# Patient Record
Sex: Female | Born: 1960 | Race: Black or African American | Hispanic: No | Marital: Single | Smoking: Former smoker
Health system: Southern US, Community
[De-identification: ages and names within clinical notes are randomized; demographics above are authoritative.]

## PROBLEM LIST (undated history)

## (undated) DIAGNOSIS — M549 Dorsalgia, unspecified: Secondary | ICD-10-CM

## (undated) DIAGNOSIS — G8929 Other chronic pain: Secondary | ICD-10-CM

## (undated) DIAGNOSIS — F1910 Other psychoactive substance abuse, uncomplicated: Secondary | ICD-10-CM

## (undated) DIAGNOSIS — E669 Obesity, unspecified: Secondary | ICD-10-CM

## (undated) DIAGNOSIS — R202 Paresthesia of skin: Secondary | ICD-10-CM

## (undated) DIAGNOSIS — F419 Anxiety disorder, unspecified: Secondary | ICD-10-CM

## (undated) DIAGNOSIS — E785 Hyperlipidemia, unspecified: Secondary | ICD-10-CM

## (undated) DIAGNOSIS — IMO0002 Reserved for concepts with insufficient information to code with codable children: Secondary | ICD-10-CM

## (undated) DIAGNOSIS — D649 Anemia, unspecified: Secondary | ICD-10-CM

## (undated) DIAGNOSIS — F32A Depression, unspecified: Secondary | ICD-10-CM

## (undated) DIAGNOSIS — L309 Dermatitis, unspecified: Secondary | ICD-10-CM

## (undated) DIAGNOSIS — M1990 Unspecified osteoarthritis, unspecified site: Secondary | ICD-10-CM

## (undated) DIAGNOSIS — M1711 Unilateral primary osteoarthritis, right knee: Secondary | ICD-10-CM

## (undated) DIAGNOSIS — F329 Major depressive disorder, single episode, unspecified: Secondary | ICD-10-CM

## (undated) HISTORY — DX: Reserved for concepts with insufficient information to code with codable children: IMO0002

## (undated) HISTORY — PX: ANKLE FRACTURE SURGERY: SHX122

## (undated) HISTORY — DX: Unspecified osteoarthritis, unspecified site: M19.90

## (undated) HISTORY — DX: Obesity, unspecified: E66.9

## (undated) HISTORY — DX: Major depressive disorder, single episode, unspecified: F32.9

## (undated) HISTORY — DX: Dermatitis, unspecified: L30.9

## (undated) HISTORY — DX: Depression, unspecified: F32.A

## (undated) HISTORY — DX: Anemia, unspecified: D64.9

## (undated) HISTORY — DX: Anxiety disorder, unspecified: F41.9

## (undated) HISTORY — DX: Dorsalgia, unspecified: M54.9

## (undated) HISTORY — DX: Hyperlipidemia, unspecified: E78.5

## (undated) HISTORY — DX: Other chronic pain: G89.29

## (undated) HISTORY — DX: Other psychoactive substance abuse, uncomplicated: F19.10

---

## 1998-02-12 ENCOUNTER — Ambulatory Visit (HOSPITAL_COMMUNITY): Admission: RE | Admit: 1998-02-12 | Discharge: 1998-02-12 | Payer: Self-pay | Admitting: *Deleted

## 1998-07-18 ENCOUNTER — Inpatient Hospital Stay (HOSPITAL_COMMUNITY): Admission: AD | Admit: 1998-07-18 | Discharge: 1998-07-21 | Payer: Self-pay | Admitting: *Deleted

## 1998-11-25 ENCOUNTER — Encounter: Payer: Self-pay | Admitting: Emergency Medicine

## 1998-11-25 ENCOUNTER — Emergency Department (HOSPITAL_COMMUNITY): Admission: EM | Admit: 1998-11-25 | Discharge: 1998-11-25 | Payer: Self-pay | Admitting: Emergency Medicine

## 2002-02-20 ENCOUNTER — Emergency Department (HOSPITAL_COMMUNITY): Admission: EM | Admit: 2002-02-20 | Discharge: 2002-02-20 | Payer: Self-pay | Admitting: Emergency Medicine

## 2002-02-20 ENCOUNTER — Encounter: Payer: Self-pay | Admitting: Emergency Medicine

## 2002-12-19 ENCOUNTER — Other Ambulatory Visit: Admission: RE | Admit: 2002-12-19 | Discharge: 2002-12-19 | Payer: Self-pay | Admitting: Nephrology

## 2003-12-20 ENCOUNTER — Encounter: Admission: RE | Admit: 2003-12-20 | Discharge: 2003-12-20 | Payer: Self-pay | Admitting: Nephrology

## 2004-05-23 ENCOUNTER — Encounter: Admission: RE | Admit: 2004-05-23 | Discharge: 2004-05-23 | Payer: Self-pay | Admitting: Family Medicine

## 2004-05-23 ENCOUNTER — Other Ambulatory Visit: Admission: RE | Admit: 2004-05-23 | Discharge: 2004-05-23 | Payer: Self-pay | Admitting: Family Medicine

## 2004-06-03 ENCOUNTER — Encounter: Admission: RE | Admit: 2004-06-03 | Discharge: 2004-06-03 | Payer: Self-pay | Admitting: Sports Medicine

## 2004-07-30 ENCOUNTER — Emergency Department (HOSPITAL_COMMUNITY): Admission: EM | Admit: 2004-07-30 | Discharge: 2004-07-30 | Payer: Self-pay | Admitting: Emergency Medicine

## 2004-08-01 ENCOUNTER — Ambulatory Visit: Payer: Self-pay | Admitting: Sports Medicine

## 2004-09-10 ENCOUNTER — Ambulatory Visit: Payer: Self-pay | Admitting: Family Medicine

## 2004-09-18 ENCOUNTER — Ambulatory Visit: Payer: Self-pay | Admitting: Family Medicine

## 2004-10-20 ENCOUNTER — Ambulatory Visit: Payer: Self-pay | Admitting: Family Medicine

## 2004-12-12 ENCOUNTER — Ambulatory Visit: Payer: Self-pay | Admitting: Family Medicine

## 2005-01-14 ENCOUNTER — Ambulatory Visit: Payer: Self-pay | Admitting: Family Medicine

## 2005-02-26 ENCOUNTER — Ambulatory Visit: Payer: Self-pay | Admitting: Family Medicine

## 2005-05-12 ENCOUNTER — Encounter (INDEPENDENT_AMBULATORY_CARE_PROVIDER_SITE_OTHER): Payer: Self-pay | Admitting: *Deleted

## 2005-05-12 LAB — CONVERTED CEMR LAB

## 2005-05-13 ENCOUNTER — Ambulatory Visit: Payer: Self-pay | Admitting: Sports Medicine

## 2005-06-10 ENCOUNTER — Other Ambulatory Visit: Admission: RE | Admit: 2005-06-10 | Discharge: 2005-06-10 | Payer: Self-pay | Admitting: Family Medicine

## 2005-06-10 ENCOUNTER — Ambulatory Visit: Payer: Self-pay | Admitting: Family Medicine

## 2005-07-03 ENCOUNTER — Ambulatory Visit: Payer: Self-pay | Admitting: Family Medicine

## 2005-08-05 ENCOUNTER — Ambulatory Visit: Payer: Self-pay | Admitting: Family Medicine

## 2005-08-19 ENCOUNTER — Encounter: Admission: RE | Admit: 2005-08-19 | Discharge: 2005-08-19 | Payer: Self-pay | Admitting: Sports Medicine

## 2005-10-10 ENCOUNTER — Emergency Department (HOSPITAL_COMMUNITY): Admission: EM | Admit: 2005-10-10 | Discharge: 2005-10-10 | Payer: Self-pay | Admitting: Family Medicine

## 2005-12-08 ENCOUNTER — Ambulatory Visit: Payer: Self-pay | Admitting: Family Medicine

## 2006-02-10 ENCOUNTER — Encounter: Payer: Self-pay | Admitting: *Deleted

## 2006-03-03 ENCOUNTER — Ambulatory Visit: Payer: Self-pay | Admitting: Family Medicine

## 2006-04-15 ENCOUNTER — Ambulatory Visit: Payer: Self-pay | Admitting: Family Medicine

## 2006-04-26 ENCOUNTER — Ambulatory Visit: Payer: Self-pay | Admitting: Family Medicine

## 2006-05-12 ENCOUNTER — Ambulatory Visit: Payer: Self-pay | Admitting: Family Medicine

## 2006-06-02 ENCOUNTER — Ambulatory Visit: Payer: Self-pay | Admitting: Sports Medicine

## 2006-08-17 ENCOUNTER — Ambulatory Visit: Payer: Self-pay | Admitting: Family Medicine

## 2006-12-09 DIAGNOSIS — D649 Anemia, unspecified: Secondary | ICD-10-CM

## 2006-12-09 DIAGNOSIS — E7800 Pure hypercholesterolemia, unspecified: Secondary | ICD-10-CM

## 2006-12-09 DIAGNOSIS — K649 Unspecified hemorrhoids: Secondary | ICD-10-CM | POA: Insufficient documentation

## 2006-12-10 ENCOUNTER — Encounter (INDEPENDENT_AMBULATORY_CARE_PROVIDER_SITE_OTHER): Payer: Self-pay | Admitting: *Deleted

## 2007-02-11 ENCOUNTER — Emergency Department (HOSPITAL_COMMUNITY): Admission: EM | Admit: 2007-02-11 | Discharge: 2007-02-11 | Payer: Self-pay | Admitting: Emergency Medicine

## 2007-03-10 ENCOUNTER — Ambulatory Visit: Payer: Self-pay | Admitting: Family Medicine

## 2007-03-10 ENCOUNTER — Other Ambulatory Visit: Admission: RE | Admit: 2007-03-10 | Discharge: 2007-03-10 | Payer: Self-pay | Admitting: Family Medicine

## 2007-03-10 ENCOUNTER — Encounter (INDEPENDENT_AMBULATORY_CARE_PROVIDER_SITE_OTHER): Payer: Self-pay | Admitting: Family Medicine

## 2007-03-10 DIAGNOSIS — F32A Depression, unspecified: Secondary | ICD-10-CM | POA: Insufficient documentation

## 2007-03-10 DIAGNOSIS — F329 Major depressive disorder, single episode, unspecified: Secondary | ICD-10-CM

## 2007-03-14 ENCOUNTER — Encounter (INDEPENDENT_AMBULATORY_CARE_PROVIDER_SITE_OTHER): Payer: Self-pay | Admitting: Family Medicine

## 2007-03-14 ENCOUNTER — Ambulatory Visit: Payer: Self-pay | Admitting: *Deleted

## 2007-03-14 LAB — CONVERTED CEMR LAB
Cholesterol: 191 mg/dL (ref 0–200)
LDL Cholesterol: 120 mg/dL — ABNORMAL HIGH (ref 0–99)
VLDL: 19 mg/dL (ref 0–40)

## 2007-03-22 ENCOUNTER — Encounter (INDEPENDENT_AMBULATORY_CARE_PROVIDER_SITE_OTHER): Payer: Self-pay | Admitting: Family Medicine

## 2007-05-16 ENCOUNTER — Telehealth: Payer: Self-pay | Admitting: *Deleted

## 2007-05-17 ENCOUNTER — Ambulatory Visit: Payer: Self-pay | Admitting: Family Medicine

## 2007-08-24 ENCOUNTER — Ambulatory Visit: Payer: Self-pay | Admitting: Family Medicine

## 2007-08-24 ENCOUNTER — Telehealth: Payer: Self-pay | Admitting: *Deleted

## 2007-08-25 ENCOUNTER — Encounter (INDEPENDENT_AMBULATORY_CARE_PROVIDER_SITE_OTHER): Payer: Self-pay | Admitting: *Deleted

## 2007-08-31 ENCOUNTER — Telehealth: Payer: Self-pay | Admitting: *Deleted

## 2008-02-06 ENCOUNTER — Telehealth: Payer: Self-pay | Admitting: *Deleted

## 2008-02-25 ENCOUNTER — Emergency Department (HOSPITAL_COMMUNITY): Admission: EM | Admit: 2008-02-25 | Discharge: 2008-02-25 | Payer: Self-pay | Admitting: Emergency Medicine

## 2008-02-27 ENCOUNTER — Encounter (INDEPENDENT_AMBULATORY_CARE_PROVIDER_SITE_OTHER): Payer: Self-pay | Admitting: Family Medicine

## 2008-02-27 ENCOUNTER — Ambulatory Visit: Payer: Self-pay | Admitting: Family Medicine

## 2008-02-28 ENCOUNTER — Telehealth (INDEPENDENT_AMBULATORY_CARE_PROVIDER_SITE_OTHER): Payer: Self-pay | Admitting: *Deleted

## 2008-02-28 ENCOUNTER — Encounter: Admission: RE | Admit: 2008-02-28 | Discharge: 2008-02-28 | Payer: Self-pay | Admitting: Family Medicine

## 2008-03-01 ENCOUNTER — Telehealth (INDEPENDENT_AMBULATORY_CARE_PROVIDER_SITE_OTHER): Payer: Self-pay | Admitting: Family Medicine

## 2008-03-06 ENCOUNTER — Telehealth (INDEPENDENT_AMBULATORY_CARE_PROVIDER_SITE_OTHER): Payer: Self-pay | Admitting: Family Medicine

## 2008-03-29 ENCOUNTER — Encounter: Payer: Self-pay | Admitting: *Deleted

## 2008-03-29 ENCOUNTER — Encounter (INDEPENDENT_AMBULATORY_CARE_PROVIDER_SITE_OTHER): Payer: Self-pay | Admitting: Family Medicine

## 2008-03-29 ENCOUNTER — Ambulatory Visit: Payer: Self-pay | Admitting: Family Medicine

## 2008-04-02 ENCOUNTER — Encounter (INDEPENDENT_AMBULATORY_CARE_PROVIDER_SITE_OTHER): Payer: Self-pay | Admitting: *Deleted

## 2008-04-02 ENCOUNTER — Telehealth: Payer: Self-pay | Admitting: *Deleted

## 2008-04-02 LAB — CONVERTED CEMR LAB
Ferritin: 8 ng/mL — ABNORMAL LOW (ref 10–291)
HCT: 34.7 % — ABNORMAL LOW (ref 36.0–46.0)
MCHC: 29.1 g/dL — ABNORMAL LOW (ref 30.0–36.0)
Platelets: 387 10*3/uL (ref 150–400)
RBC: 4.44 M/uL (ref 3.87–5.11)
TIBC: 393 ug/dL (ref 250–470)

## 2008-05-03 ENCOUNTER — Telehealth: Payer: Self-pay | Admitting: *Deleted

## 2008-05-10 ENCOUNTER — Other Ambulatory Visit: Admission: RE | Admit: 2008-05-10 | Discharge: 2008-05-10 | Payer: Self-pay | Admitting: Obstetrics and Gynecology

## 2008-05-10 ENCOUNTER — Encounter (INDEPENDENT_AMBULATORY_CARE_PROVIDER_SITE_OTHER): Payer: Self-pay | Admitting: Family Medicine

## 2008-05-10 ENCOUNTER — Ambulatory Visit: Payer: Self-pay | Admitting: Obstetrics and Gynecology

## 2008-05-18 ENCOUNTER — Ambulatory Visit: Payer: Self-pay | Admitting: Family Medicine

## 2008-05-18 DIAGNOSIS — L309 Dermatitis, unspecified: Secondary | ICD-10-CM | POA: Insufficient documentation

## 2008-05-25 ENCOUNTER — Ambulatory Visit: Payer: Self-pay | Admitting: Obstetrics & Gynecology

## 2008-05-25 ENCOUNTER — Encounter (INDEPENDENT_AMBULATORY_CARE_PROVIDER_SITE_OTHER): Payer: Self-pay | Admitting: Family Medicine

## 2008-05-30 ENCOUNTER — Ambulatory Visit: Payer: Self-pay | Admitting: Family Medicine

## 2008-05-30 ENCOUNTER — Encounter (INDEPENDENT_AMBULATORY_CARE_PROVIDER_SITE_OTHER): Payer: Self-pay | Admitting: Family Medicine

## 2008-06-01 ENCOUNTER — Encounter (INDEPENDENT_AMBULATORY_CARE_PROVIDER_SITE_OTHER): Payer: Self-pay | Admitting: Family Medicine

## 2008-06-01 LAB — CONVERTED CEMR LAB
CO2: 21 meq/L (ref 19–32)
Calcium: 8.3 mg/dL — ABNORMAL LOW (ref 8.4–10.5)
Chloride: 108 meq/L (ref 96–112)
Cholesterol: 180 mg/dL (ref 0–200)
Creatinine, Ser: 0.52 mg/dL (ref 0.40–1.20)
HDL: 43 mg/dL (ref >39)
LDL Cholesterol: 122 mg/dL — ABNORMAL HIGH (ref 0–99)
Platelets: 305 10*3/uL (ref 150–400)
Potassium: 4.7 meq/L (ref 3.5–5.3)
Sodium: 139 meq/L (ref 135–145)
Triglycerides: 73 mg/dL (ref <150)
WBC: 4.9 10*3/uL (ref 4.0–10.5)

## 2008-08-31 ENCOUNTER — Ambulatory Visit: Payer: Self-pay | Admitting: Obstetrics & Gynecology

## 2008-08-31 ENCOUNTER — Encounter (INDEPENDENT_AMBULATORY_CARE_PROVIDER_SITE_OTHER): Payer: Self-pay | Admitting: Obstetrics & Gynecology

## 2008-09-25 ENCOUNTER — Ambulatory Visit: Payer: Self-pay | Admitting: Family Medicine

## 2008-10-18 ENCOUNTER — Telehealth: Payer: Self-pay | Admitting: *Deleted

## 2008-10-25 ENCOUNTER — Encounter (INDEPENDENT_AMBULATORY_CARE_PROVIDER_SITE_OTHER): Payer: Self-pay | Admitting: Family Medicine

## 2008-10-25 ENCOUNTER — Ambulatory Visit: Payer: Self-pay | Admitting: Family Medicine

## 2008-10-25 LAB — CONVERTED CEMR LAB: TSH: 1.303 microintl units/mL (ref 0.350–4.50)

## 2008-11-01 ENCOUNTER — Ambulatory Visit: Payer: Self-pay | Admitting: Obstetrics and Gynecology

## 2008-11-12 ENCOUNTER — Telehealth: Payer: Self-pay | Admitting: *Deleted

## 2008-11-13 ENCOUNTER — Encounter: Payer: Self-pay | Admitting: *Deleted

## 2008-11-14 ENCOUNTER — Ambulatory Visit: Payer: Self-pay | Admitting: Family Medicine

## 2008-11-14 DIAGNOSIS — F411 Generalized anxiety disorder: Secondary | ICD-10-CM | POA: Insufficient documentation

## 2008-12-11 ENCOUNTER — Telehealth: Payer: Self-pay | Admitting: *Deleted

## 2008-12-18 ENCOUNTER — Ambulatory Visit: Payer: Self-pay | Admitting: Obstetrics & Gynecology

## 2008-12-18 ENCOUNTER — Ambulatory Visit (HOSPITAL_COMMUNITY): Admission: RE | Admit: 2008-12-18 | Discharge: 2008-12-18 | Payer: Self-pay | Admitting: Obstetrics & Gynecology

## 2008-12-28 ENCOUNTER — Ambulatory Visit: Payer: Self-pay | Admitting: Family Medicine

## 2008-12-31 ENCOUNTER — Ambulatory Visit: Payer: Self-pay | Admitting: Physician Assistant

## 2008-12-31 ENCOUNTER — Inpatient Hospital Stay (HOSPITAL_COMMUNITY): Admission: AD | Admit: 2008-12-31 | Discharge: 2008-12-31 | Payer: Self-pay | Admitting: Obstetrics & Gynecology

## 2009-01-03 ENCOUNTER — Ambulatory Visit: Payer: Self-pay | Admitting: Family Medicine

## 2009-01-23 ENCOUNTER — Ambulatory Visit: Payer: Self-pay | Admitting: Obstetrics and Gynecology

## 2009-01-30 ENCOUNTER — Telehealth: Payer: Self-pay | Admitting: Family Medicine

## 2009-02-05 ENCOUNTER — Ambulatory Visit: Payer: Self-pay | Admitting: Family Medicine

## 2009-02-12 ENCOUNTER — Ambulatory Visit: Payer: Self-pay | Admitting: Family Medicine

## 2009-02-20 ENCOUNTER — Ambulatory Visit: Payer: Self-pay | Admitting: Obstetrics and Gynecology

## 2009-02-20 LAB — CONVERTED CEMR LAB
HCT: 33 % — ABNORMAL LOW (ref 36.0–46.0)
MCHC: 31.5 g/dL (ref 30.0–36.0)
MCV: 79.5 fL (ref 78.0–100.0)

## 2009-03-18 ENCOUNTER — Encounter (INDEPENDENT_AMBULATORY_CARE_PROVIDER_SITE_OTHER): Payer: Self-pay | Admitting: Family Medicine

## 2009-03-18 ENCOUNTER — Ambulatory Visit: Payer: Self-pay | Admitting: Family Medicine

## 2009-03-18 LAB — CONVERTED CEMR LAB
HCT: 29.7 % — ABNORMAL LOW (ref 36.0–46.0)
Hemoglobin: 9.1 g/dL — ABNORMAL LOW (ref 12.0–15.0)
MCHC: 30.6 g/dL (ref 30.0–36.0)
RBC: 3.81 M/uL — ABNORMAL LOW (ref 3.87–5.11)

## 2009-03-19 ENCOUNTER — Ambulatory Visit: Payer: Self-pay | Admitting: Family Medicine

## 2009-03-21 ENCOUNTER — Encounter: Payer: Self-pay | Admitting: Family Medicine

## 2009-03-21 ENCOUNTER — Inpatient Hospital Stay (HOSPITAL_COMMUNITY): Admission: AD | Admit: 2009-03-21 | Discharge: 2009-03-21 | Payer: Self-pay | Admitting: Obstetrics & Gynecology

## 2009-03-22 ENCOUNTER — Ambulatory Visit: Payer: Self-pay | Admitting: Family Medicine

## 2009-03-22 DIAGNOSIS — K219 Gastro-esophageal reflux disease without esophagitis: Secondary | ICD-10-CM

## 2009-03-28 ENCOUNTER — Encounter (INDEPENDENT_AMBULATORY_CARE_PROVIDER_SITE_OTHER): Payer: Self-pay | Admitting: Family Medicine

## 2009-03-29 ENCOUNTER — Ambulatory Visit: Payer: Self-pay | Admitting: Obstetrics & Gynecology

## 2009-04-18 ENCOUNTER — Ambulatory Visit: Payer: Self-pay | Admitting: Obstetrics & Gynecology

## 2009-04-18 ENCOUNTER — Other Ambulatory Visit: Admission: RE | Admit: 2009-04-18 | Discharge: 2009-04-18 | Payer: Self-pay | Admitting: Obstetrics & Gynecology

## 2009-04-24 ENCOUNTER — Ambulatory Visit (HOSPITAL_COMMUNITY): Admission: RE | Admit: 2009-04-24 | Discharge: 2009-04-24 | Payer: Self-pay | Admitting: Family Medicine

## 2009-04-30 ENCOUNTER — Ambulatory Visit: Payer: Self-pay | Admitting: Family Medicine

## 2009-05-02 ENCOUNTER — Ambulatory Visit: Payer: Self-pay | Admitting: Obstetrics and Gynecology

## 2009-05-09 ENCOUNTER — Ambulatory Visit: Payer: Self-pay | Admitting: Family Medicine

## 2009-05-13 ENCOUNTER — Ambulatory Visit: Payer: Self-pay | Admitting: Family Medicine

## 2009-05-16 ENCOUNTER — Ambulatory Visit: Payer: Self-pay | Admitting: Obstetrics & Gynecology

## 2009-05-21 ENCOUNTER — Ambulatory Visit: Payer: Self-pay | Admitting: Family Medicine

## 2009-06-19 ENCOUNTER — Ambulatory Visit: Payer: Self-pay | Admitting: Family Medicine

## 2009-07-23 ENCOUNTER — Ambulatory Visit: Payer: Self-pay | Admitting: Family Medicine

## 2009-07-23 ENCOUNTER — Encounter: Payer: Self-pay | Admitting: Family Medicine

## 2009-07-23 LAB — CONVERTED CEMR LAB
MCV: 80 fL (ref 78.0–100.0)
Platelets: 404 10*3/uL — ABNORMAL HIGH (ref 150–400)
RBC: 4.3 M/uL (ref 3.87–5.11)

## 2009-07-25 ENCOUNTER — Telehealth: Payer: Self-pay | Admitting: *Deleted

## 2009-08-15 ENCOUNTER — Ambulatory Visit: Payer: Self-pay | Admitting: Obstetrics & Gynecology

## 2009-08-20 ENCOUNTER — Ambulatory Visit (HOSPITAL_COMMUNITY): Admission: RE | Admit: 2009-08-20 | Discharge: 2009-08-20 | Payer: Self-pay | Admitting: Family Medicine

## 2009-09-12 ENCOUNTER — Ambulatory Visit: Payer: Self-pay | Admitting: Obstetrics & Gynecology

## 2009-10-03 ENCOUNTER — Ambulatory Visit (HOSPITAL_COMMUNITY): Admission: RE | Admit: 2009-10-03 | Discharge: 2009-10-03 | Payer: Self-pay | Admitting: Obstetrics & Gynecology

## 2009-10-03 ENCOUNTER — Ambulatory Visit: Payer: Self-pay | Admitting: Obstetrics & Gynecology

## 2009-10-12 HISTORY — PX: ABDOMINAL HYSTERECTOMY: SHX81

## 2009-12-06 ENCOUNTER — Ambulatory Visit: Payer: Self-pay | Admitting: Obstetrics & Gynecology

## 2009-12-11 ENCOUNTER — Ambulatory Visit: Payer: Self-pay | Admitting: Family Medicine

## 2009-12-11 ENCOUNTER — Encounter: Payer: Self-pay | Admitting: Family Medicine

## 2009-12-11 LAB — CONVERTED CEMR LAB
Hemoglobin: 12.2 g/dL (ref 12.0–15.0)
Iron: 36 ug/dL — ABNORMAL LOW (ref 42–145)
MCHC: 31.3 g/dL (ref 30.0–36.0)
MCV: 84.2 fL (ref 78.0–100.0)
Platelets: 328 10*3/uL (ref 150–400)
RDW: 17 % — ABNORMAL HIGH (ref 11.5–15.5)
Saturation Ratios: 11 % — ABNORMAL LOW (ref 20–55)
TIBC: 318 ug/dL (ref 250–470)
UIBC: 282 ug/dL
WBC: 5.7 10*3/uL (ref 4.0–10.5)

## 2009-12-13 ENCOUNTER — Telehealth: Payer: Self-pay | Admitting: *Deleted

## 2010-01-02 ENCOUNTER — Telehealth: Payer: Self-pay | Admitting: Family Medicine

## 2010-01-02 ENCOUNTER — Emergency Department (HOSPITAL_COMMUNITY): Admission: EM | Admit: 2010-01-02 | Discharge: 2010-01-02 | Payer: Self-pay | Admitting: Emergency Medicine

## 2010-01-08 ENCOUNTER — Ambulatory Visit: Payer: Self-pay | Admitting: Family Medicine

## 2010-01-08 ENCOUNTER — Encounter: Admission: RE | Admit: 2010-01-08 | Discharge: 2010-01-08 | Payer: Self-pay | Admitting: Family Medicine

## 2010-01-09 ENCOUNTER — Telehealth: Payer: Self-pay | Admitting: Family Medicine

## 2010-01-09 ENCOUNTER — Encounter: Payer: Self-pay | Admitting: *Deleted

## 2010-01-09 ENCOUNTER — Telehealth: Payer: Self-pay | Admitting: *Deleted

## 2010-01-17 ENCOUNTER — Telehealth: Payer: Self-pay | Admitting: Family Medicine

## 2010-02-07 ENCOUNTER — Ambulatory Visit: Payer: Self-pay | Admitting: Obstetrics & Gynecology

## 2010-02-10 ENCOUNTER — Telehealth: Payer: Self-pay | Admitting: Family Medicine

## 2010-02-12 ENCOUNTER — Ambulatory Visit (HOSPITAL_COMMUNITY): Admission: RE | Admit: 2010-02-12 | Discharge: 2010-02-12 | Payer: Self-pay | Admitting: Obstetrics & Gynecology

## 2010-02-18 ENCOUNTER — Telehealth: Payer: Self-pay | Admitting: Family Medicine

## 2010-02-27 ENCOUNTER — Ambulatory Visit: Payer: Self-pay | Admitting: Obstetrics & Gynecology

## 2010-03-04 ENCOUNTER — Telehealth: Payer: Self-pay | Admitting: Family Medicine

## 2010-03-05 ENCOUNTER — Ambulatory Visit: Payer: Self-pay | Admitting: Family Medicine

## 2010-03-12 ENCOUNTER — Ambulatory Visit: Payer: Self-pay | Admitting: Family Medicine

## 2010-03-13 ENCOUNTER — Ambulatory Visit (HOSPITAL_COMMUNITY): Admission: RE | Admit: 2010-03-13 | Discharge: 2010-03-13 | Payer: Self-pay | Admitting: Obstetrics & Gynecology

## 2010-03-31 ENCOUNTER — Ambulatory Visit (HOSPITAL_COMMUNITY): Admission: RE | Admit: 2010-03-31 | Discharge: 2010-03-31 | Payer: Self-pay | Admitting: Family Medicine

## 2010-03-31 ENCOUNTER — Ambulatory Visit: Payer: Self-pay | Admitting: Family Medicine

## 2010-04-04 ENCOUNTER — Encounter: Payer: Self-pay | Admitting: Family Medicine

## 2010-05-09 ENCOUNTER — Ambulatory Visit: Payer: Self-pay | Admitting: Obstetrics and Gynecology

## 2010-05-12 ENCOUNTER — Telehealth: Payer: Self-pay | Admitting: *Deleted

## 2010-06-03 ENCOUNTER — Other Ambulatory Visit: Payer: Self-pay | Admitting: Obstetrics & Gynecology

## 2010-06-03 ENCOUNTER — Ambulatory Visit (HOSPITAL_COMMUNITY): Admission: RE | Admit: 2010-06-03 | Discharge: 2010-06-03 | Payer: Self-pay | Admitting: Obstetrics & Gynecology

## 2010-06-10 ENCOUNTER — Ambulatory Visit: Payer: Self-pay | Admitting: Obstetrics & Gynecology

## 2010-06-10 ENCOUNTER — Encounter: Payer: Self-pay | Admitting: Obstetrics & Gynecology

## 2010-06-10 ENCOUNTER — Inpatient Hospital Stay (HOSPITAL_COMMUNITY): Admission: RE | Admit: 2010-06-10 | Discharge: 2010-06-11 | Payer: Self-pay | Admitting: Obstetrics & Gynecology

## 2010-06-10 ENCOUNTER — Encounter: Payer: Self-pay | Admitting: Family Medicine

## 2010-06-19 ENCOUNTER — Inpatient Hospital Stay (HOSPITAL_COMMUNITY): Admission: AD | Admit: 2010-06-19 | Discharge: 2010-06-19 | Payer: Self-pay | Admitting: Obstetrics & Gynecology

## 2010-07-03 ENCOUNTER — Ambulatory Visit: Payer: Self-pay | Admitting: Obstetrics & Gynecology

## 2010-07-04 ENCOUNTER — Encounter: Payer: Self-pay | Admitting: Obstetrics & Gynecology

## 2010-07-24 ENCOUNTER — Ambulatory Visit: Payer: Self-pay | Admitting: Obstetrics and Gynecology

## 2010-08-29 ENCOUNTER — Ambulatory Visit: Payer: Self-pay | Admitting: Family Medicine

## 2010-08-29 DIAGNOSIS — H9209 Otalgia, unspecified ear: Secondary | ICD-10-CM | POA: Insufficient documentation

## 2010-09-02 DIAGNOSIS — R109 Unspecified abdominal pain: Secondary | ICD-10-CM | POA: Insufficient documentation

## 2010-11-02 ENCOUNTER — Encounter: Payer: Self-pay | Admitting: Obstetrics & Gynecology

## 2010-11-13 NOTE — Progress Notes (Signed)
Summary: pt request GYN referral/TS   Phone Note Call from Patient Call back at Home Phone [phone]   Caller: Patient Summary of Call: Pt returning phone call from Dr. Jeanice Lim. Initial call taken by: Clydell Hakim,  January 09, 2010 4:07 PM  Follow-up for Phone Call        called pt and explained results of CT scan. pt wants to go to Gyn doctor and discuss hysterectomy. c/o abdominal bloating, bleeding and feels like she needs hysterectomy. I told the pt, that we would fax the information to [facility] and they will sched. appt for her. pt agreed. fwd. to Dr.[place] to put in order for GYN referral. Follow-up by: Arlyss Repress CMA,,  January 10, 2010 9:15 AM  Additional Follow-up for Phone Call Additional follow up Details #1::        pt does not need a gyn referral is [facility], she just needs to call for an appt Additional Follow-up by: [name] MD,  January 10, 2010 9:19 AM    Additional Follow-up for Phone Call Additional follow up Details #2::    called pt and lmvm to call [facility] and sched. appt. see dr.'s note. Follow-up by: Arlyss Repress CMA,,  January 10, 2010 11:50 AM  Additional Follow-up for Phone Call Additional follow up Details #3:: Details for Additional Follow-up Action Taken: called pt. pt has upcoming appt at [facility] on 02-07-10. Additional Follow-up by: Arlyss Repress CMA,,  January 13, 2010 10:53 AM

## 2010-11-13 NOTE — Assessment & Plan Note (Signed)
Summary: BACK PAIN [facility]   Vital Signs:  Patient profile:   50 year old female Weight:      258 pounds BMI:     49.74 BP sitting:   124 / 82  (right arm)  Vitals Entered By: Starleen Blue RN (December 11, 2009 8:37 AM) CC: back pain/ anemia   Primary Care Provider:  [name] MD  CC:  back pain/ anemia.  History of Present Illness:   50y.o female , presents with chronic low back and abd pain.   Back pain/and pain - pt with well known history of uterine fibroids and DUB, s/p endometrial ablation in March 2010 and in Dec 2010 with removal of submucasal fibroid  . Continues to have heavy, painful menstrual cycles monthly with bloating. Abd pain decribed as crampy pain mostly in lower quadrants which occurs during menstruation, no radiation however with associated backache. Pain pain is more of side pain below her pannus. Ibuprofen helps mediate most symptoms , has also tried Excedrin with little releif. Pt described epsiodes to GYN doctor but they told her to come back here. At this time they are to hold on TAH  ROS- denies dysuria, hematuria, frequency, N/V, diarrhea , no paresthesia of lower ext, no change in bladder function LMP Week of Feb 21   Habits & Providers  Alcohol-Tobacco-Diet     Tobacco Status: current  Allergies: No Known Drug Allergies  Social History: Smoking Status:  current  Review of Systems       per HPI  Physical Exam  General:  Well-developed,obese,in no acute distress; alert,appropriate and cooperative throughout examination.  Vitals reviewed.  Abdomen:  +BS, Soft, NT, non-distended, obese, no mass felt however pt with large amount of adipose tissue  No CVA tenderness  Msk:  TTP beneath pannus on left and right sides/ lateral aspect spine non tender no redness normal squal, pulling sensation with flexion at back 45degrees from standing No pain with inveriosn or eversion or hip right or left neg straight leg test Hip at same height no pain  with palpation of iliac crest Neurologic:  Gait normal Motor equal bilat normal sensation DTR lower ext equal bilat   Impression & Recommendations:  Problem # 1:  BACK PAIN, CHRONIC (ICD-724.5) Assessment Unchanged  I beleive this is menorrhagia associated with uterine fibrods as well as some MSK complaint, no red flag on exam. As this has been going on for years . Will change NSAID and given handout for exercises. Note some areas where pt complains are below her pannus, also encouraged weight loss for this Her updated medication list for this problem includes:    Meloxicam 7.5 Mg Tabs (Meloxicam) .[name] Kitchen... 1 by mouth daily x 6 weeks  , take with food  dispense qs  Orders: [facility]- Est  Level 4 ([address])  Problem # 2:  MENORRHAGIA (ICD-626.2) Assessment: Unchanged  s/p abalation and fibroid removal, supprotive care, see [facility] notes  Orders: [facility]- Est  Level 4 ([address])  Problem # 3:  ANEMIA, OTHER, UNSPECIFIED (ICD-285.9) Assessment: Unchanged Check labs today, continue iron Her updated medication list for this problem includes:    Ferrous Sulfate 325 (65 Fe) Mg Tabs (Ferrous sulfate) ..... One tablet by mouth two times a day for anemia  Orders: CBC-[facility] ([address]) Ferritin-[facility] ([address]) Iron -[facility] ([address]) [facility]- Est  Level 4 ([address])  Complete Medication List: 1)  Triamcinolone Acetonide 0.1 % Crea (Triamcinolone acetonide) .... Apply to affected area two times a day  disp: 60 g  2)  Ferrous Sulfate 325 (65 Fe) Mg Tabs (Ferrous sulfate) .... One tablet by mouth two times a day for anemia 3)  Meloxicam 7.5 Mg Tabs (Meloxicam) .[name] Kitchen.. 1 by mouth daily x 6 weeks  , take with food  dispense qs  Patient Instructions: 1)  [facility] and ask if your PAP smear was done 2)  If not make an appt for your PAP smear 3)  You also need a mammogram 4)  For your back/side pain, I beleive this is musculoskeletal and will get better with weight loss,exercise and daily anti-inflammtory  medication 5)  Return in 2-3 months to f/u on your back pain after giving the above a try. 6)  I will call you with your lab results Prescriptions: TRIAMCINOLONE ACETONIDE 0.1 %  CREA (TRIAMCINOLONE ACETONIDE) apply to affected area two times a day  disp: 60 g  #1 x 3   Entered and Authorized by:   [name] MD   Signed by:   [name] MD on 12/11/2009   Method used:   Electronically to        [facility] (retail)       [address]       [place], [place]  [address]       Ph: [phone]       Fax: [phone]   RxID:   [id number] MELOXICAM 7.5 MG TABS (MELOXICAM) 1 by mouth daily x 6 weeks  , Take with food  Dispense QS  #1 x 0   Entered and Authorized by:   [name] MD   Signed by:   [name] MD on 12/11/2009   Method used:   Electronically to        [facility] (retail)       [address]       [place], [place]  [address]       Ph: [phone]       Fax: [phone]   RxID:   [id number]
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
## 2010-11-13 NOTE — Letter (Signed)
Summary: Generic Letter  [facility]  [address]   [place], [place] [address]   Phone: [phone]  Fax: [phone]    06/10/2010  April [name] [address] [place], [place]  [address]  06/10/2010  April [name] [address] [place], [place]  [address]  Dear [facility],  OB/GYN    [name] has been cleared for GYN surgery, with a low cardiac risk assessment. Enclosed his her EKG.    Sincerely,   [name] MD  Appended Document: Generic Letter letter and EKG faxed.

## 2010-11-13 NOTE — Assessment & Plan Note (Signed)
Summary: EKG/KH  EKG performed for Pre Op Clearance as requested by MD.  EKG placed in MD. . Theresia Lo RN  March 31, 2010 9:00 AM   Nurse Visit   Allergies: No Known Drug Allergies  Orders Added: 1)  EKG- [facility] [EKG] 2)  Est Level 1- [facility] [[address]]

## 2010-11-13 NOTE — Progress Notes (Signed)
Summary: Rx Req   Phone Note Refill Request Call back at [facility] Phone [phone] Message from:  Patient  Refills Requested: Medication #1:  ULTRAM 50 MG TABS 1 by mouth Q 6hrs as needed pain. [facility].  Initial call taken by: Clydell Hakim,  January 17, 2010 8:47 AM    Prescriptions: ULTRAM 50 MG TABS (TRAMADOL HCL) 1 by mouth Q 6hrs as needed pain  #40 x 0   Entered and Authorized by:   [name] MD   Signed by:   [name] MD on 01/17/2010   Method used:   Electronically to        [facility] (retail)       [address]       [place], [place]  [id number]       Ph: [phone]       Fax: [phone]   RxID:   [id number]
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
## 2010-11-13 NOTE — Progress Notes (Signed)
Summary: CT Res   Phone Note Call from Patient Call back at [phone]   Caller: Patient Summary of Call: Pt had CT done yesterday and wondering about results. Initial call taken by: Clydell Hakim,  January 09, 2010 10:00 AM  Follow-up for Phone Call        will forward to PCP. Follow-up by: Theresia Lo RN,  January 09, 2010 11:02 AM  Additional Follow-up for Phone Call Additional follow up Details #1::        Attempted to call pt, no acute process on CT SCAN, except prominent uterus from fibroids, everything else normal. Pt can call to reschedule with GYN at [facility] if she would like to consider Hysterectomy Left message for pt to return call Additional Follow-up by: [name] MD,  January 09, 2010 3:37 PM

## 2010-11-13 NOTE — Miscellaneous (Signed)
Summary: CT approved   Clinical Lists Changes CT abd & pelvis approved #[id number].[place]en Circle RN  January 09, 2010 9:02 AM

## 2010-11-13 NOTE — Progress Notes (Signed)
Summary: faxed info to [facility]   Phone Note Call from Patient Call back at Home Phone [phone]   Caller: Patient Summary of Call: Pt to have surgery on 30th of August and [facility] are waiting on notes from [place].  Initial call taken by: Clydell Hakim,  May 12, 2010 4:11 PM  Follow-up for Phone Call        faxed info to [facility]. called pt. pt was not at home. Follow-up by: Arlyss Repress CMA,,  May 12, 2010 4:38 PM

## 2010-11-13 NOTE — Progress Notes (Signed)
Summary: called pt/fyi/ts   Phone Note Call from Patient Call back at Work Phone [phone] Call back at ext [phone]   Caller: Patient Summary of Call: Pt needs to talk to Dr. Jeanice Lim about what is going on with her.  She says everytime she goes to [facility] she is seeing a different doctor and getting more tests and still no decision about what is to be done. Initial call taken by: Clydell Hakim,  Feb 10, 2010 11:20 AM  Follow-up for Phone Call        called pt and informed of report...clinic note 02-07-10. she does not want to re-peat a endometrial biopsy.also, tried iud in past and did not tolerate it. is very frustrated. she is not bleeding now. i advised the pt to sched. ov with dr.Dundarrach to discuss issue and to keep her upcoming appt at [facility]. pt agreed. fwd. to dr.[place] for review. Follow-up by: Arlyss Repress CMA,,  Feb 10, 2010 2:12 PM

## 2010-11-13 NOTE — Progress Notes (Signed)
Summary: Rx Req   Phone Note Refill Request Call back at [facility] Phone [phone] Message from:  Patient  Refills Requested: Medication #1:  ULTRAM 50 MG TABS 1 by mouth Q 6hrs as needed pain. [facility].  Initial call taken by: Clydell Hakim,  Feb 18, 2010 1:59 PM    Prescriptions: ULTRAM 50 MG TABS (TRAMADOL HCL) 1 by mouth Q 6hrs as needed pain  #40 x 0   Entered and Authorized by:   [name] MD   Signed by:   [name] MD on 02/19/2010   Method used:   Electronically to        [facility] (retail)       [address]       [place], [place]  [address]       Ph: [phone]       Fax: [phone]   RxID:   [id number]

## 2010-11-13 NOTE — Assessment & Plan Note (Signed)
Summary: h/fup,tcb   Vital Signs:  Patient profile:   50 year old female Height:      60.5 inches Weight:      263 pounds BMI:     50.70 Temp:     97.8 degrees F oral Pulse rate:   72 / minute BP sitting:   130 / 84  Vitals Entered By: Tessie Fass CMA (January 08, 2010 1:35 PM) CC: h f/u Is Patient Diabetic? No Pain Assessment Patient in pain? yes     Location: abdomen Intensity: 8   Primary Care Provider:  [name] MD  CC:  h f/u.  History of Present Illness:   Pt seen by EDP 01/02/10 with increased abdominal pain, pain was 8/10 and radating across the abdomen. Pain normally worse with LMP however at that time no menstruation with known fibroid Uterus. Since Thursday has been taking Ibuprofen twice a day for pain. Currently has  abdominal pain with bending and standing, desribed as a bloating uncomfortale feeling across her abdomen non radiating. "Feels  like her stomach is getting larger and swelling" Upreg, and urine studies negative during ED visit. Pt sent for outpatient CT abd/pelvis   ROS- no fever, + nausea no emesis, loose stools 2 x per week, no blood in stools, no pain with by mouth intake, no muscle aches, no dysuria, no discharge    Habits & Providers  Alcohol-Tobacco-Diet     Tobacco Status: quit  Current Medications (verified): 1)  Triamcinolone Acetonide 0.1 %  Crea (Triamcinolone Acetonide) .... Apply To Affected Area Two Times A Day  Disp: 60 G 2)  Ferrous Sulfate 325 (65 Fe) Mg Tabs (Ferrous Sulfate) .... One Tablet By Mouth Two Times A Day For Anemia 3)  Ultram 50 Mg Tabs (Tramadol Hcl) .[name] Kitchen.. 1 By Mouth Q 6hrs As Needed Pain  Allergies (verified): No Known Drug Allergies  Social History: Smoking Status:  quit  Physical Exam  General:  Well-developed,obese,in no acute distress; alert,appropriate and cooperative throughout examination.  Vitals reviewed.  Abdomen:  +BS, Soft, NT, non-distended, obese, no mass felt however pt with large  amount of adipose tissue no hepatomegaly, neg murphy sign,   No CVA tenderness    Impression & Recommendations:  Problem # 1:  ABDOMINAL PAIN (ICD-789.00) Assessment Deteriorated Pain likley secondary to fibroid uterus however, with increased epsidoes not associated with menses. Secondary to habitus exam limited, but no red flags on exam no acute abdomen, no hepatomegaly, neg bilary signs. Will send for CT, likley enlarging Fibroids Orders: [facility]- Est Level  3 (04540) CT without Contrast (CT w/o contrast)  Problem # 2:  FIBROIDS, UTERUS (ICD-218.9) Assessment: Unchanged Continue iron supplement, CT per above, will likely need gyn again for reconsideration of hysterectomy if scan negative Orders: [facility]- Est Level  3 (98119) CT without Contrast (CT w/o contrast)  Complete Medication List: 1)  Triamcinolone Acetonide 0.1 % Crea (Triamcinolone acetonide) .... Apply to affected area two times a day  disp: 60 g 2)  Ferrous Sulfate 325 (65 Fe) Mg Tabs (Ferrous sulfate) .... One tablet by mouth two times a day for anemia 3)  Ultram 50 Mg Tabs (Tramadol hcl) .[name] Kitchen.. 1 by mouth q 6hrs as needed pain  Patient Instructions: 1)  Continue your iron supplement 2)  Take the other pain pill as needed 3)  I will call you with CT results Prescriptions: ULTRAM 50 MG TABS (TRAMADOL HCL) 1 by mouth Q 6hrs as needed pain  #40 x 0   Entered  and Authorized by:   [name] MD   Signed by:   [name] MD on 01/08/2010   Method used:   Electronically to        [facility] (retail)       [address]       [place], [place]  [address]       Ph: [phone]       Fax: [phone]   RxID:   [id number]

## 2010-11-13 NOTE — Progress Notes (Signed)
Summary: ER follow-up request   Phone Note Other Incoming   Summary of Call: Made follow-up appt for patient at the request of ER.  Patient seen today with benign abdomen and known uterine fibroids.  Will se PCP Dr. Jeanice Lim 3/30 @ 1:30 PM. Initial call taken by: Delbert Harness MD,  January 02, 2010 3:13 PM

## 2010-11-13 NOTE — Progress Notes (Signed)
Summary: triage   Phone Note Call from Patient Call back at Home Phone [phone]   Caller: Patient Summary of Call: needs to talk to nurse about Tramadol - things are not right with her body - needs to talk to nurse Initial call taken by: De Nurse,  Mar 04, 2010 8:50 AM  Follow-up for Phone Call        pain wakes her at night. tramadol helped at first. still taking it but has general body aches. neck is worst area.  has preop next week here. having fibroid surgery in July. wants early am appt tomorrow to see if she can get some relief. she will be here for the 8:30 work in Follow-up by: Golden Circle RN,  Mar 04, 2010 9:11 AM

## 2010-11-13 NOTE — Progress Notes (Signed)
Summary: re: labs/ts  ---- Converted from flag ---- ---- 12/12/2009 5:55 PM, [name] MD wrote: Please let pt know her hemoglobin is normal and her iron counts are improving, continue with the iron supplement ------------------------------  called pt and informed of labs. pt agreed.
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
## 2010-11-28 ENCOUNTER — Ambulatory Visit (INDEPENDENT_AMBULATORY_CARE_PROVIDER_SITE_OTHER): Payer: Medicaid Other | Admitting: Family Medicine

## 2010-11-28 ENCOUNTER — Encounter: Payer: Self-pay | Admitting: Family Medicine

## 2010-11-28 VITALS — BP 123/84 | HR 64 | Wt 262.0 lb

## 2010-11-28 DIAGNOSIS — R252 Cramp and spasm: Secondary | ICD-10-CM

## 2010-11-28 DIAGNOSIS — F411 Generalized anxiety disorder: Secondary | ICD-10-CM

## 2010-11-28 DIAGNOSIS — E669 Obesity, unspecified: Secondary | ICD-10-CM

## 2010-11-28 DIAGNOSIS — L259 Unspecified contact dermatitis, unspecified cause: Secondary | ICD-10-CM

## 2010-11-28 LAB — CONVERTED CEMR LAB
CO2: 19 meq/L (ref 19–32)
Calcium: 8.8 mg/dL (ref 8.4–10.5)
Chloride: 106 meq/L (ref 96–112)
Creatinine, Ser: 0.51 mg/dL (ref 0.40–1.20)
Glucose, Bld: 76 mg/dL (ref 70–99)
Sodium: 139 meq/L (ref 135–145)

## 2010-11-28 LAB — BASIC METABOLIC PANEL
CO2: 19 mEq/L (ref 19–32)
Calcium: 8.8 mg/dL (ref 8.4–10.5)
Creat: 0.51 mg/dL (ref 0.40–1.20)
Glucose, Bld: 76 mg/dL (ref 70–99)

## 2010-11-28 MED ORDER — FLUOXETINE HCL 20 MG PO CAPS: 20.0000 mg | ORAL_CAPSULE | Freq: Every day | ORAL | Status: DC

## 2010-11-28 MED ORDER — TRIAMCINOLONE ACETONIDE 0.1 % EX CREA: TOPICAL_CREAM | Freq: Two times a day (BID) | CUTANEOUS | Status: DC

## 2010-11-28 MED ORDER — CLONAZEPAM 0.5 MG PO TABS: 0.5000 mg | ORAL_TABLET | Freq: Two times a day (BID) | ORAL | Status: AC | PRN

## 2010-11-28 NOTE — Assessment & Plan Note (Signed)
Likely some overuse with exercising, no edema, radicular symptoms, normal exam, check lytes and glucose level

## 2010-11-28 NOTE — Patient Instructions (Signed)
Start the prozac daily, you may not notice a difference for approximately 3 weeks Use the short acting medication, Klonopin as needed for attacks Continue follow-up with your therapist We will call you Monday about your labs Drink plenty of fluids with exercise and stretch to avoid cramping Next visit in 3 weeks

## 2010-11-28 NOTE — Assessment & Plan Note (Signed)
Pt with history of depression and anxiety, now in predominantly nervous state, she is actually coping quite well, and I am impressed she is following so closely with her therapist and working with her family. No red flags, pt contracted for safety. Will start Prozac daily, she has been on multiple medications but often stopped them too early for various reasons, most she cant remember, Short acting clonazepam. RTC 3 weeks
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
## 2010-11-28 NOTE — Assessment & Plan Note (Signed)
Encouraged her weight loss and new exercise routine, will continue to work with pt on this discuss nutrition at next visit. This will also help with her mood

## 2010-11-29 ENCOUNTER — Encounter: Payer: Self-pay | Admitting: Family Medicine

## 2010-12-01 ENCOUNTER — Telehealth: Payer: Self-pay | Admitting: *Deleted

## 2010-12-01 NOTE — Telephone Encounter (Signed)
Message copied by Tessie Fass on Mon Dec 01, 2010  3:42 PM ------      Message from: [name]      Created: Mon Dec 01, 2010 11:39 AM       Please let pt know her labs were normal, this included her potassium, kidney function and blood sugar screen for diabetes.

## 2010-12-01 NOTE — Telephone Encounter (Signed)
Pt returned call, told pt all labs were normal.

## 2010-12-01 NOTE — Telephone Encounter (Signed)
Message copied by Tessie Fass on Mon Dec 01, 2010  3:41 PM ------      Message from: [name]      Created: Mon Dec 01, 2010 11:39 AM       Please let pt know her labs were normal, this included her potassium, kidney function and blood sugar screen for diabetes.

## 2010-12-25 LAB — URINALYSIS, ROUTINE W REFLEX MICROSCOPIC
Bilirubin Urine: NEGATIVE
Glucose, UA: NEGATIVE mg/dL
Ketones, ur: NEGATIVE mg/dL

## 2010-12-26 LAB — CBC
MCH: 29.1 pg (ref 26.0–34.0)
MCV: 86.7 fL (ref 78.0–100.0)
Platelets: 252 10*3/uL (ref 150–400)
Platelets: 285 10*3/uL (ref 150–400)
RBC: 4.48 MIL/uL (ref 3.87–5.11)
RDW: 14.5 % (ref 11.5–15.5)
WBC: 7.2 10*3/uL (ref 4.0–10.5)
WBC: 8.6 10*3/uL (ref 4.0–10.5)

## 2010-12-26 LAB — PREGNANCY, URINE: Preg Test, Ur: NEGATIVE

## 2010-12-26 LAB — SURGICAL PCR SCREEN
MRSA, PCR: NEGATIVE
Staphylococcus aureus: NEGATIVE

## 2011-01-04 LAB — POCT PREGNANCY, URINE: Preg Test, Ur: NEGATIVE

## 2011-01-04 LAB — URINALYSIS, ROUTINE W REFLEX MICROSCOPIC
Bilirubin Urine: NEGATIVE
Nitrite: NEGATIVE
Specific Gravity, Urine: 1.012 (ref 1.005–1.030)
Urobilinogen, UA: 0.2 mg/dL (ref 0.0–1.0)

## 2011-01-04 LAB — URINE MICROSCOPIC-ADD ON

## 2011-01-12 LAB — BASIC METABOLIC PANEL
CO2: 26 mEq/L (ref 19–32)
Calcium: 8.8 mg/dL (ref 8.4–10.5)
Creatinine, Ser: 0.54 mg/dL (ref 0.4–1.2)
GFR calc Af Amer: >60 mL/min (ref >60)

## 2011-01-12 LAB — CBC
MCHC: 32.3 g/dL (ref 30.0–36.0)
RBC: 4.15 MIL/uL (ref 3.87–5.11)

## 2011-01-12 LAB — PREGNANCY, URINE: Preg Test, Ur: NEGATIVE

## 2011-01-19 LAB — CBC
HCT: 29.3 % — ABNORMAL LOW (ref 36.0–46.0)
Platelets: 350 10*3/uL (ref 150–400)
WBC: 5.8 10*3/uL (ref 4.0–10.5)

## 2011-01-19 LAB — GC/CHLAMYDIA PROBE AMP, GENITAL: Chlamydia, DNA Probe: NEGATIVE

## 2011-01-19 LAB — WET PREP, GENITAL: Yeast Wet Prep HPF POC: NONE SEEN

## 2011-01-19 LAB — POCT PREGNANCY, URINE: Preg Test, Ur: NEGATIVE

## 2011-01-22 LAB — COMPREHENSIVE METABOLIC PANEL
AST: 17 U/L (ref 0–37)
Alkaline Phosphatase: 118 U/L — ABNORMAL HIGH (ref 39–117)
BUN: 11 mg/dL (ref 6–23)
CO2: 25 mEq/L (ref 19–32)
Chloride: 107 mEq/L (ref 96–112)
Creatinine, Ser: 0.44 mg/dL (ref 0.4–1.2)
GFR calc Af Amer: >60 mL/min (ref >60)
GFR calc non Af Amer: >60 mL/min (ref >60)
Potassium: 3.7 mEq/L (ref 3.5–5.1)
Total Bilirubin: 0.2 mg/dL — ABNORMAL LOW (ref 0.3–1.2)

## 2011-01-22 LAB — DIFFERENTIAL
Basophils Absolute: 0.1 10*3/uL (ref 0.0–0.1)
Eosinophils Absolute: 0.3 10*3/uL (ref 0.0–0.7)
Lymphs Abs: 2.3 10*3/uL (ref 0.7–4.0)
Monocytes Absolute: 0.7 10*3/uL (ref 0.1–1.0)

## 2011-01-22 LAB — URINALYSIS, ROUTINE W REFLEX MICROSCOPIC
Bilirubin Urine: NEGATIVE
Nitrite: NEGATIVE
Protein, ur: NEGATIVE mg/dL
Specific Gravity, Urine: 1.015 (ref 1.005–1.030)
Urobilinogen, UA: 0.2 mg/dL (ref 0.0–1.0)

## 2011-01-22 LAB — CBC
HCT: 32.1 % — ABNORMAL LOW (ref 36.0–46.0)
HCT: 34.5 % — ABNORMAL LOW (ref 36.0–46.0)
MCHC: 32.8 g/dL (ref 30.0–36.0)
MCV: 82.9 fL (ref 78.0–100.0)
MCV: 83.7 fL (ref 78.0–100.0)
Platelets: 355 10*3/uL (ref 150–400)
RBC: 4.13 MIL/uL (ref 3.87–5.11)
RDW: 15.2 % (ref 11.5–15.5)
WBC: 6.1 10*3/uL (ref 4.0–10.5)

## 2011-01-22 LAB — DIC (DISSEMINATED INTRAVASCULAR COAGULATION)PANEL: D-Dimer, Quant: 0.52 ug/mL-FEU — ABNORMAL HIGH (ref 0.00–0.48)

## 2011-02-17 ENCOUNTER — Encounter: Payer: Self-pay | Admitting: Family Medicine

## 2011-02-17 ENCOUNTER — Ambulatory Visit (INDEPENDENT_AMBULATORY_CARE_PROVIDER_SITE_OTHER): Payer: Medicaid Other | Admitting: Family Medicine

## 2011-02-17 VITALS — BP 110/80 | Temp 98.1°F | Ht 60.5 in | Wt 262.0 lb

## 2011-02-17 DIAGNOSIS — F411 Generalized anxiety disorder: Secondary | ICD-10-CM

## 2011-02-17 DIAGNOSIS — M79609 Pain in unspecified limb: Secondary | ICD-10-CM

## 2011-02-17 DIAGNOSIS — M79673 Pain in unspecified foot: Secondary | ICD-10-CM

## 2011-02-17 DIAGNOSIS — R109 Unspecified abdominal pain: Secondary | ICD-10-CM

## 2011-02-17 DIAGNOSIS — D649 Anemia, unspecified: Secondary | ICD-10-CM

## 2011-02-17 NOTE — Patient Instructions (Signed)
Make an appt with GYN If you have fever, increased pain, vaginal bleeding, vomiting, then come back to be evaluated or go to ER Try the heel cups, ice your heels after walking You can get the heel cups from the medical supply store Next visit in 2 months

## 2011-02-17 NOTE — Assessment & Plan Note (Signed)
Possible spur, but likely from overuse- standing Given script for heel cups to try, see instructions

## 2011-02-17 NOTE — Assessment & Plan Note (Signed)
Pt has chronic pelvic pain even after removal of fibroids from aug of 2011, given reassurance no red flags, she would like to see GYN again, I told her it was resonable but expect there may not be much to reason why she continues to have pain

## 2011-02-17 NOTE — Progress Notes (Signed)
  Subjective:    Patient ID: April [name], female    DOB: 05-23-1961, 50 y.o.   MRN: [id number]  HPI  Anxiety- not taking the prozac, concerned about side effects like previous meds, therefore did not take, mood has not changed much, continues to have cyring episodes, she has been released from her couselor to as needed basis. Clonazepam helps her panic attacks, has not used very much  Fatigue- feels more tired this week, would like her Hemoglobin checked, walking some  Abd pain- has intermittant sharp pains at site of surgical incision, feels she has swelling in that area as well, pain moves from surgical site to her hips, denies vaginal bleeding, no discharge, no change in bowel, no constipation, no dysuria, tolerating po , no fever  Foot pain- heel pain in left foot for a few weeks after attending an event and standing flats, still has pain in heel even after resting, even if she uses her tennis shoes has pain, no pain at ankle or forefoot   Review of Systems per above     Objective:   Physical Exam   GEN- NAD,well appearing    ABD- Soft, NT, ND, NABS, incision, beneath pannus in LLQ, well healed, no hernia , non tender, no swelling, no mass felt   EXT- HIP- neg hip rock, no pain with IR/ER, neg FABER, neg SLR   Foot- TTP over callus on left heel pad, non tender at insertion on plantar fascia, normal ROM of ankle, no swelling     Pulses DP 2+  PSYCH- flat affect, no overly anxious, good eye contact     Assessment & Plan:

## 2011-02-17 NOTE — Assessment & Plan Note (Signed)
Per previous notes she was reluctant to take the prozac only took for 1 week Using prn benzo, Difficult situation, she has many barriers to treatment with meds and no longer seeing therapist on a regular basis Continue to follow, no evidence of suicidal ideation

## 2011-02-24 NOTE — Group Therapy Note (Signed)
April [name], April [name]            ACCOUNT NO.:  [id number]   MEDICAL RECORD NO.:  [id number]          PATIENT TYPE:  WOC   LOCATION:  [facility]                   FACILITY:  [facility]   PHYSICIAN:  Jaynie Collins, MD     DATE OF BIRTH:  [birth year]   DATE OF SERVICE:  04/18/2009                                  CLINIC NOTE   CHIEF COMPLAINT:  Menorrhagia.   HISTORY OF PRESENT ILLNESS:  The patient is a 50 year old gravida 4,  para 4-0-0-4 with menorrhagia, who is status post a NovaSure endometrial  ablation on March 2010, who is here today reporting continuing  menorrhagia.  The patient does report that she continue to have very  heavy bleeding after the NovaSure endometrial ablation and is requesting  further surgical workup.  Of note, her last endometrial biopsy was in  July 2009 and was just notable for weekly proliferative endometrium.  The patient reports that she does have regular periods, but her periods  last for 7 days and each day she uses more pads.  She was seen in the  [facility] during her last menstrual period and was noted to have a hemoglobin  of 9.6 and was given megestrol taper.  The patient is unsure of what  type of surgery she wants to do next, but she is frustrated by this  bleeding.  Of note, she has also used a Mirena IUD in the past before  she had the NovaSure.   PAST OB/GYN HISTORY:  The patient has had all vaginal deliveries.  She  has menorrhagia and has had it for several years.  She denies any  abnormal Pap smears.  Her last one was in November 2009.  Her last  mammogram was in 2008 and she denies any sexually transmitted  infections.   PAST MEDICAL HISTORY:  Depression, hypercholesterinemia, and obesity.   PAST SURGICAL HISTORY:  Endometrial ablation in March 2009.   MEDICATIONS:  The patient is on an antidepressant, but she is unsure of  which kind, and she takes it daily.   ALLERGIES:  No known drug allergies.   SOCIAL HISTORY:  The patient has a  remote history of polysubstance  abuse.  She does not smoke, drink alcohol, or use any illicit drugs  currently.   FAMILY HISTORY:  Remarkable for diabetes, heart attack, and high blood  pressure.   PHYSICAL EXAMINATION:  VITAL SIGNS:  Blood pressure is 108/64, pulse 81,  respirations 20, temperature is 97, weight 249.4 pounds, height 6 feet 2  inches. GENERAL:  No apparent distress.  ABDOMEN:  Soft, nontender, nondistended, obese.  EXTREMITIES:  No clubbing, cyanosis, or edema.   Endometrial biopsy, the patient was counseled regarding the need for  repeat endometrial biopsy given her continued menorrhagia.  Informed  consent was obtained.  Of note, the patient has not been sexually active  for several months, so pregnancy test was not necessary.  On pelvic  examination, the patient was noted to have normal external female  genitalia, pink, well rugated vagina.  Nulliparous cervical os which was  swabbed with Betadine and then a tenaculum was placed in the anterior  lip of the cervix.  A 3-mm Pipelle was advanced into the uterus to a  depth of 10 cm and suction was activated on the Pipelle and moderate  amount of endometrial tissue was obtained and sent to Pathology.  All  instruments were then removed from the patient's pelvis.  She tolerated  the procedure well.   ASSESSMENT AND PLAN:  The patient is a 50 year old, gravida 4, para 4,  here for a surgical consult for continued menorrhagia.  The patient will  have a pelvic ultrasound to reevaluate her uterus and will follow up on  the endometrial biopsy results.  The patient was offered a repeat  endometrial ablation versus a vaginal hysterectomy.  The patient is  unsure what she wants do at this point and we will think about her  options when she does return in 2 weeks, these options will be re-  discussed with her and the appropriate surgical planning will be done at  that time.           ______________________________   Jaynie Collins, MD     UA/MEDQ  D:  04/18/2009  T:  04/19/2009  Job:  [id number]

## 2011-02-24 NOTE — Group Therapy Note (Signed)
NAMEMARLYSS, April [name] NO.:  [id number]   MEDICAL RECORD NO.:  [id number]          PATIENT TYPE:  WOC   LOCATION[facility]                   FACILITY:  [facility]   PHYSICIAN:  Argentina Donovan, MD        DATE OF BIRTH:  08-Aug-1961   DATE OF SERVICE:  11/01/2008                                  CLINIC NOTE   CHIEF COMPLAINT:  Extremely heavy vaginal bleeding both during and  between periods.   HISTORY OF PRESENT ILLNESS:  The patient is a 50 year old African  American female gravida 4, para 4-0-0-4, who was referred in by her  internist because of extremely heavy periods and secondary anemia.  The  patient had an ultrasound which revealed normal size uterus with a tiny  14-mm round hypoechoic lesion probably submucous fibroid.  Endometrial  biopsy and Pap smear were done, which were completely normal.  The  patient returned after being given information and desired to be  scheduled for an endometrial ablation to try and control at the behest  of her internist.  The patient is in good health with the exception of  her weight, as she is 5 feet 1 inch tall and weighs 245 pounds.   PAST MEDICAL HISTORY:  Benign.  She has never had any serious problems  except a heavy bleeding.   She does take iron and ibuprofen for the cramps and she is on Zoloft for  depression.   FAMILY HISTORY:  She has a family history of diabetes, heart disease,  and high blood pressure in multiple family members.   ALLERGIES:  No known medical allergies and no allergy to latex.   REVIEW OF SYSTEMS:  Negative with the exception of the present illness.   PHYSICAL EXAMINATION:  VITAL SIGNS:  Her blood pressure was 112/65,  pulse is 73 per minute.  GENERAL:  A well-developed, obese black female in no acute distress.  HEENT:  Normocephalic, PERRLA within normal limits.  NECK:  Supple.  Thyroid is symmetrical with no masses.  BACK:  Erect.  BREASTS:  Large, pendulous, no dominant masses.  No nipple  discharge.  No supraclavicular nor axillary nodes palpable.  LUNGS:  Clear to auscultation and percussion.  HEART:  No murmur, normal sinus rhythm.  ABDOMEN:  Soft, flat nontender, no masses or organomegaly.  EXTREMITIES:  No edema.  No varicosities.  NEUROLOGIC:  DTRs within normal limits.  GENITALIA:  External genitalia is normal.  BUN is within normal limits.  Vagina is clean and well rugated.  Cervix clean, parous.  Uterus and  adnexa could not be well palpated because of the habitus of the patient.  Pelvic exam showed no masses.   IMPRESSION:  The patient in good physical health except for obesity who  has intractable menometrorrhagia.  She had tried the Mirena and had it  removed because it did not help with the bleeding.  She will be  scheduled for a NovaSure endometrial ablation.           ______________________________  Argentina Donovan, MD     PR/MEDQ  D:  11/01/2008  T:  11/02/2008  Job:  [id number] 

## 2011-02-24 NOTE — Group Therapy Note (Signed)
NAME:  April [name], April [name]            ACCOUNT NO.:  [id number]   MEDICAL RECORD NO.:  [id number]          PATIENT TYPE:  WOC   [facility]ATION[facility]                   [facility]ACILITY:  [facility]   PHYSICIAN:  Jaynie Collins, MD     DATE OF BIRTH:  Dec 06, 1960   DATE OF SERVICE:  05/16/2009                                  CLINIC NOTE   The patient is a 50 year old, gravida 4, para 2-0-2-2 with a long  history of menorrhagia, who underwent a NovaSure endometrial ablation in  2010, but reported that she continue to have very heavy bleeding.  At  her last visit on April 18, 2009, she underwent an endometrial biopsy that  was remarkable for benign proliferative endometrium.  No hyperplasia,  atypia, or malignancy identified.  She was also scheduled for a pelvic  ultrasound, which showed an 11-week sized uterus with intramural and  submucosal fibroids and normal adnexa.  The patient is here today to  follow up results and also to discuss further management.  She does  report that her last menstrual period which was on May 07, 2009 was  lighter than usual and she did not have to use any pads.   Physical exam is deferred at this visit.  Please refer to the physical  exam on April 18, 2009 visit.  There has been no intervalent change in her  medical or surgical history and medications history.   ASSESSMENT AND PLAN:  This patient is a 51 year old, gravida 4, para 2-0-  2-2 with continued menorrhagia after an endometrial ablation with a  pelvic ultrasound showed a 11-week size uterus and benign endometrial  biopsy given that the patient has tried a Conservation officer, historic buildings and Mirena  IUD.  At this point, the options were given to her for either oral  hormonal management of her menorrhagia versus surgical management in the  form of her repeat ablation using another modality or a vaginal or  abdominal hysterectomy.  The patient wants to avoid major surgery and  just wants to go with her repeat endometrial ablation.   She was told  that given that she has failed 1 ablation modality that it is possible  that not ablation modality might improve her symptoms or may not work  and that will mean that she would need definitive surgery, but the  patient wants to try this first.  The risks of both surgery were  discussed with the patient and the benefits were also discussed and the  patient has decided that she wants to proceed with a hydrothermal  ablation.  She was given [facility] patient pamphlet about endometrial  ablation and hysterectomy and was told that she will be booked for  hydrothermal ablation and to expect a call from the surgical scheduler  given her the time and date of surgery.  She was told to call the clinic  if she changes her mind about having surgery or if she decides of  another modality of surgery.  The patient verbalized understanding of  the plan.  In the meantime, she was given a prescription for diclofenac  DR 75 mg p.o. b.i.d. to use during her  period to help with the  inflammation that she has during her periods and also possibly reduced  blood loss.  We will see how she responds to this visit.           ______________________________  Jaynie Collins, MD     UA/MEDQ  D:  05/16/2009  T:  05/17/2009  Job:  [id number]

## 2011-02-24 NOTE — Group Therapy Note (Signed)
[name], April [name] NO.:  [id number]   MEDICAL RECORD NO.:  [id number]          PATIENT TYPE:  WOC   LOCATION:  [facility]                   FACILITY:  [facility]   PHYSICIAN:  Argentina Donovan, MD        DATE OF BIRTH:  04-Sep-1961   DATE OF SERVICE:  01/23/2009                                  CLINIC NOTE   The patient is a 50 year old African American female who underwent  NovaSure endometrial ablation by Dr. Silas Flood on December 18, 2008.  Previously  she had had very heavy bleeding and an ultrasound revealed a point 4 14  mm probable submucous fibroid.  The uterus was fairly normal size.  She  went into the MAU on the 22nd because she was having heavy vaginal  bleeding.  I told her there was a tear of the cervix and put her on  Megace.  The Megace stopped the bleeding and I put her on a decreasing  dose over 12 days and then about 4 days afterward she had what looked  like a period and it has pretty well stopped by now.  On examination the  cervix looks fine.  It is friable in two little pinpoint areas that I  think was where the tenaculum held but there is no active bleeding  there.  I think that she was having the normal, it seemed to me that she  was having the normal amount of bleeding post ablation but I have told  her to come back if it restarts again and we will just go through a  normal followup with this patient.   IMPRESSION:  Satisfactory exam post endometrial ablation NovaSure.           ______________________________  Argentina Donovan, MD     PR/MEDQ  D:  01/23/2009  T:  01/23/2009  Job:  [id number]

## 2011-02-24 NOTE — Group Therapy Note (Signed)
April [name], JEFFRIES NO.:  [id number]   MEDICAL RECORD NO.:  [id number]          PATIENT TYPE:  WOC   LOCATION:  [facility]                   FACILITY:  [facility]   PHYSICIAN:  Argentina Donovan, MD        DATE OF BIRTH:  11-11-1960   DATE OF SERVICE:                                  CLINIC NOTE   __________.   REASON FOR VISIT:  Consultation for menorrhagia and fibroids.   SUBJECTIVE:  [name] is a very pleasant 50 year old female who for  the past year has been noticing heavier periods with each cycle.  In  addition, she has been having increasing pain with her cycle, primarily  in her lower abdomen.  The severity of this pain is severe enough that  it will actually wake her from sleep.  For her pain she has been taking  ibuprofen, which will help her cramping somewhat, but does not relieve  it completely.  Her cycle has been regular throughout this and her flow  has not changed in length and is stable at five days.  She has, however,  had episodes of fatigue and weakness and even one time had an episode of  syncope, for which she was evaluated and treated at the hospital.  She  was found to have a low hemoglobin of approximately 8 and it was felt  this was secondary to her heavy menstrual cycles.  She has noted that  she has had some small clots with her cycles.   PAST MEDICAL HISTORY:  INCOMPLETE     ______________________________  unclear    ______________________________  Argentina Donovan, MD    /MEDQ  D:  05/10/2008  T:  05/10/2008  Job:  [id number]

## 2011-02-24 NOTE — Group Therapy Note (Signed)
April [name], SHROPSHIRE NO.:  [id number]   MEDICAL RECORD NO.:  [id number]          PATIENT TYPE:  WOC   LOCATION:  [facility]                   FACILITY:  [facility]   PHYSICIAN:  Dorthula Perfect, MD     DATE OF BIRTH:  13-Jun-1961   DATE OF SERVICE:  03/29/2009                                  CLINIC NOTE   A 50 year old black female returns today having had NovaSure procedure  done March 9 of this year.  Since that procedure, she was continued to  have monthly heavy periods.  Before the procedure, her periods would  last up to 2 weeks.  Now they last about 7 days, but are still described  as very heavy using many many pads.  She is also staining her underwear  and bed sheets.  She was seen the other day in MAU.  Hemoglobin was 9.6,  hematocrit 29.3.  She was placed on iron.  The problem with the bleeding  basically has not significantly improved.   PHYSICAL EXAMINATION:  VITAL SIGNS:  Weight 152, blood pressure 112/81.  ABDOMEN:  Obese, soft, nontender.  PELVIC:  External genitalia and BS glands were normal.  She is not  bleeding today.  Cervix is epithelialized and is normal.  Uterus is  nontender and feels to be upper limits of normal size.  Adnexal areas  are negative, but I am not able to palpate the ovaries due to her size.   DIAGNOSES:  1. Status post NovaSure ablation for menorrhagia.  2. Abnormal uterine bleeding with some anemia.   DISPOSITION:  1. Continue to take her iron that was started.  2. Return to see Dr. Silas Flood to evaluate for repeat NovaSure procedure.           ______________________________  Dorthula Perfect, MD     ER/MEDQ  D:  03/29/2009  T:  03/29/2009  Job:  [id number]

## 2011-02-24 NOTE — Op Note (Signed)
[name], April [name]            ACCOUNT NO.:  [id number]   MEDICAL RECORD NO.:  [id number]          PATIENT TYPE:  AMB   LOCATION:  [facility]                           [facility]   PHYSICIAN:  Norton Blizzard, MD    DATE OF BIRTH:  07-Apr-1961   DATE OF PROCEDURE:  DATE OF DISCHARGE:                               OPERATIVE REPORT   PREOPERATIVE DIAGNOSIS:  Intractable menometrorrhagia.   POSTOPERATIVE DIAGNOSIS:  Intractable menometrorrhagia.   PROCEDURE:  Diagnostic hysteroscopy, NovaSure endometrial ablation.   SURGEON:  Dr. Norton Blizzard, MD   ANESTHESIA:  General.   IV FLUIDS:  A liter of lactated Ringer's.   ESTIMATED BLOOD LOSS:  Minimal.   INDICATIONS:  The patient is a 50 year old gravida 4, para 4 with  intractable menometrorrhagia.  The patient had a negative endometrial  biopsy and an ultrasound which showed a normal size uterus with a 14-mm  submucosal fibroid.  She desired ablation as her route of management  prior to her surgery.  The risks of the procedure including bleeding,  infection, injury to surrounding organs, need for additional procedures  were explained to the patient and written informed consent was obtained.   FINDINGS:  The patient was noted to have a long cervix which measured  about 6.5 cm.  Her uterus sounded to 11 cm giving a cavity length of 4.5  cm.  Cavity width was also noted to be rather constricted at 2.5 cm.  The endometrial ablation was done at 55 watts of power and total time  was 75 seconds.   SPECIMENS:  None.   COMPLICATIONS:  None immediately.   PROCEDURE DETAILS:  The patient was taken to the operating room where  general anesthesia was placed and found to be adequate.  She was then  placed in the dorsal lithotomy position and prepped and draped in the  sterile manner.  Attention was then turned to the patient's pelvis where  her bladder was catheterized for an unmeasured amount of clear yellow  urine.  A vaginal speculum  was placed and her cervix was grasped with a  tenaculum.  A paracervical block using 0.25% Marcaine with epinephrine  was administered and a sound was used to measure the cervical and cavity  length as mentioned above.  The cervix was then dilated up to  accommodate diagnostic hysteroscope.  Upon evaluation of the fundus with  a diagnostic hysteroscope, it was noted that she had a significantly  long cervix and also a narrow uterine cavity.  The hysteroscope was  removed and the cervix was then dilated up to accommodate the NovaSure  apparatus.  It was gently advanced to the fundus, and upon seeding with  the apparatus, the cavity width was obtained to be 2.5 cm.  The cavity  integrity test was done and was found to be normal and the endometrial  ablation was carried out according to protocol.  The power was 55 watts  and total ablation time of 75 seconds.  After the ablation, the NovaSure  apparatus was removed.  Of note during the procedure, the tenaculum did  tear  through the anterior part of the cervix and this tear was repaired  using a 3-0 Vicryl running stitch.  Overall good hemostasis was noted.  All instruments were then removed from the patient's vagina.  The  patient tolerated the procedure well.  Sponge, instrument, needle counts  were correct x2.  She was taken to the recovery room in stable  condition.      Norton Blizzard, MD  Electronically Signed     UAD/MEDQ  D:  12/18/2008  T:  12/18/2008  Job:  [id number]

## 2011-03-04 ENCOUNTER — Ambulatory Visit: Payer: Medicaid Other | Admitting: Family Medicine

## 2011-04-14 ENCOUNTER — Encounter: Payer: Self-pay | Admitting: Family Medicine

## 2011-05-08 DIAGNOSIS — N920 Excessive and frequent menstruation with regular cycle: Secondary | ICD-10-CM

## 2011-05-08 DIAGNOSIS — D219 Benign neoplasm of connective and other soft tissue, unspecified: Secondary | ICD-10-CM

## 2011-05-08 DIAGNOSIS — N938 Other specified abnormal uterine and vaginal bleeding: Secondary | ICD-10-CM | POA: Insufficient documentation

## 2011-05-13 ENCOUNTER — Ambulatory Visit: Payer: Medicaid Other | Admitting: Obstetrics and Gynecology

## 2011-05-14 ENCOUNTER — Ambulatory Visit (INDEPENDENT_AMBULATORY_CARE_PROVIDER_SITE_OTHER): Payer: Medicaid Other | Admitting: Obstetrics and Gynecology

## 2011-05-14 DIAGNOSIS — R5381 Other malaise: Secondary | ICD-10-CM

## 2011-05-14 DIAGNOSIS — N949 Unspecified condition associated with female genital organs and menstrual cycle: Secondary | ICD-10-CM

## 2011-05-14 DIAGNOSIS — R102 Pelvic and perineal pain: Secondary | ICD-10-CM

## 2011-05-14 DIAGNOSIS — R5383 Other fatigue: Secondary | ICD-10-CM

## 2011-05-14 NOTE — Progress Notes (Signed)
This patient is a 50 year old morbidly obese African American female who underwent total abdominal hysterectomy right para tubal ovarian cyst removed one year ago this month she's been seeing family practice medicine since that time and doing well. But she has noticed some old nominal pain in the area of her incision not change by palpation but when she is on her fetal. I'm sorry she had a supracervical hysterectomy. On examination the abdomen is soft flat elicits no tenderness to deep palpation no organomegaly but she has a significant abdominal apron. Reaser not palpable because of the habitus of the patient. Going to order a pelvic ultrasound. In addition she complains of terrible fatigue when she gets home from work at the [employer]. She sits there therefore she is not on her feet most of the time . She has had a recent hemoglobin which was normal. We will check her TSH today.

## 2011-05-14 NOTE — Progress Notes (Signed)
Addended by: Toula Moos on: 05/14/2011 02:48 PM   Modules accepted: Orders

## 2011-05-15 LAB — TSH: TSH: 0.793 u[IU]/mL (ref 0.350–4.500)

## 2011-05-21 ENCOUNTER — Ambulatory Visit (HOSPITAL_COMMUNITY)
Admission: RE | Admit: 2011-05-21 | Discharge: 2011-05-21 | Disposition: A | Discharge status: Home / Self Care | Payer: Medicaid Other | Source: Ambulatory Visit | Attending: Obstetrics and Gynecology | Admitting: Obstetrics and Gynecology

## 2011-05-21 DIAGNOSIS — R102 Pelvic and perineal pain: Secondary | ICD-10-CM

## 2011-05-21 DIAGNOSIS — N949 Unspecified condition associated with female genital organs and menstrual cycle: Secondary | ICD-10-CM | POA: Insufficient documentation

## 2011-06-18 ENCOUNTER — Ambulatory Visit: Payer: Medicaid Other | Admitting: Obstetrics and Gynecology

## 2011-07-06 ENCOUNTER — Telehealth: Payer: Self-pay | Admitting: *Deleted

## 2011-07-06 NOTE — Telephone Encounter (Signed)
Pt left message stating that she had missed her last appt. She would like to know her test results of [place] and thyroid lab work.

## 2011-07-07 NOTE — Telephone Encounter (Signed)
Called pt- left message that her test results were normal. She may call back for follow up appt if needed

## 2011-07-08 LAB — COMPREHENSIVE METABOLIC PANEL
ALT: 9
Alkaline Phosphatase: 92
BUN: 13
CO2: 24
GFR calc Af Amer: >60
GFR calc non Af Amer: >60
Glucose, Bld: 84
Total Bilirubin: 0.3

## 2011-07-08 LAB — URINALYSIS, ROUTINE W REFLEX MICROSCOPIC
Glucose, UA: NEGATIVE
Hgb urine dipstick: NEGATIVE
Protein, ur: NEGATIVE
Specific Gravity, Urine: 1.021
pH: 7

## 2011-07-08 LAB — LIPASE, BLOOD: Lipase: 40

## 2011-07-08 LAB — CBC
HCT: 27.5 — ABNORMAL LOW
Hemoglobin: 8.7 — ABNORMAL LOW
RBC: 3.8 — ABNORMAL LOW
WBC: 9.5

## 2011-07-08 LAB — DIFFERENTIAL
Basophils Absolute: 0
Basophils Relative: 0
Eosinophils Absolute: 0.1
Monocytes Relative: 7
Neutro Abs: 7.5
Neutrophils Relative %: 79 — ABNORMAL HIGH

## 2011-07-08 LAB — AMYLASE: Amylase: 74

## 2011-07-10 LAB — POCT PREGNANCY, URINE: Preg Test, Ur: NEGATIVE

## 2011-10-08 ENCOUNTER — Ambulatory Visit (INDEPENDENT_AMBULATORY_CARE_PROVIDER_SITE_OTHER): Payer: Medicaid Other | Admitting: Family Medicine

## 2011-10-08 ENCOUNTER — Encounter: Payer: Self-pay | Admitting: Family Medicine

## 2011-10-08 VITALS — BP 108/77 | HR 65 | Temp 98.2°F | Wt 259.0 lb

## 2011-10-08 DIAGNOSIS — Z0000 Encounter for general adult medical examination without abnormal findings: Secondary | ICD-10-CM

## 2011-10-08 DIAGNOSIS — L259 Unspecified contact dermatitis, unspecified cause: Secondary | ICD-10-CM

## 2011-10-08 DIAGNOSIS — E669 Obesity, unspecified: Secondary | ICD-10-CM

## 2011-10-08 LAB — LIPID PANEL
Cholesterol: 198 mg/dL (ref 0–200)
LDL Cholesterol: 127 mg/dL — ABNORMAL HIGH (ref 0–99)
Total CHOL/HDL Ratio: 4.2 Ratio
VLDL: 24 mg/dL (ref 0–40)

## 2011-10-08 LAB — COMPREHENSIVE METABOLIC PANEL
ALT: 9 U/L (ref 0–35)
Alkaline Phosphatase: 116 U/L (ref 39–117)
CO2: 26 mEq/L (ref 19–32)
Creat: 0.58 mg/dL (ref 0.50–1.10)
Sodium: 135 mEq/L (ref 135–145)
Total Bilirubin: 0.4 mg/dL (ref 0.3–1.2)

## 2011-10-08 MED ORDER — TRIAMCINOLONE ACETONIDE 0.1 % EX CREA: TOPICAL_CREAM | Freq: Two times a day (BID) | CUTANEOUS | Status: DC

## 2011-10-08 NOTE — Patient Instructions (Signed)
It was nice seeing you today. I would like for you to try some inserts for your shoes to see if that helps your feet I will let you know the results of your labs when they return Please get your pap and mammogram done at [facility] hospital Call with any questions

## 2011-10-08 NOTE — Progress Notes (Signed)
  Subjective:     April [name] is a 50 y.o. female and is here for a comprehensive physical exam. The patient reports problems - Eczema and needs refill on triamcinolone.  Has burning in her feet when standing still on them for greater than >2 hours. Denies weakness in feet or legs, improved with rest.   History   Social History  . Marital Status: Single    Spouse Name: N/A    Number of Children: N/A  . Years of Education: N/A   Occupational History  . Not on file.   Social History Main Topics  . Smoking status: Former Games developer  . Smokeless tobacco: Never Used  . Alcohol Use: No  . Drug Use: No  . Sexually Active: Not on file   Other Topics Concern  . Not on file   Social History Narrative  . No narrative on file   Health Maintenance  Topic Date Due  . Pap Smear  07/15/1980  . Influenza Vaccine  07/13/2011  . Tetanus/tdap  05/13/2015    The following portions of the patient's history were reviewed and updated as appropriate: allergies, current medications, past family history, past medical history, past social history, past surgical history and problem list.  Review of Systems A comprehensive review of systems was negative.   Objective:    BP 108/77  Pulse 65  Temp 98.2 F (36.8 C)  Wt 259 lb (117.482 kg) General appearance: alert, cooperative and no distress Head: Normocephalic, without obvious abnormality, atraumatic Eyes: negative findings: conjunctivae and sclerae normal and pupils equal, round, reactive to light and accomodation Ears: normal TM's and external ear canals both ears Nose: Nares normal. Septum midline. Mucosa normal. No drainage or sinus tenderness. Throat: lips, mucosa, and tongue normal; teeth and gums normal Neck: no adenopathy, supple, symmetrical, trachea midline and thyroid not enlarged, symmetric, no tenderness/mass/nodules Lungs: clear to auscultation bilaterally Heart: regular rate and rhythm, S1, S2 normal, no murmur, click, rub or  gallop Abdomen: soft, non-tender; bowel sounds normal; no masses,  no organomegaly Pelvic: deferred Extremities: extremities normal, atraumatic, no cyanosis or edema Pulses: 2+ and symmetric Skin: eczema - neck, back Neurologic: Alert and oriented X 3, normal strength and tone. Normal symmetric reflexes. Normal coordination and gait    Assessment:    Healthy female exam.   Foot Pain- Likely from pressure of standing  Plan:    Check CMET and lipids Advised exercise and dietary changes for weight loss and this may help with her foot pain.  For now will try cushioned insoles and resting when possible.  Plans to get pap at [facility] office, and she plans on getting mammogram.

## 2011-12-24 ENCOUNTER — Telehealth: Payer: Self-pay | Admitting: Family Medicine

## 2011-12-24 NOTE — Telephone Encounter (Signed)
Has never heard anything about her lab results

## 2011-12-24 NOTE — Telephone Encounter (Signed)
Called pt and informed of lab results from 12/12. Lorenda Hatchet, Renato Battles

## 2011-12-24 NOTE — Telephone Encounter (Signed)
Called pt.

## 2012-01-13 ENCOUNTER — Ambulatory Visit (INDEPENDENT_AMBULATORY_CARE_PROVIDER_SITE_OTHER): Payer: Medicaid Other | Admitting: *Deleted

## 2012-01-13 DIAGNOSIS — Z111 Encounter for screening for respiratory tuberculosis: Secondary | ICD-10-CM

## 2012-01-15 ENCOUNTER — Ambulatory Visit (INDEPENDENT_AMBULATORY_CARE_PROVIDER_SITE_OTHER): Payer: Medicaid Other | Admitting: Family Medicine

## 2012-01-15 ENCOUNTER — Telehealth: Payer: Self-pay | Admitting: Family Medicine

## 2012-01-15 ENCOUNTER — Encounter: Payer: Self-pay | Admitting: Family Medicine

## 2012-01-15 VITALS — BP 115/76 | HR 71 | Ht 60.5 in | Wt 260.0 lb

## 2012-01-15 DIAGNOSIS — E669 Obesity, unspecified: Secondary | ICD-10-CM

## 2012-01-15 DIAGNOSIS — M545 Low back pain: Secondary | ICD-10-CM

## 2012-01-15 DIAGNOSIS — L259 Unspecified contact dermatitis, unspecified cause: Secondary | ICD-10-CM

## 2012-01-15 LAB — TB SKIN TEST: Induration: 0

## 2012-01-15 MED ORDER — MELOXICAM 15 MG PO TABS: 15.0000 mg | ORAL_TABLET | Freq: Every day | ORAL | Status: DC

## 2012-01-15 NOTE — Telephone Encounter (Signed)
Patient was in earlier and was seen and also had her TB test read.  She needs a copy of the TB form for when she applies for jobs.  Please call her when it is ready to be picked up.

## 2012-01-15 NOTE — Telephone Encounter (Signed)
Message left on voicemail that note is ready for pick up.

## 2012-01-15 NOTE — Patient Instructions (Signed)
Thank you for coming in today, it was good to see you For your eczema I want you to use your triamcinolone cream every day for the next 7 days.  You can also use benadryl at night if you are still having itching. I am sending over a medication called meloxicam to your pharmacy to help with your back Continue to work on weight loss, be sure you get three small regular meals per day with healthy snacks in between.  Incorporate a vegetable into every meal if you can.  Continue walking for exercise.

## 2012-01-27 DIAGNOSIS — M545 Low back pain: Secondary | ICD-10-CM | POA: Insufficient documentation

## 2012-01-27 NOTE — Assessment & Plan Note (Signed)
TAC refilled

## 2012-01-27 NOTE — Assessment & Plan Note (Signed)
Likely muscle spasm, does not want anything sedating at this time.  Will give rx for meloxicam.  No red flags on exam.

## 2012-01-27 NOTE — Progress Notes (Signed)
  Subjective:    Patient ID: April [name], female    DOB: 10/24/1960, 51 y.o.   MRN: [id number]  HPI 1. Back pain:  C/o of LBP.  This is a recurrent problem for her.  Feels like tightness in lower back R>L.  Has not tried anything for pain.  Denies any radiation down legs, dysuria, stool incontinence or urine retention.    2.  Eczema:  Eczema typically on neck and shoulders, has been using triamcinolone cream.  Needs refill.  Also using benadryl for itching.   Worse around spring.  Denies any redness, warmth, pain, fever.   3. Obesity:  Realizes this is contributing to her low back pain.  She is trying to walk more and eat more regular meals that are smaller in portion.     Review of Systems     Objective:   Physical Exam  Constitutional:       Obese female, nad   Abdominal: Soft. She exhibits no distension. There is no tenderness.       No CVA tenderness.  Musculoskeletal:       Tightness of paraspinal musculature around L3-L4 R>L.  SLR, FABER negative bilaterally.   Skin:       Eczematous changes along nape of neck and outer shoulders, no surrounding erythema or warmth.           Assessment & Plan:

## 2012-01-27 NOTE — Assessment & Plan Note (Signed)
Working on dietary and activity changes. Weight relatively stable over the past year.

## 2012-06-08 ENCOUNTER — Encounter: Payer: Self-pay | Admitting: Emergency Medicine

## 2012-06-08 ENCOUNTER — Ambulatory Visit (INDEPENDENT_AMBULATORY_CARE_PROVIDER_SITE_OTHER): Payer: Medicaid Other | Admitting: Emergency Medicine

## 2012-06-08 VITALS — BP 116/81 | HR 66 | Ht 60.5 in | Wt 257.0 lb

## 2012-06-08 DIAGNOSIS — L259 Unspecified contact dermatitis, unspecified cause: Secondary | ICD-10-CM

## 2012-06-08 DIAGNOSIS — M25552 Pain in left hip: Secondary | ICD-10-CM | POA: Insufficient documentation

## 2012-06-08 DIAGNOSIS — M25559 Pain in unspecified hip: Secondary | ICD-10-CM

## 2012-06-08 MED ORDER — TRIAMCINOLONE ACETONIDE 0.1 % EX CREA: TOPICAL_CREAM | Freq: Two times a day (BID) | CUTANEOUS | Status: DC

## 2012-06-08 MED ORDER — MELOXICAM 15 MG PO TABS: 15.0000 mg | ORAL_TABLET | Freq: Every day | ORAL | Status: DC

## 2012-06-08 NOTE — Patient Instructions (Signed)
It was nice to meet you!  I'm sorry your hip is bothering your.  I think you strained or pulled a muscle in your hip.  I would like you take Meloxicam once a day for the next week.  This is an anti-inflammatory. Please take it with food as it can upset the stomach.  After one week, you can take it as needed for pain.  If your hip is not doing better in 1-2 weeks, please return to clinic for further evaluation.

## 2012-06-08 NOTE — Assessment & Plan Note (Signed)
Likely muscular (iliopsoas) strain following fall last week.  History and exam not concerning for fracture or nerve injury.  Will give meloxicam 15mg  daily x1 week.  Follow up if not getting better in 1-2 weeks.

## 2012-06-08 NOTE — Progress Notes (Signed)
  Subjective:    Patient ID: April [name], female    DOB: July 24, 1961, 51 y.o.   MRN: [id number]  HPI Reed R Bonneau is here for a SDA for left hip pain.  Patient reports falling on her right side about 1 week ago and a few days after that she started having pain in her left hip.  Described as feeling "like a sprain."  Pain flairs with prolonged walking and active hip flexion.  When hip pain flares, the pain travels across her lower pelvis (not cramps).  Reports some mild weakness of left hip flexion.  No numbness, tingling, radiating pain, or swelling.   I have reviewed and updated the following as appropriate: allergies and current medications SHx: former smoker   Review of Systems See HPI    Objective:   Physical Exam BP 116/81  Pulse 66  Ht 5' 0.5" (1.537 m)  Wt 257 lb (116.574 kg)  BMI 49.37 kg/m2 Gen: alert, cooperative, mild distress with active flexion of left hip, morbidly obese Abd: +BS, soft, NTND Ext: no edema, brisk cap refill bilaterally MSK -left knee: no erythema or pain on palpation, full active ROM -left hip: no erythema or bruising noted, tender to palpation over medial glute, full active/passive ROM, pain with hip flexion, strength 5/5 in gastrocs and quads, 5-/5 in hip flexion due to pain, also has pain in left hip with right hip extension -right knee: no erythema or pain on palpation, full active ROM -right hip: no tenderness, full ROM, 5/5 strength in quads, gastrocs, hip flexion      Assessment & Plan:

## 2012-12-01 ENCOUNTER — Other Ambulatory Visit: Payer: Self-pay | Admitting: Emergency Medicine

## 2012-12-01 ENCOUNTER — Ambulatory Visit
Admission: RE | Admit: 2012-12-01 | Discharge: 2012-12-01 | Disposition: A | Discharge status: Home / Self Care | Payer: Medicaid Other | Source: Ambulatory Visit | Attending: Family Medicine | Admitting: Family Medicine

## 2012-12-01 ENCOUNTER — Ambulatory Visit (INDEPENDENT_AMBULATORY_CARE_PROVIDER_SITE_OTHER): Payer: Medicaid Other | Admitting: Emergency Medicine

## 2012-12-01 ENCOUNTER — Encounter: Payer: Self-pay | Admitting: Emergency Medicine

## 2012-12-01 VITALS — BP 114/72 | HR 72 | Ht 65.0 in | Wt 258.0 lb

## 2012-12-01 DIAGNOSIS — E669 Obesity, unspecified: Secondary | ICD-10-CM

## 2012-12-01 DIAGNOSIS — M549 Dorsalgia, unspecified: Secondary | ICD-10-CM

## 2012-12-01 DIAGNOSIS — M25569 Pain in unspecified knee: Secondary | ICD-10-CM

## 2012-12-01 DIAGNOSIS — Z96659 Presence of unspecified artificial knee joint: Secondary | ICD-10-CM

## 2012-12-01 DIAGNOSIS — M25561 Pain in right knee: Secondary | ICD-10-CM

## 2012-12-01 DIAGNOSIS — M545 Low back pain: Secondary | ICD-10-CM

## 2012-12-01 DIAGNOSIS — Z0000 Encounter for general adult medical examination without abnormal findings: Secondary | ICD-10-CM | POA: Insufficient documentation

## 2012-12-01 HISTORY — DX: Presence of unspecified artificial knee joint: Z96.659

## 2012-12-01 LAB — COMPREHENSIVE METABOLIC PANEL
Alkaline Phosphatase: 124 U/L — ABNORMAL HIGH (ref 39–117)
CO2: 26 mEq/L (ref 19–32)
Creat: 0.48 mg/dL — ABNORMAL LOW (ref 0.50–1.10)
Glucose, Bld: 95 mg/dL (ref 70–99)
Total Bilirubin: 0.3 mg/dL (ref 0.3–1.2)

## 2012-12-01 LAB — CBC
HCT: 37.1 % (ref 36.0–46.0)
MCV: 80.7 fL (ref 78.0–100.0)
RBC: 4.6 MIL/uL (ref 3.87–5.11)
WBC: 7.3 10*3/uL (ref 4.0–10.5)

## 2012-12-01 LAB — TSH: TSH: 1.121 u[IU]/mL (ref 0.350–4.500)

## 2012-12-01 MED ORDER — MELOXICAM 15 MG PO TABS: 15.0000 mg | ORAL_TABLET | Freq: Every day | ORAL | Status: DC
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
## 2012-12-01 NOTE — Assessment & Plan Note (Addendum)
Patient working on dietary changes.  Reviewed health risks of obesity.  Offered referral to nutritionist, which she declined at this time.  Will check screening labs today including CMP, LDL, A1c, TSH and CBC.

## 2012-12-01 NOTE — Assessment & Plan Note (Signed)
Likely arthritis related to obesity.  Discussed weight loss.  Will check lumbar films given history of substance abuse.  Discussed using meloxicam and tylenol prn for pain control.

## 2012-12-01 NOTE — Assessment & Plan Note (Signed)
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 Will check standing films for baseline.  Discussed meloxicam and tylenol prn for pain control.  No role for steroid injection given intermittent nature of pain.

## 2012-12-01 NOTE — Assessment & Plan Note (Signed)
Referral for colonoscopy placed.  Information on mammogram given.  Return in May for pap given supra-cervical hysterectomy.

## 2012-12-01 NOTE — Patient Instructions (Addendum)
It was nice to see you! Keep working on the diet changes. For your back and knee pain -take Meloxicam daily as needed -you can also take extra strength tylenol 2 tablets up to 3 times a day. We are going to check some x-rays and lab work today.  I will call if anything is wrong, otherwise you will get a letter in the mail in 1-2 weeks. Please schedule a mammogram.   I have put in a referral to GI to get a screening colonoscopy to look for early colon cancer.  You should be getting a call about this in a few weeks. Follow up in May for a pap smear.

## 2012-12-02 ENCOUNTER — Encounter: Payer: Self-pay | Admitting: Emergency Medicine

## 2012-12-15 ENCOUNTER — Other Ambulatory Visit: Payer: Self-pay

## 2012-12-15 DIAGNOSIS — Z1231 Encounter for screening mammogram for malignant neoplasm of breast: Secondary | ICD-10-CM

## 2013-01-20 ENCOUNTER — Ambulatory Visit
Admission: RE | Admit: 2013-01-20 | Discharge: 2013-01-20 | Disposition: A | Discharge status: Home / Self Care | Payer: Medicaid Other | Source: Ambulatory Visit

## 2013-01-20 DIAGNOSIS — Z1231 Encounter for screening mammogram for malignant neoplasm of breast: Secondary | ICD-10-CM

## 2013-01-26 ENCOUNTER — Ambulatory Visit: Payer: Medicaid Other | Admitting: Emergency Medicine

## 2013-02-07 ENCOUNTER — Encounter: Payer: Self-pay | Admitting: Gastroenterology

## 2013-02-10 ENCOUNTER — Ambulatory Visit (INDEPENDENT_AMBULATORY_CARE_PROVIDER_SITE_OTHER): Payer: Medicaid Other | Admitting: Emergency Medicine

## 2013-02-10 ENCOUNTER — Encounter: Payer: Self-pay | Admitting: Emergency Medicine

## 2013-02-10 DIAGNOSIS — L299 Pruritus, unspecified: Secondary | ICD-10-CM | POA: Insufficient documentation

## 2013-02-10 DIAGNOSIS — Z0000 Encounter for general adult medical examination without abnormal findings: Secondary | ICD-10-CM

## 2013-02-10 NOTE — Progress Notes (Signed)
  Subjective:    Patient ID: April [name], female    DOB: 06/05/1961, 52 y.o.   MRN: [id number]  HPI April [name] is here for weight loss.  She states that she has been trying to loose weight without success.  She reports cutting out fried food and sweets.  Trying to eat more vegetables.  Does not drink soda or sweet tea.  She does state that she does not get much exercise.  She has seen a nutritionist in the past and does not feel that re-referral would be helpful.  Also states that her ears itch, particularly after meals.  Will dig around with a tissue covered paperclip.  Has been going on for years.  She also requested testing of her thyroid and a1c today.  Reviewed her results from 11/2012 which were normal.   I have reviewed and updated the following as appropriate: allergies and current medications SHx: former smoker   Review of Systems See HPI    Objective:   Physical Exam BP 112/78  Pulse 68  Ht 5\' 1"  (1.549 m)  Wt 264 lb (119.75 kg)  BMI 49.91 kg/m2 Gen: alert, cooperative, NAD, obese HEENT: AT/[place], sclera white, MMM, TMs normal bilaterally, ear canals with minimal cerumen Neck: supple CV: RRR, no murmurs Pulm: CTAB, no wheezes or rales      Assessment & Plan:

## 2013-02-10 NOTE — Assessment & Plan Note (Signed)
Exam is normal.  Will treat with olive oil, one drop in each ear nightly to see if that helps.

## 2013-02-10 NOTE — Assessment & Plan Note (Signed)
Will follow up in 1-2 months for pap smear.  Mammogram negative.  Patient to schedule colonoscopy.

## 2013-02-10 NOTE — Assessment & Plan Note (Signed)
Patient interested in weight loss.  Has been unsuccessful despite dietary changes.  Discussed that diet and exercise have to be done in combination.  Also discussed bariatric surgery.  Patient provided with information to attend free seminar for more information.  Declined referral to dietician.

## 2013-02-10 NOTE — Patient Instructions (Addendum)
It was nice to see you! Your thyroid and A1c are normal. Please go to the bariatric surgery seminar and let me know what you think. Put 1 drop on olive oil in each ear at bedtime to see if that helps with the itching. Follow up in 1-2 months for your pap smear.

## 2013-02-20 ENCOUNTER — Ambulatory Visit (AMBULATORY_SURGERY_CENTER): Payer: Medicaid Other | Admitting: *Deleted

## 2013-02-20 VITALS — Ht 61.0 in | Wt 260.8 lb

## 2013-02-20 DIAGNOSIS — Z1211 Encounter for screening for malignant neoplasm of colon: Secondary | ICD-10-CM

## 2013-02-20 MED ORDER — MOVIPREP 100 G PO SOLR: ORAL | Status: DC

## 2013-03-13 ENCOUNTER — Ambulatory Visit (AMBULATORY_SURGERY_CENTER): Payer: Medicaid Other | Admitting: Gastroenterology

## 2013-03-13 ENCOUNTER — Encounter: Payer: Self-pay | Admitting: Gastroenterology

## 2013-03-13 VITALS — BP 114/71 | HR 61 | Temp 97.9°F | Resp 22 | Ht 61.0 in | Wt 260.0 lb

## 2013-03-13 DIAGNOSIS — Z1211 Encounter for screening for malignant neoplasm of colon: Secondary | ICD-10-CM

## 2013-03-13 DIAGNOSIS — K5730 Diverticulosis of large intestine without perforation or abscess without bleeding: Secondary | ICD-10-CM

## 2013-03-13 MED ORDER — SODIUM CHLORIDE 0.9 % IV SOLN: 500.0000 mL | INTRAVENOUS | Status: DC

## 2013-03-13 NOTE — Patient Instructions (Addendum)
Discharge instructions given with verbal understanding. Handouts on diverticulosis and a high fiber diet given. Resume previous medications.YOU HAD AN ENDOSCOPIC PROCEDURE TODAY AT THE  ENDOSCOPY CENTER: Refer to the procedure report that was given to you for any specific questions about what was found during the examination.  If the procedure report does not answer your questions, please call your gastroenterologist to clarify.  If you requested that your care partner not be given the details of your procedure findings, then the procedure report has been included in a sealed envelope for you to review at your convenience later.  YOU SHOULD EXPECT: Some feelings of bloating in the abdomen. Passage of more gas than usual.  Walking can help get rid of the air that was put into your GI tract during the procedure and reduce the bloating. If you had a lower endoscopy (such as a colonoscopy or flexible sigmoidoscopy) you may notice spotting of blood in your stool or on the toilet paper. If you underwent a bowel prep for your procedure, then you may not have a normal bowel movement for a few days.  DIET: Your first meal following the procedure should be a light meal and then it is ok to progress to your normal diet.  A half-sandwich or bowl of soup is an example of a good first meal.  Heavy or fried foods are harder to digest and may make you feel nauseous or bloated.  Likewise meals heavy in dairy and vegetables can cause extra gas to form and this can also increase the bloating.  Drink plenty of fluids but you should avoid alcoholic beverages for 24 hours.  ACTIVITY: Your care partner should take you home directly after the procedure.  You should plan to take it easy, moving slowly for the rest of the day.  You can resume normal activity the day after the procedure however you should NOT DRIVE or use heavy machinery for 24 hours (because of the sedation medicines used during the test).    SYMPTOMS TO  REPORT IMMEDIATELY: A gastroenterologist can be reached at any hour.  During normal business hours, 8:30 AM to 5:00 PM Monday through Friday, call [phone].  After hours and on weekends, please call the GI answering service at [phone] who will take a message and have the physician on call contact you.   Following lower endoscopy (colonoscopy or flexible sigmoidoscopy):  Excessive amounts of blood in the stool  Significant tenderness or worsening of abdominal pains  Swelling of the abdomen that is new, acute  Fever of 100F or higher  FOLLOW UP: If any biopsies were taken you will be contacted by phone or by letter within the next 1-3 weeks.  Call your gastroenterologist if you have not heard about the biopsies in 3 weeks.  Our staff will call the home number listed on your records the next business day following your procedure to check on you and address any questions or concerns that you may have at that time regarding the information given to you following your procedure. This is a courtesy call and so if there is no answer at the home number and we have not heard from you through the emergency physician on call, we will assume that you have returned to your regular daily activities without incident.  SIGNATURES/CONFIDENTIALITY: You and/or your care partner have signed paperwork which will be entered into your electronic medical record.  These signatures attest to the fact that that the information above on your   After Visit Summary has been reviewed and is understood.  Full responsibility of the confidentiality of this discharge information lies with you and/or your care-partner.   

## 2013-03-13 NOTE — Op Note (Signed)
[facility] [address] [place], [address]   COLONOSCOPY PROCEDURE REPORT  PATIENT: April, [name]  MR#: [id number] BIRTHDATE: 1961/08/31 , 51  yrs. old GENDER: Female ENDOSCOPIST: Mardella Layman, MD, [facility] REFERRED BY: PROCEDURE DATE:  03/13/2013 PROCEDURE:   Colonoscopy, screening ASA CLASS:   Class III INDICATIONS:Average risk patient for colon cancer. MEDICATIONS: propofol (Diprivan) 300mg  IV  DESCRIPTION OF PROCEDURE:   After the risks and benefits and of the procedure were explained, informed consent was obtained.  A digital rectal exam revealed no abnormalities of the rectum.    The LB [id number]  endoscope was introduced through the anus and advanced to the cecum, which was identified by both the appendix and ileocecal valve .  The quality of the prep was excellent, using MoviPrep .  The instrument was then slowly withdrawn as the colon was fully examined.     COLON FINDINGS: Mild diverticulosis was noted in the sigmoid colon. A normal appearing cecum, ileocecal valve, and appendiceal orifice were identified.  The ascending, hepatic flexure, transverse, splenic flexure, descending, sigmoid colon and rectum appeared unremarkable.  No polyps or cancers were seen.     Retroflexed views revealed no abnormalities.     The scope was then withdrawn from the patient and the procedure completed.  COMPLICATIONS: There were no complications. ENDOSCOPIC IMPRESSION: 1.   Mild diverticulosis was noted in the sigmoid colon 2.   Normal colon....no polyps noted.  RECOMMENDATIONS: 1.  Continue current medications 2.  Continue current colorectal screening recommendations for "routine risk" patients with a repeat colonoscopy in 10 years.   REPEAT EXAM:  NF:AOZHY, Erin MD  _______________________________ eSigned:  Mardella Layman, MD, [facility] 03/13/2013 11:56 AM     PATIENT NAME:  April, [name] MR#: [id number]

## 2013-03-14 ENCOUNTER — Telehealth: Payer: Self-pay

## 2013-03-14 NOTE — Telephone Encounter (Signed)
Answering machine stated no memory available. But left a message after tone.

## 2013-04-04 ENCOUNTER — Telehealth: Payer: Self-pay | Admitting: Emergency Medicine

## 2013-04-04 NOTE — Telephone Encounter (Signed)
Pt told need OV.  Aevah Stansbery L, CMA  

## 2013-04-04 NOTE — Telephone Encounter (Signed)
Patient is request a different type of medication to replace Triamcinolone. She states that this medicine is not working. JW

## 2013-04-05 NOTE — Telephone Encounter (Signed)
Agree with office visit to reassess issue and discuss medication change.

## 2013-04-19 ENCOUNTER — Ambulatory Visit (INDEPENDENT_AMBULATORY_CARE_PROVIDER_SITE_OTHER): Payer: Medicaid Other | Admitting: Emergency Medicine

## 2013-04-19 ENCOUNTER — Encounter: Payer: Self-pay | Admitting: Emergency Medicine

## 2013-04-19 VITALS — BP 112/80 | HR 76 | Temp 98.0°F | Wt 254.0 lb

## 2013-04-19 DIAGNOSIS — M129 Arthropathy, unspecified: Secondary | ICD-10-CM

## 2013-04-19 DIAGNOSIS — M1990 Unspecified osteoarthritis, unspecified site: Secondary | ICD-10-CM

## 2013-04-19 MED ORDER — TRAMADOL HCL 50 MG PO TABS: 50.0000 mg | ORAL_TABLET | Freq: Three times a day (TID) | ORAL | Status: DC | PRN

## 2013-04-19 MED ORDER — MELOXICAM 15 MG PO TABS: 15.0000 mg | ORAL_TABLET | Freq: Every day | ORAL | Status: DC

## 2013-04-19 NOTE — Assessment & Plan Note (Signed)
Will continue meloxicam. Start scheduled tylenol. Tramadol prn for pain. Continue weight loss, maintain as much activity as possible. Follow up in 2-4 weeks.  Consider right knee injection at that time.

## 2013-04-19 NOTE — Patient Instructions (Addendum)
It was nice to see you! You have arthritis in your back and knees. - Keep taking meloxicam 1 tablet daily - start taking extra strength tylenol 2 tablets 3 times a day. - use tramadol every 8 hours as needed for pain. - stay as active as you can.  Weight loss will help a lot with the pain. Follow up in 2-4 weeks.

## 2013-04-19 NOTE — Progress Notes (Signed)
  Subjective:    Patient ID: April [name], female    DOB: 1961/08/03, 52 y.o.   MRN: [id number]  HPI April [name] is here for arthritis.  She reports pain in her low back and both of her knees (worse on the right).  Taking meloxicam and some ibuprofen. 1. Back: States this has been present for a long time.  It is worse with prolonged standing and with forward bending.  Located in the low back just to the right of the spine and over the SI joint.  Does get some tingling in bilateral feet if she stands for too long.  No bowel or bladder incontinence.  No shooting pains down the legs.  No lower extremity weakness. 2. Knees: Worse on the right.  Worse with squatting and stairs.  No locking or catching.  She has had lumbar and knee films in 11/2012 which show arthritis.  She is in the process of applying for disability.   I have reviewed and updated the following as appropriate: allergies and current medications SHx: former smoker  Review of Systems See HPI    Objective:   Physical Exam BP 112/80  Pulse 76  Temp(Src) 98 F (36.7 C) (Oral)  Wt 254 lb (115.214 kg)  BMI 48.02 kg/m2 Gen: alert, cooperative, NAD Back: no erythema, swelling or obvious deformity; no vertebral tenderness; tender over right SI joint and just to the right of the lumbar spine; negative SLR bilaterally; negative FABER bilaterally Right knee: mild effusion; no warmth or erythema, + crepitus, no tenderness to palpation Left knee: no effusion, warmth or erythema, + crepitus, no tenderness to palpation     Assessment & Plan:

## 2013-05-17 ENCOUNTER — Ambulatory Visit (INDEPENDENT_AMBULATORY_CARE_PROVIDER_SITE_OTHER): Payer: Medicaid Other | Admitting: Family Medicine

## 2013-05-17 ENCOUNTER — Encounter: Payer: Self-pay | Admitting: Family Medicine

## 2013-05-17 VITALS — BP 121/76 | HR 68 | Temp 98.0°F | Ht 61.0 in | Wt 252.0 lb

## 2013-05-17 DIAGNOSIS — L259 Unspecified contact dermatitis, unspecified cause: Secondary | ICD-10-CM

## 2013-05-17 DIAGNOSIS — M1990 Unspecified osteoarthritis, unspecified site: Secondary | ICD-10-CM

## 2013-05-17 DIAGNOSIS — M129 Arthropathy, unspecified: Secondary | ICD-10-CM

## 2013-05-17 MED ORDER — TRIAMCINOLONE ACETONIDE 0.1 % EX OINT: TOPICAL_OINTMENT | Freq: Two times a day (BID) | CUTANEOUS | Status: DC

## 2013-05-17 MED ORDER — DICLOFENAC SODIUM 75 MG PO TBEC: 75.0000 mg | DELAYED_RELEASE_TABLET | Freq: Two times a day (BID) | ORAL | Status: DC | PRN

## 2013-05-17 NOTE — Progress Notes (Signed)
  Subjective:    Patient ID: April [name], female    DOB: 1961-05-20, 52 y.o.   MRN: [id number]  HPI Patient is a 52 yo female who presents with continued knee pain.  Patient seen previously for arthritis in her knees and back. Given mobic and tramadol for pain. States tramadol makes her loopy. Continues to have pain in bilateral knees. Hurts mostly when she walks. 2/10 when sitting. 9/10 when standing. [facility] Does not remember injuring her knees.  Also states needs a refill on kenalog for eczema. Has worsened on her forehead and neck and is itching.   Review of Systems see HPI     Objective:   Physical Exam  Constitutional: She appears well-developed and well-nourished.  HENT:  Head: Normocephalic and atraumatic.  Musculoskeletal:  Normal ROM, pain on stepping up on to exam table, mild TTP left medial joint space, no effusion  BP 121/76  Pulse 68  Temp(Src) 98 F (36.7 C) (Oral)  Ht 5\' 1"  (1.549 m)  Wt 252 lb (114.306 kg)  BMI 47.64 kg/m2    Assessment & Plan:

## 2013-05-17 NOTE — Assessment & Plan Note (Signed)
Refilled kenalog. Advised that steroids can lead to hypopigmentation when applied to face.

## 2013-05-17 NOTE — Patient Instructions (Signed)
Nice to meet you. We will start a new medication called diclofenac for your arthritis. You can take this as needed up to 2 times per day. Please stop the meloxicam and tramadol. [facility] If this does not help please let us know.

## 2013-05-17 NOTE — Assessment & Plan Note (Signed)
Will d/c tramadol because made patient loopy. Stop mobic as well. Will give a trial of diclofenac.

## 2013-05-29 ENCOUNTER — Telehealth: Payer: Self-pay | Admitting: Emergency Medicine

## 2013-05-29 ENCOUNTER — Ambulatory Visit: Payer: Medicaid Other | Admitting: Family Medicine

## 2013-05-30 ENCOUNTER — Encounter: Payer: Self-pay | Admitting: Family Medicine

## 2013-05-30 ENCOUNTER — Ambulatory Visit (INDEPENDENT_AMBULATORY_CARE_PROVIDER_SITE_OTHER): Payer: Medicaid Other | Admitting: Family Medicine

## 2013-05-30 VITALS — BP 116/79 | HR 67 | Temp 98.0°F | Wt 255.0 lb

## 2013-05-30 DIAGNOSIS — M25569 Pain in unspecified knee: Secondary | ICD-10-CM

## 2013-05-30 DIAGNOSIS — M25561 Pain in right knee: Secondary | ICD-10-CM

## 2013-05-30 MED ORDER — NAPROXEN 500 MG PO TABS: 500.0000 mg | ORAL_TABLET | Freq: Two times a day (BID) | ORAL | Status: DC

## 2013-05-30 MED ORDER — ACETAMINOPHEN ER 650 MG PO TBCR: 650.0000 mg | EXTENDED_RELEASE_TABLET | Freq: Three times a day (TID) | ORAL | Status: DC | PRN

## 2013-05-30 NOTE — Assessment & Plan Note (Signed)
I suspect pes anserine bursitis in addition to knee osteoarthritis.  Please do the following: Treatment and rehab exercises from handout. Start tylenol 650 mg three times daily with naproxen 500 mg twice daily with food.  Continue to exercise, focus on strengthening. Be careful with Zumba do not over extend or flex your knee. Avoid abrupt movements.

## 2013-05-30 NOTE — Progress Notes (Signed)
Subjective:     Patient ID: April [name], female   DOB: 08-24-1961, 52 y.o.   MRN: [id number]  HPI 52 yo obese female with history of chronic knee pain R>L presents for f/u visit to discuss the following:  1. Knee pain: R>L. Tried to take tramadol, did not tolerate. Had feeling of intoxication. Has history of IDU, recovering addict and did not like the feeling. Tried mobic, diclofenac, tylenol w/o relief. Is working out with [employer], walking, aerobics the gym. Did not use recumbent bike or weights. Pain is anterior knee and below the knee along upper shin. No swelling.   2. Form: has form regarding disability. Informed patient to drop form up front to be placed in her PCP's box.   Review of Systems As per HPI     Objective:   Physical Exam BP 116/79  Pulse 67  Temp(Src) 98 F (36.7 C) (Oral)  Wt 255 lb (115.667 kg)  BMI 48.21 kg/m2 General appearance: alert, cooperative, no distress and morbidly obese Knee: Valgus alignment.  Normal to inspection with no erythema or effusion or obvious bony abnormalities. Palpation normal with no warmth or joint line tenderness or patellar tenderness or condyle tenderness. ROM normal in flexion and extension and lower leg rotation. Ligaments with solid consistent endpoints including ACL, PCL, LCL, MCL. Non painful patellar compression. Patellar and quadriceps tendons unremarkable. Hamstring and quadriceps strength is normal.    Assessment and Plan:

## 2013-05-30 NOTE — Patient Instructions (Addendum)
Ms. Outten,   Thank you for coming in today. For you knee pain: I suspect pes anserine bursitis in addition to knee osteoarthritis.  Please do the following: Treatment and rehab exercises from handout. Start tylenol 650 mg three times daily with naproxen 500 mg twice daily with food.  Continue to exercise, focus on strengthening. Be careful with Zumba do not over extend or flex your knee. Avoid abrupt movements.   Drop off form for Dr. Elwyn Reach up front.  Dr. Armen Pickup

## 2013-05-30 NOTE — Telephone Encounter (Signed)
error 

## 2013-06-16 ENCOUNTER — Encounter: Payer: Self-pay | Admitting: Family Medicine

## 2013-06-16 ENCOUNTER — Ambulatory Visit (INDEPENDENT_AMBULATORY_CARE_PROVIDER_SITE_OTHER): Payer: Medicaid Other | Admitting: Family Medicine

## 2013-06-16 VITALS — BP 108/72 | HR 65 | Ht 61.0 in | Wt 250.0 lb

## 2013-06-16 DIAGNOSIS — G8929 Other chronic pain: Secondary | ICD-10-CM

## 2013-06-16 DIAGNOSIS — M25561 Pain in right knee: Secondary | ICD-10-CM

## 2013-06-16 DIAGNOSIS — M25569 Pain in unspecified knee: Secondary | ICD-10-CM

## 2013-06-16 MED ORDER — METHYLPREDNISOLONE ACETATE 40 MG/ML IJ SUSP
40.0000 mg | Freq: Once | INTRAMUSCULAR | Status: AC
  Administered 2013-06-16: 40 mg via INTRA_ARTICULAR

## 2013-06-16 MED ORDER — TRAMADOL HCL 50 MG PO TABS: 25.0000 mg | ORAL_TABLET | Freq: Four times a day (QID) | ORAL | Status: DC | PRN

## 2013-06-16 NOTE — Patient Instructions (Addendum)
April [name],  Congratulations on your new job. Please rest today and ice your R knee when you get home.   If you develop after injection redness, swelling or worsening pain in your knee seek immediate medical attention. If you get at least 6 weeks of pain improvement in your knee, another injection can be done in 6 weeks or more.  Dr. Armen Pickup

## 2013-06-16 NOTE — Progress Notes (Signed)
Subjective:     Patient ID: April [name], female   DOB: Mar 23, 1961, 52 y.o.   MRN: [id number]  HPI 52 yo F with history of b/l knee arthritis presents with worsening pain R>L. 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 She stands for 5 hrs per day. Pain is worse with prolonged standing. Pain is anterior knee and inferior knee. No new injury. She tried prescribed naproxen x 3 doses but d/cd this due to palpitations. She is still taking tylenol. She is working to lose weight and is down 5 lbs. She is interested in trying tramadol again but concerned about sedation interfering with work and her school. She is interested in corticosteroid injection.   Review of Systems As per HPI     Objective:   Physical Exam BP 108/72  Pulse 65  Ht 5\' 1"  (1.549 m)  Wt 250 lb (113.399 kg)  BMI 47.26 kg/m2 Wt Readings from Last 3 Encounters:  06/16/13 250 lb (113.399 kg)  05/30/13 255 lb (115.667 kg)  05/17/13 252 lb (114.306 kg)   General appearance: alert, cooperative and no distress, morbidly obese  Extremities: R knee: Full ROM. no effusion. joint line tenderness. also tenderness over tibial tuberosity  DG R knee reviewed: tricompartmental arthritis.   After obtaining informed consent and under sterile technique  a steroid injection was performed at R knee using lateral approach with knee flexed using 2 ml of 1% plain Lidocaine and 40 mg of Depo Medrol 40 mg/ ml. This was well tolerated.    Assessment and Plan:

## 2013-06-16 NOTE — Assessment & Plan Note (Signed)
A: R knee arthritis. Steroid injection done today.  P: Please rest today and ice your R knee when you get home.   If you develop after injection redness, swelling or worsening pain in your knee seek immediate medical attention. If you get at least 6 weeks of pain improvement in your knee, another injection can be done in 6 weeks or more.

## 2013-06-17 ENCOUNTER — Encounter (HOSPITAL_COMMUNITY): Payer: Self-pay

## 2013-06-17 ENCOUNTER — Emergency Department (HOSPITAL_COMMUNITY)
Admission: EM | Admit: 2013-06-17 | Discharge: 2013-06-17 | Disposition: A | Discharge status: Home / Self Care | Payer: Medicaid Other | Attending: Emergency Medicine | Admitting: Emergency Medicine

## 2013-06-17 DIAGNOSIS — F329 Major depressive disorder, single episode, unspecified: Secondary | ICD-10-CM | POA: Insufficient documentation

## 2013-06-17 DIAGNOSIS — G8929 Other chronic pain: Secondary | ICD-10-CM | POA: Insufficient documentation

## 2013-06-17 DIAGNOSIS — Z8639 Personal history of other endocrine, nutritional and metabolic disease: Secondary | ICD-10-CM | POA: Insufficient documentation

## 2013-06-17 DIAGNOSIS — F3289 Other specified depressive episodes: Secondary | ICD-10-CM | POA: Insufficient documentation

## 2013-06-17 DIAGNOSIS — Z87891 Personal history of nicotine dependence: Secondary | ICD-10-CM | POA: Insufficient documentation

## 2013-06-17 DIAGNOSIS — M1711 Unilateral primary osteoarthritis, right knee: Secondary | ICD-10-CM

## 2013-06-17 DIAGNOSIS — M1710 Unilateral primary osteoarthritis, unspecified knee: Secondary | ICD-10-CM | POA: Insufficient documentation

## 2013-06-17 DIAGNOSIS — IMO0002 Reserved for concepts with insufficient information to code with codable children: Secondary | ICD-10-CM | POA: Insufficient documentation

## 2013-06-17 DIAGNOSIS — Z872 Personal history of diseases of the skin and subcutaneous tissue: Secondary | ICD-10-CM | POA: Insufficient documentation

## 2013-06-17 DIAGNOSIS — E669 Obesity, unspecified: Secondary | ICD-10-CM | POA: Insufficient documentation

## 2013-06-17 DIAGNOSIS — Z862 Personal history of diseases of the blood and blood-forming organs and certain disorders involving the immune mechanism: Secondary | ICD-10-CM | POA: Insufficient documentation

## 2013-06-17 DIAGNOSIS — F411 Generalized anxiety disorder: Secondary | ICD-10-CM | POA: Insufficient documentation

## 2013-06-17 MED ORDER — OXYCODONE-ACETAMINOPHEN 5-325 MG PO TABS: 1.0000 | ORAL_TABLET | ORAL | Status: DC | PRN
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
## 2013-06-17 NOTE — ED Notes (Signed)
Pt was seen at [facility] yesterday for knee pain and was given steroid shot and prescribed tramadol. She presents here to this ED with complaints of right knee swelling that started this morning. Hx of arthritis of knee. Pedal pulse palpable.

## 2013-06-17 NOTE — ED Notes (Signed)
Pt reports she just started a new job that requires a lot of walking and standing, "its been about nine years since ive worked like that."

## 2013-06-19 ENCOUNTER — Telehealth: Payer: Self-pay | Admitting: Emergency Medicine

## 2013-06-19 NOTE — Telephone Encounter (Signed)
Returned call to patient, she was seen in the ER over the weekend, the day after she was seen here for a knee injection, for pain and swelling in that knee. She was instructed by them to follow up here. Her PCP is not available for several weeks so I put her on with Dr Armen Pickup who saw her last.Busick, Rodena Medin

## 2013-06-19 NOTE — Telephone Encounter (Signed)
Pt wants to talk to dr Piedad Climes about the arthitis in her leg.  Went to ER on Saturday because she could not move her leg. Please call

## 2013-06-20 ENCOUNTER — Telehealth: Payer: Self-pay | Admitting: Emergency Medicine

## 2013-06-20 NOTE — Telephone Encounter (Signed)
Called pt. She had questions about using knee brace. I advised her to buy one over the counter (her insurance does not cover it). Knee brace would probably help with pain/swelling and giving her support. Also, I advised her to f/up with her PCP to be re-evaluated. She agreed and reports, that she has an appt on Monday. Lorenda Hatchet, Renato Battles

## 2013-06-20 NOTE — Telephone Encounter (Signed)
Pt has questions about knee swelling and using a brace Please advise

## 2013-06-22 ENCOUNTER — Telehealth: Payer: Self-pay | Admitting: Emergency Medicine

## 2013-06-22 NOTE — Telephone Encounter (Signed)
Will fwd to MD.  Kaelan Emami L, CMA  

## 2013-06-22 NOTE — Telephone Encounter (Signed)
Pt brought in her prescription bottle for oxycodone. It shows no refills left. Pharmacy is [facility] on [address]. Please advise

## 2013-06-22 NOTE — Telephone Encounter (Signed)
This was prescribed by the ED.  She will need to be seen in the office to discuss if this medication should be continued.

## 2013-06-26 ENCOUNTER — Ambulatory Visit: Payer: Medicaid Other | Admitting: Family Medicine

## 2013-06-26 NOTE — Telephone Encounter (Signed)
Pt notified.  Luka Reisch L, CMA  

## 2013-06-27 ENCOUNTER — Encounter: Payer: Self-pay | Admitting: Emergency Medicine

## 2013-06-27 ENCOUNTER — Ambulatory Visit (INDEPENDENT_AMBULATORY_CARE_PROVIDER_SITE_OTHER): Payer: Medicaid Other | Admitting: Emergency Medicine

## 2013-06-27 VITALS — BP 124/86 | HR 65 | Temp 98.4°F | Ht 61.0 in | Wt 251.0 lb

## 2013-06-27 DIAGNOSIS — M1990 Unspecified osteoarthritis, unspecified site: Secondary | ICD-10-CM

## 2013-06-27 DIAGNOSIS — M129 Arthropathy, unspecified: Secondary | ICD-10-CM

## 2013-06-27 MED ORDER — MELOXICAM 15 MG PO TABS: 15.0000 mg | ORAL_TABLET | Freq: Every day | ORAL | Status: DC

## 2013-06-27 NOTE — Assessment & Plan Note (Addendum)
Right knee most bothersome. Did not respond to cortisone injection. 11/2012 x-rays show tricompartmental disease. Will start scheduled tylenol 1g TID. Meloxicam 15mg  daily. Will place referral to orthopedics to discuss possible synvisc type injection. Discussed avoiding using narcotics.  Eventually will need knee replacement.

## 2013-06-27 NOTE — Progress Notes (Signed)
  Subjective:    Patient ID: April [name], female    DOB: 06-10-1961, 52 y.o.   MRN: [id number]  HPI April [name] is here for a SDA for right knee pain.  She reports that her right knee has been bothering her more over the last 2 weeks.  She saw Dr. Armen Pickup on 9/5 and had a cortisone injection.  Her knee was worse the next day so she went to the ED for evaluation.  They provided some percocet which she states helped the pain.  Over the next 3 days the pain gradually improved and she reports being at a 7/10 pain level today.  She recently started a job at [employer] that involves standing for 5 hours a day.  She finds that her knee is very painful at the end of the day.  Denies any swelling or redness.  No fevers or chills.  Pain worse at end of extension.  She is taking tylenol intermittently.  Has had tramadol, but reports that this lingers in her system and she does not take it.    I have reviewed and updated the following as appropriate: allergies and current medications SHx: former smoker  Review of Systems See HPI    Objective:   Physical Exam BP 124/86  Pulse 65  Temp(Src) 98.4 F (36.9 C) (Oral)  Ht 5\' 1"  (1.549 m)  Wt 251 lb (113.853 kg)  BMI 47.45 kg/m2 Gen: alert, cooperative, NAD, obese Right knee: landmarks difficult to appreciate given body habitus; no warmth or effusion; + crepitus; full range of motion with pain at end extension; tender over medial joint line      Assessment & Plan:

## 2013-06-27 NOTE — Patient Instructions (Addendum)
It was nice to see you! I'm sorry your knee is bothering you.  Start taking tylenol extra-strength 2 tablets 3 times a day. Take meloxicam 1 pill once a day.  Do not take with ibuprofen, aleve or advil.  I placed a referral to orthopedics to discuss a different type of injection.  Follow up with me in 1-2 months.

## 2013-07-07 ENCOUNTER — Telehealth: Payer: Self-pay | Admitting: Emergency Medicine

## 2013-07-07 NOTE — Telephone Encounter (Signed)
She will need to schedule an appointment to discuss this as we have not talked about it previously and she is not on any medication.

## 2013-07-07 NOTE — Telephone Encounter (Signed)
Will fwd. To PCP for review. (pt needs OV to evaluate anxiety, was not discussed at her last OV) .Arlyss Repress

## 2013-07-07 NOTE — Telephone Encounter (Signed)
Pt called and would like one of the nurses to call her form her team to discuss her anxiety. April [name]

## 2013-07-10 NOTE — Telephone Encounter (Signed)
Called pt and advised to schedule OV to be evaluated for anxiety. Pt agreed. Lorenda Hatchet, Renato Battles

## 2013-08-24 ENCOUNTER — Ambulatory Visit (INDEPENDENT_AMBULATORY_CARE_PROVIDER_SITE_OTHER): Payer: Medicaid Other | Admitting: Emergency Medicine

## 2013-08-24 ENCOUNTER — Encounter: Payer: Self-pay | Admitting: Emergency Medicine

## 2013-08-24 VITALS — BP 112/80 | HR 64 | Temp 98.0°F | Ht 60.0 in | Wt 255.0 lb

## 2013-08-24 DIAGNOSIS — F411 Generalized anxiety disorder: Secondary | ICD-10-CM

## 2013-08-24 MED ORDER — LORAZEPAM 0.5 MG PO TABS: 0.5000 mg | ORAL_TABLET | Freq: Every day | ORAL | Status: DC | PRN

## 2013-08-24 MED ORDER — BUSPIRONE HCL 7.5 MG PO TABS: 7.5000 mg | ORAL_TABLET | Freq: Two times a day (BID) | ORAL | Status: DC

## 2013-08-24 NOTE — Assessment & Plan Note (Signed)
Worse recently due to stressors. Encouraged more regular appts with her therapist. Start buspar 7.5mg  BID. Will provide Ativan 0.5mg  #30 to take daily as needed. Follow up in 1 month.

## 2013-08-24 NOTE — Patient Instructions (Addendum)
It was nice to see you! I'm sorry your having trouble with anxiety.  It may be beneficial to start seeing your therapist regularly for a while.  We are starting a medicine called Buspar.  It is for anxiety.  Take 1 pill twice a day.  It will take a few weeks to reach full effect.  I also gave you a prescription for Ativan.  This will relieve acute anxiety, but may make you sleepy.  Use it once a day as needed for anxiety.  Follow up in 1 month.
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
## 2013-10-10 ENCOUNTER — Other Ambulatory Visit: Payer: Self-pay | Admitting: Emergency Medicine

## 2013-10-10 DIAGNOSIS — L259 Unspecified contact dermatitis, unspecified cause: Secondary | ICD-10-CM

## 2013-10-10 MED ORDER — TRIAMCINOLONE ACETONIDE 0.1 % EX OINT: TOPICAL_OINTMENT | Freq: Two times a day (BID) | CUTANEOUS | Status: DC

## 2013-11-13 ENCOUNTER — Telehealth: Payer: Self-pay | Admitting: Emergency Medicine

## 2013-11-13 NOTE — Telephone Encounter (Signed)
Pt called because she said that she has a referral for [facility] and they need a referral from [place]. She said she already has one. Please check I couldn't find it. Please call her at [phone]. jw

## 2013-11-13 NOTE — Telephone Encounter (Signed)
Will FWD to [facility] in referrals.  April [name], April [name], April [name]

## 2013-11-22 ENCOUNTER — Telehealth: Payer: Self-pay | Admitting: Emergency Medicine

## 2013-11-22 NOTE — Telephone Encounter (Signed)
Would like to know of eye doctor that takes Medicaid Please advise

## 2013-11-22 NOTE — Telephone Encounter (Signed)
Left message for patient to return call. Please ask her what she needs to see an ophthalmologist for. No one accepts medicaid for routine eye screening, she must be diagnosed with diabetes or have certain eye conditions. If she just wants a screening she can go to an optometrist which will be much cheaper.Busick, Kevin Fenton

## 2013-12-22 ENCOUNTER — Ambulatory Visit (INDEPENDENT_AMBULATORY_CARE_PROVIDER_SITE_OTHER): Payer: Medicaid Other | Admitting: Emergency Medicine

## 2013-12-22 ENCOUNTER — Encounter: Payer: Self-pay | Admitting: Emergency Medicine

## 2013-12-22 VITALS — BP 113/78 | HR 72 | Temp 97.8°F | Ht 61.0 in | Wt 259.0 lb

## 2013-12-22 DIAGNOSIS — M129 Arthropathy, unspecified: Secondary | ICD-10-CM

## 2013-12-22 DIAGNOSIS — M1990 Unspecified osteoarthritis, unspecified site: Secondary | ICD-10-CM

## 2013-12-22 LAB — COMPLETE METABOLIC PANEL WITH GFR
ALBUMIN: 3.8 g/dL (ref 3.5–5.2)
ALT: 9 U/L (ref 0–35)
AST: 14 U/L (ref 0–37)
Alkaline Phosphatase: 122 U/L — ABNORMAL HIGH (ref 39–117)
BUN: 15 mg/dL (ref 6–23)
CALCIUM: 8.8 mg/dL (ref 8.4–10.5)
CHLORIDE: 103 meq/L (ref 96–112)
CO2: 28 meq/L (ref 19–32)
CREATININE: 0.52 mg/dL (ref 0.50–1.10)
GFR, Est Non African American: >89 mL/min
Glucose, Bld: 68 mg/dL — ABNORMAL LOW (ref 70–99)
Potassium: 4.2 mEq/L (ref 3.5–5.3)
Sodium: 137 mEq/L (ref 135–145)
Total Bilirubin: 0.4 mg/dL (ref 0.2–1.2)
Total Protein: 7.4 g/dL (ref 6.0–8.3)

## 2013-12-22 LAB — LIPID PANEL
Cholesterol: 190 mg/dL (ref 0–200)
HDL: 49 mg/dL (ref >39)
LDL CALC: 126 mg/dL — AB (ref 0–99)
TRIGLYCERIDES: 75 mg/dL (ref <150)
Total CHOL/HDL Ratio: 3.9 Ratio
VLDL: 15 mg/dL (ref 0–40)

## 2013-12-22 LAB — POCT GLYCOSYLATED HEMOGLOBIN (HGB A1C): Hemoglobin A1C: 5.4

## 2013-12-22 NOTE — Assessment & Plan Note (Signed)
Worsening pain in her lower back. No red flags.  Exam consistent with arthritis. Continue Meloxicam daily; tylenol arthritis TID prn. Will ordered PT. Home exercises given. F/u in 3 month or sooner if pain changes.

## 2013-12-22 NOTE — Patient Instructions (Signed)
It was nice to see you!  I have put in a referral for physical therapy. I'm not positive your insurance will pay for this. I also gave you some home exercises to do.  Do them once a day.  Continue with the daily Meloxicam. You can take the Tylenol arthritis 3-4 times a day as needed.  We are checking some blood work today.  I will call you if anything is wrong, otherwise you will get a letter with the results in the mail in 1-2 weeks.  Follow up in 3 months or sooner if pain is changing or worsening.

## 2013-12-22 NOTE — Progress Notes (Signed)
   Subjective:    Patient ID: April [name], female    DOB: January 23, 1961, 53 y.o.   MRN: [id number]  HPI Kerin R Tessier is here for f/u back pain and blood work.  Back pain She reports gradual worsening of her chronic low back arthritis pain over the last few months.  No radiation.  No bowel/bladder incontinence.  Pain is worse with prolonged standing.  States working out at [employer] helped in the past but she has been too busy with school and work to go back.  Lab work She would like to be screened for diabetes.  Current Outpatient Prescriptions on File Prior to Visit  Medication Sig Dispense Refill  . acetaminophen (TYLENOL 8 HOUR) 650 MG CR tablet Take 1 tablet (650 mg total) by mouth every 8 (eight) hours as needed for pain.  90 tablet  1  . meloxicam (MOBIC) 15 MG tablet Take 1 tablet (15 mg total) by mouth daily.  30 tablet  2  . triamcinolone ointment (KENALOG) 0.1 % Apply topically 2 (two) times daily.  30 g  3   No current facility-administered medications on file prior to visit.    I have reviewed and updated the following as appropriate: allergies, current medications and problem list SHx: non smoker  Health Maintenance: up to date on mammogram and colonoscopy; s/p hysterectomy for benign causes; declined flu shot    Review of Systems See HPI    Objective:   Physical Exam BP 113/78  Pulse 72  Temp(Src) 97.8 F (36.6 C) (Oral)  Ht 5\' 1"  (1.549 m)  Wt 259 lb (117.482 kg)  BMI 48.96 kg/m2 Gen: alert, cooperative, NAD Back: no obvious deformity; no vertebral tenderness; tender over bilateral lumbar facet joints and paraspinous muscles; SLR negative bilaterally; FABER negative bilaterally; patellar and achilles reflexes are diminished but symmetric     Assessment & Plan:

## 2013-12-22 NOTE — Assessment & Plan Note (Signed)
[facility]. Will check screening labs today - cmp, a1c, lipid.

## 2013-12-25 ENCOUNTER — Encounter: Payer: Self-pay | Admitting: Emergency Medicine

## 2014-01-05 ENCOUNTER — Ambulatory Visit: Payer: Medicaid Other | Attending: Emergency Medicine | Admitting: Physical Therapy

## 2014-01-05 DIAGNOSIS — M129 Arthropathy, unspecified: Secondary | ICD-10-CM | POA: Insufficient documentation

## 2014-01-05 DIAGNOSIS — IMO0001 Reserved for inherently not codable concepts without codable children: Secondary | ICD-10-CM | POA: Insufficient documentation

## 2014-01-05 DIAGNOSIS — M2550 Pain in unspecified joint: Secondary | ICD-10-CM | POA: Insufficient documentation

## 2014-03-28 ENCOUNTER — Telehealth: Payer: Self-pay | Admitting: Emergency Medicine

## 2014-03-28 DIAGNOSIS — M1990 Unspecified osteoarthritis, unspecified site: Secondary | ICD-10-CM

## 2014-03-28 DIAGNOSIS — L259 Unspecified contact dermatitis, unspecified cause: Secondary | ICD-10-CM

## 2014-03-28 NOTE — Telephone Encounter (Signed)
Refill request for Tylenol, Mobic and Kenalog.

## 2014-03-29 MED ORDER — TRIAMCINOLONE ACETONIDE 0.1 % EX OINT: TOPICAL_OINTMENT | Freq: Two times a day (BID) | CUTANEOUS | Status: DC

## 2014-03-29 MED ORDER — MELOXICAM 15 MG PO TABS: 15.0000 mg | ORAL_TABLET | Freq: Every day | ORAL | Status: DC

## 2014-03-29 NOTE — Telephone Encounter (Signed)
Left message for patient to return call. Please read message from Dr Bridgett Larsson.Busick, Kevin Fenton

## 2014-03-29 NOTE — Telephone Encounter (Signed)
Patient returned call and was given message.[name], April Fenton

## 2014-03-29 NOTE — Telephone Encounter (Signed)
I have refilled the meloxicam and kenalog.  She can get the tylenol over the counter.

## 2014-04-19 ENCOUNTER — Encounter: Payer: Self-pay | Admitting: Family Medicine

## 2014-04-19 ENCOUNTER — Ambulatory Visit (INDEPENDENT_AMBULATORY_CARE_PROVIDER_SITE_OTHER): Payer: Medicaid Other | Admitting: Family Medicine

## 2014-04-19 VITALS — BP 111/68 | HR 78 | Ht 60.0 in | Wt 245.0 lb

## 2014-04-19 DIAGNOSIS — M1990 Unspecified osteoarthritis, unspecified site: Secondary | ICD-10-CM

## 2014-04-19 DIAGNOSIS — M129 Arthropathy, unspecified: Secondary | ICD-10-CM

## 2014-04-19 NOTE — Patient Instructions (Signed)
Knee Pain Knee pain can be a result of an injury or other medical conditions. Treatment will depend on the cause of your pain. HOME CARE  Only take medicine as told by your doctor.  Keep a healthy weight. Being overweight can make the knee hurt more.  Stretch before exercising or playing sports.  If there is constant knee pain, change the way you exercise. Ask your doctor for advice.  Make sure shoes fit well. Choose the right shoe for the sport or activity.  Protect your knees. Wear kneepads if needed.  Rest when you are tired. GET HELP RIGHT AWAY IF:   Your knee pain does not stop.  Your knee pain does not get better.  Your knee joint feels hot to the touch.  You have a fever. MAKE SURE YOU:   Understand these instructions.  Will watch this condition.  Will get help right away if you are not doing well or get worse. Document Released: 12/25/2008 Document Revised: 12/21/2011 Document Reviewed: 12/25/2008 [facility] Patient Information 2015 [place], [place]. This information is not intended to replace advice given to you by your health care provider. Make sure you discuss any questions you have with your health care provider.   Arthritis, Nonspecific Arthritis is inflammation of a joint. This usually means pain, redness, warmth or swelling are present. One or more joints may be involved. There are a number of types of arthritis. Your caregiver may not be able to tell what type of arthritis you have right away. CAUSES  The most common cause of arthritis is the wear and tear on the joint (osteoarthritis). This causes damage to the cartilage, which can break down over time. The knees, hips, back and neck are most often affected by this type of arthritis. Other types of arthritis and common causes of joint pain include:  Sprains and other injuries near the joint. Sometimes minor sprains and injuries cause pain and swelling that develop hours later.  Rheumatoid arthritis. This  affects hands, feet and knees. It usually affects both sides of your body at the same time. It is often associated with chronic ailments, fever, weight loss and general weakness.  Crystal arthritis. Gout and pseudo gout can cause occasional acute severe pain, redness and swelling in the foot, ankle, or knee.  Infectious arthritis. Bacteria can get into a joint through a break in overlying skin. This can cause infection of the joint. Bacteria and viruses can also spread through the blood and affect your joints.  Drug, infectious and allergy reactions. Sometimes joints can become mildly painful and slightly swollen with these types of illnesses. SYMPTOMS   Pain is the main symptom.  Your joint or joints can also be red, swollen and warm or hot to the touch.  You may have a fever with certain types of arthritis, or even feel overall ill.  The joint with arthritis will hurt with movement. Stiffness is present with some types of arthritis. DIAGNOSIS  Your caregiver will suspect arthritis based on your description of your symptoms and on your exam. Testing may be needed to find the type of arthritis:  Blood and sometimes urine tests.  X-ray tests and sometimes CT or MRI scans.  Removal of fluid from the joint (arthrocentesis) is done to check for bacteria, crystals or other causes. Your caregiver (or a specialist) will numb the area over the joint with a local anesthetic, and use a needle to remove joint fluid for examination. This procedure is only minimally uncomfortable.  Even  with these tests, your caregiver may not be able to tell what kind of arthritis you have. Consultation with a specialist (rheumatologist) may be helpful. TREATMENT  Your caregiver will discuss with you treatment specific to your type of arthritis. If the specific type cannot be determined, then the following general recommendations may apply. Treatment of severe joint pain  includes:  Rest.  Elevation.  Anti-inflammatory medication (for example, ibuprofen) may be prescribed. Avoiding activities that cause increased pain.  Only take over-the-counter or prescription medicines for pain and discomfort as recommended by your caregiver.  Cold packs over an inflamed joint may be used for 10 to 15 minutes every hour. Hot packs sometimes feel better, but do not use overnight. Do not use hot packs if you are diabetic without your caregiver's permission.  A cortisone shot into arthritic joints may help reduce pain and swelling.  Any acute arthritis that gets worse over the next 1 to 2 days needs to be looked at to be sure there is no joint infection. Long-term arthritis treatment involves modifying activities and lifestyle to reduce joint stress jarring. This can include weight loss. Also, exercise is needed to nourish the joint cartilage and remove waste. This helps keep the muscles around the joint strong. HOME CARE INSTRUCTIONS   Do not take aspirin to relieve pain if gout is suspected. This elevates uric acid levels.  Only take over-the-counter or prescription medicines for pain, discomfort or fever as directed by your caregiver.  Rest the joint as much as possible.  If your joint is swollen, keep it elevated.  Use crutches if the painful joint is in your leg.  Drinking plenty of fluids may help for certain types of arthritis.  Follow your caregiver's dietary instructions.  Try low-impact exercise such as:  Swimming.  Water aerobics.  Biking.  Walking.  Morning stiffness is often relieved by a warm shower.  Put your joints through regular range-of-motion. SEEK MEDICAL CARE IF:   You do not feel better in 24 hours or are getting worse.  You have side effects to medications, or are not getting better with treatment. SEEK IMMEDIATE MEDICAL CARE IF:   You have a fever.  You develop severe joint pain, swelling or redness.  Many joints are  involved and become painful and swollen.  There is severe back pain and/or leg weakness.  You have loss of bowel or bladder control. Document Released: 11/05/2004 Document Revised: 12/21/2011 Document Reviewed: 11/21/2008 [facility] Patient Information 2015 [place], [place]. This information is not intended to replace advice given to you by your health care provider. Make sure you discuss any questions you have with your health care provider.  I am sorry you continue to have pain.  I advise you to call [facility] and make sure you are not due for another shot in your knee.  Continue to try to decrease your weight and perform low impact exercise at least 150 minutes a week.

## 2014-04-19 NOTE — Assessment & Plan Note (Signed)
Continued bilateral knee pain R>L Established with April [name] and receiving Viscous shots. They have recommended surgery and weight loss.  Unfortunately there is not more we can offer today. I advised her to call April [name] and make certain she is not due for another shot.  Recommended weight loss. She asked if we prescribe weight loss pills, and we do not. Educated her on the side effects and lack of evidence in weight loss pills and advised her if this the option she wants she will need to seek another clinic for treatment for those.  Pt declined nutrition consult. Recommended 150 minutes a week of exercise, and continue weight loss regimen by diet modifications.

## 2014-04-19 NOTE — Progress Notes (Signed)
   Subjective:    Patient ID: April [name], female    DOB: 1961/05/09, 53 y.o.   MRN: [id number]  HPI April [name] is a 53 y.o. AAF with history of arthritis.   Bilateral Knee pain: Patient has been with bilateral knee pain for over 2 years. She states her R>L knee pain, more medial pain. She has been receiving viscous and steroid injections from [facility] and states she is not due for another shot, per her. She has been "running" and exercising for the last few months. In addition she states she has seen a nutritionist for weight loss in the past. She states her knee pain is 7/10 and she has taken mobic and tylenol for the pain. She wants to try diet pills or diuretics to help her lose weight.   Review of Systems Per HPI    Objective:   Physical Exam BP 111/68  Pulse 78  Ht 5' (1.524 m)  Wt 245 lb (111.131 kg)  BMI 47.85 kg/m2 Gen: NAD. Morbidly obese AAF.  Bilateral Knee: No erythema, no edema, no effusions. Mild TTP medial joint line bilateral knees. No ligament laxity, negative lachmans.

## 2014-04-24 ENCOUNTER — Ambulatory Visit: Payer: Medicaid Other | Admitting: Family Medicine

## 2014-05-04 ENCOUNTER — Ambulatory Visit: Payer: Medicaid Other | Admitting: Family Medicine

## 2014-05-11 ENCOUNTER — Ambulatory Visit: Payer: Medicaid Other | Admitting: Family Medicine

## 2014-05-15 ENCOUNTER — Telehealth: Payer: Self-pay | Admitting: Family Medicine

## 2014-05-15 DIAGNOSIS — M1711 Unilateral primary osteoarthritis, right knee: Secondary | ICD-10-CM

## 2014-05-15 NOTE — Telephone Encounter (Signed)
Pt called and would like a referral to go to [facility]. jw

## 2014-05-15 NOTE — Telephone Encounter (Signed)
Let message on patient voicemail to call back with more information.

## 2014-05-15 NOTE — Telephone Encounter (Signed)
Patient saw Dr. Raoul Pitch in clinic on 04/19/14 for OA of knees.  She is an established patient of [facility].  Renewed referral submitted.

## 2014-05-15 NOTE — Telephone Encounter (Signed)
Patient has been going to [facility] for knee injections, referral has expired and needs a new one. Will forward to PCP.

## 2014-05-28 ENCOUNTER — Encounter: Payer: Self-pay | Admitting: Family Medicine

## 2014-05-28 ENCOUNTER — Ambulatory Visit (INDEPENDENT_AMBULATORY_CARE_PROVIDER_SITE_OTHER): Payer: Medicaid Other | Admitting: Family Medicine

## 2014-05-28 ENCOUNTER — Ambulatory Visit (HOSPITAL_COMMUNITY)
Admission: RE | Admit: 2014-05-28 | Discharge: 2014-05-28 | Disposition: A | Discharge status: Home / Self Care | Payer: Medicaid Other | Source: Ambulatory Visit | Attending: Family Medicine | Admitting: Family Medicine

## 2014-05-28 VITALS — BP 136/76 | HR 111 | Temp 98.1°F | Ht 61.0 in | Wt 257.5 lb

## 2014-05-28 DIAGNOSIS — Z01810 Encounter for preprocedural cardiovascular examination: Secondary | ICD-10-CM | POA: Diagnosis present

## 2014-05-28 DIAGNOSIS — Z791 Long term (current) use of non-steroidal anti-inflammatories (NSAID): Secondary | ICD-10-CM | POA: Insufficient documentation

## 2014-05-28 DIAGNOSIS — M1711 Unilateral primary osteoarthritis, right knee: Secondary | ICD-10-CM

## 2014-05-28 DIAGNOSIS — E669 Obesity, unspecified: Secondary | ICD-10-CM | POA: Insufficient documentation

## 2014-05-28 DIAGNOSIS — IMO0002 Reserved for concepts with insufficient information to code with codable children: Secondary | ICD-10-CM

## 2014-05-28 DIAGNOSIS — L259 Unspecified contact dermatitis, unspecified cause: Secondary | ICD-10-CM | POA: Insufficient documentation

## 2014-05-28 DIAGNOSIS — M1710 Unilateral primary osteoarthritis, unspecified knee: Secondary | ICD-10-CM

## 2014-05-28 MED ORDER — TRIAMCINOLONE ACETONIDE 0.1 % EX OINT: TOPICAL_OINTMENT | Freq: Two times a day (BID) | CUTANEOUS | Status: DC

## 2014-05-28 NOTE — Assessment & Plan Note (Signed)
Preoperative cardiac evaluation completed today. Scheduled for right TKR in 09/24/14 with Dr. Noemi Chapel. - RCRI score of 0 with cardiac risk of 0.4%.   - ACS NSQIR cardiac risk of <1%.   - EKG normal sinus rhythm.  - Recent labs within normal limits.  - No history of lung, cardiac or cerebrovascular disease. Only risk factor for cardiovascular disease is obesity.  - Advised patient to stop taking meloxicam one day prior to surgery. She does not take any anticoagulants. - She is deemed to be at a low cardiac risk for intermediate risk surgery.

## 2014-05-28 NOTE — Assessment & Plan Note (Addendum)
Patient interested in trying to lose weight. Encouraged exercise at least 5 times a week for 30 minutes this time. Gave handout with ideas for different types of exercise. Encouraged step-wise improvement of diet as well. Patient set goal of trying to eat more vegetables in place of carbohydrates. Offered nutrition referral but patient declined.

## 2014-05-28 NOTE — Assessment & Plan Note (Signed)
Stable.  Refill triamcinolone ointment. Encourage patient to use Aquaphor lotion throughout the day to keep area moist. Advised patient to watch our for hypopigmentation.

## 2014-05-28 NOTE — Patient Instructions (Signed)
It was really nice to meet you today.  We completed a pre-operative evaluation for your upcoming knee replacement. Your echocardiogram looked good. You are at a low risk for cardiac events during this upcoming surgery.  Stop taking your meloxicam 24 hours prior to your surgery.  For your eczema: Keep using the triamcinolone ointment twice daily. You can also try to use Aquaphor lotion. This will help to keep the area moist during the day in between applications of the triamcinolone. Watch out for light spots on her skin or ear applying the steroid cream. This is a possible adverse effects of the steroids.  Call the clinic or come back to see [place] if you have any new or worsening problems.  Best, Dr. Jacinto Reap Brita Romp)   Exercise to Lose Weight Exercise and a healthy diet may help you lose weight. Your doctor may suggest specific exercises. EXERCISE IDEAS AND TIPS  Choose low-cost things you enjoy doing, such as walking, bicycling, or exercising to workout videos.  Take stairs instead of the elevator.  Walk during your lunch break.  Park your car further away from work or school.  Go to a gym or an exercise class.  Start with 5 to 10 minutes of exercise each day. Build up to 30 minutes of exercise 4 to 6 days a week.  Wear shoes with good support and comfortable clothes.  Stretch before and after working out.  Work out until you breathe harder and your heart beats faster.  Drink extra water when you exercise.  Do not do so much that you hurt yourself, feel dizzy, or get very short of breath. Exercises that burn about 150 calories:  Running 1  miles in 15 minutes.  Playing volleyball for 45 to 60 minutes.  Washing and waxing a car for 45 to 60 minutes.  Playing touch football for 45 minutes.  Walking 1  miles in 35 minutes.  Pushing a stroller 1  miles in 30 minutes.  Playing basketball for 30 minutes.  Raking leaves for 30 minutes.  Bicycling 5 miles in 30  minutes.  Walking 2 miles in 30 minutes.  Dancing for 30 minutes.  Shoveling snow for 15 minutes.  Swimming laps for 20 minutes.  Walking up stairs for 15 minutes.  Bicycling 4 miles in 15 minutes.  Gardening for 30 to 45 minutes.  Jumping rope for 15 minutes.  Washing windows or floors for 45 to 60 minutes. Document Released: 10/31/2010 Document Revised: 12/21/2011 Document Reviewed: 10/31/2010 [facility] Patient Information 2015 [place], [place]. This information is not intended to replace advice given to you by your health care provider. Make sure you discuss any questions you have with your health care provider.

## 2014-05-28 NOTE — Progress Notes (Signed)
Subjective:   April [name] is a 53 y.o. female with a history of obesity and eczema here for pre-op cardiac eval and eczema f/u.  1. Pre-op cardiac eval: April [name] has no history of cardiovascular disease, lung disease, diabetes, or stroke.  Her only cardiac risk factor is obesity. She reports dyspnea when walking up a flight of stairs in her home, but she is able to walk 2 miles at the gym without becoming short of breath.  She does not take any anticoagulants.  She is independent in all ADLs.   2. Eczema: April [name] reports a long term history of eczema mostly concentrated on the back of her neck and arms. She uses triamcinolone ointment twice daily and believes this helps.  3. Obesity: Patient is interested in losing weight.  She is exercising at the gym several times per week.  She thinks that she eats too many sweets and not enough vegetables.   Review of Systems:  Per HPI. All other systems reviewed and are negative.    Past Medical History: Patient Active Problem List   Diagnosis Date Noted  . Arthritis 04/19/2013  . Osteoarthritis of right knee 12/01/2012  . Healthcare maintenance 12/01/2012  . Low back pain 01/27/2012  . Leg cramps 11/28/2010  . GERD 03/22/2009  . Anxiety state, unspecified 11/14/2008  . ECZEMA 05/18/2008  . DISORDER, DEPRESSIVE NEC 03/10/2007  . HYPERCHOLESTEROLEMIA 12/09/2006  . Morbid obesity 12/09/2006  . ANEMIA, OTHER, UNSPECIFIED 12/09/2006  . HEMORRHOIDS, NOS 12/09/2006    Medications: reviewed and updated Current Outpatient Prescriptions  Medication Sig Dispense Refill  . acetaminophen (TYLENOL 8 HOUR) 650 MG CR tablet Take 1 tablet (650 mg total) by mouth every 8 (eight) hours as needed for pain.  90 tablet  1  . meloxicam (MOBIC) 15 MG tablet Take 1 tablet (15 mg total) by mouth daily.  30 tablet  2  . triamcinolone ointment (KENALOG) 0.1 % Apply topically 2 (two) times daily.  30 g  3   No current facility-administered  medications for this visit.    Social history: Former smoker  Objective:  BP 136/76  Pulse 111  Temp(Src) 98.1 F (36.7 C) (Oral)  Ht 5\' 1"  (1.549 m)  Wt 257 lb 8 oz (116.801 kg)  BMI 48.68 kg/m2  Gen:  53 y.o. female in NAD HEENT: NCAT, MMM, PERRL, anicteric sclerae Neck: Patches of hyperpigmented eczema noted on back of the neck CV: RRR, no M/R/G, no JVD Resp: Non-labored, CTAB, no wheezes noted Abd: Soft, NTND, BS present, no guarding or organomegaly MSK: No effusions or swelling.  No TTP in b/l knees, FROM. Crepitus in R knee and pain with extension of R knee.  Ext: WWP, no edema Neuro: Alert and oriented, speech normal. Strength 5/5 in b/l UEs and LEs.      Chemistry      Component Value Date/Time   NA 137 12/22/2013 0906   K 4.2 12/22/2013 0906   CL 103 12/22/2013 0906   CO2 28 12/22/2013 0906   BUN 15 12/22/2013 0906   CREATININE 0.52 12/22/2013 0906   CREATININE 0.51 11/28/2010 1818      Component Value Date/Time   CALCIUM 8.8 12/22/2013 0906   ALKPHOS 122* 12/22/2013 0906   AST 14 12/22/2013 0906   ALT 9 12/22/2013 0906   BILITOT 0.4 12/22/2013 0906      Lab Results  Component Value Date   WBC 7.3 12/01/2012   HGB 12.3 12/01/2012   HCT 37.1  12/01/2012   MCV 80.7 12/01/2012   PLT 359 12/01/2012   Lab Results  Component Value Date   TSH 1.121 12/01/2012   Lab Results  Component Value Date   HGBA1C 5.4 12/22/2013    EKG (05/28/14): NSR, rate 72bpm, no evidence of ischemia  Assessment:     April [name] is a 53 y.o. female here for the operative cardiac evaluation for her scheduled right TKR in 12/15 and eczema and obesity.    Plan:     See problem list for problem-specific plans.   April Paganini, MD PGY-1,  [facility] 05/28/2014  2:55 PM

## 2014-08-15 ENCOUNTER — Encounter: Payer: Self-pay | Admitting: Family Medicine

## 2014-08-15 ENCOUNTER — Ambulatory Visit (INDEPENDENT_AMBULATORY_CARE_PROVIDER_SITE_OTHER): Payer: Medicaid Other | Admitting: Family Medicine

## 2014-08-15 VITALS — BP 130/86 | HR 67 | Temp 98.5°F | Ht 61.0 in | Wt 256.8 lb

## 2014-08-15 DIAGNOSIS — M1990 Unspecified osteoarthritis, unspecified site: Secondary | ICD-10-CM

## 2014-08-15 DIAGNOSIS — M179 Osteoarthritis of knee, unspecified: Secondary | ICD-10-CM

## 2014-08-15 DIAGNOSIS — M1711 Unilateral primary osteoarthritis, right knee: Secondary | ICD-10-CM

## 2014-08-15 MED ORDER — MELOXICAM 15 MG PO TABS: 15.0000 mg | ORAL_TABLET | Freq: Every day | ORAL | Status: DC

## 2014-08-15 MED ORDER — CAPSAICIN-MENTHOL-METHYL SAL 0.025-1-12 % EX CREA: TOPICAL_CREAM | CUTANEOUS | Status: DC

## 2014-08-15 NOTE — Progress Notes (Signed)
   Subjective:   April [name] is a 53 y.o. female with a history of obesity, R knee pain here for obesity and knee pain follow-up.  1. Obesity: Patient is trying to you less sweets. She reports urges stressful to shop for groceries and cooked dinner. This is exacerbated by her daughter eating the food that she brings home and bringing junk food into the house.  2. Right knee pain: Patient seen in August for pre-op cardiac eval. She is scheduled for right TKR on 09/24/14. Her orthopedic surgeon recently added diclofenac 50 mg twice a day for pain. She is taking this in combination with her daily meloxicam. She is trying to schedule for a joint replacement class that Ortho recommended.she is requesting a work note so that she may be able to have the breaks that she supposed to have and be able to sit down during the day.  Review of Systems:  Per HPI. All other systems reviewed and are negative.   PMH, PSH, Medications, Allergies, and FmHx reviewed and updated in EMR.  Social History: former smoker  Objective:  BP 130/86 mmHg  Pulse 67  Temp(Src) 98.5 F (36.9 C) (Oral)  Ht 5\' 1"  (1.549 m)  Wt 256 lb 12.8 oz (116.484 kg)  BMI 48.55 kg/m2  Gen:  53 y.o. female in NAD CV: RRR, no MRG Resp: Non-labored, CTAB, no wheezes noted Ext: WWP, no edema MSK: Strength intact, ROM of right knee painful but full. Crepitus appreciated. Neuro: Alert and oriented, speech normal    Assessment:     April [name] is a 53 y.o. female here for knee pain, obesity follow-up.    Plan:     See problem list for problem-specific plans.   [name], MD PGY-1,  [facility] 08/15/2014  1:44 PM

## 2014-08-15 NOTE — Patient Instructions (Addendum)
It was nice to see you again.  Continue to take the Meloxicam as needed for knee pain.  Stop taking Diclofenac.  You can try the Capsaicin cream that I sent to the pharmacy.  Try to call Ortho about the joint replacement class.  Osteoarthritis Osteoarthritis is a disease that causes soreness and inflammation of a joint. It occurs when the cartilage at the affected joint wears down. Cartilage acts as a cushion, covering the ends of bones where they meet to form a joint. Osteoarthritis is the most common form of arthritis. It often occurs in older people. The joints affected most often by this condition include those in the:  Ends of the fingers.  Thumbs.  Neck.  Lower back.  Knees.  Hips. CAUSES  Over time, the cartilage that covers the ends of bones begins to wear away. This causes bone to rub on bone, producing pain and stiffness in the affected joints.  RISK FACTORS Certain factors can increase your chances of having osteoarthritis, including:  Older age.  Excessive body weight.  Overuse of joints.  Previous joint injury. SIGNS AND SYMPTOMS   Pain, swelling, and stiffness in the joint.  Over time, the joint may lose its normal shape.  Small deposits of bone (osteophytes) may grow on the edges of the joint.  Bits of bone or cartilage can break off and float inside the joint space. This may cause more pain and damage. DIAGNOSIS  Your health care provider will do a physical exam and ask about your symptoms. Various tests may be ordered, such as:  X-rays of the affected joint.  An MRI scan.  Blood tests to rule out other types of arthritis.  Joint fluid tests. This involves using a needle to draw fluid from the joint and examining the fluid under a microscope. TREATMENT  Goals of treatment are to control pain and improve joint function. Treatment plans may include:  A prescribed exercise program that allows for rest and joint relief.  A weight control plan.  Pain  relief techniques, such as:  Properly applied heat and cold.  Electric pulses delivered to nerve endings under the skin (transcutaneous electrical nerve stimulation [TENS]).  Massage.  Certain nutritional supplements.  Medicines to control pain, such as:  Acetaminophen.  Nonsteroidal anti-inflammatory drugs (NSAIDs), such as naproxen.  Narcotic or central-acting agents, such as tramadol.  Corticosteroids. These can be given orally or as an injection.  Surgery to reposition the bones and relieve pain (osteotomy) or to remove loose pieces of bone and cartilage. Joint replacement may be needed in advanced states of osteoarthritis. HOME CARE INSTRUCTIONS   Take medicines only as directed by your health care provider.  Maintain a healthy weight. Follow your health care provider's instructions for weight control. This may include dietary instructions.  Exercise as directed. Your health care provider can recommend specific types of exercise. These may include:  Strengthening exercises. These are done to strengthen the muscles that support joints affected by arthritis. They can be performed with weights or with exercise bands to add resistance.  Aerobic activities. These are exercises, such as brisk walking or low-impact aerobics, that get your heart pumping.  Range-of-motion activities. These keep your joints limber.  Balance and agility exercises. These help you maintain daily living skills.  Rest your affected joints as directed by your health care provider.  Keep all follow-up visits as directed by your health care provider. SEEK MEDICAL CARE IF:   Your skin turns red.  You develop a  rash in addition to your joint pain.  You have worsening joint pain.  You have a fever along with joint or muscle aches. SEEK IMMEDIATE MEDICAL CARE IF:  You have a significant loss of weight or appetite.  You have night sweats. [place] of Arthritis  and Musculoskeletal and Skin Diseases: www.niams.SouthExposed.es  [employer] on Aging: http://kim-miller.com/  American College of Rheumatology: www.rheumatology.org Document Released: 09/28/2005 Document Revised: 02/12/2014 Document Reviewed: 06/05/2013 [facility] Patient Information 2015 [place], [place]. This information is not intended to replace advice given to you by your health care provider. Make sure you discuss any questions you have with your health care provider.

## 2014-08-15 NOTE — Assessment & Plan Note (Signed)
Encourage patient to continue to work on weight loss. Offered advice it may be best for daughter to eat healthier as well.

## 2014-08-15 NOTE — Assessment & Plan Note (Signed)
Awaiting right TKR on 09/24/14 with orthopedic surgery. - Continue meloxicam daily for pain. - d/c diclofenac. - Rx for capsaicin cream sent to pharmacy - work note given for accommodations

## 2014-08-31 ENCOUNTER — Encounter (HOSPITAL_COMMUNITY): Payer: Self-pay | Admitting: Emergency Medicine

## 2014-08-31 ENCOUNTER — Emergency Department (HOSPITAL_COMMUNITY)
Admission: EM | Admit: 2014-08-31 | Discharge: 2014-08-31 | Disposition: A | Discharge status: Home / Self Care | Payer: Medicaid Other | Attending: Emergency Medicine | Admitting: Emergency Medicine

## 2014-08-31 DIAGNOSIS — Z791 Long term (current) use of non-steroidal anti-inflammatories (NSAID): Secondary | ICD-10-CM | POA: Diagnosis not present

## 2014-08-31 DIAGNOSIS — G44209 Tension-type headache, unspecified, not intractable: Secondary | ICD-10-CM | POA: Insufficient documentation

## 2014-08-31 DIAGNOSIS — G8929 Other chronic pain: Secondary | ICD-10-CM | POA: Diagnosis not present

## 2014-08-31 DIAGNOSIS — Z87891 Personal history of nicotine dependence: Secondary | ICD-10-CM | POA: Diagnosis not present

## 2014-08-31 DIAGNOSIS — E669 Obesity, unspecified: Secondary | ICD-10-CM | POA: Insufficient documentation

## 2014-08-31 DIAGNOSIS — Z862 Personal history of diseases of the blood and blood-forming organs and certain disorders involving the immune mechanism: Secondary | ICD-10-CM | POA: Insufficient documentation

## 2014-08-31 DIAGNOSIS — Z8659 Personal history of other mental and behavioral disorders: Secondary | ICD-10-CM | POA: Insufficient documentation

## 2014-08-31 DIAGNOSIS — Z872 Personal history of diseases of the skin and subcutaneous tissue: Secondary | ICD-10-CM | POA: Insufficient documentation

## 2014-08-31 DIAGNOSIS — Z62810 Personal history of physical and sexual abuse in childhood: Secondary | ICD-10-CM | POA: Diagnosis not present

## 2014-08-31 DIAGNOSIS — Z79899 Other long term (current) drug therapy: Secondary | ICD-10-CM | POA: Insufficient documentation

## 2014-08-31 DIAGNOSIS — R51 Headache: Secondary | ICD-10-CM | POA: Diagnosis present

## 2014-08-31 MED ORDER — METHOCARBAMOL 500 MG PO TABS: 500.0000 mg | ORAL_TABLET | Freq: Three times a day (TID) | ORAL | Status: DC | PRN

## 2014-08-31 MED ORDER — CYCLOBENZAPRINE HCL 10 MG PO TABS
5.0000 mg | ORAL_TABLET | Freq: Once | ORAL | Status: AC
  Administered 2014-08-31: 5 mg via ORAL
  Filled 2014-08-31: qty 1
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
## 2014-08-31 NOTE — Discharge Instructions (Signed)
Tension Headache °A tension headache is pain, pressure, or aching felt over the front and sides of the head. Tension headaches often come after stress, feeling worried (anxiety), or feeling sad or down for a while (depressed). °HOME CARE °· Only take medicine as told by your doctor. °· Lie down in a dark, quiet room when you have a headache. °· Keep a journal to find out if certain things bring on headaches. For example, write down: °¨ What you eat and drink. °¨ How much sleep you get. °¨ Any change to your diet or medicines. °· Relax by getting a massage or doing other relaxing activities. °· Put ice or heat packs on the head and neck area as told by your doctor. °· Lessen stress. °· Sit up straight. Do not tighten (tense) your muscles. °· Quit smoking if you smoke. °· Lessen how much alcohol you drink. °· Lessen how much caffeine you drink, or stop drinking caffeine. °· Eat and exercise regularly. °· Get enough sleep. °· Avoid using too much pain medicine. °GET HELP RIGHT AWAY IF:  °· Your headache becomes really bad. °· You have a fever. °· You have a stiff neck. °· You have trouble seeing. °· Your muscles are weak, or you lose muscle control. °· You lose your balance or have trouble walking. °· You feel like you will pass out (faint), or you pass out. °· You have really bad symptoms that are different than your first symptoms. °· You have problems with the medicines given to you by your doctor. °· Your medicines do not work. °· Your headache feels different than the other headaches. °· You feel sick to your stomach (nauseous) or throw up (vomit). °MAKE SURE YOU:  °· Understand these instructions. °· Will watch your condition. °· Will get help right away if you are not doing well or get worse. °Document Released: 12/23/2009 Document Revised: 12/21/2011 Document Reviewed: 09/18/2011 °ExitCare® Patient Information ©2015 [employer]. This information is not intended to replace advice given to you by your health  care provider. Make sure you discuss any questions you have with your health care provider. ° °

## 2014-08-31 NOTE — ED Notes (Signed)
Pt. reports headache for 3 days mostly when waking up in the morning , denies injury , no nausea or vomitting .
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
## 2014-09-13 NOTE — Pre-Procedure Instructions (Signed)
VIHA KRIEGEL  09/13/2014   Your procedure is scheduled on: Monday, Dec. 14th   Report to [facility] Admitting at  7:20 AM.   Call this number if you have problems the morning of surgery: [phone]   Remember:   Do not eat food or drink liquids after midnight Sunday.   Take these medicines the morning of surgery with A SIP OF WATER: Robaxin   Do not wear jewelry, make-up or nail polish.  Do not wear lotions, powders, or perfumes. You may NOT wear deodorant.  Do not shave underarms & legs 48 hours prior to surgery.   Do not bring valuables to the hospital.  [facility] is not responsible for any belongings or valuables.               Contacts, dentures or bridgework may not be worn into surgery.  Leave suitcase in the car. After surgery it may be brought to your room.  For patients admitted to the hospital, discharge time is determined by your treatment team.                 Name and phone number of your driver:    Special Instructions: "Preparing for Surgery" instruction sheet.   Please read over the following fact sheets that you were given: Pain Booklet, Coughing and Deep Breathing, Blood Transfusion Information, MRSA Information and Surgical Site Infection Prevention

## 2014-09-14 ENCOUNTER — Encounter (HOSPITAL_COMMUNITY)
Admission: RE | Admit: 2014-09-14 | Discharge: 2014-09-14 | Disposition: A | Discharge status: Home / Self Care | Payer: Medicaid Other | Source: Ambulatory Visit | Attending: Orthopedic Surgery | Admitting: Orthopedic Surgery

## 2014-09-24 ENCOUNTER — Encounter (HOSPITAL_COMMUNITY): Admission: RE | Payer: Self-pay | Source: Ambulatory Visit

## 2014-09-24 ENCOUNTER — Inpatient Hospital Stay (HOSPITAL_COMMUNITY): Admission: RE | Admit: 2014-09-24 | Payer: Medicaid Other | Source: Ambulatory Visit | Admitting: Orthopedic Surgery

## 2014-09-24 SURGERY — ARTHROPLASTY, KNEE, TOTAL
Anesthesia: General | Laterality: Right

## 2014-10-31 ENCOUNTER — Ambulatory Visit (INDEPENDENT_AMBULATORY_CARE_PROVIDER_SITE_OTHER): Payer: Medicaid Other | Admitting: Family Medicine

## 2014-10-31 VITALS — BP 124/72 | HR 72 | Temp 97.9°F | Wt 256.0 lb

## 2014-10-31 DIAGNOSIS — L309 Dermatitis, unspecified: Secondary | ICD-10-CM

## 2014-10-31 DIAGNOSIS — R1904 Left lower quadrant abdominal swelling, mass and lump: Secondary | ICD-10-CM

## 2014-10-31 MED ORDER — TRIAMCINOLONE ACETONIDE 0.1 % EX OINT: TOPICAL_OINTMENT | Freq: Two times a day (BID) | CUTANEOUS | Status: DC

## 2014-10-31 NOTE — Progress Notes (Signed)
    Subjective   April [name] is a 54 y.o. female that presents for a same day visit  1. Enlarging stomach: First noticed last year. Says it has gotten bigger since then on the left side. S/p partial hysterectomy in 2011 for menorrhagia. No history of ovarian cancer. No unexplained weight loss. Last pap smear one year ago. She reports some pain when she moves. Last colonoscopy was in 2014 that showed mild diverticulosis.  History  Substance Use Topics  . Smoking status: Former Research scientist (life sciences)  . Smokeless tobacco: Never Used  . Alcohol Use: No    ROS Per HPI  Objective   BP 124/72 mmHg  Pulse 72  Temp(Src) 97.9 F (36.6 C) (Oral)  Wt 256 lb (116.121 kg)  General: Well appearing female, in no distress Gastrointestinal: Soft mass located at LLQ. Not reducible and not tender. Abdomen overall is not distended Genitourinary: Some mild tenderness with bimanual exam      Assessment and Plan   LLQ mass: unsure of what this could be. 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   CT abdomen pelvis  Healthcare maintenance:  Patient requires pap smear. Discussed this with patient and adjusted medical record. According to surgical note and path, does not appear that cervix was removed

## 2014-10-31 NOTE — Patient Instructions (Signed)
Thank you for coming to see me today. It was a pleasure. Today we talked about:   Abdominal mass: Since I am unsure of what this mass is, I will get a CT scan of your stomach and pelvis. I will call you with the results.  Please make an appointment to see Dr. Brita Romp when next scheduled  If you have any questions or concerns, please do not hesitate to call the office at [phone].  Sincerely,  Cordelia Poche, MD

## 2014-11-07 ENCOUNTER — Ambulatory Visit (HOSPITAL_COMMUNITY)
Admission: RE | Admit: 2014-11-07 | Discharge: 2014-11-07 | Disposition: A | Discharge status: Home / Self Care | Payer: Medicaid Other | Source: Ambulatory Visit | Attending: Family Medicine | Admitting: Family Medicine

## 2014-11-07 ENCOUNTER — Encounter (HOSPITAL_COMMUNITY): Payer: Self-pay

## 2014-11-07 ENCOUNTER — Telehealth: Payer: Self-pay | Admitting: Family Medicine

## 2014-11-07 DIAGNOSIS — R1904 Left lower quadrant abdominal swelling, mass and lump: Secondary | ICD-10-CM

## 2014-11-07 DIAGNOSIS — K469 Unspecified abdominal hernia without obstruction or gangrene: Secondary | ICD-10-CM | POA: Diagnosis not present

## 2014-11-07 DIAGNOSIS — R1032 Left lower quadrant pain: Secondary | ICD-10-CM | POA: Diagnosis present

## 2014-11-07 DIAGNOSIS — Z90710 Acquired absence of both cervix and uterus: Secondary | ICD-10-CM | POA: Insufficient documentation

## 2014-11-07 MED ORDER — IOHEXOL 300 MG/ML  SOLN
100.0000 mL | Freq: Once | INTRAMUSCULAR | Status: AC | PRN
  Administered 2014-11-07: 100 mL via INTRAVENOUS

## 2014-11-07 NOTE — Telephone Encounter (Signed)
Wants results from todays CT scan

## 2014-11-08 NOTE — Telephone Encounter (Signed)
I think that Dr. Lonny Prude should review this with patient, as he saw patient and ordered test.  I will be happy to follow-up with her in the future.  [name], MD, MPH PGY-1,  [facility] 11/08/2014 2:51 PM

## 2014-11-12 NOTE — Telephone Encounter (Signed)
Wants CT scan results She has called about this before

## 2014-11-12 NOTE — Telephone Encounter (Signed)
I called the patient and had a lengthy conversation regarding results. Discussed results. Reassured patient from clinical and radiological imaging that it does not appear mass does not appear to represent cancer. Discussed options for management including surgical repair and conservative management. Recommended conservative management with analgesics and abdominal binders. Discussed red flags that would be concerning for bowel incarceration/strangulation. Informed that if her symptoms are every bad enough, she can call us to be referred to a surgeon, however, this will be an elective procedure. Encouraged follow-up with PCP for physical as she has not had a pap smear in many years (incorrectly listed as not needing one after hysterectomy, although from notes, it appears cervix was retained). Patient voiced understanding. Will call with further questions.

## 2014-12-03 ENCOUNTER — Telehealth: Payer: Self-pay | Admitting: Family Medicine

## 2014-12-03 NOTE — Telephone Encounter (Signed)
Needs anxiety medicine

## 2014-12-04 NOTE — Telephone Encounter (Signed)
Patient has not been seen in clinic or had medications (Buspar and prn Ativan) filled for anxiety since 08/2013.  She needs to be seen in clinic to discuss anxiety before further medications given.  [name], MD, MPH PGY-1,  [facility] 12/04/2014 12:49 PM

## 2014-12-04 NOTE — Telephone Encounter (Signed)
LMOVM for pt to call us back. Odessia Asleson, CMA  

## 2014-12-06 NOTE — Telephone Encounter (Signed)
Left another message for patient, patient already has appointment scheduled for 3/2.

## 2014-12-11 ENCOUNTER — Ambulatory Visit (INDEPENDENT_AMBULATORY_CARE_PROVIDER_SITE_OTHER): Payer: Medicaid Other | Admitting: Family Medicine

## 2014-12-11 ENCOUNTER — Encounter: Payer: Self-pay | Admitting: Family Medicine

## 2014-12-11 VITALS — BP 136/84 | HR 67 | Temp 98.0°F | Ht 60.0 in | Wt 258.4 lb

## 2014-12-11 DIAGNOSIS — F411 Generalized anxiety disorder: Secondary | ICD-10-CM

## 2014-12-11 DIAGNOSIS — M179 Osteoarthritis of knee, unspecified: Secondary | ICD-10-CM

## 2014-12-11 DIAGNOSIS — L309 Dermatitis, unspecified: Secondary | ICD-10-CM

## 2014-12-11 DIAGNOSIS — Z780 Asymptomatic menopausal state: Secondary | ICD-10-CM

## 2014-12-11 DIAGNOSIS — M1711 Unilateral primary osteoarthritis, right knee: Secondary | ICD-10-CM

## 2014-12-11 MED ORDER — ESCITALOPRAM OXALATE 10 MG PO TABS: 10.0000 mg | ORAL_TABLET | Freq: Every day | ORAL | Status: DC

## 2014-12-11 MED ORDER — MELOXICAM 15 MG PO TABS: 15.0000 mg | ORAL_TABLET | Freq: Every day | ORAL | Status: DC

## 2014-12-11 MED ORDER — TRIAMCINOLONE ACETONIDE 0.1 % EX OINT: TOPICAL_OINTMENT | Freq: Two times a day (BID) | CUTANEOUS | Status: DC

## 2014-12-11 NOTE — Assessment & Plan Note (Signed)
Awaiting right TKR in 5/16 with orthopedic surgery. Continue meloxicam daily for pain

## 2014-12-11 NOTE — Assessment & Plan Note (Signed)
Will start escitalopram 10 mg daily Follow-up in one month and consider increasing dose to 20 mg daily at that time if needed

## 2014-12-11 NOTE — Assessment & Plan Note (Signed)
Information given to patient Advised patient about the risks of hormone replacement therapy and she does not wish to do this at this time.

## 2014-12-11 NOTE — Assessment & Plan Note (Signed)
Use triamcinolone twice a day. Advised patient to keep moisturized throughout the day Advised patient about risk of hypopigmentation
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
## 2014-12-11 NOTE — Patient Instructions (Addendum)
It was nice to see you again today.    Start taking Escitalopram daily for anxiety.  It takes a couple weeks to start working.  Come back to see me in 1 month.  Take Care, Dr. B  Menopause Menopause is the normal time of life when menstrual periods stop completely. Menopause is complete when you have missed 12 consecutive menstrual periods. It usually occurs between the ages of 39 years and 7 years. Very rarely does a woman develop menopause before the age of 3 years. At menopause, your ovaries stop producing the female hormones estrogen and progesterone. This can cause undesirable symptoms and also affect your health. Sometimes the symptoms may occur 4-5 years before the menopause begins. There is no relationship between menopause and:  Oral contraceptives.  Number of children you had.  Race.  The age your menstrual periods started (menarche). Heavy smokers and very thin women may develop menopause earlier in life. CAUSES  The ovaries stop producing the female hormones estrogen and progesterone.  Other causes include:  Surgery to remove both ovaries.  The ovaries stop functioning for no known reason.  Tumors of the pituitary gland in the brain.  Medical disease that affects the ovaries and hormone production.  Radiation treatment to the abdomen or pelvis.  Chemotherapy that affects the ovaries. SYMPTOMS   Hot flashes.  Night sweats.  Decrease in sex drive.  Vaginal dryness and thinning of the vagina causing painful intercourse.  Dryness of the skin and developing wrinkles.  Headaches.  Tiredness.  Irritability.  Memory problems.  Weight gain.  Bladder infections.  Hair growth of the face and chest.  Infertility. More serious symptoms include:  Loss of bone (osteoporosis) causing breaks (fractures).  Depression.  Hardening and narrowing of the arteries (atherosclerosis) causing heart attacks and strokes. DIAGNOSIS   When the menstrual periods  have stopped for 12 straight months.  Physical exam.  Hormone studies of the blood. TREATMENT  There are many treatment choices and nearly as many questions about them. The decisions to treat or not to treat menopausal changes is an individual choice made with your health care provider. Your health care provider can discuss the treatments with you. Together, you can decide which treatment will work best for you. Your treatment choices may include:   Hormone therapy (estrogen and progesterone).  Non-hormonal medicines.  Treating the individual symptoms with medicine (for example antidepressants for depression).  Herbal medicines that may help specific symptoms.  Counseling by a psychiatrist or psychologist.  Group therapy.  Lifestyle changes including:  Eating healthy.  Regular exercise.  Limiting caffeine and alcohol.  Stress management and meditation.  No treatment. HOME CARE INSTRUCTIONS   Take the medicine your health care provider gives you as directed.  Get plenty of sleep and rest.  Exercise regularly.  Eat a diet that contains calcium (good for the bones) and soy products (acts like estrogen hormone).  Avoid alcoholic beverages.  Do not smoke.  If you have hot flashes, dress in layers.  Take supplements, calcium, and vitamin D to strengthen bones.  You can use over-the-counter lubricants or moisturizers for vaginal dryness.  Group therapy is sometimes very helpful.  Acupuncture may be helpful in some cases. SEEK MEDICAL CARE IF:   You are not sure you are in menopause.  You are having menopausal symptoms and need advice and treatment.  You are still having menstrual periods after age 51 years.  You have pain with intercourse.  Menopause is complete (no menstrual  period for 12 months) and you develop vaginal bleeding.  You need a referral to a specialist (gynecologist, psychiatrist, or psychologist) for treatment. SEEK IMMEDIATE MEDICAL CARE  IF:   You have severe depression.  You have excessive vaginal bleeding.  You fell and think you have a broken bone.  You have pain when you urinate.  You develop leg or chest pain.  You have a fast pounding heart beat (palpitations).  You have severe headaches.  You develop vision problems.  You feel a lump in your breast.  You have abdominal pain or severe indigestion. Document Released: 12/19/2003 Document Revised: 05/31/2013 Document Reviewed: 04/27/2013 [facility] Patient Information 2015 [place], [place]. This information is not intended to replace advice given to you by your health care provider. Make sure you discuss any questions you have with your health care provider.

## 2014-12-11 NOTE — Progress Notes (Signed)
I was the preceptor on the day of this visit.

## 2014-12-28 ENCOUNTER — Telehealth: Payer: Self-pay | Admitting: Family Medicine

## 2014-12-28 ENCOUNTER — Other Ambulatory Visit: Payer: Self-pay | Admitting: Family Medicine

## 2014-12-28 DIAGNOSIS — M1711 Unilateral primary osteoarthritis, right knee: Secondary | ICD-10-CM

## 2014-12-28 NOTE — Telephone Encounter (Signed)
Hoy Morn, we just need a new referral placed, her old one is expired. We can take care of the NPI number.

## 2014-12-28 NOTE — Telephone Encounter (Signed)
Can you see if this can be gotten from Ms Everette Rank?  Levada Dy is out of town and I have no clue what her NPI number is.

## 2014-12-28 NOTE — Telephone Encounter (Signed)
Pt need referral to [facility] office for right knee injection before her surgery for knee replacement in May.  Pt have [place] medicaid so they will need NPI number.

## 2014-12-28 NOTE — Telephone Encounter (Signed)
Ok. Done 

## 2015-01-01 NOTE — Telephone Encounter (Signed)
[place] office for NPI authorization.  Pt to be called by their office for an appt

## 2015-02-15 ENCOUNTER — Encounter: Payer: Medicaid Other | Admitting: Family Medicine

## 2015-02-22 ENCOUNTER — Encounter: Payer: Self-pay | Admitting: Family Medicine

## 2015-02-22 ENCOUNTER — Other Ambulatory Visit (HOSPITAL_COMMUNITY)
Admission: RE | Admit: 2015-02-22 | Discharge: 2015-02-22 | Disposition: A | Discharge status: Home / Self Care | Payer: Medicaid Other | Source: Ambulatory Visit | Attending: Family Medicine | Admitting: Family Medicine

## 2015-02-22 ENCOUNTER — Ambulatory Visit (INDEPENDENT_AMBULATORY_CARE_PROVIDER_SITE_OTHER): Payer: Medicaid Other | Admitting: Family Medicine

## 2015-02-22 VITALS — BP 108/72 | HR 72 | Ht 60.0 in | Wt 259.0 lb

## 2015-02-22 DIAGNOSIS — F329 Major depressive disorder, single episode, unspecified: Secondary | ICD-10-CM

## 2015-02-22 DIAGNOSIS — F32A Depression, unspecified: Secondary | ICD-10-CM

## 2015-02-22 DIAGNOSIS — Z124 Encounter for screening for malignant neoplasm of cervix: Secondary | ICD-10-CM | POA: Diagnosis not present

## 2015-02-22 DIAGNOSIS — Z01419 Encounter for gynecological examination (general) (routine) without abnormal findings: Secondary | ICD-10-CM | POA: Diagnosis not present

## 2015-02-22 DIAGNOSIS — Z0000 Encounter for general adult medical examination without abnormal findings: Secondary | ICD-10-CM | POA: Diagnosis not present

## 2015-02-22 DIAGNOSIS — M1711 Unilateral primary osteoarthritis, right knee: Secondary | ICD-10-CM

## 2015-02-22 DIAGNOSIS — Z1151 Encounter for screening for human papillomavirus (HPV): Secondary | ICD-10-CM | POA: Diagnosis present

## 2015-02-22 DIAGNOSIS — F411 Generalized anxiety disorder: Secondary | ICD-10-CM

## 2015-02-22 DIAGNOSIS — M179 Osteoarthritis of knee, unspecified: Secondary | ICD-10-CM | POA: Diagnosis not present

## 2015-02-22 DIAGNOSIS — R202 Paresthesia of skin: Secondary | ICD-10-CM | POA: Diagnosis not present

## 2015-02-22 LAB — BASIC METABOLIC PANEL
BUN: 17 mg/dL (ref 6–23)
CO2: 20 mEq/L (ref 19–32)
Calcium: 9 mg/dL (ref 8.4–10.5)
Chloride: 104 mEq/L (ref 96–112)
Creat: 0.51 mg/dL (ref 0.50–1.10)
Glucose, Bld: 87 mg/dL (ref 70–99)
POTASSIUM: 4.7 meq/L (ref 3.5–5.3)
Sodium: 136 mEq/L (ref 135–145)

## 2015-02-22 LAB — LIPID PANEL
CHOL/HDL RATIO: 4.2 ratio
CHOLESTEROL: 210 mg/dL — AB (ref 0–200)
HDL: 50 mg/dL (ref >=46)
LDL CALC: 143 mg/dL — AB (ref 0–99)
Triglycerides: 87 mg/dL (ref <150)
VLDL: 17 mg/dL (ref 0–40)

## 2015-02-22 LAB — POCT GLYCOSYLATED HEMOGLOBIN (HGB A1C): HEMOGLOBIN A1C: 5.3

## 2015-02-22 MED ORDER — FLUOXETINE HCL 20 MG PO TABS: 20.0000 mg | ORAL_TABLET | Freq: Every day | ORAL | Status: DC

## 2015-02-22 NOTE — Assessment & Plan Note (Signed)
Reassured about upcoming TKR F/u with Ortho prn
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
## 2015-02-22 NOTE — Assessment & Plan Note (Signed)
Encouraged continued work on diet and exercise Hemoglobin A1c, lipid panel, bmet today

## 2015-02-22 NOTE — Assessment & Plan Note (Signed)
Suspect this is related to parasympathetic activation of sweat glands during exercise Encourage slow warmup before any exercise

## 2015-02-22 NOTE — Assessment & Plan Note (Signed)
Pap smear collected today Patient due for mammogram and given information about breast center to call for scheduling mammogram Up-to-date on colonoscopy

## 2015-02-22 NOTE — Assessment & Plan Note (Signed)
suspect mood changes are related to anxiety and depression Symptoms not indicative of bipolar disorder Did not tolerate Lexapro Start Prozac 20 mg daily Encourage patient to call [facility] psychology department for therapy Follow-up in 2 weeks

## 2015-02-22 NOTE — Assessment & Plan Note (Signed)
suspect mood changes are related to anxiety and depression Did not tolerate Lexapro Start Prozac 20 mg daily Encourage patient to call [facility] psychology department for therapy Follow-up in 2 weeks

## 2015-02-22 NOTE — Patient Instructions (Signed)
Nice to see you again today.  Someone will call you or send you a letter with results soon.  Stop taking Lexapro and start taking Prozac daily.  Come back to see me in 2-4 weeks to see how the medication is doing.  Slow warm ups for exercise to help with the pins and needles sensation.  Take care, Dr. Jacinto Reap

## 2015-02-25 ENCOUNTER — Encounter: Payer: Self-pay | Admitting: Family Medicine

## 2015-02-25 NOTE — Progress Notes (Signed)
I was preceptor the day of this visit.   

## 2015-02-26 LAB — CYTOLOGY - PAP

## 2015-02-28 ENCOUNTER — Telehealth: Payer: Self-pay | Admitting: *Deleted

## 2015-02-28 ENCOUNTER — Encounter: Payer: Self-pay | Admitting: *Deleted

## 2015-02-28 NOTE — Telephone Encounter (Signed)
Patient informed, expressed understanding. 

## 2015-02-28 NOTE — Telephone Encounter (Signed)
-----   Message from [name], MD sent at 02/27/2015  3:23 PM EDT ----- Reviewed Pap smear results. Normal with negative HPV.  Retest in 5 years.  Please let patient know.  [name], MD, MPH PGY-1,  [facility] 02/27/2015 3:23 PM

## 2015-03-06 ENCOUNTER — Encounter: Payer: Self-pay | Admitting: Physician Assistant

## 2015-03-06 DIAGNOSIS — M1711 Unilateral primary osteoarthritis, right knee: Secondary | ICD-10-CM | POA: Diagnosis present
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
## 2015-03-07 NOTE — Pre-Procedure Instructions (Signed)
April [name]  03/07/2015      [id number] [address] [name], [name] - [name] [id number] Phone: [phone] Fax: [phone]    Your procedure is scheduled on Mon, June 6 @ 10:00 AM  Report to [facility]  at 8:00 AM  Call this number if you have problems the morning of surgery:  [phone]   Remember:  Do not eat food or drink liquids after midnight.  Take these medicines the morning of surgery with A SIP OF WATER:Fluoxetine(Prozac)              Stop taking your Mobic. No Goody's,BC's,Aleve,Aspirin,Ibuprofen,Fish Oil,or any Herbal Medications.    Do not wear jewelry, make-up or nail polish.  Do not wear lotions, powders, or perfumes.  You may wear deodorant.  Do not shave 48 hours prior to surgery.    Do not bring valuables to the hospital.  [facility] is not responsible for any belongings or valuables.  Contacts, dentures or bridgework may not be worn into surgery.  Leave your suitcase in the car.  After surgery it may be brought to your room.  For patients admitted to the hospital, discharge time will be determined by your treatment team.  Patients discharged the day of surgery will not be allowed to drive home.    Special instructions:  [facility] - Preparing for Surgery  Before surgery, you can play an important role.  Because skin is not sterile, your skin needs to be as free of germs as possible.  You can reduce the number of germs on you skin by washing with CHG (chlorahexidine gluconate) soap before surgery.  CHG is an antiseptic cleaner which kills germs and bonds with the skin to continue killing germs even after washing.  Please DO NOT use if you have an allergy to CHG or antibacterial soaps.  If your skin becomes reddened/irritated stop using the CHG and inform your nurse when you arrive at Short Stay.  Do not shave (including legs and underarms) for at least 48 hours prior  to the first CHG shower.  You may shave your face.  Please follow these instructions carefully:   1.  Shower with CHG Soap the night before surgery and the                                morning of Surgery.  2.  If you choose to wash your hair, wash your hair first as usual with your       normal shampoo.  3.  After you shampoo, rinse your hair and body thoroughly to remove the                      Shampoo.  4.  Use CHG as you would any other liquid soap.  You can apply chg directly       to the skin and wash gently with scrungie or a clean washcloth.  5.  Apply the CHG Soap to your body ONLY FROM THE NECK DOWN.        Do not use on open wounds or open sores.  Avoid contact with your eyes,       ears, mouth and genitals (private parts).  Wash genitals (private parts)       with your normal soap.  6.  Wash thoroughly, paying special  attention to the area where your surgery        will be performed.  7.  Thoroughly rinse your body with warm water from the neck down.  8.  DO NOT shower/wash with your normal soap after using and rinsing off       the CHG Soap.  9.  Pat yourself dry with a clean towel.            10.  Wear clean pajamas.            11.  Place clean sheets on your bed the night of your first shower and do not        sleep with pets.  Day of Surgery  Do not apply any lotions/deoderants the morning of surgery.  Please wear clean clothes to the hospital/surgery center.    Please read over the following fact sheets that you were given. Pain Booklet, Coughing and Deep Breathing, Blood Transfusion Information, MRSA Information and Surgical Site Infection Prevention

## 2015-03-08 ENCOUNTER — Encounter (HOSPITAL_COMMUNITY)
Admission: RE | Admit: 2015-03-08 | Discharge: 2015-03-08 | Disposition: A | Discharge status: Home / Self Care | Payer: Medicaid Other | Source: Ambulatory Visit | Attending: Orthopedic Surgery | Admitting: Orthopedic Surgery

## 2015-03-08 ENCOUNTER — Encounter (HOSPITAL_COMMUNITY): Payer: Self-pay

## 2015-03-08 DIAGNOSIS — Z01818 Encounter for other preprocedural examination: Secondary | ICD-10-CM | POA: Diagnosis not present

## 2015-03-08 DIAGNOSIS — M1711 Unilateral primary osteoarthritis, right knee: Secondary | ICD-10-CM | POA: Insufficient documentation

## 2015-03-08 DIAGNOSIS — Z6841 Body Mass Index (BMI) 40.0 and over, adult: Secondary | ICD-10-CM | POA: Diagnosis not present

## 2015-03-08 DIAGNOSIS — Z0183 Encounter for blood typing: Secondary | ICD-10-CM | POA: Diagnosis not present

## 2015-03-08 DIAGNOSIS — K219 Gastro-esophageal reflux disease without esophagitis: Secondary | ICD-10-CM | POA: Insufficient documentation

## 2015-03-08 DIAGNOSIS — Z87891 Personal history of nicotine dependence: Secondary | ICD-10-CM | POA: Diagnosis not present

## 2015-03-08 DIAGNOSIS — Z01812 Encounter for preprocedural laboratory examination: Secondary | ICD-10-CM | POA: Insufficient documentation

## 2015-03-08 DIAGNOSIS — E780 Pure hypercholesterolemia: Secondary | ICD-10-CM | POA: Insufficient documentation

## 2015-03-08 HISTORY — DX: Paresthesia of skin: R20.2

## 2015-03-08 LAB — URINALYSIS, ROUTINE W REFLEX MICROSCOPIC
Bilirubin Urine: NEGATIVE
Glucose, UA: NEGATIVE mg/dL
HGB URINE DIPSTICK: NEGATIVE
Ketones, ur: NEGATIVE mg/dL
LEUKOCYTES UA: NEGATIVE
NITRITE: NEGATIVE
PROTEIN: NEGATIVE mg/dL
Specific Gravity, Urine: 1.024 (ref 1.005–1.030)
Urobilinogen, UA: 0.2 mg/dL (ref 0.0–1.0)
pH: 7.5 (ref 5.0–8.0)

## 2015-03-08 LAB — COMPREHENSIVE METABOLIC PANEL
ALK PHOS: 144 U/L — AB (ref 38–126)
ALT: 12 U/L — ABNORMAL LOW (ref 14–54)
AST: 20 U/L (ref 15–41)
Albumin: 3.8 g/dL (ref 3.5–5.0)
Anion gap: 8 (ref 5–15)
BUN: 13 mg/dL (ref 6–20)
CHLORIDE: 104 mmol/L (ref 101–111)
CO2: 26 mmol/L (ref 22–32)
Calcium: 9 mg/dL (ref 8.9–10.3)
Creatinine, Ser: 0.55 mg/dL (ref 0.44–1.00)
GLUCOSE: 84 mg/dL (ref 65–99)
Potassium: 3.9 mmol/L (ref 3.5–5.1)
Sodium: 138 mmol/L (ref 135–145)
TOTAL PROTEIN: 8.3 g/dL — AB (ref 6.5–8.1)
Total Bilirubin: 0.5 mg/dL (ref 0.3–1.2)

## 2015-03-08 LAB — SURGICAL PCR SCREEN
MRSA, PCR: NEGATIVE
STAPHYLOCOCCUS AUREUS: NEGATIVE

## 2015-03-08 LAB — CBC WITH DIFFERENTIAL/PLATELET
BASOS ABS: 0 10*3/uL (ref 0.0–0.1)
Basophils Relative: 0 % (ref 0–1)
EOS PCT: 2 % (ref 0–5)
Eosinophils Absolute: 0.1 10*3/uL (ref 0.0–0.7)
HCT: 40.5 % (ref 36.0–46.0)
Hemoglobin: 13.1 g/dL (ref 12.0–15.0)
Lymphocytes Relative: 45 % (ref 12–46)
Lymphs Abs: 2.3 10*3/uL (ref 0.7–4.0)
MCH: 27.5 pg (ref 26.0–34.0)
MCHC: 32.3 g/dL (ref 30.0–36.0)
MCV: 84.9 fL (ref 78.0–100.0)
Monocytes Absolute: 0.4 10*3/uL (ref 0.1–1.0)
Monocytes Relative: 7 % (ref 3–12)
Neutro Abs: 2.4 10*3/uL (ref 1.7–7.7)
Neutrophils Relative %: 46 % (ref 43–77)
Platelets: 295 10*3/uL (ref 150–400)
RBC: 4.77 MIL/uL (ref 3.87–5.11)
RDW: 14.2 % (ref 11.5–15.5)
WBC: 5.2 10*3/uL (ref 4.0–10.5)

## 2015-03-08 LAB — TYPE AND SCREEN
ABO/RH(D): B POS
Antibody Screen: NEGATIVE

## 2015-03-08 LAB — PROTIME-INR
INR: 1.08 (ref 0.00–1.49)
Prothrombin Time: 14.2 seconds (ref 11.6–15.2)

## 2015-03-08 LAB — APTT: APTT: 28 s (ref 24–37)

## 2015-03-08 LAB — ABO/RH: ABO/RH(D): B POS

## 2015-03-09 LAB — URINE CULTURE

## 2015-03-11 ENCOUNTER — Encounter (HOSPITAL_COMMUNITY): Payer: Self-pay | Admitting: Emergency Medicine

## 2015-03-18 ENCOUNTER — Inpatient Hospital Stay (HOSPITAL_COMMUNITY): Admission: RE | Admit: 2015-03-18 | Payer: Medicaid Other | Source: Ambulatory Visit | Admitting: Orthopedic Surgery

## 2015-03-18 ENCOUNTER — Encounter (HOSPITAL_COMMUNITY): Admission: RE | Payer: Self-pay | Source: Ambulatory Visit

## 2015-03-18 HISTORY — DX: Unilateral primary osteoarthritis, right knee: M17.11

## 2015-03-18 SURGERY — ARTHROPLASTY, KNEE, TOTAL
Anesthesia: General | Laterality: Right

## 2015-04-17 ENCOUNTER — Emergency Department (HOSPITAL_COMMUNITY)
Admission: EM | Admit: 2015-04-17 | Discharge: 2015-04-18 | Discharge status: Against Medical Advice | Payer: Medicaid Other

## 2015-04-19 ENCOUNTER — Encounter: Payer: Self-pay | Admitting: Family Medicine

## 2015-04-19 ENCOUNTER — Ambulatory Visit (INDEPENDENT_AMBULATORY_CARE_PROVIDER_SITE_OTHER): Payer: Medicaid Other | Admitting: Family Medicine

## 2015-04-19 VITALS — BP 128/91 | HR 74 | Temp 98.6°F | Ht 60.0 in | Wt 262.2 lb

## 2015-04-19 DIAGNOSIS — L505 Cholinergic urticaria: Secondary | ICD-10-CM | POA: Insufficient documentation

## 2015-04-19 DIAGNOSIS — R202 Paresthesia of skin: Secondary | ICD-10-CM | POA: Diagnosis not present

## 2015-04-19 MED ORDER — DIPHENHYDRAMINE HCL 50 MG PO TABS
50.0000 mg | ORAL_TABLET | Freq: Every day | ORAL | Status: DC | PRN
Start: 2015-04-19 — End: 2015-04-30

## 2015-04-19 NOTE — Assessment & Plan Note (Signed)
Presumptive Dx. Will try benadryl prn to confirm.

## 2015-04-19 NOTE — Assessment & Plan Note (Signed)
Not certain of the pathogenesis between exercise and this sensation. Most likely to me is an atypical urticaria caused by exercise vs. just heat-induced. Would be later presentation than most. Unlikely to be hyperventilation-induced paresthesias. Will try empiric anti-histamine for prevention. Will follow up with PCP. If this is effective, would continue more selective, less sedating 2nd gen antihistamine.

## 2015-04-19 NOTE — Progress Notes (Signed)
Subjective: April [name] is a 54 y.o. female presenting for pins and needles sensation during exercise.  She reports feeling pins and needles paresthesias when she sweats with exercise but not at other times. This has happened for about 4 months. It produces a red rash that is irritating and at times pruritic. Lesions and sensation seem to go away upon resting every time. She denies this occuring in hot baths/showers.   Has a history of this and was evaluated by PCP who suspected parasympathetic overactivation and encouraged slow warmup before any exercise. She has tried this without improvement.   - ROS: No fevers, body swelling, difficulty breathing, environmental allergies.   Objective: BP 128/91 mmHg  Pulse 74  Temp(Src) 98.6 F (37 C) (Oral)  Ht 5' (1.524 m)  Wt 262 lb 3.2 oz (118.933 kg)  BMI 51.21 kg/m2 Gen: Well-appearing, morbidly obese 54 y.o. female in no distress Skin: No rashes, wounds, ulcers. No swelling.   Assessment/Plan: April [name] is a 54 y.o. 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

## 2015-04-19 NOTE — Patient Instructions (Signed)
Try taking benadryl before exercise and let your PCP know if this helps. It's possible that you are having a skin reaction to heat or exertion.

## 2015-04-30 ENCOUNTER — Encounter: Payer: Self-pay | Admitting: Family Medicine

## 2015-04-30 ENCOUNTER — Ambulatory Visit (INDEPENDENT_AMBULATORY_CARE_PROVIDER_SITE_OTHER): Payer: Medicaid Other | Admitting: Family Medicine

## 2015-04-30 ENCOUNTER — Ambulatory Visit: Payer: Medicaid Other | Admitting: Family Medicine

## 2015-04-30 VITALS — BP 124/72 | HR 72 | Temp 98.1°F | Ht 60.0 in | Wt 260.3 lb

## 2015-04-30 DIAGNOSIS — M179 Osteoarthritis of knee, unspecified: Secondary | ICD-10-CM | POA: Diagnosis not present

## 2015-04-30 DIAGNOSIS — L309 Dermatitis, unspecified: Secondary | ICD-10-CM | POA: Diagnosis present

## 2015-04-30 DIAGNOSIS — Z135 Encounter for screening for eye and ear disorders: Secondary | ICD-10-CM | POA: Diagnosis not present

## 2015-04-30 DIAGNOSIS — F329 Major depressive disorder, single episode, unspecified: Secondary | ICD-10-CM

## 2015-04-30 DIAGNOSIS — M1711 Unilateral primary osteoarthritis, right knee: Secondary | ICD-10-CM

## 2015-04-30 DIAGNOSIS — R202 Paresthesia of skin: Secondary | ICD-10-CM | POA: Diagnosis not present

## 2015-04-30 DIAGNOSIS — F32A Depression, unspecified: Secondary | ICD-10-CM

## 2015-04-30 DIAGNOSIS — F411 Generalized anxiety disorder: Secondary | ICD-10-CM | POA: Diagnosis not present

## 2015-04-30 MED ORDER — TRIAMCINOLONE ACETONIDE 0.1 % EX OINT: TOPICAL_OINTMENT | Freq: Two times a day (BID) | CUTANEOUS | Status: DC

## 2015-04-30 NOTE — Assessment & Plan Note (Signed)
Patient not taking Prozac as prescribed Suspect that she confused it with prn benzos that she used to take Mood seems stable today Resume Prozac 20 mg daily Follow-up in one month with possibility of increasing dose We'll continue to encourage counseling in addition of medication

## 2015-04-30 NOTE — Assessment & Plan Note (Signed)
Refilled Kenalog cream

## 2015-04-30 NOTE — Assessment & Plan Note (Signed)
Encourage patient to continue to work on diet and exercise Emphasized more realistic weight loss goals such as 1 pound per week

## 2015-04-30 NOTE — Assessment & Plan Note (Signed)
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

## 2015-04-30 NOTE — Patient Instructions (Signed)
Nice to see you again today. You can stop taking the Benadryl.  Start taking the Prozac 20 mg daily again. I will see you back in one month to see how this is doing.  Take care,  Dr. Jacinto Reap

## 2015-04-30 NOTE — Assessment & Plan Note (Signed)
Ongoing plan per ortho

## 2015-04-30 NOTE — Progress Notes (Signed)
   Subjective:   April [name] is a 54 y.o. female with a history of right knee osteoarthritis, anxiety, depression, obesity, eczema here for follow-up on right knee osteoarthritis, obesity, pins and needles sensation when sweating, anxiety/depression.  R knee OA: - cancelled surgery last month - wasn't ready to commit to TKR at this time and Medicaid said it wasn't covered at that time - going back to Ortho in November - planning for surgery in December over school holiday - now wearing knee brace per ortho  Obesity:  trying to work on diet - watching portion sizes now, feels like she already eats healthy foods - would like to lose 15-16 lbs  ?Cholinergic urticaria - still getting pins and needles sensation when she sweats - feels like a hot flash - doesn't think benadryl helped - no rash, but skin gets red  Depression/anxiety - only taking prozac prn - feels like prozac made her sleepy - doesn't think she needs it anymore - off for 1 month - doesn't think anxiety is severe - thinks she needs medication for a time while she processes things in her past - did not call [facility] - uninterested in therapy at this time - trying to focus more on herself now that school is out  Hasn't had her eyes checked in a long time - wants referral back to Optometrist  Review of Systems:  Per HPI. All other systems reviewed and are negative.   PMH, PSH, Medications, Allergies, and FmHx reviewed and updated in EMR.  Social History: Former smoker  Objective:  BP 124/72 mmHg  Pulse 72  Temp(Src) 98.1 F (36.7 C) (Oral)  Ht 5' (1.524 m)  Wt 260 lb 4.8 oz (118.071 kg)  BMI 50.84 kg/m2  Gen:  54 y.o. female in NAD HEENT: NCAT, MMM, EOMI, PERRL, anicteric sclerae CV: RRR, no MRG, intact distal pulses Resp: Non-labored, CTAB, no wheezes noted Skin: No rashes noted Ext: WWP, no edema Neuro: Alert and oriented, speech normal      Chemistry      Component Value Date/Time   NA  138 03/08/2015 1009   K 3.9 03/08/2015 1009   CL 104 03/08/2015 1009   CO2 26 03/08/2015 1009   BUN 13 03/08/2015 1009   CREATININE 0.55 03/08/2015 1009   CREATININE 0.51 02/22/2015 1646      Component Value Date/Time   CALCIUM 9.0 03/08/2015 1009   ALKPHOS 144* 03/08/2015 1009   AST 20 03/08/2015 1009   ALT 12* 03/08/2015 1009   BILITOT 0.5 03/08/2015 1009      Lab Results  Component Value Date   WBC 5.2 03/08/2015   HGB 13.1 03/08/2015   HCT 40.5 03/08/2015   MCV 84.9 03/08/2015   PLT 295 03/08/2015   Lab Results  Component Value Date   TSH 1.121 12/01/2012   Lab Results  Component Value Date   HGBA1C 5.3 02/22/2015   Assessment:     April [name] is a 54 y.o. female here for R Knee OA, anxiety/depression, pins and needle sensation, obesity.    Plan:     See problem list for problem-specific plans.   [name], MD PGY-2,  [facility] 04/30/2015  1:59 PM

## 2015-05-01 ENCOUNTER — Encounter: Payer: Self-pay | Admitting: Clinical

## 2015-05-01 NOTE — Progress Notes (Signed)
CSW has sent a referral to [facility] for a vision exam. The waitng list is about 2 months out.  Hunt Oris, MSW, [place]

## 2015-07-03 ENCOUNTER — Ambulatory Visit (INDEPENDENT_AMBULATORY_CARE_PROVIDER_SITE_OTHER): Payer: Medicaid Other | Admitting: Family Medicine

## 2015-07-03 ENCOUNTER — Encounter: Payer: Self-pay | Admitting: Family Medicine

## 2015-07-03 VITALS — BP 126/61 | HR 70 | Temp 98.3°F | Ht 61.0 in | Wt 265.7 lb

## 2015-07-03 DIAGNOSIS — M129 Arthropathy, unspecified: Secondary | ICD-10-CM | POA: Diagnosis not present

## 2015-07-03 DIAGNOSIS — M1711 Unilateral primary osteoarthritis, right knee: Secondary | ICD-10-CM

## 2015-07-03 MED ORDER — TRAMADOL-ACETAMINOPHEN 37.5-325 MG PO TABS
1.0000 | ORAL_TABLET | Freq: Four times a day (QID) | ORAL | Status: DC | PRN
Start: 2015-07-03 — End: 2015-09-27

## 2015-07-03 NOTE — Patient Instructions (Signed)
Thanks for coming in today.   We will start Ultracet for you knee pain.   Follow up with orthopedics regarding your knee replacement.   If there is anything else we can do just let us know.   Work on Nurse, adult quad muscles at [employer] prior to your surgery. This will help to give you a good outcome.   Thanks for letting us take care of you.   Sincerely,  Paula Compton, MD Family Medicine - PGY 2

## 2015-07-05 NOTE — Progress Notes (Signed)
Patient ID: April [name], female   DOB: 1960-11-18, 54 y.o.   MRN: [id number]   [facility] Aquilla Hacker, MD Phone: [phone]  Subjective:   # Bilateral Knee Pain - pt. Here for same day appointment due to worsening knee pain.  - she has known arthritis, has received multiple injections which are no longer helping.  - she has been referred to orthopedics for evaluation for knee replacement, but backed out because she was concerned about the surgery.  - she is now reconsidering and has an appointment with orthopedics in October - she says that she is getting little relief from nsaids any more.  - her knee has not been swelling much, is not warm, red, or acutely worse.  - she has difficulty climbing and descending stairs. She gets crepitus, and some feelings of instability intermittently.  - she says that she needs something for pain to last until her orthopedic appointment.   All relevant systems were reviewed and were negative unless otherwise noted in the HPI  Past Medical History Reviewed problem list.  Medications- reviewed and updated Current Outpatient Prescriptions  Medication Sig Dispense Refill  . acetaminophen (TYLENOL 8 HOUR) 650 MG CR tablet Take 1 tablet (650 mg total) by mouth every 8 (eight) hours as needed for pain. 90 tablet 1  . FLUoxetine (PROZAC) 20 MG tablet Take 1 tablet (20 mg total) by mouth daily. 30 tablet 1  . meloxicam (MOBIC) 15 MG tablet Take 1 tablet (15 mg total) by mouth daily. 30 tablet 2  . traMADol-acetaminophen (ULTRACET) 37.5-325 MG per tablet Take 1 tablet by mouth every 6 (six) hours as needed. 120 tablet 0  . triamcinolone ointment (KENALOG) 0.1 % Apply topically 2 (two) times daily. 30 g 3   No current facility-administered medications for this visit.   Chief complaint-noted No additions to family history Social history- patient is a non smoker  Objective: BP 126/61 mmHg  Pulse 70  Temp(Src) 98.3 F  (36.8 C) (Oral)  Ht 5\' 1"  (1.549 m)  Wt 265 lb 11.2 oz (120.521 kg)  BMI 50.23 kg/m2 Gen: NAD, alert, cooperative with exam HEENT: NCAT, EOMI, PERRL Neck: FROM, supple CV: RRR, good S1/S2, no murmur Resp: CTABL, no wheezes, non-labored Abd: SNTND, BS present, no guarding or organomegaly Ext: No edema, warm, normal tone, moves UE/LE spontaneously B/L knee: pt. With crepitus especially in the anterior compartments bilaterally along with medial joint line tenderness, mild effusion bilaterally, no ligamentous instability, negative lachman / mcmurray.  Neuro: Alert and oriented, No gross deficits Skin: no rashes no lesions  Assessment/Plan:  # End Stage Arthritis / Bilateral knee pain - pt. Here with known knee arthritis and bilateral knee pain needing pain medication to last until appointment with orthopedics. No red flag signs or symptoms.  - Ultracet 1-2 pills BID until appointment.  - Ice / heat and rest as needed.  - home quad strengthening exercises discussed given her anticipation of likely surgery.  - follow up with ortho - return for follow up here prn.

## 2015-08-05 ENCOUNTER — Ambulatory Visit: Payer: Medicaid Other | Admitting: Family Medicine

## 2015-08-09 ENCOUNTER — Telehealth: Payer: Self-pay | Admitting: Family Medicine

## 2015-08-09 DIAGNOSIS — M1711 Unilateral primary osteoarthritis, right knee: Secondary | ICD-10-CM

## 2015-08-09 NOTE — Telephone Encounter (Signed)
Referral placed  [name], MD, MPH PGY-2,  [facility] 08/09/2015 9:45 AM

## 2015-08-09 NOTE — Telephone Encounter (Signed)
Pt called and will need a referral to go to [facility]. jw

## 2015-08-12 ENCOUNTER — Telehealth: Payer: Self-pay | Admitting: Family Medicine

## 2015-08-12 NOTE — Telephone Encounter (Signed)
Patient calling and would like to request a referral to [facility] located here on [facility] for her knee pain. Thank you, Fonda Kinder, ASA

## 2015-08-13 NOTE — Telephone Encounter (Signed)
[facility] referral confirmation Referral #: [id number]. Confirmation page faxed to [facility] and they will contact patient to schedule her appt.

## 2015-08-23 ENCOUNTER — Ambulatory Visit (HOSPITAL_COMMUNITY)
Admission: RE | Admit: 2015-08-23 | Discharge: 2015-08-23 | Disposition: A | Discharge status: Home / Self Care | Payer: Medicaid Other | Source: Ambulatory Visit | Attending: Family Medicine | Admitting: Family Medicine

## 2015-08-23 ENCOUNTER — Ambulatory Visit (INDEPENDENT_AMBULATORY_CARE_PROVIDER_SITE_OTHER): Payer: Medicaid Other | Admitting: Family Medicine

## 2015-08-23 ENCOUNTER — Encounter: Payer: Self-pay | Admitting: Family Medicine

## 2015-08-23 VITALS — BP 110/70 | HR 86 | Temp 97.5°F | Ht 61.0 in | Wt 260.0 lb

## 2015-08-23 DIAGNOSIS — M1711 Unilateral primary osteoarthritis, right knee: Secondary | ICD-10-CM

## 2015-08-23 DIAGNOSIS — F32A Depression, unspecified: Secondary | ICD-10-CM

## 2015-08-23 DIAGNOSIS — F329 Major depressive disorder, single episode, unspecified: Secondary | ICD-10-CM

## 2015-08-23 DIAGNOSIS — Z01810 Encounter for preprocedural cardiovascular examination: Secondary | ICD-10-CM | POA: Diagnosis present

## 2015-08-23 DIAGNOSIS — L309 Dermatitis, unspecified: Secondary | ICD-10-CM

## 2015-08-23 DIAGNOSIS — Z01818 Encounter for other preprocedural examination: Secondary | ICD-10-CM | POA: Diagnosis not present

## 2015-08-23 MED ORDER — CLOBETASOL PROPIONATE 0.05 % EX OINT: 1.0000 "application " | TOPICAL_OINTMENT | Freq: Two times a day (BID) | CUTANEOUS | Status: DC

## 2015-08-23 NOTE — Progress Notes (Signed)
Subjective:   Danitra R Haegele is a 54 y.o. female with a history of obesity and OA of R knee here for pre-op cardiac eval.  Pre-op cardiac eval: Ms. Wyeth has no history of cardiovascular disease, lung disease, diabetes, or stroke. Her only cardiac risk factor is obesity. She reports dyspnea when walking up a flight of stairs in her home, but can do 4 miles on stationary bike without SOB. She does not take any anticoagulants. She is independent in all ADLs. No CP or LE edema  Obesity: Started going back to [facility], trying to take better care of herself, and eat a better diet  Eczema - wants something stronger.  Triamcinolone does not take rash completely away, no fevers  Depression - stopped taking Prozac, worries about upcoming surgery, thinks mind is taking a toll on body as well.  Review of Systems:  Per HPI.   PMH, PSH, Medications, Allergies, and FmHx reviewed and updated in EMR.  Social History: former smoker - quit in 2003  Objective:  BP 110/70 mmHg  Pulse 86  Temp(Src) 97.5 F (36.4 C) (Oral)  Ht 5\' 1"  (1.549 m)  Wt 260 lb (117.935 kg)  BMI 49.15 kg/m2  SpO2 99%  Gen:  54 y.o. female in NAD HEENT: NCAT, MMM, EOMI, PERRL, anicteric sclerae CV: RRR, no MRG, intact distal pulses Resp: Non-labored, CTAB, no wheezes noted Ext: WWP, no edema Skin: eczematous rash of neck and arms MSK: moves all 4 extremities Neuro: Alert and oriented, speech normal      Chemistry      Component Value Date/Time   NA 138 03/08/2015 1009   K 3.9 03/08/2015 1009   CL 104 03/08/2015 1009   CO2 26 03/08/2015 1009   BUN 13 03/08/2015 1009   CREATININE 0.55 03/08/2015 1009   CREATININE 0.51 02/22/2015 1646      Component Value Date/Time   CALCIUM 9.0 03/08/2015 1009   ALKPHOS 144* 03/08/2015 1009   AST 20 03/08/2015 1009   ALT 12* 03/08/2015 1009   BILITOT 0.5 03/08/2015 1009      Lab Results  Component Value Date   WBC 5.2 03/08/2015   HGB 13.1 03/08/2015   HCT 40.5  03/08/2015   MCV 84.9 03/08/2015   PLT 295 03/08/2015   Lab Results  Component Value Date   TSH 1.121 12/01/2012   Lab Results  Component Value Date   HGBA1C 5.3 02/22/2015   Assessment & Plan:     ELYANNA PELLETT is a 54 y.o. female here for   Osteoarthritis of right knee Preoperative cardiac evaluation completed today. Scheduled for right TKR in 09/23/15 with Dr. Noemi Chapel. - RCRI score of 0 with cardiac risk of 0.4%.  - ACS NSQIR cardiac risk of <1%.  - EKG normal sinus rhythm.  - Recent labs within normal limits.  - No history of lung, cardiac or cerebrovascular disease. Only risk factor for cardiovascular disease is obesity.  - Advised patient to stop taking meloxicam one day prior to surgery. She does not take any anticoagulants. - She is deemed to be at a low cardiac risk for intermediate risk surgery.  Eczema Switch triamcinolone to clobetasol ointment Advised against use on face and risk of hypopigmentation  Morbid obesity Encouraged patient to continue to work on diet and exercise  Depression Appreciate BH consult in clinic Can try Wellbutrin as it is more activating Will call patient to discuss further    [name], MD MPH PGY-2,  [facility] 08/24/2015  10:46 AM
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
## 2015-08-23 NOTE — Patient Instructions (Addendum)
Nice to see you again today. Your cleared from my standpoint for your upcoming surgery. I sent a new cream to the pharmacy for your eczema. You can apply this twice daily.  I think be helpful for you to go back and see her therapist before your surgery.  Take care, Dr. Jacinto Reap

## 2015-08-24 ENCOUNTER — Telehealth: Payer: Self-pay | Admitting: Family Medicine

## 2015-08-24 NOTE — Assessment & Plan Note (Signed)
Encouraged patient to continue to work on diet and exercise

## 2015-08-24 NOTE — Assessment & Plan Note (Signed)
Switch triamcinolone to clobetasol ointment Advised against use on face and risk of hypopigmentation

## 2015-08-24 NOTE — Telephone Encounter (Signed)
Called patient to discuss possibility of starting Wellbutrin for depression.  SSRIs have caused her to be drowsy in the past.  After talking with [facility] consult in clinic, though Wellbutrin might be a good option.  Daughter answered and stated she will have patient call back to clinic on Monday to discuss further.  [name], MD, MPH PGY-2,  [facility] 08/24/2015 10:39 AM

## 2015-08-24 NOTE — Assessment & Plan Note (Signed)
Appreciate BH consult in clinic Can try Wellbutrin as it is more activating Will call patient to discuss further

## 2015-08-24 NOTE — Assessment & Plan Note (Signed)
Preoperative cardiac evaluation completed today. Scheduled for right TKR in 09/23/15 with Dr. Noemi Chapel. - RCRI score of 0 with cardiac risk of 0.4%.  - ACS NSQIR cardiac risk of <1%.  - EKG normal sinus rhythm.  - Recent labs within normal limits.  - No history of lung, cardiac or cerebrovascular disease. Only risk factor for cardiovascular disease is obesity.  - Advised patient to stop taking meloxicam one day prior to surgery. She does not take any anticoagulants. - She is deemed to be at a low cardiac risk for intermediate risk surgery.

## 2015-08-27 NOTE — Telephone Encounter (Signed)
Attempted to call patient again to discuss Wellbutrin.  No answer on number that daughter reports is home phone.  [name], MD, MPH PGY-2,  [facility] 08/27/2015 9:56 AM

## 2015-09-11 DIAGNOSIS — M1711 Unilateral primary osteoarthritis, right knee: Secondary | ICD-10-CM | POA: Diagnosis present
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
## 2015-09-13 ENCOUNTER — Encounter (HOSPITAL_COMMUNITY)
Admission: RE | Admit: 2015-09-13 | Discharge: 2015-09-13 | Disposition: A | Discharge status: Home / Self Care | Payer: Medicaid Other | Source: Ambulatory Visit | Attending: Orthopedic Surgery | Admitting: Orthopedic Surgery

## 2015-09-13 ENCOUNTER — Encounter (HOSPITAL_COMMUNITY): Payer: Self-pay

## 2015-09-13 DIAGNOSIS — M1711 Unilateral primary osteoarthritis, right knee: Secondary | ICD-10-CM | POA: Insufficient documentation

## 2015-09-13 DIAGNOSIS — Z01812 Encounter for preprocedural laboratory examination: Secondary | ICD-10-CM | POA: Insufficient documentation

## 2015-09-13 DIAGNOSIS — Z0183 Encounter for blood typing: Secondary | ICD-10-CM | POA: Insufficient documentation

## 2015-09-13 LAB — COMPREHENSIVE METABOLIC PANEL
ALBUMIN: 3.5 g/dL (ref 3.5–5.0)
ALT: 11 U/L — AB (ref 14–54)
ANION GAP: 7 (ref 5–15)
AST: 17 U/L (ref 15–41)
Alkaline Phosphatase: 126 U/L (ref 38–126)
BUN: 12 mg/dL (ref 6–20)
CALCIUM: 8.9 mg/dL (ref 8.9–10.3)
CHLORIDE: 105 mmol/L (ref 101–111)
CO2: 26 mmol/L (ref 22–32)
Creatinine, Ser: 0.58 mg/dL (ref 0.44–1.00)
GFR calc non Af Amer: >60 mL/min (ref >60)
GLUCOSE: 79 mg/dL (ref 65–99)
Potassium: 4.1 mmol/L (ref 3.5–5.1)
SODIUM: 138 mmol/L (ref 135–145)
TOTAL PROTEIN: 7.8 g/dL (ref 6.5–8.1)
Total Bilirubin: 0.6 mg/dL (ref 0.3–1.2)

## 2015-09-13 LAB — CBC WITH DIFFERENTIAL/PLATELET
BASOS PCT: 0 %
Basophils Absolute: 0 10*3/uL (ref 0.0–0.1)
EOS ABS: 0.1 10*3/uL (ref 0.0–0.7)
EOS PCT: 2 %
HCT: 40.4 % (ref 36.0–46.0)
Hemoglobin: 12.9 g/dL (ref 12.0–15.0)
LYMPHS ABS: 2.1 10*3/uL (ref 0.7–4.0)
Lymphocytes Relative: 34 %
MCH: 27.2 pg (ref 26.0–34.0)
MCHC: 31.9 g/dL (ref 30.0–36.0)
MCV: 85.2 fL (ref 78.0–100.0)
Monocytes Absolute: 0.6 10*3/uL (ref 0.1–1.0)
Monocytes Relative: 10 %
NEUTROS PCT: 54 %
Neutro Abs: 3.4 10*3/uL (ref 1.7–7.7)
PLATELETS: 311 10*3/uL (ref 150–400)
RBC: 4.74 MIL/uL (ref 3.87–5.11)
RDW: 15.2 % (ref 11.5–15.5)
WBC: 6.2 10*3/uL (ref 4.0–10.5)

## 2015-09-13 LAB — SURGICAL PCR SCREEN
MRSA, PCR: NEGATIVE
Staphylococcus aureus: NEGATIVE

## 2015-09-13 LAB — APTT: aPTT: 30 seconds (ref 24–37)

## 2015-09-13 LAB — TYPE AND SCREEN
ABO/RH(D): B POS
ANTIBODY SCREEN: NEGATIVE

## 2015-09-13 LAB — PROTIME-INR
INR: 1.07 (ref 0.00–1.49)
Prothrombin Time: 14.1 seconds (ref 11.6–15.2)

## 2015-09-13 NOTE — Pre-Procedure Instructions (Signed)
    Zahra R Greeno  09/13/2015      [id number], [place] [address] [place] [address] Phone: [phone] Fax: [phone]    Your procedure is scheduled on Monday, Dec. 12  Report to [facility] Admitting at 7:45 A.M.  Call this number if you have problems the morning of surgery:  [phone]              Any questions prior to surgery call [phone] Monday- Friday 8am-4pm   Remember:  Do not eat food or drink liquids after midnight Sunday, Dec. 11   Take these medicines the morning of surgery with A SIP OF WATER: tylenol if needed or tramadol if needed              Per Dr. Noemi Chapel stop mobic now .   Do not wear jewelry, make-up or nail polish.  Do not wear lotions, powders, or perfumes.  You may not wear deodorant.  Do not shave 48 hours prior to surgery.  Men may shave face and neck.  Do not bring valuables to the hospital.  [facility] is not responsible for any belongings or valuables.  Contacts, dentures or bridgework may not be worn into surgery.  Leave your suitcase in the car.  After surgery it may be brought to your room.  For patients admitted to the hospital, discharge time will be determined by your treatment team.  Patients discharged the day of surgery will not be allowed to drive home.    Special instructions:  Review preparing for surgery  Please read over the following fact sheets that you were given. Pain Booklet, Coughing and Deep Breathing, Blood Transfusion Information, Total Joint Packet, MRSA Information and Surgical Site Infection Prevention

## 2015-09-13 NOTE — Progress Notes (Signed)
PCP: [facility], Dr. Brita Romp

## 2015-09-14 LAB — URINE CULTURE

## 2015-09-22 MED ORDER — CEFAZOLIN SODIUM 10 G IJ SOLR
3.0000 g | INTRAMUSCULAR | Status: AC
  Administered 2015-09-23: 3 g via INTRAVENOUS
  Filled 2015-09-22: qty 3000

## 2015-09-23 ENCOUNTER — Encounter (HOSPITAL_COMMUNITY)
Admission: RE | Disposition: A | Discharge status: Home / Self Care | Payer: Self-pay | Source: Ambulatory Visit | Attending: Orthopedic Surgery

## 2015-09-23 ENCOUNTER — Inpatient Hospital Stay (HOSPITAL_COMMUNITY)
Admission: RE | Admit: 2015-09-23 | Discharge: 2015-09-27 | DRG: 470 | Disposition: A | Discharge status: Home / Self Care | Payer: Medicaid Other | Source: Ambulatory Visit | Attending: Orthopedic Surgery | Admitting: Orthopedic Surgery

## 2015-09-23 ENCOUNTER — Inpatient Hospital Stay (HOSPITAL_COMMUNITY): Payer: Medicaid Other | Admitting: Anesthesiology

## 2015-09-23 ENCOUNTER — Encounter (HOSPITAL_COMMUNITY): Payer: Self-pay | Admitting: Surgery

## 2015-09-23 DIAGNOSIS — Z818 Family history of other mental and behavioral disorders: Secondary | ICD-10-CM | POA: Diagnosis not present

## 2015-09-23 DIAGNOSIS — M170 Bilateral primary osteoarthritis of knee: Secondary | ICD-10-CM | POA: Diagnosis present

## 2015-09-23 DIAGNOSIS — K219 Gastro-esophageal reflux disease without esophagitis: Secondary | ICD-10-CM | POA: Diagnosis present

## 2015-09-23 DIAGNOSIS — E785 Hyperlipidemia, unspecified: Secondary | ICD-10-CM | POA: Diagnosis present

## 2015-09-23 DIAGNOSIS — F411 Generalized anxiety disorder: Secondary | ICD-10-CM | POA: Diagnosis present

## 2015-09-23 DIAGNOSIS — D649 Anemia, unspecified: Secondary | ICD-10-CM | POA: Diagnosis present

## 2015-09-23 DIAGNOSIS — M545 Low back pain, unspecified: Secondary | ICD-10-CM | POA: Diagnosis present

## 2015-09-23 DIAGNOSIS — Z79899 Other long term (current) drug therapy: Secondary | ICD-10-CM

## 2015-09-23 DIAGNOSIS — M1711 Unilateral primary osteoarthritis, right knee: Principal | ICD-10-CM | POA: Diagnosis present

## 2015-09-23 DIAGNOSIS — M25561 Pain in right knee: Secondary | ICD-10-CM | POA: Diagnosis present

## 2015-09-23 DIAGNOSIS — F329 Major depressive disorder, single episode, unspecified: Secondary | ICD-10-CM | POA: Diagnosis present

## 2015-09-23 DIAGNOSIS — Z87891 Personal history of nicotine dependence: Secondary | ICD-10-CM | POA: Diagnosis not present

## 2015-09-23 DIAGNOSIS — F32A Depression, unspecified: Secondary | ICD-10-CM | POA: Diagnosis present

## 2015-09-23 DIAGNOSIS — F419 Anxiety disorder, unspecified: Secondary | ICD-10-CM | POA: Diagnosis present

## 2015-09-23 DIAGNOSIS — G8929 Other chronic pain: Secondary | ICD-10-CM | POA: Diagnosis present

## 2015-09-23 DIAGNOSIS — Z886 Allergy status to analgesic agent status: Secondary | ICD-10-CM | POA: Diagnosis not present

## 2015-09-23 DIAGNOSIS — Z6841 Body Mass Index (BMI) 40.0 and over, adult: Secondary | ICD-10-CM

## 2015-09-23 DIAGNOSIS — E7800 Pure hypercholesterolemia, unspecified: Secondary | ICD-10-CM | POA: Diagnosis present

## 2015-09-23 HISTORY — DX: Bilateral primary osteoarthritis of knee: M17.0

## 2015-09-23 HISTORY — PX: TOTAL KNEE ARTHROPLASTY: SHX125

## 2015-09-23 SURGERY — ARTHROPLASTY, KNEE, TOTAL
Anesthesia: General | Site: Knee | Laterality: Right

## 2015-09-23 MED ORDER — HYDROMORPHONE HCL 1 MG/ML IJ SOLN
INTRAMUSCULAR | Status: AC
  Filled 2015-09-23: qty 1

## 2015-09-23 MED ORDER — BUPIVACAINE-EPINEPHRINE 0.25% -1:200000 IJ SOLN
INTRAMUSCULAR | Status: DC | PRN
  Administered 2015-09-23: 30 mL

## 2015-09-23 MED ORDER — DEXAMETHASONE SODIUM PHOSPHATE 10 MG/ML IJ SOLN
INTRAMUSCULAR | Status: DC | PRN
  Administered 2015-09-23: 10 mg via INTRAVENOUS

## 2015-09-23 MED ORDER — POLYETHYLENE GLYCOL 3350 17 G PO PACK
17.0000 g | PACK | Freq: Two times a day (BID) | ORAL | Status: DC
  Administered 2015-09-24 – 2015-09-27 (×7): 17 g via ORAL
  Filled 2015-09-23 (×7): qty 1

## 2015-09-23 MED ORDER — DEXAMETHASONE SODIUM PHOSPHATE 10 MG/ML IJ SOLN
INTRAMUSCULAR | Status: AC
  Filled 2015-09-23: qty 1

## 2015-09-23 MED ORDER — ONDANSETRON HCL 4 MG PO TABS: 4.0000 mg | ORAL_TABLET | Freq: Four times a day (QID) | ORAL | Status: DC | PRN

## 2015-09-23 MED ORDER — FENTANYL CITRATE (PF) 100 MCG/2ML IJ SOLN
INTRAMUSCULAR | Status: AC
  Administered 2015-09-23: 50 ug
  Filled 2015-09-23: qty 2

## 2015-09-23 MED ORDER — POVIDONE-IODINE 7.5 % EX SOLN
Freq: Once | CUTANEOUS | Status: DC
  Filled 2015-09-23: qty 118

## 2015-09-23 MED ORDER — GLYCOPYRROLATE 0.2 MG/ML IJ SOLN
INTRAMUSCULAR | Status: DC | PRN
  Administered 2015-09-23: 0.6 mg via INTRAVENOUS

## 2015-09-23 MED ORDER — METOCLOPRAMIDE HCL 5 MG PO TABS: 5.0000 mg | ORAL_TABLET | Freq: Three times a day (TID) | ORAL | Status: DC | PRN

## 2015-09-23 MED ORDER — LIDOCAINE HCL (CARDIAC) 20 MG/ML IV SOLN
INTRAVENOUS | Status: DC | PRN
  Administered 2015-09-23: 80 mg via INTRAVENOUS

## 2015-09-23 MED ORDER — MENTHOL 3 MG MT LOZG: 1.0000 | LOZENGE | OROMUCOSAL | Status: DC | PRN

## 2015-09-23 MED ORDER — NEOSTIGMINE METHYLSULFATE 10 MG/10ML IV SOLN
INTRAVENOUS | Status: DC | PRN
  Administered 2015-09-23: 4 mg via INTRAVENOUS

## 2015-09-23 MED ORDER — CLOBETASOL PROPIONATE 0.05 % EX OINT
1.0000 "application " | TOPICAL_OINTMENT | Freq: Two times a day (BID) | CUTANEOUS | Status: DC | PRN
  Filled 2015-09-23: qty 15

## 2015-09-23 MED ORDER — PHENYLEPHRINE 40 MCG/ML (10ML) SYRINGE FOR IV PUSH (FOR BLOOD PRESSURE SUPPORT)
PREFILLED_SYRINGE | INTRAVENOUS | Status: AC
  Filled 2015-09-23: qty 10

## 2015-09-23 MED ORDER — HYDROMORPHONE HCL 1 MG/ML IJ SOLN
0.2500 mg | INTRAMUSCULAR | Status: DC | PRN
  Administered 2015-09-23 (×4): 0.5 mg via INTRAVENOUS

## 2015-09-23 MED ORDER — LACTATED RINGERS IV SOLN
INTRAVENOUS | Status: DC | PRN
  Administered 2015-09-23 (×2): via INTRAVENOUS

## 2015-09-23 MED ORDER — ONDANSETRON HCL 4 MG/2ML IJ SOLN: 4.0000 mg | Freq: Four times a day (QID) | INTRAMUSCULAR | Status: DC | PRN

## 2015-09-23 MED ORDER — POTASSIUM CHLORIDE IN NACL 20-0.9 MEQ/L-% IV SOLN
INTRAVENOUS | Status: DC
  Administered 2015-09-23 – 2015-09-24 (×2): via INTRAVENOUS
  Filled 2015-09-23 (×2): qty 1000

## 2015-09-23 MED ORDER — WHITE PETROLATUM GEL
Status: AC
  Filled 2015-09-23: qty 1

## 2015-09-23 MED ORDER — DEXAMETHASONE SODIUM PHOSPHATE 10 MG/ML IJ SOLN
10.0000 mg | Freq: Three times a day (TID) | INTRAMUSCULAR | Status: AC
  Administered 2015-09-23 – 2015-09-24 (×3): 10 mg via INTRAVENOUS
  Filled 2015-09-23 (×3): qty 1

## 2015-09-23 MED ORDER — ONDANSETRON HCL 4 MG/2ML IJ SOLN
INTRAMUSCULAR | Status: DC | PRN
  Administered 2015-09-23: 4 mg via INTRAVENOUS

## 2015-09-23 MED ORDER — ROCURONIUM BROMIDE 50 MG/5ML IV SOLN
INTRAVENOUS | Status: AC
  Filled 2015-09-23: qty 2

## 2015-09-23 MED ORDER — HYDROMORPHONE HCL 1 MG/ML IJ SOLN
1.0000 mg | INTRAMUSCULAR | Status: DC | PRN
  Administered 2015-09-23 – 2015-09-26 (×11): 1 mg via INTRAVENOUS
  Filled 2015-09-23 (×11): qty 1

## 2015-09-23 MED ORDER — ROCURONIUM BROMIDE 100 MG/10ML IV SOLN
INTRAVENOUS | Status: DC | PRN
  Administered 2015-09-23: 50 mg via INTRAVENOUS
  Administered 2015-09-23: 10 mg via INTRAVENOUS

## 2015-09-23 MED ORDER — FENTANYL CITRATE (PF) 100 MCG/2ML IJ SOLN
INTRAMUSCULAR | Status: DC | PRN
  Administered 2015-09-23: 150 ug via INTRAVENOUS
  Administered 2015-09-23: 100 ug via INTRAVENOUS
  Administered 2015-09-23: 50 ug via INTRAVENOUS

## 2015-09-23 MED ORDER — OXYCODONE HCL 5 MG PO TABS
ORAL_TABLET | ORAL | Status: AC
  Filled 2015-09-23: qty 1

## 2015-09-23 MED ORDER — NEOSTIGMINE METHYLSULFATE 10 MG/10ML IV SOLN
INTRAVENOUS | Status: AC
  Filled 2015-09-23: qty 1

## 2015-09-23 MED ORDER — GLYCOPYRROLATE 0.2 MG/ML IJ SOLN
INTRAMUSCULAR | Status: AC
  Filled 2015-09-23: qty 3

## 2015-09-23 MED ORDER — MIDAZOLAM HCL 2 MG/2ML IJ SOLN
INTRAMUSCULAR | Status: AC
  Administered 2015-09-23: 1 mg
  Filled 2015-09-23: qty 2

## 2015-09-23 MED ORDER — ACETAMINOPHEN 325 MG PO TABS
650.0000 mg | ORAL_TABLET | Freq: Four times a day (QID) | ORAL | Status: DC | PRN
Start: 2015-09-23 — End: 2015-09-27
  Administered 2015-09-25 – 2015-09-27 (×3): 650 mg via ORAL
  Filled 2015-09-23 (×3): qty 2

## 2015-09-23 MED ORDER — FENTANYL CITRATE (PF) 250 MCG/5ML IJ SOLN
INTRAMUSCULAR | Status: AC
  Filled 2015-09-23: qty 5

## 2015-09-23 MED ORDER — CEFAZOLIN SODIUM-DEXTROSE 2-3 GM-% IV SOLR
2.0000 g | Freq: Four times a day (QID) | INTRAVENOUS | Status: AC
  Administered 2015-09-23 (×2): 2 g via INTRAVENOUS
  Filled 2015-09-23 (×2): qty 50

## 2015-09-23 MED ORDER — METOCLOPRAMIDE HCL 5 MG/ML IJ SOLN: 5.0000 mg | Freq: Three times a day (TID) | INTRAMUSCULAR | Status: DC | PRN

## 2015-09-23 MED ORDER — ONDANSETRON HCL 4 MG/2ML IJ SOLN
INTRAMUSCULAR | Status: AC
  Filled 2015-09-23: qty 2

## 2015-09-23 MED ORDER — DIPHENHYDRAMINE HCL 12.5 MG/5ML PO ELIX: 12.5000 mg | ORAL_SOLUTION | ORAL | Status: DC | PRN

## 2015-09-23 MED ORDER — EPHEDRINE SULFATE 50 MG/ML IJ SOLN
INTRAMUSCULAR | Status: AC
  Filled 2015-09-23: qty 1

## 2015-09-23 MED ORDER — LACTATED RINGERS IV SOLN
INTRAVENOUS | Status: DC
  Administered 2015-09-23: 09:00:00 via INTRAVENOUS

## 2015-09-23 MED ORDER — CHLORHEXIDINE GLUCONATE 4 % EX LIQD: 60.0000 mL | Freq: Once | CUTANEOUS | Status: DC

## 2015-09-23 MED ORDER — ALUM & MAG HYDROXIDE-SIMETH 200-200-20 MG/5ML PO SUSP: 30.0000 mL | ORAL | Status: DC | PRN

## 2015-09-23 MED ORDER — ACETAMINOPHEN 10 MG/ML IV SOLN
INTRAVENOUS | Status: AC
  Administered 2015-09-23: 1000 mg via INTRAVENOUS
  Filled 2015-09-23: qty 100

## 2015-09-23 MED ORDER — LIDOCAINE HCL (CARDIAC) 20 MG/ML IV SOLN
INTRAVENOUS | Status: AC
  Filled 2015-09-23: qty 5

## 2015-09-23 MED ORDER — OXYCODONE HCL 5 MG PO TABS
5.0000 mg | ORAL_TABLET | ORAL | Status: DC | PRN
  Administered 2015-09-23: 10 mg via ORAL
  Administered 2015-09-23: 5 mg via ORAL
  Administered 2015-09-24 – 2015-09-25 (×8): 10 mg via ORAL
  Filled 2015-09-23 (×9): qty 2

## 2015-09-23 MED ORDER — HYDROMORPHONE HCL 1 MG/ML IJ SOLN: 0.2500 mg | INTRAMUSCULAR | Status: DC | PRN

## 2015-09-23 MED ORDER — STERILE WATER FOR INJECTION IJ SOLN
INTRAMUSCULAR | Status: AC
  Filled 2015-09-23: qty 10

## 2015-09-23 MED ORDER — SODIUM CHLORIDE 0.9 % IR SOLN
Status: DC | PRN
  Administered 2015-09-23: 4000 mL
  Administered 2015-09-23: 1000 mL

## 2015-09-23 MED ORDER — PROPOFOL 10 MG/ML IV BOLUS
INTRAVENOUS | Status: DC | PRN
  Administered 2015-09-23: 300 mg via INTRAVENOUS

## 2015-09-23 MED ORDER — ACETAMINOPHEN 10 MG/ML IV SOLN
1000.0000 mg | Freq: Once | INTRAVENOUS | Status: AC
  Administered 2015-09-23: 1000 mg via INTRAVENOUS

## 2015-09-23 MED ORDER — APIXABAN 2.5 MG PO TABS
2.5000 mg | ORAL_TABLET | Freq: Two times a day (BID) | ORAL | Status: DC
  Administered 2015-09-24 – 2015-09-27 (×7): 2.5 mg via ORAL
  Filled 2015-09-23 (×7): qty 1

## 2015-09-23 MED ORDER — ACETAMINOPHEN 650 MG RE SUPP: 650.0000 mg | Freq: Four times a day (QID) | RECTAL | Status: DC | PRN

## 2015-09-23 MED ORDER — ROPIVACAINE HCL 5 MG/ML IJ SOLN
INTRAMUSCULAR | Status: DC | PRN
  Administered 2015-09-23: 30 mL via PERINEURAL

## 2015-09-23 MED ORDER — BUPIVACAINE-EPINEPHRINE (PF) 0.25% -1:200000 IJ SOLN
INTRAMUSCULAR | Status: AC
  Filled 2015-09-23: qty 30

## 2015-09-23 MED ORDER — PROMETHAZINE HCL 25 MG/ML IJ SOLN
6.2500 mg | INTRAMUSCULAR | Status: DC | PRN
Start: 2015-09-23 — End: 2015-09-23

## 2015-09-23 MED ORDER — FENTANYL CITRATE (PF) 250 MCG/5ML IJ SOLN
INTRAMUSCULAR | Status: AC
Start: 2015-09-23 — End: 2015-09-23
  Filled 2015-09-23: qty 5

## 2015-09-23 MED ORDER — MIDAZOLAM HCL 2 MG/2ML IJ SOLN
INTRAMUSCULAR | Status: AC
  Filled 2015-09-23: qty 2

## 2015-09-23 MED ORDER — PHENOL 1.4 % MT LIQD: 1.0000 | OROMUCOSAL | Status: DC | PRN

## 2015-09-23 MED ORDER — PHENYLEPHRINE HCL 10 MG/ML IJ SOLN
INTRAMUSCULAR | Status: DC | PRN
  Administered 2015-09-23 (×4): 40 ug via INTRAVENOUS

## 2015-09-23 MED ORDER — DOCUSATE SODIUM 100 MG PO CAPS
100.0000 mg | ORAL_CAPSULE | Freq: Two times a day (BID) | ORAL | Status: DC
  Administered 2015-09-23 – 2015-09-27 (×8): 100 mg via ORAL
  Filled 2015-09-23 (×8): qty 1

## 2015-09-23 SURGICAL SUPPLY — 78 items
APL SKNCLS STERI-STRIP NONHPOA (GAUZE/BANDAGES/DRESSINGS) ×1
BANDAGE ESMARK 6X9 LF (GAUZE/BANDAGES/DRESSINGS) ×1 IMPLANT
BENZOIN TINCTURE PRP APPL 2/3 (GAUZE/BANDAGES/DRESSINGS) ×3 IMPLANT
BLADE SAGITTAL 25.0X1.19X90 (BLADE) ×2 IMPLANT
BLADE SAGITTAL 25.0X1.19X90MM (BLADE) ×1
BLADE SAW RECIP 87.9 MT (BLADE) IMPLANT
BLADE SAW SAG 90X13X1.27 (BLADE) ×3 IMPLANT
BLADE SURG 10 STRL SS (BLADE) ×6 IMPLANT
BNDG CMPR 9X6 STRL LF SNTH (GAUZE/BANDAGES/DRESSINGS) ×1
BNDG CMPR MED 15X6 ELC VLCR LF (GAUZE/BANDAGES/DRESSINGS) ×1
BNDG ELASTIC 6X15 VLCR STRL LF (GAUZE/BANDAGES/DRESSINGS) ×3 IMPLANT
BNDG ESMARK 6X9 LF (GAUZE/BANDAGES/DRESSINGS) ×3
BOWL SMART MIX CTS (DISPOSABLE) ×3 IMPLANT
CAP KNEE TOTAL 3 SIGMA ×2 IMPLANT
CEMENT HV SMART SET (Cement) ×6 IMPLANT
CLOSURE STERI-STRIP 1/2X4 (GAUZE/BANDAGES/DRESSINGS) ×1
CLOSURE WOUND 1/2 X4 (GAUZE/BANDAGES/DRESSINGS) ×1
CLSR STERI-STRIP ANTIMIC 1/2X4 (GAUZE/BANDAGES/DRESSINGS) ×1 IMPLANT
COVER SURGICAL LIGHT HANDLE (MISCELLANEOUS) ×3 IMPLANT
CUFF TOURNIQUET SINGLE 34IN LL (TOURNIQUET CUFF) ×3 IMPLANT
CUFF TOURNIQUET SINGLE 44IN (TOURNIQUET CUFF) IMPLANT
DECANTER SPIKE VIAL GLASS SM (MISCELLANEOUS) ×3 IMPLANT
DRAPE EXTREMITY T 121X128X90 (DRAPE) ×3 IMPLANT
DRAPE INCISE IOBAN 66X45 STRL (DRAPES) ×3 IMPLANT
DRAPE PROXIMA HALF (DRAPES) ×3 IMPLANT
DRAPE U-SHAPE 47X51 STRL (DRAPES) ×3 IMPLANT
DRSG AQUACEL AG ADV 3.5X14 (GAUZE/BANDAGES/DRESSINGS) ×3 IMPLANT
DRSG PAD ABDOMINAL 8X10 ST (GAUZE/BANDAGES/DRESSINGS) ×6 IMPLANT
DURAPREP 26ML APPLICATOR (WOUND CARE) ×6 IMPLANT
ELECT CAUTERY BLADE 6.4 (BLADE) ×3 IMPLANT
ELECT REM PT RETURN 9FT ADLT (ELECTROSURGICAL) ×3
ELECTRODE REM PT RTRN 9FT ADLT (ELECTROSURGICAL) ×1 IMPLANT
EVACUATOR 1/8 PVC DRAIN (DRAIN) IMPLANT
FACESHIELD WRAPAROUND (MASK) ×3 IMPLANT
FACESHIELD WRAPAROUND OR TEAM (MASK) ×1 IMPLANT
GAUZE SPONGE 4X4 12PLY STRL (GAUZE/BANDAGES/DRESSINGS) ×3 IMPLANT
GLOVE BIO SURGEON STRL SZ7 (GLOVE) ×3 IMPLANT
GLOVE BIOGEL PI IND STRL 7.0 (GLOVE) ×1 IMPLANT
GLOVE BIOGEL PI IND STRL 7.5 (GLOVE) ×1 IMPLANT
GLOVE BIOGEL PI INDICATOR 7.0 (GLOVE) ×2
GLOVE BIOGEL PI INDICATOR 7.5 (GLOVE) ×2
GLOVE SS BIOGEL STRL SZ 7.5 (GLOVE) ×1 IMPLANT
GLOVE SUPERSENSE BIOGEL SZ 7.5 (GLOVE) ×2
GOWN STRL REUS W/ TWL LRG LVL3 (GOWN DISPOSABLE) ×1 IMPLANT
GOWN STRL REUS W/ TWL XL LVL3 (GOWN DISPOSABLE) ×2 IMPLANT
GOWN STRL REUS W/TWL LRG LVL3 (GOWN DISPOSABLE) ×3
GOWN STRL REUS W/TWL XL LVL3 (GOWN DISPOSABLE) ×6
HANDPIECE INTERPULSE COAX TIP (DISPOSABLE) ×3
HOOD PEEL AWAY FACE SHEILD DIS (HOOD) ×6 IMPLANT
IMMOBILIZER KNEE 22 UNIV (SOFTGOODS) ×3 IMPLANT
KIT BASIN OR (CUSTOM PROCEDURE TRAY) ×3 IMPLANT
KIT ROOM TURNOVER OR (KITS) ×3 IMPLANT
MANIFOLD NEPTUNE II (INSTRUMENTS) ×3 IMPLANT
MARKER SKIN DUAL TIP RULER LAB (MISCELLANEOUS) ×3 IMPLANT
NS IRRIG 1000ML POUR BTL (IV SOLUTION) ×3 IMPLANT
PACK TOTAL JOINT (CUSTOM PROCEDURE TRAY) ×3 IMPLANT
PACK UNIVERSAL I (CUSTOM PROCEDURE TRAY) ×3 IMPLANT
PAD ARMBOARD 7.5X6 YLW CONV (MISCELLANEOUS) ×6 IMPLANT
PADDING CAST COTTON 6X4 STRL (CAST SUPPLIES) ×3 IMPLANT
RUBBERBAND STERILE (MISCELLANEOUS) ×3 IMPLANT
SET HNDPC FAN SPRY TIP SCT (DISPOSABLE) ×1 IMPLANT
STRIP CLOSURE SKIN 1/2X4 (GAUZE/BANDAGES/DRESSINGS) ×2 IMPLANT
SUCTION FRAZIER TIP 10 FR DISP (SUCTIONS) ×3 IMPLANT
SUT MNCRL AB 3-0 PS2 18 (SUTURE) ×3 IMPLANT
SUT VIC AB 0 CT1 27 (SUTURE) ×9
SUT VIC AB 0 CT1 27XBRD ANBCTR (SUTURE) ×2 IMPLANT
SUT VIC AB 1 CT1 27 (SUTURE) ×3
SUT VIC AB 1 CT1 27XBRD ANBCTR (SUTURE) ×1 IMPLANT
SUT VIC AB 2-0 CT1 27 (SUTURE) ×6
SUT VIC AB 2-0 CT1 TAPERPNT 27 (SUTURE) ×2 IMPLANT
SYR 30ML SLIP (SYRINGE) ×3 IMPLANT
TOWEL OR 17X24 6PK STRL BLUE (TOWEL DISPOSABLE) ×3 IMPLANT
TOWEL OR 17X26 10 PK STRL BLUE (TOWEL DISPOSABLE) ×3 IMPLANT
TRAY FOLEY CATH 16FR SILVER (SET/KITS/TRAYS/PACK) ×3 IMPLANT
TUBE CONNECTING 12'X1/4 (SUCTIONS) ×1
TUBE CONNECTING 12X1/4 (SUCTIONS) ×2 IMPLANT
UPCHARGE REV TRAY MBT KNEE ×2 IMPLANT
YANKAUER SUCT BULB TIP NO VENT (SUCTIONS) ×3 IMPLANT

## 2015-09-23 NOTE — Interval H&P Note (Signed)
History and Physical Interval Note:  09/23/2015 8:57 AM  April [name]  has presented today for surgery, with the diagnosis of PRIMARY LOCALIZED OA  RIGHT KNEE  The various methods of treatment have been discussed with the patient and family. After consideration of risks, benefits and other options for treatment, the patient has consented to  Procedure(s): TOTAL KNEE ARTHROPLASTY (Right) as a surgical intervention .  The patient's history has been reviewed, patient examined, no change in status, stable for surgery.  I have reviewed the patient's chart and labs.  Questions were answered to the patient's satisfaction.     Elsie Saas A

## 2015-09-23 NOTE — Op Note (Signed)
MRN:     [id number] DOB/AGE:    1961/04/16 / 54 y.o.       OPERATIVE REPORT    DATE OF PROCEDURE:  09/23/2015       PREOPERATIVE DIAGNOSIS:   PRIMARY LOCALIZED OA RIGHT KNEE      Estimated body mass index is 49.15 kg/(m^2) as calculated from the following:   Height as of this encounter: 5\' 1"  (1.549 m).   Weight as of this encounter: 117.935 kg (260 lb).                                                        POSTOPERATIVE DIAGNOSIS:   SAME                                                                      PROCEDURE:  Procedure(s): TOTAL KNEE ARTHROPLASTY Using Depuy Sigma RP implants #3 Femur, #3 Revision Tibia, 61mm sigma RP bearing, 32 Patella     SURGEON: Ranessa Kosta A    ASSISTANT:  Kirstin Shepperson PA-C   (Present and scrubbed throughout the case, critical for assistance with exposure, retraction, instrumentation, and closure.)         ANESTHESIA: GET with Femoral Nerve Block  DRAINS: foley, 2 medium hemovac in knee   TOURNIQUET TIME: [id number]   COMPLICATIONS:  None     SPECIMENS: None   INDICATIONS FOR PROCEDURE: The patient has  DJD RIGHT KNEE, varus deformities, XR shows bone on bone arthritis. Patient has failed all conservative measures including anti-inflammatory medicines, narcotics, attempts at  exercise and weight loss, cortisone injections and viscosupplementation.  Risks and benefits of surgery have been discussed, questions answered.   DESCRIPTION OF PROCEDURE: The patient identified by armband, received  right femoral nerve block and IV antibiotics, in the holding area at [facility]. Patient taken to the operating room, appropriate anesthetic  monitors were attached General endotracheal anesthesia induced with  the patient in supine position, Foley catheter was inserted. Tourniquet  applied high to the operative thigh. Lateral post and foot positioner  applied to the table, the lower extremity was then prepped and draped  in usual sterile fashion  from the ankle to the tourniquet. Time-out procedure was performed. The limb was wrapped with an Esmarch bandage and the tourniquet inflated to 365 mmHg. We began the operation by making the anterior midline incision starting at handbreadth above the patella going over the patella 1 cm medial to and  4 cm distal to the tibial tubercle. Small bleeders in the skin and the  subcutaneous tissue identified and cauterized. Transverse retinaculum was incised and reflected medially and a medial parapatellar arthrotomy was accomplished. the patella was everted and theprepatellar fat pad resected. The superficial medial collateral  ligament was then elevated from anterior to posterior along the proximal  flare of the tibia and anterior half of the menisci resected. The knee was hyperflexed exposing bone on bone arthritis. Peripheral and notch osteophytes as well as the cruciate ligaments were then resected. We continued to  work our way around posteriorly along the proximal tibia,  and externally  rotated the tibia subluxing it out from underneath the femur. A McHale  retractor was placed through the notch and a lateral Hohmann retractor  placed, and we then drilled through the proximal tibia in line with the  axis of the tibia followed by an intramedullary guide rod and 2-degree  posterior slope cutting guide. The tibial cutting guide was pinned into place  allowing resection of 6 mm of bone medially and about 4 mm of bone  laterally because of her valgus deformity. Satisfied with the tibial resection, we then  entered the distal femur 2 mm anterior to the PCL origin with the  intramedullary guide rod and applied the distal femoral cutting guide  set at 6mm, with 5 degrees of valgus. This was pinned along the  epicondylar axis. At this point, the distal femoral cut was accomplished without difficulty. We then sized for a #3 femoral component and pinned the guide in 3 degrees of external rotation.The chamfer  cutting guide was pinned into place. The anterior, posterior, and chamfer cuts were accomplished without difficulty followed by  the Sigma RP box cutting guide and the box cut. We also removed posterior osteophytes from the posterior femoral condyles. At this  time, the knee was brought into full extension. We checked our  extension and flexion gaps and found them symmetric at 59mm.  The patella thickness measured at 25 mm. We set the cutting guide at 15 and removed the posterior 9.5-10 mm  of the patella sized for 32 button and drilled the lollipop. The knee  was then once again hyperflexed exposing the proximal tibia. We sized for a #3 revision tibial base plate, applied the smokestack and the conical reamer followed by the the Delta fin keel punch. We then hammered into place the Sigma RP trial femoral component, inserted a 15-mm trial bearing, trial patellar button, and took the knee through range of motion from 0-130 degrees. No thumb pressure was required for patellar  tracking. At this point, all trial components were removed, a double batch of DePuy HV cement  was mixed and applied to all bony metallic mating surfaces except for the posterior condyles of the femur itself. In order, we  hammered into place the tibial tray and removed excess cement, the femoral component and removed excess cement, a 15-mm Sigma RP bearing  was inserted, and the knee brought to full extension with compression.  The patellar button was clamped into place, and excess cement  removed. While the cement cured the wound was irrigated out with normal saline solution pulse lavage,.. Ligament stability and patellar tracking were checked and found to be excellent. The tourniquet was then released and hemostasis was obtained with cautery. The parapatellar arthrotomy was closed with  #1 ethibond suture. The subcutaneous tissue with 0 and 2-0 undyed  Vicryl suture, and 4-0 Monocryl.. A dressing of Xeroform,  4 x 4, dressing  sponges, Webril, and Ace wrap applied. Needle and sponge count were correct times 2.The patient awakened, extubated, and taken to recovery room without difficulty. Vascular status was normal, pulses 2+ and symmetric.   Bailley Guilford A 09/23/2015, 11:41 AM

## 2015-09-23 NOTE — Anesthesia Preprocedure Evaluation (Signed)
Anesthesia Evaluation  Patient identified by MRN, date of birth, ID band Patient awake    Reviewed: Allergy & Precautions, NPO status , Patient's Chart, lab work & pertinent test results  Airway Mallampati: II  TM Distance: >3 FB Neck ROM: Full    Dental no notable dental hx.    Pulmonary neg pulmonary ROS, former smoker,    Pulmonary exam normal breath sounds clear to auscultation       Cardiovascular negative cardio ROS Normal cardiovascular exam Rhythm:Regular Rate:Normal     Neuro/Psych Anxiety negative neurological ROS     GI/Hepatic negative GI ROS, Neg liver ROS,   Endo/Other  Morbid obesity  Renal/GU negative Renal ROS  negative genitourinary   Musculoskeletal negative musculoskeletal ROS (+)   Abdominal   Peds negative pediatric ROS (+)  Hematology negative hematology ROS (+)   Anesthesia Other Findings   Reproductive/Obstetrics negative OB ROS                             Anesthesia Physical Anesthesia Plan  ASA: III  Anesthesia Plan: General   Post-op Pain Management: GA combined w/ Regional for post-op pain   Induction: Intravenous  Airway Management Planned: Oral ETT  Additional Equipment:   Intra-op Plan:   Post-operative Plan: Extubation in OR  Informed Consent: I have reviewed the patients History and Physical, chart, labs and discussed the procedure including the risks, benefits and alternatives for the proposed anesthesia with the patient or authorized representative who has indicated his/her understanding and acceptance.   Dental advisory given  Plan Discussed with: CRNA and Surgeon  Anesthesia Plan Comments:         Anesthesia Quick Evaluation

## 2015-09-23 NOTE — Progress Notes (Signed)
Orthopedic Tech Progress Note Patient Details:  April [name] June 11, 1961 [id number]  Ortho Devices Type of Ortho Device: Knee Immobilizer   [name] 09/23/2015, 3:26 PM

## 2015-09-23 NOTE — Progress Notes (Signed)
Orthopedic Tech Progress Note Patient Details:  April [name] 06/08/1961 [id number]  CPM Right Knee CPM Right Knee: On Right Knee Flexion (Degrees): 90 Right Knee Extension (Degrees): 0 Additional Comments: Trapeze bar and foot roll   [name] 09/23/2015, 1:05 PM

## 2015-09-23 NOTE — Progress Notes (Signed)
Utilization review completed. Javyn Havlin, RN, BSN. 

## 2015-09-23 NOTE — Anesthesia Procedure Notes (Addendum)
Anesthesia Regional Block:  Femoral nerve block  Pre-Anesthetic Checklist: ,, timeout performed, Correct Patient, Correct Site, Correct Laterality, Correct Procedure, Correct Position, site marked, Risks and benefits discussed,  Surgical consent,  Pre-op evaluation,  At surgeon's request and post-op pain management  Laterality: Right  Prep: chloraprep       Needles:  Injection technique: Single-shot  Needle Type: Echogenic Needle     Needle Length: 9cm 9 cm Needle Gauge: 21 and 21 G    Additional Needles:  Procedures: ultrasound guided (picture in chart) Femoral nerve block Narrative:  Injection made incrementally with aspirations every 5 mL.  Performed by: Personally   Additional Notes: Patient tolerated the procedure well without complications   Procedure Name: Intubation Date/Time: 09/23/2015 10:00 AM Performed by: Adalberto Ill Pre-anesthesia Checklist: Patient identified, Emergency Drugs available, Suction available, Timeout performed and Patient being monitored Patient Re-evaluated:Patient Re-evaluated prior to inductionOxygen Delivery Method: Circle system utilized Preoxygenation: Pre-oxygenation with 100% oxygen Intubation Type: IV induction Ventilation: Mask ventilation without difficulty Laryngoscope Size: Miller and 2 Grade View: Grade I Tube type: Oral Tube size: 7.0 mm Number of attempts: 1 Airway Equipment and Method: Stylet Placement Confirmation: ETT inserted through vocal cords under direct vision,  positive ETCO2 and breath sounds checked- equal and bilateral Secured at: 22 cm Tube secured with: Tape Dental Injury: Teeth and Oropharynx as per pre-operative assessment

## 2015-09-23 NOTE — Anesthesia Postprocedure Evaluation (Signed)
Anesthesia Post Note  Patient: April [name]  Procedure(s) Performed: Procedure(s) (LRB): TOTAL KNEE ARTHROPLASTY (Right)  Patient location during evaluation: PACU Anesthesia Type: General and Regional Level of consciousness: awake and alert Pain management: pain level controlled Vital Signs Assessment: post-procedure vital signs reviewed and stable Respiratory status: spontaneous breathing Cardiovascular status: blood pressure returned to baseline Anesthetic complications: no    Last Vitals:  Filed Vitals:   09/23/15 1400 09/23/15 1415  BP:    Pulse: 94 63  Temp:  36.9 C  Resp: 17 17    Last Pain:  Filed Vitals:   09/23/15 1507  PainSc: 10-Worst pain ever                 Tiajuana Amass

## 2015-09-23 NOTE — Transfer of Care (Signed)
Immediate Anesthesia Transfer of Care Note  Patient: April [name]  Procedure(s) Performed: Procedure(s): TOTAL KNEE ARTHROPLASTY (Right)  Patient Location: PACU  Anesthesia Type:General  Level of Consciousness: awake, alert , oriented and patient cooperative  Airway & Oxygen Therapy: Patient Spontanous Breathing and Patient connected to nasal cannula oxygen  Post-op Assessment: Report given to RN and Post -op Vital signs reviewed and stable  Post vital signs: Reviewed and stable  Last Vitals:  Filed Vitals:   09/23/15 0935 09/23/15 0940  BP: 121/58 120/45  Pulse: 63 61  Temp:    Resp: 18 17    Complications: No apparent anesthesia complications

## 2015-09-23 NOTE — Evaluation (Signed)
Physical Therapy Evaluation Patient Details Name: April [name] MRN: [id number] DOB: 06/11/1961 Today's Date: 09/23/2015   History of Present Illness  Patient is a 54 y/o female admitted for R TKA.  PMH positive for GERD, back pain, anxiety and depression.  Clinical Impression  Patient presents with decreased mobility due to deficits listed in PT problem list. She will benefit from skilled PT in the acute setting to allow return home with family assist and HHPT.    Follow Up Recommendations Home health PT;Supervision/Assistance - 24 hour    Equipment Recommendations  None recommended by PT    Recommendations for Other Services       Precautions / Restrictions Precautions Precautions: Fall;Knee Required Braces or Orthoses: Knee Immobilizer - Right Knee Immobilizer - Right: On when out of bed or walking;Discontinue once straight leg raise with < 10 degree lag      Mobility  Bed Mobility Overal bed mobility: Needs Assistance Bed Mobility: Supine to Sit     Supine to sit: HOB elevated;Min assist     General bed mobility comments: for R LE and elevating trunk, cues for technique  Transfers Overall transfer level: Needs assistance Equipment used: Rolling walker (2 wheeled) Transfers: Sit to/from [facility] Sit to Stand: Mod assist Stand pivot transfers: Min assist       General transfer comment: cues for technique, increased time, lifting assist; bed to recliner with RW min A  Ambulation/Gait                Stairs            Wheelchair Mobility    Modified Rankin (Stroke Patients Only)       Balance Overall balance assessment: Needs assistance   Sitting balance-Leahy Scale: Good     Standing balance support: Bilateral upper extremity supported Standing balance-Leahy Scale: Poor                               Pertinent Vitals/Pain Pain Assessment: 0-10 Pain Score: 7  Pain Location: R knee Pain Descriptors  / Indicators: Sore Pain Intervention(s): Monitored during session;Limited activity within patient's tolerance;Repositioned    Home Living Family/patient expects to be discharged to:: Private residence Living Arrangements: Children Available Help at Discharge: Family Type of Home: House       Home Layout: One level Home Equipment: Environmental consultant - 2 wheels;Bedside commode      Prior Function Level of Independence: Independent               Hand Dominance        Extremity/Trunk Assessment               Lower Extremity Assessment: RLE deficits/detail RLE Deficits / Details: AROM grossly -8 to 30 increases with AAROM strength at least 3-/5 knee extension       Communication   Communication: No difficulties  Cognition Arousal/Alertness: Lethargic;Suspect due to medications Behavior During Therapy: [facility] for tasks assessed/performed Overall Cognitive Status: Within Functional Limits for tasks assessed                      General Comments      Exercises Total Joint Exercises Ankle Circles/Pumps: AROM;Both;5 reps;Supine Quad Sets: AROM;Right;5 reps;Supine Short Arc Quad: AROM;Right;5 reps;Supine Hip ABduction/ADduction: AROM;Right;5 reps;Supine      Assessment/Plan    PT Assessment Patient needs continued PT services  PT Diagnosis Difficulty walking;Acute pain   PT  Problem List Decreased strength;Decreased mobility;Decreased balance;Pain;Decreased activity tolerance;Decreased range of motion;Decreased knowledge of use of DME;Decreased knowledge of precautions  PT Treatment Interventions DME instruction;Gait training;Stair training;Balance training;Functional mobility training;Patient/family education;Therapeutic activities;Therapeutic exercise   PT Goals (Current goals can be found in the Care Plan section) Acute Rehab PT Goals Patient Stated Goal: To go home PT Goal Formulation: With patient Time For Goal Achievement: 09/27/15 Potential to Achieve  Goals: Good    Frequency 7X/week   Barriers to discharge        Co-evaluation               End of Session Equipment Utilized During Treatment: Gait belt Activity Tolerance: Patient tolerated treatment well Patient left: in chair;with call bell/phone within reach Nurse Communication: Mobility status         Time: [id number] PT Time Calculation (min) (ACUTE ONLY): 28 min   Charges:   PT Evaluation $Initial PT Evaluation Tier I: 1 Procedure PT Treatments $Therapeutic Activity: 8-22 mins   PT G Codes:        [place] Hert,CYNDI 06-Oct-2015, 7:04 PM  Magda Kiel, [place] 10/06/2015

## 2015-09-24 ENCOUNTER — Encounter (HOSPITAL_COMMUNITY): Payer: Self-pay | Admitting: Orthopedic Surgery

## 2015-09-24 LAB — BASIC METABOLIC PANEL
ANION GAP: 10 (ref 5–15)
BUN: 6 mg/dL (ref 6–20)
CALCIUM: 8.7 mg/dL — AB (ref 8.9–10.3)
CHLORIDE: 104 mmol/L (ref 101–111)
CO2: 24 mmol/L (ref 22–32)
Creatinine, Ser: 0.66 mg/dL (ref 0.44–1.00)
GFR calc non Af Amer: >60 mL/min (ref >60)
Glucose, Bld: 151 mg/dL — ABNORMAL HIGH (ref 65–99)
POTASSIUM: 3.8 mmol/L (ref 3.5–5.1)
Sodium: 138 mmol/L (ref 135–145)

## 2015-09-24 LAB — CBC
HCT: 37.5 % (ref 36.0–46.0)
HEMOGLOBIN: 11.7 g/dL — AB (ref 12.0–15.0)
MCH: 27 pg (ref 26.0–34.0)
MCHC: 31.2 g/dL (ref 30.0–36.0)
MCV: 86.4 fL (ref 78.0–100.0)
Platelets: 290 10*3/uL (ref 150–400)
RBC: 4.34 MIL/uL (ref 3.87–5.11)
RDW: 15 % (ref 11.5–15.5)
WBC: 10.9 10*3/uL — ABNORMAL HIGH (ref 4.0–10.5)

## 2015-09-24 NOTE — Progress Notes (Signed)
Physical Therapy Treatment Patient Details Name: April [name] MRN: [id number] DOB: 1961-04-07 Today's Date: 09/24/2015    History of Present Illness Patient is a 54 y/o female admitted for R TKA.  PMH positive for GERD, back pain, anxiety and depression.    PT Comments    Pt continues to improve quad control of R LE. She still has difficulties performing SLR but was able to ambulate with only mild knee buckling that did not greatly affect her standing balance. Ambulation limited due to fatigue but pt did not experience lightheadedness as she did during AM tx. Will continue to follow to improve R LE strength, ROM, and functional mobility.   Follow Up Recommendations  Home health PT;Supervision/Assistance - 24 hour     Equipment Recommendations       Recommendations for Other Services       Precautions / Restrictions Precautions Precautions: Fall;Knee Required Braces or Orthoses: Knee Immobilizer - Right Knee Immobilizer - Right: On when out of bed or walking;Discontinue once straight leg raise with < 10 degree lag Restrictions RLE Weight Bearing: Weight bearing as tolerated    Mobility  Bed Mobility Overal bed mobility: Modified Independent Bed Mobility: Sit to Supine              Transfers Overall transfer level: Needs assistance     Sit to Stand: Min guard         General transfer comment: cues for hand placement, sequence and safety  Ambulation/Gait Ambulation/Gait assistance: Min guard Ambulation Distance (Feet): 150 Feet Assistive device: Rolling walker (2 wheeled) Gait Pattern/deviations: Step-to pattern;Decreased stride length   Gait velocity interpretation: Below normal speed for age/gender General Gait Details: cues for posture, position in RW and sequence, cues for increased quad contraction in stance on RLE   Stairs            Wheelchair Mobility    Modified Rankin (Stroke Patients Only)       Balance Overall balance  assessment: Needs assistance   Sitting balance-Leahy Scale: Good     Standing balance support: Bilateral upper extremity supported Standing balance-Leahy Scale: Poor Standing balance comment: Needed RW during ambulation                    Cognition Arousal/Alertness: Awake/alert Behavior During Therapy: WFL for tasks assessed/performed Overall Cognitive Status: Within Functional Limits for tasks assessed                      Exercises Total Joint Exercises Heel Slides: AAROM;Right;15 reps;Supine Straight Leg Raises: AAROM;Seated;Right;10 reps Long Arc Quad: AROM;Seated;Right;15 reps    General Comments        Pertinent Vitals/Pain Pain Assessment: 0-10 Pain Score: 6  Pain Location: R Knee, incision site Pain Descriptors / Indicators: Aching Pain Intervention(s): Monitored during session;Repositioned    Home Living                      Prior Function            PT Goals (current goals can now be found in the care plan section) Progress towards PT goals: Progressing toward goals    Frequency       PT Plan Current plan remains appropriate    Co-evaluation             End of Session Equipment Utilized During Treatment: Gait belt Activity Tolerance: Patient tolerated treatment well Patient left: in bed;with call bell/phone within reach  Time: [id number] PT Time Calculation (min) (ACUTE ONLY): 27 min  Charges:  $Gait Training: 8-22 mins $Therapeutic Exercise: 8-22 mins                    G CodesHaynes Bast Oct 22, 2015, 2:07 PM Haynes Bast, SPT 10-22-2015 2:07 PM

## 2015-09-24 NOTE — Progress Notes (Signed)
Physical Therapy Treatment Patient Details Name: April [name] MRN: [id number] DOB: 1960-12-06 Today's Date: 09/24/2015    History of Present Illness Patient is a 54 y/o female admitted for R TKA.  PMH positive for GERD, back pain, anxiety and depression.    PT Comments    Pt demonstrated improved capacity for mobility today. She had one episode of dizziness that required sitting after 75' of ambulation but tolerated remaining activity well. Pt continues to have quad lag but there was no evidence of knee buckling when ambulating from chair to bed. Needed reeducation on use of CPM and extension foam. Will continue to follow to increase strength, ROM, and activity tolerance.   Follow Up Recommendations        Equipment Recommendations       Recommendations for Other Services       Precautions / Restrictions Precautions Precautions: Fall;Knee Required Braces or Orthoses: Knee Immobilizer - Right Knee Immobilizer - Right: On when out of bed or walking;Discontinue once straight leg raise with < 10 degree lag Restrictions RLE Weight Bearing: Weight bearing as tolerated    Mobility  Bed Mobility Overal bed mobility: Modified Independent Bed Mobility: Sit to Supine           General bed mobility comments: in chair on arrival. Returned to bed end of session  Transfers Overall transfer level: Needs assistance   Transfers: Sit to/from Stand Sit to Stand: Min guard         General transfer comment: cues for hand placement, sequence and safety  Ambulation/Gait Ambulation/Gait assistance: Min guard Ambulation Distance (Feet): 150 Feet Assistive device: Rolling walker (2 wheeled) Gait Pattern/deviations: Step-to pattern   Gait velocity interpretation: Below normal speed for age/gender General Gait Details: cues for posture, position in RW and sequence, breathing and increasing weight on RLE. Pt with seated rest after 50' due to presyncope   Stairs Stairs:  Yes Stairs assistance: Min guard Stair Management: Sideways;Step to pattern Number of Stairs: 2 General stair comments: cues for sequence   Wheelchair Mobility    Modified Rankin (Stroke Patients Only)       Balance Overall balance assessment: Needs assistance   Sitting balance-Leahy Scale: Good     Standing balance support: Bilateral upper extremity supported Standing balance-Leahy Scale: Poor Standing balance comment: Use of RW and stair handrail for balance when standing                    Cognition Arousal/Alertness: Awake/alert Behavior During Therapy: WFL for tasks assessed/performed Overall Cognitive Status: Within Functional Limits for tasks assessed                      Exercises Total Joint Exercises Heel Slides: AAROM;Right;10 reps;Supine Straight Leg Raises: AROM;Right;10 reps;Supine Long Arc Quad: AROM;Seated;Right;10 reps Goniometric ROM: 11-47    General Comments        Pertinent Vitals/Pain Pain Assessment: 0-10 Pain Score: 8  Pain Location: R Knee Pain Descriptors / Indicators: Aching Pain Intervention(s): Monitored during session;Repositioned    Home Living                      Prior Function            PT Goals (current goals can now be found in the care plan section) Progress towards PT goals: Progressing toward goals    Frequency       PT Plan Current plan remains appropriate    Co-evaluation  End of Session Equipment Utilized During Treatment: Gait belt Activity Tolerance: Patient tolerated treatment well Patient left: in bed;in CPM     Time: [id number] PT Time Calculation (min) (ACUTE ONLY): 43 min  Charges:  $Gait Training: 23-37 mins $Therapeutic Exercise: 8-22 mins                    G CodesHaynes Bast 09/27/15, 8:52 AM Haynes Bast, SPT 27-Sep-2015 8:52 AM

## 2015-09-24 NOTE — Care Management Note (Signed)
Case Management Note  Patient Details  Name: KASIDI GABE MRN: [id number] Date of Birth: 01-29-1961  Subjective/Objective:           S/p right total knee arthroplasty         Action/Plan: Spoke with patient about home health, she chose [facility]. Contacted Katie at [facility] and set up [place], order pending. [employer] has delivered CPM, rolling walker and 3N1 to her home. Patient stated that she will have family to assist her after discharge. Will continue to follow for discharge needs.      Expected Discharge Date:                  Expected Discharge Plan:  [place]  In-House Referral:  NA  Discharge planning Services  CM Consult  Post Acute Care Choice:  Durable Medical Equipment, Home Health Choice offered to:  Patient  DME Arranged:  3-N-1, CPM, Walker rolling DME Agency:  [employer]  HH Arranged:  PT [place]:  [place]  Status of Service:  Completed, signed off  Medicare Important Message Given:    Date Medicare IM Given:    Medicare IM give by:    Date Additional Medicare IM Given:    Additional Medicare Important Message give by:     If discussed at [place] of Stay Meetings, dates discussed:    Additional Comments:  Nila Nephew, RN 09/24/2015, 2:29 PM

## 2015-09-24 NOTE — Discharge Instructions (Addendum)
INSTRUCTIONS AFTER JOINT REPLACEMENT   o Remove items at home which could result in a fall. This includes throw rugs or furniture in walking pathways o ICE to the affected joint every three hours while awake for 30 minutes at a time, for at least the first 3-5 days, and then as needed for pain and swelling.  Continue to use ice for pain and swelling. You may notice swelling that will progress down to the foot and ankle.  This is normal after surgery.  Elevate your leg when you are not up walking on it.   o Continue to use the breathing machine you got in the hospital (incentive spirometer) which will help keep your temperature down.  It is common for your temperature to cycle up and down following surgery, especially at night when you are not up moving around and exerting yourself.  The breathing machine keeps your lungs expanded and your temperature down.   DIET:  As you were doing prior to hospitalization, we recommend a well-balanced diet.  DRESSING / WOUND CARE / SHOWERING  Keep the surgical dressing until follow up.  The dressing is water proof, so you can shower without any extra covering.  IF THE DRESSING FALLS OFF or the wound gets wet inside, change the dressing with sterile gauze.  Please use good hand washing techniques before changing the dressing.  Do not use any lotions or creams on the incision until instructed by your surgeon.    ACTIVITY  o Increase activity slowly as tolerated, but follow the weight bearing instructions below.   o No driving for 6 weeks or until further direction given by your physician.  You cannot drive while taking narcotics.  o No lifting or carrying greater than 10 lbs. until further directed by your surgeon. o Avoid periods of inactivity such as sitting longer than an hour when not asleep. This helps prevent blood clots.  o You may return to work once you are authorized by your doctor.     WEIGHT BEARING   Weight bearing as tolerated with a cane for  the next 2-3 weeks.  Use the cane as needed.  As balance and gait improve please discontinue the cane   EXERCISES  Results after joint replacement surgery are often greatly improved when you follow the exercise, range of motion and muscle strengthening exercises prescribed by your doctor. Safety measures are also important to protect the joint from further injury. Any time any of these exercises cause you to have increased pain or swelling, decrease what you are doing until you are comfortable again and then slowly increase them. If you have problems or questions, call your caregiver or physical therapist for advice.   Rehabilitation is important following a joint replacement. After just a few days of immobilization, the muscles of the leg can become weakened and shrink (atrophy).  These exercises are designed to build up the tone and strength of the thigh and leg muscles and to improve motion. Often times heat used for twenty to thirty minutes before working out will loosen up your tissues and help with improving the range of motion but do not use heat for the first two weeks following surgery (sometimes heat can increase post-operative swelling).   These exercises can be done on a training (exercise) mat, on the floor, on a table or on a bed. Use whatever works the best and is most comfortable for you.    Use music or television while you are exercising so that  the exercises are a pleasant break in your day. This will make your life better with the exercises acting as a break in your routine that you can look forward to.   Perform all exercises about fifteen times, three times per day or as directed.  You should exercise both the operative leg and the other leg as well.  Exercises include:    Quad Sets - Tighten up the muscle on the front of the thigh (Quad) and hold for 5-10 seconds.    Straight Leg Raises - With your knee straight (if you were given a brace, keep it on), lift the leg to 60  degrees, hold for 3 seconds, and slowly lower the leg.  Perform this exercise against resistance later as your leg gets stronger.   Leg Slides: Lying on your back, slowly slide your foot toward your buttocks, bending your knee up off the floor (only go as far as is comfortable). Then slowly slide your foot back down until your leg is flat on the floor again.   Angel Wings: Lying on your back spread your legs to the side as far apart as you can without causing discomfort.   Hamstring Strength:  Lying on your back, push your heel against the floor with your leg straight by tightening up the muscles of your buttocks.  Repeat, but this time bend your knee to a comfortable angle, and push your heel against the floor.  You may put a pillow under the heel to make it more comfortable if necessary.   A rehabilitation program following joint replacement surgery can speed recovery and prevent re-injury in the future due to weakened muscles. Contact your doctor or a physical therapist for more information on knee rehabilitation.    CONSTIPATION  Constipation is defined medically as fewer than three stools per week and severe constipation as less than one stool per week.  Even if you have a regular bowel pattern at home, your normal regimen is likely to be disrupted due to multiple reasons following surgery.  Combination of anesthesia, postoperative narcotics, change in appetite and fluid intake all can affect your bowels.   YOU MUST use at least one of the following options; they are listed in order of increasing strength to get the job done.  They are all available over the counter, and you may need to use some, POSSIBLY even all of these options:    Drink plenty of fluids (prune juice may be helpful) and high fiber foods Colace 100 mg by mouth twice a day  Senokot for constipation as directed and as needed Dulcolax (bisacodyl), take with full glass of water  Miralax (polyethylene glycol) once or twice a  day as needed.  If you have tried all these things and are unable to have a bowel movement in the first 3-4 days after surgery call either your surgeon or your primary doctor.    If you experience loose stools or diarrhea, hold the medications until you stool forms back up.  If your symptoms do not get better within 1 week or if they get worse, check with your doctor.  If you experience "the worst abdominal pain ever" or develop nausea or vomiting, please contact the office immediately for further recommendations for treatment.   ITCHING:  If you experience itching with your medications, try taking only a single pain pill, or even half a pain pill at a time.  You can also use Benadryl over the counter for itching or also to  help with sleep.   TED HOSE STOCKINGS:  Use stockings on both legs until for at least 2 weeks or as directed by physician office. They may be removed at night for sleeping.  MEDICATIONS:  See your medication summary on the After Visit Summary that nursing will review with you.  You may have some home medications which will be placed on hold until you complete the course of blood thinner medication.  It is important for you to complete the blood thinner medication as prescribed.  PRECAUTIONS:  If you experience chest pain or shortness of breath - call 911 immediately for transfer to the hospital emergency department.   If you develop a fever greater that 101 F, purulent drainage from wound, increased redness or drainage from wound, foul odor from the wound/dressing, or calf pain - CONTACT YOUR SURGEON.                                                   FOLLOW-UP APPOINTMENTS:  If you do not already have a post-op appointment, please call the office for an appointment to be seen by your surgeon.  Guidelines for how soon to be seen are listed in your After Visit Summary, but are typically between 1-4 weeks after surgery.  OTHER INSTRUCTIONS:   Knee Replacement:  Do not place  pillow under knee, focus on keeping the knee straight while resting. CPM instructions: 0-90 degrees, 2 hours in the morning, 2 hours in the afternoon, and 2 hours in the evening. Place foam block, curve side up under heel at all times except when in CPM or when walking.  DO NOT modify, tear, cut, or change the foam block in any way.  MAKE SURE YOU:   Understand these instructions.   Get help right away if you are not doing well or get worse.    Thank you for letting us be a part of your medical care team.  It is a privilege we respect greatly.  We hope these instructions will help you stay on track for a fast and full recovery!      Information on my medicine - ELIQUIS (apixaban)  This medication education was reviewed with me or my healthcare representative as part of my discharge preparation.  The pharmacist that spoke with me during my hospital stay was:  Arty Baumgartner, [facility]  Why was Eliquis prescribed for you? Eliquis was prescribed for you to reduce the risk of blood clots forming after orthopedic surgery.    What do You need to know about Eliquis? Take your Eliquis TWICE DAILY - one tablet in the morning and one tablet in the evening with or without food.  It would be best to take the dose about the same time each day.  If you have difficulty swallowing the tablet whole please discuss with your pharmacist how to take the medication safely.  Take Eliquis exactly as prescribed by your doctor and DO NOT stop taking Eliquis without talking to the doctor who prescribed the medication.  Stopping without other medication to take the place of Eliquis may increase your risk of developing a clot.  After discharge, you should have regular check-up appointments with your healthcare provider that is prescribing your Eliquis.  What do you do if you miss a dose? If a dose of ELIQUIS is not taken at the  scheduled time, take it as soon as possible on the same day and twice-daily  administration should be resumed.  The dose should not be doubled to make up for a missed dose.  Do not take more than one tablet of ELIQUIS at the same time.  Important Safety Information A possible side effect of Eliquis is bleeding. You should call your healthcare provider right away if you experience any of the following: ? Bleeding from an injury or your nose that does not stop. ? Unusual colored urine (red or dark brown) or unusual colored stools (red or black). ? Unusual bruising for unknown reasons. ? A serious fall or if you hit your head (even if there is no bleeding).  Some medicines may interact with Eliquis and might increase your risk of bleeding or clotting while on Eliquis. To help avoid this, consult your healthcare provider or pharmacist prior to using any new prescription or non-prescription medications, including herbals, vitamins, non-steroidal anti-inflammatory drugs (NSAIDs) and supplements.  This website has more information on Eliquis (apixaban): http://www.eliquis.com/eliquis/home

## 2015-09-25 LAB — BASIC METABOLIC PANEL
Anion gap: 9 (ref 5–15)
BUN: 8 mg/dL (ref 6–20)
CALCIUM: 8.9 mg/dL (ref 8.9–10.3)
CO2: 27 mmol/L (ref 22–32)
CREATININE: 0.55 mg/dL (ref 0.44–1.00)
Chloride: 100 mmol/L — ABNORMAL LOW (ref 101–111)
Glucose, Bld: 101 mg/dL — ABNORMAL HIGH (ref 65–99)
Potassium: 4.6 mmol/L (ref 3.5–5.1)
SODIUM: 136 mmol/L (ref 135–145)

## 2015-09-25 LAB — CBC
HEMATOCRIT: 37.2 % (ref 36.0–46.0)
Hemoglobin: 12.5 g/dL (ref 12.0–15.0)
MCH: 28.3 pg (ref 26.0–34.0)
MCHC: 33.6 g/dL (ref 30.0–36.0)
MCV: 84.2 fL (ref 78.0–100.0)
PLATELETS: 304 10*3/uL (ref 150–400)
RBC: 4.42 MIL/uL (ref 3.87–5.11)
RDW: 15.7 % — AB (ref 11.5–15.5)
WBC: 14.6 10*3/uL — ABNORMAL HIGH (ref 4.0–10.5)

## 2015-09-25 MED ORDER — OXYCODONE HCL 5 MG PO TABS
15.0000 mg | ORAL_TABLET | ORAL | Status: DC | PRN
  Administered 2015-09-25 – 2015-09-27 (×15): 15 mg via ORAL
  Filled 2015-09-25 (×15): qty 3

## 2015-09-25 NOTE — Progress Notes (Signed)
Discharge plan has changed to outpatient therapy rather than home health PT. Outpatient therapy set up by MD office. Spoke with patient, she is aware of change in discharge plan. Spoke with Katie at [place] and cancelled [place]. Will continue to follow for discharge needs.

## 2015-09-25 NOTE — Evaluation (Signed)
Occupational Therapy Evaluation Patient Details Name: April [name] MRN: [id number] DOB: 1961-08-09 Today's Date: 09/25/2015    History of Present Illness Patient is a 54 y/o female admitted for R TKA.  PMH positive for GERD, back pain, anxiety and depression.   Clinical Impression   Pt reports she was independent with ADLs PTA. Currently pt is overall min guard for ADLs and functional mobility with the exception of mod assist for LB ADLs. Began safety and ADL education with pt; she verbalized understanding. Pt planning to d/c home with 24/7 supervision from family and friends. Pt would benefit from continued skilled OT in order to maximize independence and safety with LB ADLs using AE, toilet transfers, and tub transfers with use of a 3 in 1.     Follow Up Recommendations  No OT follow up;Supervision/Assistance - 24 hour    Equipment Recommendations  3 in 1 bedside comode;Other (comment) (AE: lh sponge, lh shoe horn, reacher, sock aide)    Recommendations for Other Services       Precautions / Restrictions Precautions Precautions: Fall;Knee Required Braces or Orthoses: Knee Immobilizer - Right Knee Immobilizer - Right: On when out of bed or walking;Discontinue once straight leg raise with < 10 degree lag Restrictions Weight Bearing Restrictions: Yes RLE Weight Bearing: Weight bearing as tolerated      Mobility Bed Mobility Overal bed mobility: Modified Independent Bed Mobility: Sit to Supine              Transfers Overall transfer level: Needs assistance Equipment used: Rolling walker (2 wheeled) Transfers: Sit to/from Stand Sit to Stand: Min guard         General transfer comment: Min guard for safety. KI applied prior to standing. Sit to stand from EOB x 1, BSC x 2. Good hand placement and technique.    Balance Overall balance assessment: Needs assistance Sitting-balance support: Feet supported Sitting balance-Leahy Scale: Good     Standing  balance support: No upper extremity supported;During functional activity Standing balance-Leahy Scale: Fair Standing balance comment: Pt able to stand at sink to wash hands without UE support                            ADL Overall ADL's : Needs assistance/impaired Eating/Feeding: Set up;Sitting   Grooming: Min guard;Wash/dry hands;Standing       Lower Body Bathing: Minimal assistance;Sit to/from stand       Lower Body Dressing: Moderate assistance;Sit to/from stand Lower Body Dressing Details (indicate cue type and reason): Pt unable to reach bilateral feet. Donned underwear with mod assist to thread bilateral LEs, pt able to pull up underwear in standing. Disucssed use of AE for increased independence with LB ADLs; pt agreeable to use of AE, will plan on practicing next session. Educated on compensatory strategies for LB ADLs; pt verbalized understanding. Toilet Transfer: Min guard;Ambulation;BSC;RW (BSC over toilet) Toilet Transfer Details (indicate cue type and reason): Educated on use of 3 in 1 over toilet Toileting- Water quality scientist and Hygiene: Min guard;Sit to/from stand Toileting - Clothing Manipulation Details (indicate cue type and reason): For toilet hygiene and clothing manipulation   Tub/Shower Transfer Details (indicate cue type and reason): Educated on use of 3 in 1 in tub; plan to practice next session. Functional mobility during ADLs: Min guard;Rolling walker General ADL Comments: Family and friends present during OT eval. Educated pt on home safety, need for supervision during ADLs and mobility, ice for edema and  pain; pt verbalized understanding.     Vision     Perception     Praxis      Pertinent Vitals/Pain Pain Assessment: Faces Faces Pain Scale: Hurts little more Pain Location: R knee Pain Descriptors / Indicators: Throbbing Pain Intervention(s): Limited activity within patient's tolerance;Monitored during session;Repositioned;Ice  applied     Hand Dominance     Extremity/Trunk Assessment Upper Extremity Assessment Upper Extremity Assessment: Overall WFL for tasks assessed   Lower Extremity Assessment Lower Extremity Assessment: Defer to PT evaluation   Cervical / Trunk Assessment Cervical / Trunk Assessment: Normal   Communication Communication Communication: No difficulties   Cognition Arousal/Alertness: Awake/alert Behavior During Therapy: WFL for tasks assessed/performed Overall Cognitive Status: Within Functional Limits for tasks assessed                     General Comments       Exercises       Shoulder Instructions      Home Living Family/patient expects to be discharged to:: Private residence Living Arrangements: Children Available Help at Discharge: Family;Friend(s);Available 24 hours/day Type of Home: Other(Comment) (townhome)       Home Layout: Two level;Bed/bath upstairs     Bathroom Shower/Tub: Teacher, early years/pre: Standard Bathroom Accessibility: Yes How Accessible: Accessible via walker Home Equipment: [place] - 2 wheels;Bedside commode          Prior Functioning/Environment Level of Independence: Independent             OT Diagnosis: Acute pain;Generalized weakness   OT Problem List: Decreased activity tolerance;Impaired balance (sitting and/or standing);Decreased safety awareness;Decreased knowledge of use of DME or AE;Decreased knowledge of precautions;Obesity;Pain   OT Treatment/Interventions: Self-care/ADL training;DME and/or AE instruction;Patient/family education    OT Goals(Current goals can be found in the care plan section) Acute Rehab OT Goals Patient Stated Goal: return to independence OT Goal Formulation: With patient Time For Goal Achievement: 10/09/15 Potential to Achieve Goals: Good ADL Goals Pt Will Perform Grooming: with supervision;standing Pt Will Perform Lower Body Bathing: with supervision;with adaptive  equipment;sit to/from stand Pt Will Perform Lower Body Dressing: with supervision;with adaptive equipment;sit to/from stand Pt Will Transfer to Toilet: with supervision;ambulating;bedside commode Pt Will Perform Toileting - Clothing Manipulation and hygiene: with supervision;sit to/from stand Pt Will Perform Tub/Shower Transfer: Tub transfer;with supervision;ambulating;3 in 1;rolling walker  OT Frequency: Min 2X/week   Barriers to D/C: Inaccessible home environment  Bedroom and bathroom on second floor       Co-evaluation              End of Session Equipment Utilized During Treatment: Gait belt;Rolling walker;Right knee immobilizer CPM Right Knee CPM Right Knee: Off  Activity Tolerance: Patient tolerated treatment well Patient left: in chair;with call bell/phone within reach;with family/visitor present   Time: [id number] OT Time Calculation (min): 26 min Charges:  OT General Charges $OT Visit: 1 Procedure OT Evaluation $Initial OT Evaluation Tier I: 1 Procedure OT Treatments $Self Care/Home Management : 8-22 mins G-Codes:     Binnie Kand M.S., OTR/L Pager: [phone]  09/25/2015, 3:58 PM

## 2015-09-25 NOTE — Progress Notes (Signed)
Physical Therapy Treatment Patient Details Name: April [name] MRN: [id number] DOB: 1961-06-27 Today's Date: 09/25/2015    History of Present Illness Patient is a 54 y/o female admitted for R TKA.  PMH positive for GERD, back pain, anxiety and depression.    PT Comments    Pt was able to be seen for visit but did note she was in the hallway without immobilizer.  Talked with her about getting away from using the brace as it is for safety and protection of the joint and her standing control until the knee is stronger and safer to walk.  Pt is in agreement with this plan.  Follow Up Recommendations  Home health PT;Supervision/Assistance - 24 hour     Equipment Recommendations       Recommendations for Other Services       Precautions / Restrictions Precautions Precautions: Fall;Knee Precaution Booklet Issued: Yes (comment) Required Braces or Orthoses: Knee Immobilizer - Right (Pt was up in hallway with daughter not wearing brace) Knee Immobilizer - Right: On when out of bed or walking;Discontinue once straight leg raise with < 10 degree lag Restrictions Weight Bearing Restrictions: Yes RLE Weight Bearing: Weight bearing as tolerated    Mobility  Bed Mobility Overal bed mobility: Modified Independent Bed Mobility: Sit to Supine           General bed mobility comments: up when PT arrived  Transfers Overall transfer level: Needs assistance Equipment used: Rolling walker (2 wheeled) Transfers: Sit to/from [facility] Sit to Stand: Min guard;Min assist Stand pivot transfers: Min guard       General transfer comment: applied knee brace after pt walked due to not having on when PT arrived.  Reminders for hand placement.  Ambulation/Gait Ambulation/Gait assistance: Min assist;Min guard Ambulation Distance (Feet): 40 Feet (end of  her walk) Assistive device: Rolling walker (2 wheeled) Gait Pattern/deviations: Step-to pattern;Decreased dorsiflexion -  right;Decreased dorsiflexion - left;Trunk flexed;Narrow base of support Gait velocity: redcued Gait velocity interpretation: Below normal speed for age/gender General Gait Details: upright posture and step length cued   Stairs            Wheelchair Mobility    Modified Rankin (Stroke Patients Only)       Balance Overall balance assessment: Needs assistance Sitting-balance support: Feet supported Sitting balance-Leahy Scale: Good     Standing balance support: No upper extremity supported;During functional activity Standing balance-Leahy Scale: Fair Standing balance comment: prompts to use walker for controlling balance                    Cognition Arousal/Alertness: Awake/alert Behavior During Therapy: WFL for tasks assessed/performed Overall Cognitive Status: Within Functional Limits for tasks assessed                      Exercises Total Joint Exercises Ankle Circles/Pumps: AROM;Both;5 reps Quad Sets: AROM;Both;10 reps Gluteal Sets: AROM;Both;10 reps Heel Slides: AROM;AAROM;Both;10 reps Hip ABduction/ADduction: AROM;AAROM;Both;10 reps    General Comments        Pertinent Vitals/Pain Pain Assessment: 0-10 Pain Score: 7  Faces Pain Scale: Hurts little more Pain Location: R knee Pain Descriptors / Indicators: Aching;Cramping Pain Intervention(s): Monitored during session;Repositioned;Patient requesting pain meds-RN notified;Limited activity within patient's tolerance;Ice applied    Home Living Family/patient expects to be discharged to:: Private residence Living Arrangements: Children Available Help at Discharge: Family;Friend(s);Available 24 hours/day Type of Home: Other(Comment) (townhome)     Home Layout: Two level;Bed/bath upstairs Home Equipment: [place] -  2 wheels;Bedside commode      Prior Function Level of Independence: Independent          PT Goals (current goals can now be found in the care plan section) Acute Rehab PT  Goals Patient Stated Goal: return to independence    Frequency  7X/week    PT Plan      Co-evaluation             End of Session Equipment Utilized During Treatment: Gait belt Activity Tolerance: Patient tolerated treatment well Patient left: in chair     Time: [id number] PT Time Calculation (min) (ACUTE ONLY): 27 min  Charges:  $Gait Training: 8-22 mins $Therapeutic Activity: 8-22 mins                    G Codes:      Ramond Dial 10-16-15, 5:27 PM   Mee Hives, PT MS Acute Rehab Dept. Number: [facility] [id number] and [place] [phone]

## 2015-09-25 NOTE — Progress Notes (Signed)
Subjective: 2 Days Post-Op Procedure(s) (LRB): TOTAL KNEE ARTHROPLASTY (Right) Patient reports pain as 7 on 0-10 scale.    Objective: Vital signs in last 24 hours: Temp:  [98.4 F (36.9 C)-98.6 F (37 C)] 98.6 F (37 C) (12/14 0631) Pulse Rate:  [84-91] 91 (12/14 0631) Resp:  [18] 18 (12/14 0631) BP: (121-133)/(66-81) 133/66 mmHg (12/14 0631) SpO2:  [99 %-100 %] 99 % (12/14 0631)  Intake/Output from previous day: 12/13 0701 - 12/14 0700 In: 960 [P.O.:960] Out: -  Intake/Output this shift:     Recent Labs  09/24/15 0640 09/25/15 0525  HGB 11.7* 12.5    Recent Labs  09/24/15 0640 09/25/15 0525  WBC 10.9* 14.6*  RBC 4.34 4.42  HCT 37.5 37.2  PLT 290 304    Recent Labs  09/24/15 0640 09/25/15 0525  NA 138 136  K 3.8 4.6  CL 104 100*  CO2 24 27  BUN 6 8  CREATININE 0.66 0.55  GLUCOSE 151* 101*  CALCIUM 8.7* 8.9   No results for input(s): LABPT, INR in the last 72 hours.  ABD soft Neurovascular intact Sensation intact distally Intact pulses distally Dorsiflexion/Plantar flexion intact Incision: dressing C/D/I  Assessment/Plan: 2 Days Post-Op Procedure(s) (LRB): TOTAL KNEE ARTHROPLASTY (Right) Advance diet Up with therapy    Needs bone foam.  Expect patient to be here until Thursday or Friday depending on Mobility.  Starts outpatient PT on Monday.  No home health PT since patient is very limited by insurance to the number of PT visits  Axxel Gude J 09/25/2015, 8:47 AM

## 2015-09-25 NOTE — Progress Notes (Signed)
Orthopedic Tech Progress Note Patient Details:  April [name] 1960/11/13 [id number] On cpm at 1900 Patient ID: April [name], female   DOB: [birth year], 54 y.o.   MRN: [id number]   Braulio Bosch 09/25/2015, 6:52 PM

## 2015-09-25 NOTE — Progress Notes (Signed)
Physical Therapy Treatment Patient Details Name: April [name] MRN: [id number] DOB: 1961/09/30 Today's Date: 09/25/2015    History of Present Illness Patient is a 54 y/o female admitted for R TKA.  PMH positive for GERD, back pain, anxiety and depression.    PT Comments    Pt is getting up to chair and doing all exercises with excellent effort.  Will need to get meds for pain before session in the future, did tolerate due to nurse waiting for a bigger dose to be ordered by pharmacy and elected to go ahead.  Has made good progress toward going home.  Follow Up Recommendations  Home health PT;Supervision/Assistance - 24 hour     Equipment Recommendations       Recommendations for Other Services       Precautions / Restrictions Precautions Precautions: Fall;Knee Precaution Booklet Issued: Yes (comment) Required Braces or Orthoses: Knee Immobilizer - Right (Pt was up in hallway with daughter not wearing brace) Knee Immobilizer - Right: On when out of bed or walking;Discontinue once straight leg raise with < 10 degree lag Restrictions Weight Bearing Restrictions: Yes RLE Weight Bearing: Weight bearing as tolerated    Mobility  Bed Mobility Overal bed mobility: Modified Independent Bed Mobility: Sit to Supine           General bed mobility comments: up when PT arrived  Transfers Overall transfer level: Needs assistance Equipment used: Rolling walker (2 wheeled) Transfers: Sit to/from [facility] Sit to Stand: Min guard;Min assist Stand pivot transfers: Min guard       General transfer comment: applied knee brace after pt walked due to not having on when PT arrived.  Reminders for hand placement.  Ambulation/Gait Ambulation/Gait assistance: Min guard Ambulation Distance (Feet): 150 Feet Assistive device: Rolling walker (2 wheeled) Gait Pattern/deviations: Step-to pattern;Decreased dorsiflexion - right;Decreased dorsiflexion - left;Trunk  flexed;Narrow base of support Gait velocity: redcued Gait velocity interpretation: Below normal speed for age/gender General Gait Details: upright posture and step length cued   Stairs            Wheelchair Mobility    Modified Rankin (Stroke Patients Only)       Balance Overall balance assessment: Needs assistance Sitting-balance support: Feet supported Sitting balance-Leahy Scale: Good     Standing balance support: No upper extremity supported;During functional activity Standing balance-Leahy Scale: Fair Standing balance comment: prompts to use walker for controlling balance                    Cognition Arousal/Alertness: Awake/alert Behavior During Therapy: WFL for tasks assessed/performed Overall Cognitive Status: Within Functional Limits for tasks assessed                      Exercises Total Joint Exercises Ankle Circles/Pumps: AROM;Both;5 reps Quad Sets: AROM;Both;10 reps Gluteal Sets: AROM;Both;10 reps Heel Slides: AROM;AAROM;Both;10 reps Hip ABduction/ADduction: AROM;AAROM;Both;10 reps    General Comments        Pertinent Vitals/Pain Pain Assessment: 0-10 Pain Score: 7  Faces Pain Scale: Hurts little more Pain Location: R knee Pain Descriptors / Indicators: Aching;Cramping Pain Intervention(s): Monitored during session;Repositioned;Patient requesting pain meds-RN notified;Limited activity within patient's tolerance;Ice applied    Home Living Family/patient expects to be discharged to:: Private residence Living Arrangements: Children Available Help at Discharge: Family;Friend(s);Available 24 hours/day Type of Home: Other(Comment) (townhome)     Home Layout: Two level;Bed/bath upstairs Home Equipment: [place] - 2 wheels;Bedside commode      Prior Function Level  of Independence: Independent          PT Goals (current goals can now be found in the care plan section) Acute Rehab PT Goals Patient Stated Goal: return to  independence    Frequency  7X/week    PT Plan      Co-evaluation             End of Session Equipment Utilized During Treatment: Gait belt Activity Tolerance: Patient tolerated treatment well Patient left: in chair     Time: [id number] PT Time Calculation (min) (ACUTE ONLY): 38 min  Charges:  $Gait Training: 8-22 mins $Therapeutic Exercise: 8-22 mins $Therapeutic Activity: 8-22 mins                    G Codes:      Ramond Dial 03-Oct-2015, 5:50 PM   Mee Hives, PT MS Acute Rehab Dept. Number: [facility] [id number] and [place] [phone]

## 2015-09-26 LAB — BASIC METABOLIC PANEL
Anion gap: 10 (ref 5–15)
BUN: 8 mg/dL (ref 6–20)
CHLORIDE: 98 mmol/L — AB (ref 101–111)
CO2: 27 mmol/L (ref 22–32)
Calcium: 8.8 mg/dL — ABNORMAL LOW (ref 8.9–10.3)
Creatinine, Ser: 0.63 mg/dL (ref 0.44–1.00)
GFR calc Af Amer: >60 mL/min (ref >60)
GFR calc non Af Amer: >60 mL/min (ref >60)
GLUCOSE: 107 mg/dL — AB (ref 65–99)
POTASSIUM: 4.7 mmol/L (ref 3.5–5.1)
Sodium: 135 mmol/L (ref 135–145)

## 2015-09-26 LAB — CBC
HCT: 37 % (ref 36.0–46.0)
Hemoglobin: 12 g/dL (ref 12.0–15.0)
MCH: 28 pg (ref 26.0–34.0)
MCHC: 32.4 g/dL (ref 30.0–36.0)
MCV: 86.2 fL (ref 78.0–100.0)
Platelets: 289 10*3/uL (ref 150–400)
RBC: 4.29 MIL/uL (ref 3.87–5.11)
RDW: 15.6 % — ABNORMAL HIGH (ref 11.5–15.5)
WBC: 11.6 10*3/uL — ABNORMAL HIGH (ref 4.0–10.5)

## 2015-09-26 NOTE — Progress Notes (Signed)
Physical Therapy Treatment Patient Details Name: April [name] MRN: [id number] DOB: 10-12-1961 Today's Date: 09/26/2015    History of Present Illness Patient is a 54 y/o female admitted for R TKA.  PMH positive for GERD, back pain, anxiety and depression.    PT Comments    Pt continues progressing well with all activity and gait. Pt able to complete long hall distance with use of cane and complete flight of stairs. Pt educated for bone foam and HEP with encouragement to continue progression. Will continue to follow to maximize independence with gait.   Follow Up Recommendations  Supervision/Assistance - 24 hour;Outpatient PT     Equipment Recommendations  Cane    Recommendations for Other Services       Precautions / Restrictions Precautions Precautions: Fall;Knee Restrictions Weight Bearing Restrictions: Yes RLE Weight Bearing: Weight bearing as tolerated    Mobility  Bed Mobility               General bed mobility comments: in chair on arrival  Transfers Overall transfer level: Modified independent   Transfers: Sit to/from Stand              Ambulation/Gait Ambulation/Gait assistance: Min guard Ambulation Distance (Feet): 300 Feet Assistive device: Straight cane Gait Pattern/deviations: Step-to pattern;Decreased stride length;Trunk flexed   Gait velocity interpretation: Below normal speed for age/gender General Gait Details: cues for upright posture, increased knee flexion with swing and heel strike on RLE. cues for proper cane use with gait   Stairs Stairs: Yes Stairs assistance: Supervision Stair Management: Sideways;Forwards;With cane;One rail Left;One rail Right Number of Stairs: 11 General stair comments: pt ascended 5 steps sideways with right rail, 6 steps forward with left rail and cane, descended all stairs with cane and rail . Pt rail switches halfway up at home and practiced accordingly. Cues for sequence throughout  Wheelchair  Mobility    Modified Rankin (Stroke Patients Only)       Balance Overall balance assessment: Needs assistance   Sitting balance-Leahy Scale: Good       Standing balance-Leahy Scale: Fair                      Cognition Arousal/Alertness: Awake/alert Behavior During Therapy: WFL for tasks assessed/performed Overall Cognitive Status: Within Functional Limits for tasks assessed                      Exercises Total Joint Exercises Hip ABduction/ADduction: AROM;Seated;15 reps;Both Straight Leg Raises: AAROM;Seated;Right;15 reps Long Arc Quad: AROM;Seated;Right;15 reps Marching in Standing: AROM;Seated;Both;15 reps    General Comments        Pertinent Vitals/Pain Pain Score: 9  Pain Location: right knee Pain Descriptors / Indicators: Aching Pain Intervention(s): Limited activity within patient's tolerance;Monitored during session;Premedicated before session;Repositioned;Ice applied    Home Living                      Prior Function            PT Goals (current goals can now be found in the care plan section) Progress towards PT goals: Progressing toward goals    Frequency       PT Plan Discharge plan needs to be updated    Co-evaluation             End of Session Equipment Utilized During Treatment: Gait belt Activity Tolerance: Patient tolerated treatment well Patient left: in chair;with call bell/phone within reach  Time: [id number] PT Time Calculation (min) (ACUTE ONLY): 36 min  Charges:  $Gait Training: 8-22 mins $Therapeutic Exercise: 8-22 mins                    G Codes:      Melford Aase 10-18-15, 10:48 AM Elwyn Reach, [place]

## 2015-09-26 NOTE — Progress Notes (Signed)
Orthopedic Tech Progress Note Patient Details:  April [name] 08-27-1961 [id number] On cpm at 1900 Patient ID: [name], female   DOB: 06/21/1961, 54 y.o.   MRN: [id number]   Braulio Bosch 09/26/2015, 6:53 PM

## 2015-09-26 NOTE — Progress Notes (Signed)
Subjective: 3 Days Post-Op Procedure(s) (LRB): TOTAL KNEE ARTHROPLASTY (Right) Patient reports pain as 4 on 0-10 scale.    Objective: Vital signs in last 24 hours: Temp:  [97.9 F (36.6 C)-98.6 F (37 C)] 98.2 F (36.8 C) (12/15 0545) Pulse Rate:  [74-110] 74 (12/15 0545) Resp:  [18] 18 (12/15 0545) BP: (131-139)/(72-93) 133/75 mmHg (12/15 0545) SpO2:  [98 %-99 %] 99 % (12/15 0545)  Intake/Output from previous day: 12/14 0701 - 12/15 0700 In: 840 [P.O.:840] Out: -  Intake/Output this shift:     Recent Labs  09/24/15 0640 09/25/15 0525 09/26/15 0615  HGB 11.7* 12.5 12.0    Recent Labs  09/25/15 0525 09/26/15 0615  WBC 14.6* 11.6*  RBC 4.42 4.29  HCT 37.2 37.0  PLT 304 289    Recent Labs  09/25/15 0525 09/26/15 0615  NA 136 135  K 4.6 4.7  CL 100* 98*  CO2 27 27  BUN 8 8  CREATININE 0.55 0.63  GLUCOSE 101* 107*  CALCIUM 8.9 8.8*   No results for input(s): LABPT, INR in the last 72 hours.  ABD soft Neurovascular intact Sensation intact distally Intact pulses distally Dorsiflexion/Plantar flexion intact Incision: dressing C/D/I  Assessment/Plan: 3 Days Post-Op Procedure(s) (LRB): TOTAL KNEE ARTHROPLASTY (Right)  Principal Problem:   Primary localized osteoarthritis of right knee Active Problems:   HYPERCHOLESTEROLEMIA   Morbid obesity (HCC)   Anemia   Anxiety state   Depression   GERD   Low back pain   DJD (degenerative joint disease) of knee  Advance diet Up with therapy Plan for discharge tomorrow   Will start outpatient PT on Monday  Javarian Jakubiak J 09/26/2015, 9:13 AM

## 2015-09-26 NOTE — Progress Notes (Signed)
Orthopedic Tech Progress Note Patient Details:  April [name] [birth year] [id number] Off cpm at 2130 Patient ID: April [name], female   DOB: 13-Dec-1960, 54 y.o.   MRN: [id number]   Braulio Bosch 09/26/2015, 9:25 PM

## 2015-09-26 NOTE — Progress Notes (Signed)
Occupational Therapy Treatment Patient Details Name: April [name] MRN: [id number] DOB: 1961/05/15 Today's Date: 09/26/2015    History of present illness Patient is a 54 y/o female admitted for R TKA.  PMH positive for GERD, back pain, anxiety and depression.   OT comments  Pt limited by pain this AM, declined functional mobility activities but agreed to participate in AE education. Educated pt on use of long handled sponge, long handled shoe horn, reacher, and sock aide; pt verbalized and return demonstrated understanding of AE. Provided pt with AE kit for use upon d/c home. Need to practice tub transfer with 3 in 1 prior to d/c home. Will continue to follow pt acutely.    Follow Up Recommendations  No OT follow up;Supervision/Assistance - 24 hour    Equipment Recommendations  3 in 1 bedside comode;Other (comment) (AE)    Recommendations for Other Services      Precautions / Restrictions Precautions Precautions: Fall;Knee Restrictions Weight Bearing Restrictions: Yes RLE Weight Bearing: Weight bearing as tolerated       Mobility Bed Mobility               General bed mobility comments: Pt OOB in chair  Transfers Overall transfer level: Modified independent   Transfers: Sit to/from Stand                Balance Overall balance assessment: Needs assistance   Sitting balance-Leahy Scale: Good       Standing balance-Leahy Scale: Fair                     ADL Overall ADL's : Needs assistance/impaired               Lower Body Bathing Details (indicate cue type and reason): Educated on use of long handled sponge for increased independence with LB bathing; pt verbalized understanding.     Lower Body Dressing: Min guard;Sit to/from stand Lower Body Dressing Details (indicate cue type and reason): Educated on use of long handled shoe horn, reacher, and sock aide. Pt able to retrun demonstrate use of reacher for doffing socks and donning LB  clothing; sock aide to don socks. Pt verbalized understanding of long handled shoe horn. Provided pt with AE kit for use upon d/c home. Educated on compensatory strategies.               General ADL Comments: No family present for OT session. Pt reports she is in too much pain right now to participate in out of chair activities. Will defer practicing tub transfer with 3 in 1 until next session secondary to pain.      Vision                     Perception     Praxis      Cognition   Behavior During Therapy: [facility] for tasks assessed/performed Overall Cognitive Status: Within Functional Limits for tasks assessed                       Extremity/Trunk Assessment               Exercises Total Joint Exercises Hip ABduction/ADduction: AROM;Seated;15 reps;Both Straight Leg Raises: AAROM;Seated;Right;15 reps Long Arc Quad: AROM;Seated;Right;15 reps Marching in Standing: AROM;Seated;Both;15 reps   Shoulder Instructions       General Comments      Pertinent Vitals/ Pain       Pain Assessment: 0-10 Pain Score:  ("  12") Pain Location: R knee Pain Descriptors / Indicators: Aching;Stabbing Pain Intervention(s): Limited activity within patient's tolerance;Monitored during session;RN gave pain meds during session;Ice applied  Home Living                                          Prior Functioning/Environment              Frequency Min 2X/week     Progress Toward Goals  OT Goals(current goals can now be found in the care plan section)  Progress towards OT goals: Progressing toward goals  Acute Rehab OT Goals Patient Stated Goal: get pain under control OT Goal Formulation: With patient  Plan Discharge plan remains appropriate    Co-evaluation                 End of Session Equipment Utilized During Treatment: Other (comment) (AE) CPM Right Knee CPM Right Knee: On   Activity Tolerance Patient limited by pain   Patient  Left in chair;with call bell/phone within reach   Nurse Communication          Time: 9923-4144 OT Time Calculation (min): 18 min  Charges: OT General Charges $OT Visit: 1 Procedure OT Treatments $Self Care/Home Management : 8-22 mins  Binnie Kand M.S., OTR/L Pager: [phone]  09/26/2015, 11:42 AM

## 2015-09-27 MED ORDER — APIXABAN 2.5 MG PO TABS: 2.5000 mg | ORAL_TABLET | Freq: Two times a day (BID) | ORAL | Status: DC

## 2015-09-27 MED ORDER — DOCUSATE SODIUM 100 MG PO CAPS: ORAL_CAPSULE | ORAL | Status: DC

## 2015-09-27 MED ORDER — OXYCODONE HCL 15 MG PO TABS
ORAL_TABLET | ORAL | Status: DC
Start: 2015-09-27 — End: 2015-10-16

## 2015-09-27 MED ORDER — BISACODYL 10 MG RE SUPP
10.0000 mg | Freq: Once | RECTAL | Status: AC
  Administered 2015-09-27: 10 mg via RECTAL
  Filled 2015-09-27: qty 1

## 2015-09-27 MED ORDER — POLYETHYLENE GLYCOL 3350 17 G PO PACK: PACK | ORAL | Status: DC

## 2015-09-27 NOTE — Progress Notes (Signed)
Physical Therapy Treatment Patient Details Name: April [name] MRN: [id number] DOB: 03-18-1961 Today's Date: 10/22/15    History of Present Illness Patient is a 54 y/o female admitted for R TKA.  PMH positive for GERD, back pain, anxiety and depression.    PT Comments    Pt continues to improve confidence with ambulating with cane. Gait speed was initially slow but increased once pt developed rhythm with cane. She is on track to D/C safely when medically appropriate.   Follow Up Recommendations  Supervision/Assistance - 24 hour;Outpatient PT     Equipment Recommendations  Cane    Recommendations for Other Services       Precautions / Restrictions Precautions Precautions: Fall;Knee Restrictions RLE Weight Bearing: Weight bearing as tolerated    Mobility  Bed Mobility               General bed mobility comments: Pt OOB in chair  Transfers Overall transfer level: Modified independent   Transfers: Sit to/from Stand Sit to Stand: Modified independent (Device/Increase time)         General transfer comment: increased time  Ambulation/Gait Ambulation/Gait assistance: Supervision   Assistive device: Straight cane Gait Pattern/deviations: Step-to pattern;Decreased stride length;Trunk flexed   Gait velocity interpretation: Below normal speed for age/gender General Gait Details: cues for upright posture, increased knee flexion with swing and heel strike on RLE. cues for proper cane use with gait   Stairs            Wheelchair Mobility    Modified Rankin (Stroke Patients Only)       Balance Overall balance assessment: Needs assistance   Sitting balance-Leahy Scale: Normal       Standing balance-Leahy Scale: Fair                      Cognition Arousal/Alertness: Awake/alert Behavior During Therapy: WFL for tasks assessed/performed Overall Cognitive Status: Within Functional Limits for tasks assessed                       Exercises Total Joint Exercises Ankle Circles/Pumps: AROM;Both;5 reps Heel Slides: AROM;Both;10 reps;Seated Hip ABduction/ADduction: AROM;Seated;15 reps;Both Straight Leg Raises: AAROM;Seated;Right;15 reps Long Arc Quad: AROM;Seated;Right;15 reps Marching in Standing: AROM;Seated;Both;15 reps    General Comments        Pertinent Vitals/Pain Pain Assessment: 0-10 Pain Score: 2  Pain Location: R Knee Pain Descriptors / Indicators: Aching Pain Intervention(s): Monitored during session    Home Living                      Prior Function            PT Goals (current goals can now be found in the care plan section) Progress towards PT goals: Progressing toward goals    Frequency       PT Plan Current plan remains appropriate    Co-evaluation             End of Session Equipment Utilized During Treatment: Gait belt Activity Tolerance: Patient tolerated treatment well Patient left: in chair;with call bell/phone within reach     Time: 0717-0744 PT Time Calculation (min) (ACUTE ONLY): 27 min  Charges:  $Gait Training: 8-22 mins $Therapeutic Exercise: 8-22 mins                    G CodesHaynes Bast October 22, 2015, 8:41 AM Haynes Bast, SPT 22-Oct-2015 8:41 AM

## 2015-09-27 NOTE — Progress Notes (Signed)
Occupational Therapy Treatment Patient Details Name: April [name] MRN: [id number] DOB: Nov 25, 1960 Today's Date: 09/27/2015    History of present illness Patient is a 54 y/o female admitted for R TKA.  PMH positive for GERD, back pain, anxiety and depression.   OT comments  Pt. Continues to make gains with acute OT goals.  Able to complete bed mobility and tub transfer this session.  Will continue to follow acutely.    Follow Up Recommendations  No OT follow up;Supervision/Assistance - 24 hour    Equipment Recommendations  3 in 1 bedside comode;Other (comment)    Recommendations for Other Services      Precautions / Restrictions Precautions Precautions: Fall;Knee Restrictions RLE Weight Bearing: Weight bearing as tolerated       Mobility Bed Mobility Overal bed mobility: Modified Independent Bed Mobility: Supine to Sit           General bed mobility comments: Pt OOB in chair  Transfers Overall transfer level: Modified independent Equipment used: Rolling walker (2 wheeled) Transfers: Sit to/from [facility] Sit to Stand: Modified independent (Device/Increase time) Stand pivot transfers: Modified independent (Device/Increase time)       General transfer comment: increased time    Balance Overall balance assessment: Needs assistance   Sitting balance-Leahy Scale: Normal       Standing balance-Leahy Scale: Fair                     ADL Overall ADL's : Needs assistance/impaired           Upper Body Bathing Details (indicate cue type and reason): reports she completed all bathing this am with only cna assisting with washing back   Lower Body Bathing Details (indicate cue type and reason): reports she completed lb bathing in standing this am with no safety concerns with cna present just as needed   Upper Body Dressing Details (indicate cue type and reason): reports she completed without assist this am   Lower Body Dressing  Details (indicate cue type and reason): reports she completed without assist this am  Toilet Transfer: Supervision/safety;RW Toilet Transfer Details (indicate cue type and reason): simulated during tub transfer in b.room Toileting- Clothing Manipulation and Hygiene: Supervision/safety;Sit to/from stand Toileting - Clothing Manipulation Details (indicate cue type and reason): simulated during transfers in b.room Tub/ Shower Transfer: Tub transfer;3 in 1;Ambulation;Min guard;Rolling walker Tub/Shower Transfer Details (indicate cue type and reason): pt. unable to complete tub transfer with side stepping, introduced method with 2 legs of 3n1 in the tub and 2 legs out of the tub. pt. able to back up to 3n1 sit and bring each leg into the tub without physical assist Functional mobility during ADLs: Min guard;Rolling walker General ADL Comments: making great gains with skilled OT      Vision                     Perception     Praxis      Cognition   Behavior During Therapy: [facility] for tasks assessed/performed Overall Cognitive Status: Within Functional Limits for tasks assessed                       Extremity/Trunk Assessment               Exercises Total Joint Exercises Ankle Circles/Pumps: AROM;Both;5 reps Heel Slides: AROM;Both;10 reps;Seated Hip ABduction/ADduction: AROM;Seated;15 reps;Both Straight Leg Raises: AAROM;Seated;Right;15 reps Long Arc Quad: AROM;Seated;Right;15 reps Marching in Standing: AROM;Seated;Both;15  reps   Shoulder Instructions       General Comments      Pertinent Vitals/ Pain       Pain Assessment: No/denies pain Pain Score: 2  Pain Location: R Knee Pain Descriptors / Indicators: Aching Pain Intervention(s): Monitored during session  Home Living                                          Prior Functioning/Environment              Frequency Min 2X/week     Progress Toward Goals  OT Goals(current goals  can now be found in the care plan section)  Progress towards OT goals: Progressing toward goals     Plan Discharge plan remains appropriate    Co-evaluation                 End of Session Equipment Utilized During Treatment: Gait belt;Rolling walker CPM Right Knee CPM Right Knee: Off   Activity Tolerance Patient tolerated treatment well   Patient Left in bed;with call bell/phone within reach   Nurse Communication          Time: [id number] OT Time Calculation (min): 14 min  Charges: OT General Charges $OT Visit: 1 Procedure OT Treatments $Self Care/Home Management : 8-22 mins  Janice Coffin, COTA/L 09/27/2015, 9:57 AM
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
## 2015-09-30 ENCOUNTER — Ambulatory Visit: Payer: Medicaid Other | Attending: Orthopedic Surgery | Admitting: Physical Therapy

## 2015-09-30 DIAGNOSIS — M25561 Pain in right knee: Secondary | ICD-10-CM | POA: Insufficient documentation

## 2015-09-30 DIAGNOSIS — R269 Unspecified abnormalities of gait and mobility: Secondary | ICD-10-CM | POA: Insufficient documentation

## 2015-09-30 DIAGNOSIS — R29898 Other symptoms and signs involving the musculoskeletal system: Secondary | ICD-10-CM | POA: Insufficient documentation

## 2015-09-30 DIAGNOSIS — R600 Localized edema: Secondary | ICD-10-CM | POA: Insufficient documentation
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
## 2015-09-30 NOTE — Patient Instructions (Signed)
   Kristoffer Leamon PT, DPT, LAT, ATC  [facility] Phone: [phone]     

## 2015-10-04 ENCOUNTER — Other Ambulatory Visit (HOSPITAL_COMMUNITY): Payer: Self-pay | Admitting: Orthopedic Surgery

## 2015-10-04 ENCOUNTER — Ambulatory Visit (HOSPITAL_COMMUNITY)
Admission: RE | Admit: 2015-10-04 | Discharge: 2015-10-04 | Disposition: A | Discharge status: Home / Self Care | Payer: Medicaid Other | Source: Ambulatory Visit | Attending: Cardiovascular Disease | Admitting: Cardiovascular Disease

## 2015-10-04 DIAGNOSIS — M7989 Other specified soft tissue disorders: Secondary | ICD-10-CM | POA: Insufficient documentation

## 2015-10-04 DIAGNOSIS — R52 Pain, unspecified: Secondary | ICD-10-CM

## 2015-10-04 DIAGNOSIS — M79604 Pain in right leg: Secondary | ICD-10-CM | POA: Diagnosis not present

## 2015-10-04 DIAGNOSIS — E785 Hyperlipidemia, unspecified: Secondary | ICD-10-CM | POA: Diagnosis not present

## 2015-10-15 ENCOUNTER — Encounter (HOSPITAL_COMMUNITY): Payer: Self-pay | Admitting: Emergency Medicine

## 2015-10-15 ENCOUNTER — Observation Stay (HOSPITAL_COMMUNITY)
Admission: EM | Admit: 2015-10-15 | Discharge: 2015-10-16 | Disposition: A | Discharge status: Home / Self Care | Payer: Medicaid Other | Attending: Family Medicine | Admitting: Family Medicine

## 2015-10-15 DIAGNOSIS — M25569 Pain in unspecified knee: Secondary | ICD-10-CM | POA: Diagnosis not present

## 2015-10-15 DIAGNOSIS — K5900 Constipation, unspecified: Secondary | ICD-10-CM | POA: Diagnosis not present

## 2015-10-15 DIAGNOSIS — M25561 Pain in right knee: Secondary | ICD-10-CM

## 2015-10-15 DIAGNOSIS — T783XXD Angioneurotic edema, subsequent encounter: Secondary | ICD-10-CM | POA: Diagnosis not present

## 2015-10-15 DIAGNOSIS — T783XXA Angioneurotic edema, initial encounter: Principal | ICD-10-CM

## 2015-10-15 DIAGNOSIS — Z79899 Other long term (current) drug therapy: Secondary | ICD-10-CM | POA: Insufficient documentation

## 2015-10-15 DIAGNOSIS — Z87891 Personal history of nicotine dependence: Secondary | ICD-10-CM | POA: Diagnosis not present

## 2015-10-15 DIAGNOSIS — E785 Hyperlipidemia, unspecified: Secondary | ICD-10-CM | POA: Diagnosis not present

## 2015-10-15 DIAGNOSIS — Z7901 Long term (current) use of anticoagulants: Secondary | ICD-10-CM | POA: Diagnosis not present

## 2015-10-15 DIAGNOSIS — Z96651 Presence of right artificial knee joint: Secondary | ICD-10-CM | POA: Diagnosis not present

## 2015-10-15 DIAGNOSIS — G8929 Other chronic pain: Secondary | ICD-10-CM | POA: Insufficient documentation

## 2015-10-15 DIAGNOSIS — Z6841 Body Mass Index (BMI) 40.0 and over, adult: Secondary | ICD-10-CM | POA: Diagnosis not present

## 2015-10-15 DIAGNOSIS — L509 Urticaria, unspecified: Secondary | ICD-10-CM | POA: Insufficient documentation

## 2015-10-15 LAB — BASIC METABOLIC PANEL
Anion gap: 9 (ref 5–15)
BUN: 14 mg/dL (ref 6–20)
CHLORIDE: 106 mmol/L (ref 101–111)
CO2: 25 mmol/L (ref 22–32)
CREATININE: 0.63 mg/dL (ref 0.44–1.00)
Calcium: 9 mg/dL (ref 8.9–10.3)
GFR calc Af Amer: >60 mL/min (ref >60)
GFR calc non Af Amer: >60 mL/min (ref >60)
GLUCOSE: 97 mg/dL (ref 65–99)
Potassium: 4.3 mmol/L (ref 3.5–5.1)
Sodium: 140 mmol/L (ref 135–145)

## 2015-10-15 LAB — CBC
HEMATOCRIT: 34.4 % — AB (ref 36.0–46.0)
HEMOGLOBIN: 10.9 g/dL — AB (ref 12.0–15.0)
MCH: 27.3 pg (ref 26.0–34.0)
MCHC: 31.7 g/dL (ref 30.0–36.0)
MCV: 86 fL (ref 78.0–100.0)
Platelets: 400 10*3/uL (ref 150–400)
RBC: 4 MIL/uL (ref 3.87–5.11)
RDW: 15.7 % — ABNORMAL HIGH (ref 11.5–15.5)
WBC: 6.7 10*3/uL (ref 4.0–10.5)

## 2015-10-15 MED ORDER — ONDANSETRON HCL 4 MG/2ML IJ SOLN
4.0000 mg | Freq: Once | INTRAMUSCULAR | Status: AC
  Administered 2015-10-15: 4 mg via INTRAVENOUS
  Filled 2015-10-15: qty 2

## 2015-10-15 MED ORDER — SODIUM CHLORIDE 0.9 % IV BOLUS (SEPSIS)
1000.0000 mL | Freq: Once | INTRAVENOUS | Status: AC
  Administered 2015-10-15: 1000 mL via INTRAVENOUS

## 2015-10-15 MED ORDER — FAMOTIDINE IN NACL 20-0.9 MG/50ML-% IV SOLN
20.0000 mg | Freq: Once | INTRAVENOUS | Status: DC
  Filled 2015-10-15: qty 50

## 2015-10-15 MED ORDER — FAMOTIDINE IN NACL 20-0.9 MG/50ML-% IV SOLN
20.0000 mg | Freq: Once | INTRAVENOUS | Status: AC
  Administered 2015-10-15: 20 mg via INTRAVENOUS
  Filled 2015-10-15: qty 50

## 2015-10-15 MED ORDER — DIPHENHYDRAMINE HCL 50 MG/ML IJ SOLN
25.0000 mg | Freq: Once | INTRAMUSCULAR | Status: AC
  Administered 2015-10-15: 25 mg via INTRAVENOUS
  Filled 2015-10-15: qty 1

## 2015-10-15 MED ORDER — OXYCODONE HCL 5 MG PO TABS
10.0000 mg | ORAL_TABLET | Freq: Once | ORAL | Status: AC
  Administered 2015-10-15: 10 mg via ORAL
  Filled 2015-10-15: qty 2

## 2015-10-15 MED ORDER — DIPHENHYDRAMINE HCL 50 MG/ML IJ SOLN
25.0000 mg | Freq: Once | INTRAMUSCULAR | Status: DC
  Filled 2015-10-15: qty 1

## 2015-10-15 MED ORDER — SODIUM CHLORIDE 0.9 % IV SOLN
INTRAVENOUS | Status: DC
  Administered 2015-10-15: 23:00:00 via INTRAVENOUS

## 2015-10-15 MED ORDER — METHYLPREDNISOLONE SODIUM SUCC 125 MG IJ SOLR
125.0000 mg | Freq: Once | INTRAMUSCULAR | Status: DC
  Filled 2015-10-15: qty 2

## 2015-10-15 MED ORDER — METHYLPREDNISOLONE SODIUM SUCC 125 MG IJ SOLR
125.0000 mg | Freq: Once | INTRAMUSCULAR | Status: AC
  Administered 2015-10-15: 125 mg via INTRAVENOUS
  Filled 2015-10-15: qty 2

## 2015-10-15 MED ORDER — SODIUM CHLORIDE 0.9 % IV SOLN: 1000.0000 mL | Freq: Once | INTRAVENOUS | Status: DC

## 2015-10-15 MED ORDER — EPINEPHRINE 0.3 MG/0.3ML IJ SOAJ
0.3000 mg | Freq: Once | INTRAMUSCULAR | Status: DC
  Filled 2015-10-15: qty 0.3

## 2015-10-15 NOTE — ED Notes (Signed)
Swelling unchanged in the upper lip; swelling now noted to the left inner eye by daughter; swelling to hands has decreased; pt denies SOB, throat irritation, or stridor

## 2015-10-15 NOTE — ED Provider Notes (Signed)
  Face-to-face evaluation   History: She presents for evaluation, swelling, and upper lip, which started today. She is also noticed some swelling in her right hand. She denies rash. He has felt somewhat weak today. She had right knee replacement, and was discharged from the hospital 09/28/2015. She was started on oxycodone with Tylenol, 10/09/2015. Prior to that she had been on oxycodone for pain.  Physical exam: Obese, alert, cooperative. Mild upper lip angioedema. No lingual, sublingual or posterior pharyngeal edema. No stridor. Lungs clear anteriorly.  Medical screening examination/treatment/procedure(s) were conducted as a shared visit with non-physician practitioner(s) and myself.  I personally evaluated the patient during the encounter  Daleen Bo, MD 10/16/15 1250
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
## 2015-10-15 NOTE — ED Notes (Signed)
Pt sts woke up this am with left side of lip swollen and left hand swelling; no distress noted
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
## 2015-10-16 ENCOUNTER — Encounter (HOSPITAL_COMMUNITY): Payer: Self-pay

## 2015-10-16 ENCOUNTER — Ambulatory Visit: Payer: Medicaid Other | Admitting: Physical Therapy

## 2015-10-16 DIAGNOSIS — M25561 Pain in right knee: Secondary | ICD-10-CM | POA: Diagnosis not present

## 2015-10-16 DIAGNOSIS — T783XXD Angioneurotic edema, subsequent encounter: Secondary | ICD-10-CM

## 2015-10-16 DIAGNOSIS — L509 Urticaria, unspecified: Secondary | ICD-10-CM | POA: Insufficient documentation

## 2015-10-16 LAB — C-REACTIVE PROTEIN: CRP: 2.4 mg/dL — AB (ref <1.0)

## 2015-10-16 LAB — CBC
HCT: 36.3 % (ref 36.0–46.0)
Hemoglobin: 11.3 g/dL — ABNORMAL LOW (ref 12.0–15.0)
MCH: 26.5 pg (ref 26.0–34.0)
MCHC: 31.1 g/dL (ref 30.0–36.0)
MCV: 85 fL (ref 78.0–100.0)
PLATELETS: 476 10*3/uL — AB (ref 150–400)
RBC: 4.27 MIL/uL (ref 3.87–5.11)
RDW: 15.8 % — ABNORMAL HIGH (ref 11.5–15.5)
WBC: 6.1 10*3/uL (ref 4.0–10.5)

## 2015-10-16 LAB — COMPREHENSIVE METABOLIC PANEL
ALT: 14 U/L (ref 14–54)
AST: 20 U/L (ref 15–41)
Albumin: 3 g/dL — ABNORMAL LOW (ref 3.5–5.0)
Alkaline Phosphatase: 115 U/L (ref 38–126)
Anion gap: 9 (ref 5–15)
BUN: 10 mg/dL (ref 6–20)
CHLORIDE: 108 mmol/L (ref 101–111)
CO2: 24 mmol/L (ref 22–32)
CREATININE: 0.5 mg/dL (ref 0.44–1.00)
Calcium: 9.1 mg/dL (ref 8.9–10.3)
Glucose, Bld: 141 mg/dL — ABNORMAL HIGH (ref 65–99)
POTASSIUM: 4.4 mmol/L (ref 3.5–5.1)
Sodium: 141 mmol/L (ref 135–145)
TOTAL PROTEIN: 7.7 g/dL (ref 6.5–8.1)
Total Bilirubin: 0.5 mg/dL (ref 0.3–1.2)

## 2015-10-16 LAB — SEDIMENTATION RATE: SED RATE: 45 mm/h — AB (ref 0–22)

## 2015-10-16 MED ORDER — TRAMADOL HCL 50 MG PO TABS: 50.0000 mg | ORAL_TABLET | Freq: Four times a day (QID) | ORAL | Status: DC | PRN

## 2015-10-16 MED ORDER — DIPHENHYDRAMINE HCL 25 MG PO CAPS: 25.0000 mg | ORAL_CAPSULE | ORAL | Status: DC | PRN

## 2015-10-16 MED ORDER — HYDROCODONE-ACETAMINOPHEN 10-325 MG PO TABS
1.0000 | ORAL_TABLET | ORAL | Status: DC
  Administered 2015-10-16 (×3): 1 via ORAL
  Filled 2015-10-16 (×3): qty 1

## 2015-10-16 MED ORDER — TRAMADOL HCL 50 MG PO TABS
50.0000 mg | ORAL_TABLET | Freq: Four times a day (QID) | ORAL | Status: DC | PRN
  Administered 2015-10-16: 50 mg via ORAL
  Filled 2015-10-16: qty 1

## 2015-10-16 MED ORDER — PREDNISONE 20 MG PO TABS
40.0000 mg | ORAL_TABLET | Freq: Every day | ORAL | Status: DC
  Administered 2015-10-16: 40 mg via ORAL
  Filled 2015-10-16: qty 2

## 2015-10-16 MED ORDER — SODIUM CHLORIDE 0.9 % IV SOLN
INTRAVENOUS | Status: DC
  Administered 2015-10-16: 01:00:00 via INTRAVENOUS

## 2015-10-16 MED ORDER — PREDNISONE 20 MG PO TABS: 40.0000 mg | ORAL_TABLET | Freq: Every day | ORAL | Status: DC

## 2015-10-16 MED ORDER — DIPHENHYDRAMINE HCL 25 MG PO CAPS
50.0000 mg | ORAL_CAPSULE | Freq: Once | ORAL | Status: AC
  Administered 2015-10-16: 50 mg via ORAL
  Filled 2015-10-16: qty 2

## 2015-10-16 MED ORDER — APIXABAN 2.5 MG PO TABS
2.5000 mg | ORAL_TABLET | Freq: Two times a day (BID) | ORAL | Status: DC
  Filled 2015-10-16: qty 1

## 2015-10-16 MED ORDER — DOCUSATE SODIUM 100 MG PO CAPS
100.0000 mg | ORAL_CAPSULE | Freq: Two times a day (BID) | ORAL | Status: DC
  Administered 2015-10-16 (×2): 100 mg via ORAL
  Filled 2015-10-16 (×2): qty 1

## 2015-10-16 MED ORDER — POLYETHYLENE GLYCOL 3350 17 G PO PACK: 17.0000 g | PACK | Freq: Every day | ORAL | Status: DC | PRN

## 2015-10-16 NOTE — Discharge Summary (Signed)
[facility] Discharge Summary  Patient name: April [name] Medical record number: [id number] Date of birth: Nov 03, 1960 Age: 55 y.o. Gender: female Date of Admission: 10/15/2015  Date of Discharge: 10/15/14 Admitting Physician: Lupita Dawn, MD  Primary Care Provider: [name], MD Consultants: None  Indication for Hospitalization: Angioedema  Discharge Diagnoses/Problem List:  Patient Active Problem List   Diagnosis Date Noted  . Urticaria   . Knee pain, right   . Angioedema 10/15/2015  . DJD (degenerative joint disease) of knee 09/23/2015  . Primary localized osteoarthritis of right knee 09/11/2015  . 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 Menopause 12/11/2014  . Osteoarthritis of right knee 12/01/2012  . Healthcare maintenance 12/01/2012  . Low back pain 01/27/2012  . Leg cramps 11/28/2010  . GERD 03/22/2009  . Anxiety state 11/14/2008  . Eczema 05/18/2008  . Depression 03/10/2007  . HYPERCHOLESTEROLEMIA 12/09/2006  . Morbid obesity ([place]) 12/09/2006  . Anemia 12/09/2006  . HEMORRHOIDS, NOS 12/09/2006   Disposition: home  Discharge Condition: improved  Discharge Exam: see previous progress note  Brief Hospital Course:  April [name] is a 55 y.o. female who presented with angioedema x 1 day and hand itchiness since starting Oxycodone IR after right knee arthroplasty on 09/23/15. PMH is significant for right knee osteoarthritis, cholinergic urticaria, anxiety, depression, GERD and eczema.   Angioedema:  Patient initially presented with angioedema localized to lips (L>R side). No uticaria, wheezing, or airway compromise. Symptoms likely from allergy to new medications that were given after R knee arthroplasty on 09/23/15. Oxycodone IR was started after the surgery on 12/12 and she was switched to Percocet 5 days prior to admission. Of note, patient was also given Ancef during surgery. Benadryl 32m IV given in ED and then PO 25 mg q4  PRN. Solumedrol given in ED and was then started on Prednisone 40 mg daily for 5 day course. CBC, CMP, ESR, CRP, C4 complement protein labs were drawn (see results below). Patient was observed overnight and angioedema improved.    S/p R knee arthroplasty w/ hx of osteoarthritis:  Had surgery on 09/23/15 and continues to have right knee pain from surgery. Percocet was discontinued on admission as it was the likely cause of angioedema. NORCO 10 mg- 325 1 tab q4 scheduled for pain overnight. Her bowel regimen of Colace was also continued. Prior to discharge, patient was sent home on Tramadol 50 mg q6 PRN for pain.   Issues for Follow Up:  1. Angioedema; ensure patient's upper lip swelling has resolved and no new symptoms. Percocet was added to allergy list.  2. C4 compliment protein result pending at discharge, ensure protein not elevated 3. Pain management; upon discharge was transitioned to Tramadol 50 mg q 6 PRN for post op pain. May need to follow up with orthopedic surgeon.  Significant Procedures: None  Significant Labs and Imaging:   Recent Labs Lab 10/15/15 2124 10/16/15 0452  WBC 6.7 6.1  HGB 10.9* 11.3*  HCT 34.4* 36.3  PLT 400 476*    Recent Labs Lab 10/15/15 2124 10/16/15 0452  NA 140 141  K 4.3 4.4  CL 106 108  CO2 25 24  GLUCOSE 97 141*  BUN 14 10  CREATININE 0.63 0.50  CALCIUM 9.0 9.1  ALKPHOS  --  115  AST  --  20  ALT  --  14  ALBUMIN  --  3.0*   ESR: 45 CRP:2.4  Results/Tests Pending at Time of Discharge: C4 complement protein  Discharge Medications:    Medication List    STOP taking these medications        apixaban 2.5 MG Tabs tablet  Commonly known as:  ELIQUIS     oxyCODONE 15 MG immediate release tablet  Commonly known as:  ROXICODONE     oxyCODONE-acetaminophen 10-325 MG tablet  Commonly known as:  PERCOCET      TAKE these medications        acetaminophen 650 MG CR tablet  Commonly known as:  TYLENOL 8 HOUR  Take 1 tablet (650 mg  total) by mouth every 8 (eight) hours as needed for pain.     clobetasol ointment 0.05 %  Commonly known as:  TEMOVATE  Apply 1 application topically 2 (two) times daily.     diphenhydrAMINE 25 mg capsule  Commonly known as:  BENADRYL  Take 1 capsule (25 mg total) by mouth every 4 (four) hours as needed for itching. x4 days     docusate sodium 100 MG capsule  Commonly known as:  COLACE  1 tab 2 times a day while on narcotics.  STOOL SOFTENER     polyethylene glycol packet  Commonly known as:  MIRALAX / GLYCOLAX  17grams in 16 oz of water twice a day until regular bowel movements.  LAXITIVE.  Restart if two days since last bowel movement     predniSONE 20 MG tablet  Commonly known as:  DELTASONE  Take 2 tablets (40 mg total) by mouth daily with breakfast. x4 days (start 10/17/15)  Start taking on:  10/17/2015     traMADol 50 MG tablet  Commonly known as:  ULTRAM  Take 1 tablet (50 mg total) by mouth every 6 (six) hours as needed for moderate pain.        Discharge Instructions: Please refer to Patient Instructions section of EMR for full details.  Patient was counseled important signs and symptoms that should prompt return to medical care, changes in medications, dietary instructions, activity restrictions, and follow up appointments.   Follow-Up Appointments:   Follow-up Information    Follow up with Georges Lynch, MD. Go on 10/18/2015.   Specialty:  Family Medicine   Why:  hospital follow up at 2:45 PM   Contact information:   [address] [place] [address] [place], MD 10/16/2015, 11:17 AM PGY-1, [place]

## 2015-10-16 NOTE — Care Management Note (Signed)
Case Management Note  Patient Details  Name: April [name] MRN: [id number] Date of Birth: 1960/11/04  Subjective/Objective:                  Independent patient from home with daughter, placed in OBS for edema. Patient is post op R knee replacement. Patient states she normally drives and her daugter is driver her until she has clearence from ortho. She has a cane and a walker and denies needing any other DME. She states she can afford her meds. She asked about compression stockings. CM suggested that she have her legs measured after her edema is resolved at her PCP office to get an accurate measurement and they can Rx her compression stockings, but suggested to take home ones she has on now to wear in the meantime.   Action/Plan:  DC to home, self care. No CM needs identified.   Expected Discharge Date:                  Expected Discharge Plan:  Home/Self Care  In-House Referral:     Discharge planning Services  CM Consult  Post Acute Care Choice:    Choice offered to:     DME Arranged:    DME Agency:     HH Arranged:    [place] Agency:     Status of Service:  Completed, signed off  Medicare Important Message Given:    Date Medicare IM Given:    Medicare IM give by:    Date Additional Medicare IM Given:    Additional Medicare Important Message give by:     If discussed at [place] of Stay Meetings, dates discussed:    Additional Comments:  Carles Collet, RN 10/16/2015, 11:26 AM

## 2015-10-16 NOTE — Discharge Instructions (Signed)
You were admitted for angioedema (swelling).  You were treated with steroids and benadryl.  You should continue these medications for the next 4 days.  A prescription has been sent in for you.  Please follow up in the [facility] as scheduled.  GET HELP RIGHT AWAY IF:   Your mouth, tongue, or lips are very puffy.  You have trouble breathing.  You have trouble swallowing.  You pass out (faint).  Angioedema Angioedema is sudden puffiness (swelling), often of the skin. It can happen:  On your face or privates (genitals).  In your belly (abdomen) or other body parts. It usually happens quickly and gets better in 1 or 2 days. It often starts at night and is found when you wake up. You may get red, itchy patches of skin (hives). Attacks can be dangerous if your breathing passages get puffy. The condition may happen only once, or it can come back at random times. It may happen for several years before it goes away for good. HOME CARE  Only take medicines as told by your doctor.  Always carry your emergency allergy medicines with you.  Wear a medical bracelet as told by your doctor.  Avoid things that you know will cause attacks (triggers). GET HELP IF:  You have another attack.  Your attacks happen more often or get worse.  The condition was passed to you by your parents and you want to have children. MAKE SURE YOU:   Understand these instructions.  Will watch your condition.  Will get help right away if you are not doing well or get worse.   This information is not intended to replace advice given to you by your health care provider. Make sure you discuss any questions you have with your health care provider.   Document Released: 09/16/2009 Document Revised: 07/19/2013 Document Reviewed: 05/22/2013 Elsevier Interactive Patient Education [employer].

## 2015-10-16 NOTE — Progress Notes (Signed)
Family Medicine Teaching Service Daily Progress Note Intern Pager: [phone]  Patient name: April [name] Medical record number: [id number] Date of birth: [birth year] Age: 55 y.o. Gender: female  Primary Care Provider: [name], MD Consultants: None Code Status: Full (per discussion on admission)  Pt Overview and Major Events to Date:  01/03: Admitted for observation  Assessment and Plan: April [name] is a 54 y.o. female presenting with angioedema x 1 day and hand itchiness since starting Oxycodone IR after right knee arthroplasty on 09/23/15. PMH is significant for right knee osteoarthritis, cholinergic urticaria, anxiety, depression, GERD and eczema.   Angioedema: Localized to lips (L>R side) and associated with bilateral hand swelling/itchiness. No rash, wheezing, or airway compromise. Possible causes include allergic reaction, undiagnosed hereditary angioedema, acquired angioedema, or idiopathic. Symptoms likely from allergy to new medications that were given after R knee arthroplasty on 09/23/15. Oxycodone IR was started after the surgery on 12/12 and she was switched to Percocet 5 days ago. Of note, patient was also given Ancef during surgery. Not likely undiagnosed hereditary angioedema as it would likely have initially presented earlier in life. Could be acquired angioedema but unlikely as timeline of patient's symptoms align with new medication administration. Improving angioedema this AM - s/p Solumedrol in ED. Prednisone 40 mg daily starting today, taper 5-7 days after discharge - Benadryl 25 mg q4 PRN  - Labs: CBC, CMP, ESR, CRP, C4 complement protein  - CRP elevated at 2.4 (normal <1), ESR 45 (normal 0-22)   S/p R knee arthroplasty w/ hx of osteoarthritis: Had surgery on 09/23/15. She was kept in the hospital for leukocytosis and decreased mobility but was discharged on 09/28/15 and followed up with Dr. Noemi Chapel on 10/08/15.  - Discontinued Percocet as it is  likely cause of angioedema. Continue NORCO 10 mg- 325 1 tab q4 scheduled for pain - On Colace to prevent constipation while on opioid pain medication - Holding Eliquis for now, patient states that it was held by orthopedic surgeon when she saw him on the 27th.  - Up in bed with assistance   FEN/GI: KVO NS, heart healthy carb modified diet Prophylaxis: Eliquis  Disposition: Home pending improvement of angioedema  Subjective:  - Some complaints of R knee pain this morning - Lip swelling has improved - Denies hand itchiness - No new rashes - No shortness of breath or trouble breathing  Objective: Temp:  [98.2 F (36.8 C)-98.8 F (37.1 C)] 98.5 F (36.9 C) (01/04 0556) Pulse Rate:  [72-86] 81 (01/04 0556) Resp:  [16-26] 16 (01/04 0556) BP: (92-131)/(47-80) 121/68 mmHg (01/04 0556) SpO2:  [94 %-100 %] 94 % (01/04 0556) Weight:  [258 lb 14.4 oz (117.436 kg)] 258 lb 14.4 oz (117.436 kg) (01/04 0035) Physical Exam: General: In NAD, sitting up in chair ENTM: moist mucous membranes, no tongue swelling. Decreased swelling noted on upper lip. No erythema, ecchymosis, or fluctuance. Tongue noteably normal, and no airway narrowing. No LAD regular rate and rhythm, normal s1 and s2. No murmurs Respiratory: normal work of breathing, clear to auscultation bilaterally, no wheezes Abdomen: soft, non tender, non distended, normal bowel sounds Extremities: Healing incision on right knee, no erythema or swelling. No edema in extremities  Laboratory:  Recent Labs Lab 10/15/15 2124 10/16/15 0452  WBC 6.7 6.1  HGB 10.9* 11.3*  HCT 34.4* 36.3  PLT 400 476*    Recent Labs Lab 10/15/15 2124  NA 140  K 4.3  CL 106  CO2 25  BUN 14  CREATININE 0.63  CALCIUM 9.0  GLUCOSE 97    Imaging/Diagnostic Tests: No results found.   Carlyle Dolly, MD 10/16/2015, 6:16 AM PGY-1, [place] Intern pager: [phone], text pages welcome

## 2015-10-16 NOTE — Progress Notes (Signed)
Nsg Discharge Note  Admit Date:  10/15/2015 Discharge date: 10/16/2015   [name] to be D/C'd home per MD order. Home medication that were locked up in pharmacy was returned to patient. Pt signed form acknowledging medication was returned to her. AVS completed.  Copy for chart, and copy for patient signed, and dated. Patient/family able to verbalize understanding.  Discharge Medication:   Medication List    STOP taking these medications        apixaban 2.5 MG Tabs tablet  Commonly known as:  ELIQUIS     oxyCODONE 15 MG immediate release tablet  Commonly known as:  ROXICODONE     oxyCODONE-acetaminophen 10-325 MG tablet  Commonly known as:  PERCOCET      TAKE these medications        acetaminophen 650 MG CR tablet  Commonly known as:  TYLENOL 8 HOUR  Take 1 tablet (650 mg total) by mouth every 8 (eight) hours as needed for pain.     clobetasol ointment 0.05 %  Commonly known as:  TEMOVATE  Apply 1 application topically 2 (two) times daily.     diphenhydrAMINE 25 mg capsule  Commonly known as:  BENADRYL  Take 1 capsule (25 mg total) by mouth every 4 (four) hours as needed for itching. x4 days     docusate sodium 100 MG capsule  Commonly known as:  COLACE  1 tab 2 times a day while on narcotics.  STOOL SOFTENER     polyethylene glycol packet  Commonly known as:  MIRALAX / GLYCOLAX  17grams in 16 oz of water twice a day until regular bowel movements.  LAXITIVE.  Restart if two days since last bowel movement     predniSONE 20 MG tablet  Commonly known as:  DELTASONE  Take 2 tablets (40 mg total) by mouth daily with breakfast. x4 days (start 10/17/15)  Start taking on:  10/17/2015     traMADol 50 MG tablet  Commonly known as:  ULTRAM  Take 1 tablet (50 mg total) by mouth every 6 (six) hours as needed for moderate pain.        Discharge Assessment: Filed Vitals:   10/16/15 0556 10/16/15 1351  BP: 121/68 101/40  Pulse: 81 91  Temp: 98.5 F (36.9 C) 98 F (36.7  C)  Resp: 16 23   Skin clean, dry and intact without evidence of skin break down, no evidence of skin tears noted. IV catheter discontinued with catheter tip intact. Site without signs and symptoms of complications - no redness or edema noted at insertion site, patient denies c/o pain - only slight tenderness at site.  Dressing with slight pressure applied.  D/c Instructions-Education: Discharge instructions given to patient/family with verbalized understanding. D/c education completed with patient/family including follow up instructions, medication list, d/c activities limitations if indicated, with other d/c instructions as indicated by MD - patient able to verbalize understanding, all questions fully answered. Patient instructed to return to ED, call 911, or call MD for any changes in condition.  Patient getting dressed at this time, family in room helping pt. Pt is to call when ready for wheelchair to taker her to emergency entrance.   Dorita Fray, RN 10/16/2015 4:33 PM

## 2015-10-17 LAB — C4 COMPLEMENT: Complement C4, Body Fluid: 16 mg/dL (ref 14–44)

## 2015-10-18 ENCOUNTER — Ambulatory Visit (INDEPENDENT_AMBULATORY_CARE_PROVIDER_SITE_OTHER): Payer: Medicaid Other | Admitting: Family Medicine

## 2015-10-18 VITALS — BP 138/76 | HR 91 | Temp 98.2°F | Ht 61.0 in | Wt 257.3 lb

## 2015-10-18 DIAGNOSIS — T783XXD Angioneurotic edema, subsequent encounter: Secondary | ICD-10-CM

## 2015-10-18 DIAGNOSIS — M25561 Pain in right knee: Secondary | ICD-10-CM

## 2015-10-18 DIAGNOSIS — T783XXA Angioneurotic edema, initial encounter: Secondary | ICD-10-CM | POA: Diagnosis not present

## 2015-10-18 MED ORDER — HYDROCODONE-ACETAMINOPHEN 10-325 MG PO TABS: 1.0000 | ORAL_TABLET | Freq: Three times a day (TID) | ORAL | Status: DC | PRN

## 2015-10-18 NOTE — Patient Instructions (Signed)
It was a pleasure seeing you today in our clinic. Today we discussed your hospital stay. Here is the treatment plan we have discussed and agreed upon together:   -Stop taking the Tramadol. Begin taking the Norco for pain, every 8 hours only if necessary to control the pain. - I would not plan on having this medication as a long-term source of pain control. This new prescription is only to keep you comfortable until your appointment with your surgeon.  - Continue taking the other medications you were prescribed as they were prescribed.

## 2015-10-18 NOTE — Assessment & Plan Note (Signed)
Patient is s/p right knee arthroplasty. Has persistent pain. She was provided tramadol on discharge due to an allergic reaction from her Percocet. Patient states that her tramadol has not been helping her pain and she is very uncomfortable and losing sleep due to the pain in her knee. Patient reports that she has an appointment with her orthopedist on Monday 10/21/15. - Norco 10/325 mg; patient was provided this during her admission to the hospital in place of Percocet. She had no significant reaction to this medication.  - Patient was only prescribed 12 tablets and advised to take these every 8 hours when necessary. This is only enough to get her to her appointment with her orthopedic surgeon. I advised her that her orthopedic surgeon should be in charge of her pain control secondary to this surgery. I also advised her that she would need to begin weaning herself off of narcotic pain medications as they are not suitable for long-term pain control. Patient stated her understanding.

## 2015-10-18 NOTE — Assessment & Plan Note (Signed)
Signs and symptoms of angioedema are no longer present. Patient states that the majority of the swelling has resolved however she still states that her upper lip feels slightly swollen although it no longer looks this way even to her. Patient is very pleased with how she was treated in the hospital and with her treatment regimen after discharge. She states good compliance with these medications. - Advised to continue taking prednisone until completion of the prescription. - Advised to take Benadryl as needed for any persistent swelling.

## 2015-10-18 NOTE — Progress Notes (Signed)
   HPI  CC: hospital f/u Patient is experiencing increased pain from her surgery. The new pain medication is NOT working to control her pain (Ultram). Has been taking her medications as prescribed. Swelling is significantly down, almost resolved at this time. No fevers, nausea, vomiting, or diarrhea.   ROS: Patient denies any fevers, headaches, shortness of breath, blurry vision, neck stiffness, dysphagia, sore throat, chest pain, abdominal pain, nausea, diarrhea, constipation, weakness, numbness, paresthesias, or new edema/swelling.  Past medical history and social history reviewed and updated in the EMR as appropriate.  Objective: BP 138/76 mmHg  Pulse 91  Temp(Src) 98.2 F (36.8 C) (Oral)  Ht 5\' 1"  (1.549 m)  Wt 257 lb 4.8 oz (116.711 kg)  BMI 48.64 kg/m2 Gen: NAD, alert, cooperative, and pleasant. HEENT: NCAT, EOMI, PERRL, no swelling present of the lips or face. Neck FROM, TMs clear bilaterally. OP clear without exudate. Nasal mucosa without edema. CV: RRR, no murmur Resp: CTAB, no wheezes, non-labored Ext: No edema, warm Neuro: Alert and oriented, Speech clear, No gross deficits  Assessment and plan:  Angioedema Signs and symptoms of angioedema are no longer present. Patient states that the majority of the swelling has resolved however she still states that her upper lip feels slightly swollen although it no longer looks this way even to her. Patient is very pleased with how she was treated in the hospital and with her treatment regimen after discharge. She states good compliance with these medications. - Advised to continue taking prednisone until completion of the prescription. - Advised to take Benadryl as needed for any persistent swelling.  Knee pain, right Patient is s/p right knee arthroplasty. Has persistent pain. She was provided tramadol on discharge due to an allergic reaction from her Percocet. Patient states that her tramadol has not been helping her pain and she  is very uncomfortable and losing sleep due to the pain in her knee. Patient reports that she has an appointment with her orthopedist on Monday 10/21/15. - Norco 10/325 mg; patient was provided this during her admission to the hospital in place of Percocet. She had no significant reaction to this medication.  - Patient was only prescribed 12 tablets and advised to take these every 8 hours when necessary. This is only enough to get her to her appointment with her orthopedic surgeon. I advised her that her orthopedic surgeon should be in charge of her pain control secondary to this surgery. I also advised her that she would need to begin weaning herself off of narcotic pain medications as they are not suitable for long-term pain control. Patient stated her understanding.    Meds ordered this encounter  Medications  . HYDROcodone-acetaminophen (NORCO) 10-325 MG tablet    Sig: Take 1 tablet by mouth every 8 (eight) hours as needed for moderate pain.    Dispense:  12 tablet    Refill:  0     Elberta Leatherwood, MD,MS,  PGY2 10/18/2015 3:42 PM

## 2015-10-22 ENCOUNTER — Encounter: Payer: Medicaid Other | Admitting: Physical Therapy

## 2015-10-25 ENCOUNTER — Ambulatory Visit: Payer: Medicaid Other | Attending: Orthopedic Surgery | Admitting: Physical Therapy

## 2015-10-25 DIAGNOSIS — M25561 Pain in right knee: Secondary | ICD-10-CM | POA: Diagnosis present

## 2015-10-25 DIAGNOSIS — R269 Unspecified abnormalities of gait and mobility: Secondary | ICD-10-CM | POA: Diagnosis present

## 2015-10-25 DIAGNOSIS — R600 Localized edema: Secondary | ICD-10-CM | POA: Diagnosis present

## 2015-10-25 DIAGNOSIS — R29898 Other symptoms and signs involving the musculoskeletal system: Secondary | ICD-10-CM | POA: Diagnosis present
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
## 2015-10-25 NOTE — Patient Instructions (Signed)
   Anita Laguna PT, DPT, LAT, ATC  [facility] Phone: [phone]     

## 2015-10-29 ENCOUNTER — Encounter: Payer: Medicaid Other | Admitting: Physical Therapy

## 2015-11-01 ENCOUNTER — Ambulatory Visit: Payer: Medicaid Other | Admitting: Physical Therapy

## 2015-11-01 DIAGNOSIS — R29898 Other symptoms and signs involving the musculoskeletal system: Secondary | ICD-10-CM

## 2015-11-01 DIAGNOSIS — R269 Unspecified abnormalities of gait and mobility: Secondary | ICD-10-CM

## 2015-11-01 DIAGNOSIS — M25561 Pain in right knee: Secondary | ICD-10-CM | POA: Diagnosis not present

## 2015-11-01 DIAGNOSIS — R600 Localized edema: Secondary | ICD-10-CM
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
## 2015-11-05 ENCOUNTER — Encounter: Payer: Medicaid Other | Admitting: Physical Therapy

## 2015-11-08 ENCOUNTER — Ambulatory Visit: Payer: Medicaid Other

## 2015-11-08 DIAGNOSIS — R600 Localized edema: Secondary | ICD-10-CM

## 2015-11-08 DIAGNOSIS — M25561 Pain in right knee: Secondary | ICD-10-CM | POA: Diagnosis not present

## 2015-11-08 DIAGNOSIS — R269 Unspecified abnormalities of gait and mobility: Secondary | ICD-10-CM

## 2015-11-08 DIAGNOSIS — R29898 Other symptoms and signs involving the musculoskeletal system: Secondary | ICD-10-CM

## 2015-11-08 NOTE — Patient Instructions (Addendum)
HOLD STRETCH 30 secs.  DO 3 times each, 4- 5 times a day!   Chair Knee Flexion   Keeping feet on floor, slide foot of operated leg back, bending knee. Hold ____ seconds. Repeat ____ times. Do ____ sessions a day.  Heel Slide   Bend left knee and pull heel toward buttocks. Use strap around foot and pull strap with arms to assist knee to bend further. Hold 10 secs.  Repeat ____ times. Do ____ sessions per day.  Knee Wall Slide   Slowly "walk" or slide feet on wall toward floor until stretch is felt in knees. Repeat ____ times per set. Do ____ sets per session. Do ____ sessions per day.
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
## 2015-11-12 ENCOUNTER — Encounter: Payer: Medicaid Other | Admitting: Physical Therapy

## 2015-11-15 ENCOUNTER — Ambulatory Visit: Payer: Medicaid Other | Attending: Orthopedic Surgery | Admitting: Physical Therapy

## 2015-11-15 DIAGNOSIS — R29898 Other symptoms and signs involving the musculoskeletal system: Secondary | ICD-10-CM | POA: Insufficient documentation

## 2015-11-15 DIAGNOSIS — R600 Localized edema: Secondary | ICD-10-CM | POA: Diagnosis present

## 2015-11-15 DIAGNOSIS — R269 Unspecified abnormalities of gait and mobility: Secondary | ICD-10-CM | POA: Diagnosis present

## 2015-11-15 DIAGNOSIS — M25561 Pain in right knee: Secondary | ICD-10-CM | POA: Diagnosis present
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
## 2015-11-15 NOTE — Patient Instructions (Signed)
   Cailee Blanke PT, DPT, LAT, ATC  [facility] Phone: [phone]     

## 2015-11-19 ENCOUNTER — Ambulatory Visit: Payer: Medicaid Other | Admitting: Physical Therapy

## 2015-11-22 ENCOUNTER — Ambulatory Visit: Payer: Medicaid Other | Admitting: Physical Therapy

## 2015-11-22 DIAGNOSIS — M25561 Pain in right knee: Secondary | ICD-10-CM

## 2015-11-22 DIAGNOSIS — R269 Unspecified abnormalities of gait and mobility: Secondary | ICD-10-CM

## 2015-11-22 DIAGNOSIS — R600 Localized edema: Secondary | ICD-10-CM

## 2015-11-22 DIAGNOSIS — R29898 Other symptoms and signs involving the musculoskeletal system: Secondary | ICD-10-CM
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
## 2015-11-29 ENCOUNTER — Ambulatory Visit: Payer: Medicaid Other | Admitting: Physical Therapy

## 2015-11-29 ENCOUNTER — Ambulatory Visit (INDEPENDENT_AMBULATORY_CARE_PROVIDER_SITE_OTHER): Payer: Medicaid Other | Admitting: *Deleted

## 2015-11-29 DIAGNOSIS — Z111 Encounter for screening for respiratory tuberculosis: Secondary | ICD-10-CM

## 2015-11-29 DIAGNOSIS — M25561 Pain in right knee: Secondary | ICD-10-CM

## 2015-11-29 DIAGNOSIS — R269 Unspecified abnormalities of gait and mobility: Secondary | ICD-10-CM

## 2015-11-29 DIAGNOSIS — R29898 Other symptoms and signs involving the musculoskeletal system: Secondary | ICD-10-CM

## 2015-11-29 DIAGNOSIS — R600 Localized edema: Secondary | ICD-10-CM
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
## 2015-11-29 NOTE — Progress Notes (Signed)
   Tuberculin skin test applied to Left  forearm. Patient to return on Monday 12/02/15 for PPD Reading.  Derl Barrow, RN

## 2015-12-02 ENCOUNTER — Ambulatory Visit (INDEPENDENT_AMBULATORY_CARE_PROVIDER_SITE_OTHER): Payer: Medicaid Other | Admitting: *Deleted

## 2015-12-02 DIAGNOSIS — Z111 Encounter for screening for respiratory tuberculosis: Secondary | ICD-10-CM

## 2015-12-02 LAB — TB SKIN TEST

## 2015-12-02 NOTE — Progress Notes (Signed)
   Patient was late for PPD Reading.  Advised patient that PPD needed to be repeated.  PPD placed Right arm, patient to return on Thursday at 3 PM for reading.  Advised patient if she is late the PPD will need to be replaced again.  Patient stated understanding.  Derl Barrow, RN

## 2015-12-05 ENCOUNTER — Ambulatory Visit (INDEPENDENT_AMBULATORY_CARE_PROVIDER_SITE_OTHER): Payer: Medicaid Other | Admitting: *Deleted

## 2015-12-05 ENCOUNTER — Encounter: Payer: Self-pay | Admitting: *Deleted

## 2015-12-05 DIAGNOSIS — Z7689 Persons encountering health services in other specified circumstances: Secondary | ICD-10-CM

## 2015-12-05 DIAGNOSIS — Z111 Encounter for screening for respiratory tuberculosis: Secondary | ICD-10-CM

## 2015-12-05 LAB — TB SKIN TEST
INDURATION: 0 mm
TB SKIN TEST: NEGATIVE

## 2015-12-05 NOTE — Progress Notes (Signed)
   PPD Reading Note PPD read and results entered in EpicCare. Result: 0 mm induration. Interpretation: Negative If test not read within 48-72 hours of initial placement, patient advised to repeat in other arm 1-3 weeks after this test. Allergic reaction: no  Quinnten Calvin L, RN  

## 2015-12-06 ENCOUNTER — Ambulatory Visit: Payer: Medicaid Other | Admitting: Physical Therapy

## 2015-12-06 DIAGNOSIS — R600 Localized edema: Secondary | ICD-10-CM

## 2015-12-06 DIAGNOSIS — M25561 Pain in right knee: Secondary | ICD-10-CM | POA: Diagnosis not present

## 2015-12-06 DIAGNOSIS — R29898 Other symptoms and signs involving the musculoskeletal system: Secondary | ICD-10-CM

## 2015-12-06 DIAGNOSIS — R269 Unspecified abnormalities of gait and mobility: Secondary | ICD-10-CM
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
## 2015-12-13 ENCOUNTER — Ambulatory Visit: Payer: Medicaid Other | Admitting: Physical Therapy

## 2016-01-08 ENCOUNTER — Encounter: Payer: Self-pay | Admitting: Family Medicine

## 2016-01-08 ENCOUNTER — Ambulatory Visit (INDEPENDENT_AMBULATORY_CARE_PROVIDER_SITE_OTHER): Payer: Medicaid Other | Admitting: Family Medicine

## 2016-01-08 VITALS — BP 129/75 | HR 71 | Temp 98.3°F | Ht 61.0 in | Wt 250.5 lb

## 2016-01-08 DIAGNOSIS — Z96651 Presence of right artificial knee joint: Secondary | ICD-10-CM

## 2016-01-08 DIAGNOSIS — L309 Dermatitis, unspecified: Secondary | ICD-10-CM | POA: Diagnosis not present

## 2016-01-08 MED ORDER — CLOBETASOL PROPIONATE 0.05 % EX OINT: 1.0000 "application " | TOPICAL_OINTMENT | Freq: Two times a day (BID) | CUTANEOUS | Status: DC

## 2016-01-08 NOTE — Progress Notes (Signed)
   Subjective:   April [name] is a 55 y.o. female with a history of OA of knee, eczema, morbid obesity here for f/u R knee pain and eczema  Eczema - comes and goes - clobetasol works to bring it down when it comes  R knee pain - S/p R TKR 09/23/15 - reports she has been exercising and is happy to have lost 10 lbs since our last visit - walking and pedaling and knee stretching with weights daily - continues to do PT exercises - pain is better than it was pre-op or immediately post-op - had difficulty sleeping after surgery - wonders about getting handicap sticker - has difficulty walking from further parking into stores for groceries etc - some difficultly getting in and out of the car - feels she is deconditioned after surgery - no longer taking pain meds - advil rarely  Review of Systems:  Per HPI.   Social History: never smoker  Objective:  BP 129/75 mmHg  Pulse 71  Temp(Src) 98.3 F (36.8 C) (Oral)  Ht 5\' 1"  (1.549 m)  Wt 250 lb 8 oz (113.626 kg)  BMI 47.36 kg/m2  Gen:  55 y.o. female in NAD  HEENT: NCAT, MMM CV: RRR, no MRG Resp: Non-labored, CTAB, no wheezes noted Ext: WWP, no edema MSK: R knee: flexion limited to 90deg, strength intact, no effusion or soft tissue swelling, vertical incision Skin: no rash present Neuro: Alert and oriented, speech normal     Assessment & Plan:     April [name] is a 55 y.o. female here for eczema, R knee pain.  Morbid obesity ([place]) Congratulated patient on her 10 pound weight loss Continue exercise efforts  S/P total knee replacement Seems to be healing well Encourage continued exercise Handicap Application filled out for temporary six-month time period Follow-up as needed  Eczema Well-controlled currently Encouraged lukewarm showers, unscented soaps, regular moisturizer Clobetasol refilled    [name], MD MPH PGY-2,  [facility] 01/08/2016  3:07 PM

## 2016-01-08 NOTE — Assessment & Plan Note (Signed)
Seems to be healing well Encourage continued exercise Handicap Application filled out for temporary six-month time period Follow-up as needed

## 2016-01-08 NOTE — Assessment & Plan Note (Signed)
Congratulated patient on her 10 pound weight loss Continue exercise efforts

## 2016-01-08 NOTE — Patient Instructions (Signed)
Nice to see you. Keep up the good work!  Take care, Dr. Jacinto Reap

## 2016-01-08 NOTE — Assessment & Plan Note (Signed)
Well-controlled currently Encouraged lukewarm showers, unscented soaps, regular moisturizer Clobetasol refilled

## 2016-02-24 ENCOUNTER — Ambulatory Visit: Payer: Medicaid Other | Admitting: Family Medicine

## 2016-03-02 ENCOUNTER — Encounter: Payer: Self-pay | Admitting: Family Medicine

## 2016-03-02 ENCOUNTER — Telehealth: Payer: Self-pay | Admitting: Family Medicine

## 2016-03-02 ENCOUNTER — Ambulatory Visit (INDEPENDENT_AMBULATORY_CARE_PROVIDER_SITE_OTHER): Payer: Medicaid Other | Admitting: Family Medicine

## 2016-03-02 DIAGNOSIS — Z7689 Persons encountering health services in other specified circumstances: Secondary | ICD-10-CM | POA: Insufficient documentation

## 2016-03-02 DIAGNOSIS — Z96651 Presence of right artificial knee joint: Secondary | ICD-10-CM

## 2016-03-02 DIAGNOSIS — R5382 Chronic fatigue, unspecified: Secondary | ICD-10-CM

## 2016-03-02 DIAGNOSIS — R5383 Other fatigue: Secondary | ICD-10-CM | POA: Insufficient documentation

## 2016-03-02 LAB — LIPID PANEL
CHOL/HDL RATIO: 4.2 ratio (ref <=5.0)
Cholesterol: 180 mg/dL (ref 125–200)
HDL: 43 mg/dL — ABNORMAL LOW (ref >=46)
LDL CALC: 108 mg/dL (ref <130)
Triglycerides: 143 mg/dL (ref <150)
VLDL: 29 mg/dL (ref <30)

## 2016-03-02 LAB — COMPLETE METABOLIC PANEL WITH GFR
ALBUMIN: 3.8 g/dL (ref 3.6–5.1)
ALK PHOS: 121 U/L (ref 33–130)
ALT: 8 U/L (ref 6–29)
AST: 16 U/L (ref 10–35)
BILIRUBIN TOTAL: 0.3 mg/dL (ref 0.2–1.2)
BUN: 16 mg/dL (ref 7–25)
CO2: 26 mmol/L (ref 20–31)
Calcium: 8.7 mg/dL (ref 8.6–10.4)
Chloride: 105 mmol/L (ref 98–110)
Creat: 0.87 mg/dL (ref 0.50–1.05)
GFR, Est African American: 88 mL/min (ref >=60)
GFR, Est Non African American: 76 mL/min (ref >=60)
GLUCOSE: 76 mg/dL (ref 65–99)
POTASSIUM: 4.1 mmol/L (ref 3.5–5.3)
SODIUM: 138 mmol/L (ref 135–146)
TOTAL PROTEIN: 7.3 g/dL (ref 6.1–8.1)

## 2016-03-02 LAB — CBC WITH DIFFERENTIAL/PLATELET
Basophils Absolute: 0 cells/uL (ref 0–200)
Basophils Relative: 0 %
EOS PCT: 3 %
Eosinophils Absolute: 213 cells/uL (ref 15–500)
HCT: 37.2 % (ref 35.0–45.0)
HEMOGLOBIN: 11.9 g/dL (ref 11.7–15.5)
LYMPHS ABS: 2414 {cells}/uL (ref 850–3900)
LYMPHS PCT: 34 %
MCH: 26.4 pg — AB (ref 27.0–33.0)
MCHC: 32 g/dL (ref 32.0–36.0)
MCV: 82.7 fL (ref 80.0–100.0)
MONOS PCT: 10 %
MPV: 10.9 fL (ref 7.5–12.5)
Monocytes Absolute: 710 cells/uL (ref 200–950)
NEUTROS PCT: 53 %
Neutro Abs: 3763 cells/uL (ref 1500–7800)
PLATELETS: 343 10*3/uL (ref 140–400)
RBC: 4.5 MIL/uL (ref 3.80–5.10)
RDW: 16.3 % — AB (ref 11.0–15.0)
WBC: 7.1 10*3/uL (ref 3.8–10.8)

## 2016-03-02 LAB — POCT GLYCOSYLATED HEMOGLOBIN (HGB A1C): HEMOGLOBIN A1C: 5.4

## 2016-03-02 LAB — TSH: TSH: 1.28 m[IU]/L

## 2016-03-02 NOTE — Assessment & Plan Note (Signed)
24-hour recall with reasonable intake Patient with overall weight loss since knee replacement Encouraged ongoing diet and exercise interventions Follow-up in 6 weeks to continue to monitor progress

## 2016-03-02 NOTE — Progress Notes (Signed)
   Subjective:   April [name] is a 55 y.o. female with a history of OA of knee, eczema, morbid obesity  here for eating a lot  Eating a lot - thinks she has been in denial about "eating disorder" for a long time - trying to do better since knee replacement - has been a problem for many years - had lost 10 lbs at last visit, but now back up 3 lbs - weakness is sweets - Denies binging or purging  24-hr recall:  (Up at 6 AM) and goes to gym B (9-10 AM)- 2 eggs scrambled, 1 slice wheat toast, grits Snk (11 AM)- granola bar  L (1 PM)- baked chicken/fish, greens, sometimes mashed potatoes   D (6 PM)- lunch leftovers  Snk (9 PM)- fruit or peanut butter on bread  Typical day? Yes.    Fatigue - feeling drained - sleep has improved after surgery - cooking is draining and sometimes goes to get fast food when she is tired of cooking - denies depressed mood  Reports that she needs referral to go back to [facility] for f/u on knee replacement - reports she is walking better than beforehand - still hurts to stand in place - makes it difficult at her job - doing PT and stretching knee but no better flexion - exercise: every other day, 58min-1hr, walking, knee stretching, elliptical, bike  Review of Systems:  Per HPI.   Social History: never smoker  Objective:  BP 126/70 mmHg  Pulse 68  Temp(Src) 98 F (36.7 C) (Oral)  Wt 253 lb (114.76 kg)  Gen:  55 y.o. female in NAD HEENT: NCAT, MMM, EOMI, PERRL, anicteric sclerae CV: RRR, no MRG Resp: Non-labored, CTAB, no wheezes noted Ext: WWP, no edema MSK: R knee flexion decreased by 20 deg compared to L knee, no TTP, no erythema or swelling Neuro: Alert and oriented, speech normal      Chemistry      Component Value Date/Time   NA 141 10/16/2015 0452   K 4.4 10/16/2015 0452   CL 108 10/16/2015 0452   CO2 24 10/16/2015 0452   BUN 10 10/16/2015 0452   CREATININE 0.50 10/16/2015 0452   CREATININE 0.51 02/22/2015 1646    Component Value Date/Time   CALCIUM 9.1 10/16/2015 0452   ALKPHOS 115 10/16/2015 0452   AST 20 10/16/2015 0452   ALT 14 10/16/2015 0452   BILITOT 0.5 10/16/2015 0452      Lab Results  Component Value Date   WBC 6.1 10/16/2015   HGB 11.3* 10/16/2015   HCT 36.3 10/16/2015   MCV 85.0 10/16/2015   PLT 476* 10/16/2015   Lab Results  Component Value Date   TSH 1.121 12/01/2012   Lab Results  Component Value Date   HGBA1C 5.3 02/22/2015   Assessment & Plan:     April [name] is a 56 y.o. female here for   Morbid obesity ([place]) 24-hour recall with reasonable intake Patient with overall weight loss since knee replacement Encouraged ongoing diet and exercise interventions Follow-up in 6 weeks to continue to monitor progress  S/P total knee replacement New Referral for orthopedics placed  Fatigue She denies any depressed mood We'll check labs including TSH, CMP, CBC to evaluate for cause       [name], MD MPH PGY-2,  [facility] 03/02/2016  12:03 PM

## 2016-03-02 NOTE — Patient Instructions (Signed)
Nice to see you again today. We are getting some labs today and someone will call you or send you a letter with the results when they're available. I have put in a referral for you to go back to orthopedist. You can call them and make an appointment.  Continue with the diet and exercise.  Take care, Dr. B  Fatigue Fatigue is feeling tired all of the time, a lack of energy, or a lack of motivation. Occasional or mild fatigue is often a normal response to activity or life in general. However, long-lasting (chronic) or extreme fatigue may indicate an underlying medical condition. HOME CARE INSTRUCTIONS  Watch your fatigue for any changes. The following actions may help to lessen any discomfort you are feeling:  Talk to your health care provider about how much sleep you need each night. Try to get the required amount every night.  Take medicines only as directed by your health care provider.  Eat a healthy and nutritious diet. Ask your health care provider if you need help changing your diet.  Drink enough fluid to keep your urine clear or pale yellow.  Practice ways of relaxing, such as yoga, meditation, massage therapy, or acupuncture.  Exercise regularly.   Change situations that cause you stress. Try to keep your work and personal routine reasonable.  Do not abuse illegal drugs.  Limit alcohol intake to no more than 1 drink per day for nonpregnant women and 2 drinks per day for men. One drink equals 12 ounces of beer, 5 ounces of wine, or 1 ounces of hard liquor.  Take a multivitamin, if directed by your health care provider. SEEK MEDICAL CARE IF:   Your fatigue does not get better.  You have a fever.   You have unintentional weight loss or gain.  You have headaches.   You have difficulty:   Falling asleep.  Sleeping throughout the night.  You feel angry, guilty, anxious, or sad.   You are unable to have a bowel movement (constipation).   You skin is dry.    Your legs or another part of your body is swollen.  SEEK IMMEDIATE MEDICAL CARE IF:   You feel confused.   Your vision is blurry.  You feel faint or pass out.   You have a severe headache.   You have severe abdominal, pelvic, or back pain.   You have chest pain, shortness of breath, or an irregular or fast heartbeat.   You are unable to urinate or you urinate less than normal.   You develop abnormal bleeding, such as bleeding from the rectum, vagina, nose, lungs, or nipples.  You vomit blood.   You have thoughts about harming yourself or committing suicide.   You are worried that you might harm someone else.    This information is not intended to replace advice given to you by your health care provider. Make sure you discuss any questions you have with your health care provider.   Document Released: 07/26/2007 Document Revised: 10/19/2014 Document Reviewed: 01/30/2014 Elsevier Interactive Patient Education [employer].

## 2016-03-02 NOTE — Telephone Encounter (Signed)
LMOVM with female. Please advise patient she has an appointment 03/05/16 at 10 AM with Dr. Noemi Chapel at [facility].

## 2016-03-02 NOTE — Assessment & Plan Note (Signed)
She denies any depressed mood We'll check labs including TSH, CMP, CBC to evaluate for cause

## 2016-03-02 NOTE — Assessment & Plan Note (Signed)
New Referral for orthopedics placed

## 2016-03-03 ENCOUNTER — Encounter: Payer: Self-pay | Admitting: Family Medicine

## 2016-05-19 ENCOUNTER — Ambulatory Visit (INDEPENDENT_AMBULATORY_CARE_PROVIDER_SITE_OTHER): Payer: Medicaid Other | Admitting: Internal Medicine

## 2016-05-19 ENCOUNTER — Encounter: Payer: Self-pay | Admitting: Internal Medicine

## 2016-05-19 DIAGNOSIS — F329 Major depressive disorder, single episode, unspecified: Secondary | ICD-10-CM | POA: Diagnosis not present

## 2016-05-19 DIAGNOSIS — R5382 Chronic fatigue, unspecified: Secondary | ICD-10-CM

## 2016-05-19 DIAGNOSIS — M25532 Pain in left wrist: Secondary | ICD-10-CM | POA: Insufficient documentation

## 2016-05-19 DIAGNOSIS — F32A Depression, unspecified: Secondary | ICD-10-CM

## 2016-05-19 MED ORDER — BUPROPION HCL ER (XL) 150 MG PO TB24: 300.0000 mg | ORAL_TABLET | Freq: Every day | ORAL | 0 refills | Status: DC

## 2016-05-19 NOTE — Assessment & Plan Note (Signed)
Chronic. Reports adequate amount of sleep nightly and feeling refreshed upon awakening, so lack of sleep less likely cause. TSH, CBC, and BMP performed three months ago with no abnormalities, so will not repeat today. Seems most likely 2/2 depression.  - Begin Wellbutrin

## 2016-05-19 NOTE — Assessment & Plan Note (Signed)
Mild tenderness to palpation on exam, but no other abnormalities noted. No weakness or neuro deficits.  - Ibuprofen or Tylenol PRN - Can place ice pack on the area if provides pain relief - Continue to monitor

## 2016-05-19 NOTE — Patient Instructions (Signed)
It was nice meeting you today April [name].   Please begin by taking one Wellbutrin tablet (150 mg) in the morning. After four days, increase to two tablets (300 mg) in the morning. I will see you back in four weeks to reassess your symptoms, and we will make any medication changes as necessary at that time.   If you have any questions or concerns, please feel free to call the clinic.   Be well,  Dr. Avon Gully

## 2016-05-19 NOTE — Assessment & Plan Note (Signed)
PHQ-9 score of 9 today (mild depression), however patient's affect and description of symptoms more consistent with greater severity of depression. Denies SI/HI. Reports taking Zoloft in the past with no improvement in symptoms, and does not want to take anything similar to this.  - Begin Wellbutrin XL 150mg  for first four days, then increase to 300mg  qd - F/u in one month

## 2016-05-19 NOTE — Progress Notes (Signed)
   Subjective:    Patient ID: April [name], female    DOB: [birth year], 55 y.o.   MRN: [id number]  HPI  Patient presents for pain in wrist as well as fatigue.   Wrist pain Reporting pain in L wrist for past 2.5 weeks. Denies trauma prior to onset. Denies bruising, redness. Has not taken anything for pain. Pain not interfering with patient's normal activities. Denies numbness, tingling, weakness.   Fatigue Patient reporting fatigue for at least the past three months. She said she is not able to be as active as she was previously. She wants to continue to go to the [facility] to exercise, but often is too tired to go. SHe goes to bed around 9-10PM each night, and usually wakes up around 4AM. She feels well-rested upon awakening. Sometimes she naps during the day, but she tries not to. Denies nighttime awakening. Does not drink caffeine.   Review of Systems See HPI.     Objective:   Physical Exam  Constitutional: She is oriented to person, place, and time. She appears well-developed and well-nourished. No distress.  Cardiovascular: Normal rate, regular rhythm and normal heart sounds.   No murmur heard. Pulmonary/Chest: Effort normal and breath sounds normal. No respiratory distress.  Musculoskeletal:  Mild pain upon palpation of medial wrist. No swelling, ecchymosis, or erythema noted. Full ROM. 5/5 strength upper extremities bilaterally. Equal grip strength bilaterally.   Neurological: She is alert and oriented to person, place, and time.  Psychiatric:  Flat affect      Assessment & Plan:  Fatigue Chronic. Reports adequate amount of sleep nightly and feeling refreshed upon awakening, so lack of sleep less likely cause. TSH, CBC, and BMP performed three months ago with no abnormalities, so will not repeat today. Seems most likely 2/2 depression.  - Begin Wellbutrin  Depression PHQ-9 score of 9 today (mild depression), however patient's affect and description of symptoms more consistent  with greater severity of depression. Denies SI/HI. Reports taking Zoloft in the past with no improvement in symptoms, and does not want to take anything similar to this.  - Begin Wellbutrin XL 150mg  for first four days, then increase to 300mg  qd - F/u in one month  Wrist pain, left Mild tenderness to palpation on exam, but no other abnormalities noted. No weakness or neuro deficits.  - Ibuprofen or Tylenol PRN - Can place ice pack on the area if provides pain relief - Continue to monitor  Adin Hector, MD, MPH PGY-2 [facility] Pager [phone]

## 2016-06-05 ENCOUNTER — Ambulatory Visit: Payer: Medicaid Other | Admitting: Family Medicine

## 2016-06-08 ENCOUNTER — Ambulatory Visit: Payer: Medicaid Other | Admitting: Internal Medicine

## 2016-07-16 ENCOUNTER — Ambulatory Visit: Payer: Medicaid Other | Admitting: Family Medicine

## 2016-07-23 ENCOUNTER — Ambulatory Visit (INDEPENDENT_AMBULATORY_CARE_PROVIDER_SITE_OTHER): Payer: Medicaid Other | Admitting: Family Medicine

## 2016-07-23 ENCOUNTER — Encounter: Payer: Self-pay | Admitting: Family Medicine

## 2016-07-23 ENCOUNTER — Encounter: Payer: Self-pay | Admitting: Licensed Clinical Social Worker

## 2016-07-23 VITALS — BP 108/72 | HR 71 | Temp 98.3°F | Ht 61.0 in | Wt 255.8 lb

## 2016-07-23 DIAGNOSIS — F331 Major depressive disorder, recurrent, moderate: Secondary | ICD-10-CM | POA: Diagnosis not present

## 2016-07-23 DIAGNOSIS — M1711 Unilateral primary osteoarthritis, right knee: Secondary | ICD-10-CM | POA: Diagnosis not present

## 2016-07-23 DIAGNOSIS — F411 Generalized anxiety disorder: Secondary | ICD-10-CM | POA: Diagnosis not present

## 2016-07-23 MED ORDER — CITALOPRAM HYDROBROMIDE 20 MG PO TABS: 20.0000 mg | ORAL_TABLET | Freq: Every day | ORAL | 1 refills | Status: DC

## 2016-07-23 NOTE — Patient Instructions (Signed)
Major Depressive Disorder Major depressive disorder is a mental illness. It also may be called clinical depression or unipolar depression. Major depressive disorder usually causes feelings of sadness, hopelessness, or helplessness. Some people with this disorder do not feel particularly sad but lose interest in doing things they used to enjoy (anhedonia). Major depressive disorder also can cause physical symptoms. It can interfere with work, school, relationships, and other normal everyday activities. The disorder varies in severity but is longer lasting and more serious than the sadness we all feel from time to time in our lives. Major depressive disorder often is triggered by stressful life events or major life changes. Examples of these triggers include divorce, loss of your job or home, a move, and the death of a family member or close friend. Sometimes this disorder occurs for no obvious reason at all. People who have family members with major depressive disorder or bipolar disorder are at higher risk for developing this disorder, with or without life stressors. Major depressive disorder can occur at any age. It may occur just once in your life (single episode major depressive disorder). It may occur multiple times (recurrent major depressive disorder). SYMPTOMS People with major depressive disorder have either anhedonia or depressed mood on nearly a daily basis for at least 2 weeks or longer. Symptoms of depressed mood include:  Feelings of sadness (blue or down in the dumps) or emptiness.  Feelings of hopelessness or helplessness.  Tearfulness or episodes of crying (may be observed by others).  Irritability (children and adolescents). In addition to depressed mood or anhedonia or both, people with this disorder have at least four of the following symptoms:  Difficulty sleeping or sleeping too much.   Significant change (increase or decrease) in appetite or weight.   Lack of energy or  motivation.  Feelings of guilt and worthlessness.   Difficulty concentrating, remembering, or making decisions.  Unusually slow movement (psychomotor retardation) or restlessness (as observed by others).   Recurrent wishes for death, recurrent thoughts of self-harm (suicide), or a suicide attempt. People with major depressive disorder commonly have persistent negative thoughts about themselves, other people, and the world. People with severe major depressive disorder may experiencedistorted beliefs or perceptions about the world (psychotic delusions). They also may see or hear things that are not real (psychotic hallucinations). DIAGNOSIS Major depressive disorder is diagnosed through an assessment by your health care provider. Your health care provider will ask aboutaspects of your daily life, such as mood,sleep, and appetite, to see if you have the diagnostic symptoms of major depressive disorder. Your health care provider may ask about your medical history and use of alcohol or drugs, including prescription medicines. Your health care provider also may do a physical exam and blood work. This is because certain medical conditions and the use of certain substances can cause major depressive disorder-like symptoms (secondary depression). Your health care provider also may refer you to a mental health specialist for further evaluation and treatment. TREATMENT It is important to recognize the symptoms of major depressive disorder and seek treatment. The following treatments can be prescribed for this disorder:   Medicine. Antidepressant medicines usually are prescribed. Antidepressant medicines are thought to correct chemical imbalances in the brain that are commonly associated with major depressive disorder. Other types of medicine may be added if the symptoms do not respond to antidepressant medicines alone or if psychotic delusions or hallucinations occur.  Talk therapy. Talk therapy can be  helpful in treating major depressive disorder by providing   support, education, and guidance. Certain types of talk therapy also can help with negative thinking (cognitive behavioral therapy) and with relationship issues that trigger this disorder (interpersonal therapy). A mental health specialist can help determine which treatment is best for you. Most people with major depressive disorder do well with a combination of medicine and talk therapy. Treatments involving electrical stimulation of the brain can be used in situations with extremely severe symptoms or when medicine and talk therapy do not work over time. These treatments include electroconvulsive therapy, transcranial magnetic stimulation, and vagal nerve stimulation.   This information is not intended to replace advice given to you by your health care provider. Make sure you discuss any questions you have with your health care provider.   Document Released: 01/23/2013 Document Revised: 10/19/2014 Document Reviewed: 01/23/2013 Elsevier Interactive Patient Education 2016 Elsevier Inc.  

## 2016-07-23 NOTE — Assessment & Plan Note (Signed)
Anxiety seems stable today, but would avoid Wellbutrin for activating effects Start Celexa 20 mg daily Follow-up in 2 weeks [facility] consult today

## 2016-07-23 NOTE — Progress Notes (Signed)
Patient ID: April [name], female   DOB: 1961-04-11, 55 y.o.   MRN: [id number]   SUBJECTIVE: April [name] is a 55 y.o. female  Pt. was referred by Dr. Brita Romp during her office visit for:  anxiety and depression. Pt. reports the following symptoms/concerns: frustrated, irritable and agitated.  Duration of problem:  On and off for several years, symptoms have gotten worse over past several months. Parent reports this usually happens when she experiences changes.  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 Patient enjoys praying Previous treatment: psychotropic medication in the past as well as psychotherapy about one year ago.  OBJECTIVE: Mood: Depressed & Affect: Appropriate Risk of harm to self or others: denies SI or self-harm Assessments administered: PHQ-9 and GAD Depression screen [facility] 2/9 07/23/2016 07/23/2016 05/19/2016  Decreased Interest 2 1 0  Down, Depressed, Hopeless 2 1 0  PHQ - 2 Score 4 2 0  Altered sleeping 2 - -  Tired, decreased energy 2 - -  Change in appetite 2 - -  Feeling bad or failure about yourself  0 - -  Trouble concentrating 3 - -  Moving slowly or fidgety/restless 2 - -  Suicidal thoughts 0 - -  PHQ-9 Score 15 - -   GAD 7 : Generalized Anxiety Score 07/23/2016  Nervous, Anxious, on Edge 2  Control/stop worrying 2  Worry too much - different things 2  Trouble relaxing 2  Restless 2  Easily annoyed or irritable 1  Afraid - awful might happen 2  Total GAD 7 Score 13   LIFE CONTEXT:  Family & Social: lives with daughter, limited family support, attends church.   School/ Work: receives SSI disability and is in school PT. Self-Care: sleep difficulties, does exercise at the [facility] but admits she needs to find things to do that she enjoys. Life changes: recently started an internship.  Having difficulty managing family stressors. What is important to pt/family (values): patient's faith is important to her.   GOALS ADDRESSED:  Patient  wants to be able to put the past behind her and manage her stressors.  INTERVENTIONS: Motivational Interviewing, Solution Focused and Other: Relaxation Tech   ASSESSMENT:  Pt currently experiencing Depression and anxiety exacerbated by family stressors .  Pt may benefit from and is in agreement to receive further assessment and therapeutic interventions to assist with managing her anxiety and depression.   PLAN: 1. F/U with behavioral health clinician: in two weeks when she returns to see PCP 2. Behavioral Health meds: will start taking Celexa  3. Behavioral recommendations: patient is in agreement to implement relaxation tech, and contact her therapist which she has established a great relationship with to start seeing her again. 4. Referral: Problem-solving teaching/coping strategies, Psychoeducation and   Casimer Lanius, LCSW Licensed Clinical Social Worker [facility]   [phone] 11:32 AM  Geraldine Contras: yes

## 2016-07-23 NOTE — Assessment & Plan Note (Signed)
PH Q9 of 15, up from 9 at last visit GAD7 score of 13 Stop Wellbutrin due to activating effects No SI or HI Start Celexa 20 mg daily Follow-up in 2 weeks and consider increasing dose to 40 mg at that time [facility] consult and counseling encourage

## 2016-07-23 NOTE — Progress Notes (Signed)
   Subjective:   April [name] is a 55 y.o. female with a history of DJD of the knee, morbid obesity, depression and anxiety here for concern that she may have ADD  Had palpitations after taking prednisone for knee pain prescribed by Ortho Resolved after stopping prednisone  Thinks she may have ADD Since after knee replacement, she has more time to think about it Noticed that she is fidgety, difficult to focus Really distracted all the time - ongoing for years Isolating from people at times Had been previously diagnosed with anxiety and PTSD Previously took Zoloft and didn't like side effect Started Wellbutrin in 05/2016 and stopped due to  felt like she was taking caffeine Has done counseling in the past - always been aware of what is going on Denies SI, HI  Knee hurts s/p TKR Worse with standing in place Difficult to lose weight' Following with Ortho No swelling, redness, fevers   Review of Systems:  Per HPI.   Social History: Former smoker  Objective:  BP 108/72   Pulse 71   Temp 98.3 F (36.8 C) (Oral)   Ht 5\' 1"  (1.549 m)   Wt 255 lb 12.8 oz (116 kg)   SpO2 95%   BMI 48.33 kg/m   Gen:  55 y.o. female in NAD HEENT: NCAT, MMM, EOMI, PERRL, anicteric sclerae CV: RRR, no MRG Resp: Non-labored, CTAB, no wheezes noted Ext: WWP, no edema MSK: No obvious deformities, gait intact Neuro: Alert and oriented, speech normal Psych: Depressed mood, normal affect, no pressured speech      Chemistry      Component Value Date/Time   NA 138 03/02/2016 1137   K 4.1 03/02/2016 1137   CL 105 03/02/2016 1137   CO2 26 03/02/2016 1137   BUN 16 03/02/2016 1137   CREATININE 0.87 03/02/2016 1137      Component Value Date/Time   CALCIUM 8.7 03/02/2016 1137   ALKPHOS 121 03/02/2016 1137   AST 16 03/02/2016 1137   ALT 8 03/02/2016 1137   BILITOT 0.3 03/02/2016 1137      Lab Results  Component Value Date   WBC 7.1 03/02/2016   HGB 11.9 03/02/2016   HCT 37.2  03/02/2016   MCV 82.7 03/02/2016   PLT 343 03/02/2016   Lab Results  Component Value Date   TSH 1.28 03/02/2016   Lab Results  Component Value Date   HGBA1C 5.4 03/02/2016   Assessment & Plan:     April [name] is a 55 y.o. female here for   DJD (degenerative joint disease) of knee Continue to follow with orthopedics Recommended continuing to do physical therapy exercises at home and walking as much as possible  Anxiety state Anxiety seems stable today, but would avoid Wellbutrin for activating effects Start Celexa 20 mg daily Follow-up in 2 weeks [facility] consult today  Depression PH Q9 of 15, up from 9 at last visit GAD7 score of 13 Stop Wellbutrin due to activating effects No SI or HI Start Celexa 20 mg daily Follow-up in 2 weeks and consider increasing dose to 40 mg at that time [facility] consult and counseling encourage    April Crews, MD MPH PGY-3,  [facility] 07/23/2016  12:01 PM

## 2016-07-23 NOTE — Assessment & Plan Note (Signed)
Continue to follow with orthopedics Recommended continuing to do physical therapy exercises at home and walking as much as possible

## 2016-08-18 ENCOUNTER — Telehealth: Payer: Self-pay | Admitting: Licensed Clinical Social Worker

## 2016-08-18 NOTE — Progress Notes (Signed)
Follow up call to patient from [facility] visit on 07/23/16.  Phone rang, unable to leave a message.   Casimer Lanius, LCSW Licensed Clinical Social Worker [place]   [phone] 10:42 AM

## 2016-09-18 ENCOUNTER — Ambulatory Visit (INDEPENDENT_AMBULATORY_CARE_PROVIDER_SITE_OTHER): Payer: Medicaid Other | Admitting: Family Medicine

## 2016-09-18 VITALS — BP 122/84 | HR 71 | Temp 97.6°F | Wt 263.0 lb

## 2016-09-18 DIAGNOSIS — F331 Major depressive disorder, recurrent, moderate: Secondary | ICD-10-CM | POA: Diagnosis not present

## 2016-09-18 DIAGNOSIS — F411 Generalized anxiety disorder: Secondary | ICD-10-CM | POA: Diagnosis not present

## 2016-09-18 DIAGNOSIS — Z96651 Presence of right artificial knee joint: Secondary | ICD-10-CM

## 2016-09-18 NOTE — Patient Instructions (Signed)
Basis you again today. Continue to process for your depression and anxiety. He stopped all medications. Consider talking to her therapist again. We will follow-up in 3 months.  Please continue to see her orthopedist for your knee replacement. You may need to consider the manipulation they're talking about to be able to bend her leg again fully.  Take care, Dr. Jacinto Reap

## 2016-09-18 NOTE — Assessment & Plan Note (Signed)
Unable to fully flex her right knee after total knee replacement in 09/2015 Pain is much improved Still following with orthopedics and considering manipulation under anesthesia Handicap placard (temporary) renewed for 6 months

## 2016-09-18 NOTE — Assessment & Plan Note (Signed)
See plan above for depression 

## 2016-09-18 NOTE — Assessment & Plan Note (Signed)
Improving Not on medications as she stopped Celexa between last visit now No SI or HI PH Q9 of 10, down from 15 at last visit GAD 7 score of 4, down from 13 at last visit Continue therapy Follow-up in 3 months

## 2016-09-18 NOTE — Progress Notes (Signed)
   Subjective:   April [name] is a 55 y.o. female with a history of Depression, anxiety, morbid obesity, HLD here for  Chief Complaint  Patient presents with  . Depression    Depression and anxiety Previously tried Wellbutrin, but this was stopped due to increased anxiety due to activating effects Patient was started on Celexa 20 mg daily on 07/23/16 Only took for a few weeks Feels like SEs (mostly complains of "weird feelings" and fatigue) are not tolerable Doesn't want to try any other medications Thinks writing and talking are more therapeutic Went back to therapist once which was helpful Feels like mood is "OK" - has been thinking about and examining her life  Thinks about how life is consumed with worthless thoughts Trying to put energy into more productive things Thinks she might gain weight while she is trying to change and analyze Still working out at [place] sticker renewed - initially applied for by [facility] - s/p R TKR in 12/16 - still not 100% flexion ROM - pain is better -sees Ortho next week - some talk about manipulation under anesthesia   GAD 7 - 4, somewhat difficult (13 in 07/2016) PHQ9 - 10, 0 on question 9, somewhat difficult (15 in 07/2016)  Review of Systems:  Per HPI.   Social History: former smoker  Objective:  BP 122/84   Pulse 71   Temp 97.6 F (36.4 C) (Oral)   Wt 263 lb (119.3 kg)   BMI 49.69 kg/m   Gen:  55 y.o. female in NAD HEENT: NCAT, MMM, anicteric sclerae CV: RRR, no MRG Resp: Non-labored, CTAB, no wheezes noted Ext: WWP, no edema MSK: Gait intact, unable to fully flex R knee Neuro: Alert and oriented, speech normal Psych: Appropriate grooming and dress, no pressured speech, no evidence of AVH      Assessment & Plan:     April [name] is a 55 y.o. female here for   Depression Improving Not on medications as she stopped Celexa between last visit now No SI or HI PH Q9 of 10, down from 15 at last  visit GAD 7 score of 4, down from 13 at last visit Continue therapy Follow-up in 3 months  Anxiety state See plan above for depression  S/P total knee replacement Unable to fully flex her right knee after total knee replacement in 09/2015 Pain is much improved Still following with orthopedics and considering manipulation under anesthesia Handicap placard (temporary) renewed for 6 months     [name], MD MPH PGY-3,  [facility] 09/18/2016  2:36 PM

## 2016-11-16 DIAGNOSIS — Z96651 Presence of right artificial knee joint: Secondary | ICD-10-CM | POA: Diagnosis not present

## 2017-01-20 ENCOUNTER — Telehealth: Payer: Self-pay | Admitting: Family Medicine

## 2017-01-20 NOTE — Telephone Encounter (Signed)
Tia,  She previously was seen by Raliegh Ip and they did total knee replacement.  Can we just renew her referral?  [name], MD, MPH PGY-3,  [facility] 01/20/2017 1:52 PM

## 2017-01-20 NOTE — Telephone Encounter (Signed)
12 Visits authorized for patient. Pt advised to call and make appt with Raliegh Ip.

## 2017-01-20 NOTE — Telephone Encounter (Signed)
Patient called today requesting to be referred to:  Specialist:  ortho Provider name/phone number:  Raliegh Ip Diagnosis (code):  Left Knee pain  Date of last OV: 09-18-16  Note routed to provider and Referral Coordinator [facility]).  Will call patient back regarding referral status.  Renella Cunas

## 2017-01-25 ENCOUNTER — Ambulatory Visit: Payer: Medicaid Other | Admitting: Family Medicine

## 2017-01-26 ENCOUNTER — Encounter: Payer: Self-pay | Admitting: Family Medicine

## 2017-01-26 ENCOUNTER — Ambulatory Visit (INDEPENDENT_AMBULATORY_CARE_PROVIDER_SITE_OTHER): Payer: Medicaid Other | Admitting: Family Medicine

## 2017-01-26 VITALS — BP 104/86 | HR 88 | Temp 98.8°F | Ht 61.0 in | Wt 264.0 lb

## 2017-01-26 DIAGNOSIS — Z96651 Presence of right artificial knee joint: Secondary | ICD-10-CM

## 2017-01-26 DIAGNOSIS — M170 Bilateral primary osteoarthritis of knee: Secondary | ICD-10-CM | POA: Diagnosis not present

## 2017-01-26 NOTE — Progress Notes (Signed)
   Subjective:   April [name] is a 56 y.o. female with a history of Depression, anxiety, morbid obesity, HLD here for  Chief Complaint  Patient presents with  . Knee Pain  . Depression   R knee pain - s/p R TKR 12/16 - cannot get to 90 degrees of flexion now after TKR - going to [employer] 3 x/wk - had discussed possibility of  - left knee seems to pop - knows this is arthritis also and doesn't want another TKR - wonders if swelling is inhibiting movement - steroid shots initially helped with R knee, doesn't think she ever had any in the L knee - wants to discuss further with Ortho later this week  Declines to discuss depression and states that is not the reason for her visit  Review of Systems:  Per HPI.   Social History: former smoker  Objective:  BP 104/86   Pulse 88   Temp 98.8 F (37.1 C) (Oral)   Ht 5\' 1"  (1.549 m)   Wt 264 lb (119.7 kg)   BMI 49.88 kg/m   Gen:  56 y.o. female in NAD HEENT: NCAT, MMM, anicteric sclerae CV: RRR, no MRG Resp: Non-labored, CTAB, no wheezes noted Ext: WWP, no edema MSK: Gait intact, unable to flex R knee with Passive or active range of motion past 100. Left knee with full range of motion, no erythema, no effusion, no tenderness palpation, ligaments intact with solid endpoints, crepitus throughout range of motion felt under patella Neuro: Alert and oriented, speech normal Psych: Appropriate grooming and dress, no pressured speech, no evidence of AVH  Assessment & Plan:     April [name] is a 56 y.o. female here for   Osteoarthritis of knees, bilateral Continue to follow with orthopedics Recommend more physical therapy and continuing to do exercises at home and walk as much as possible Patient may benefit from corticosteroid injection in the left knee which has not been replaced  S/P total knee replacement Limited flexion status post right TKR in 09/2015 Patient is still following with orthopedics Physical therapy may be  able to help get some more flexion in the knee   [name], MD MPH PGY-3,  [place] 01/26/2017  5:00 PM

## 2017-01-26 NOTE — Patient Instructions (Signed)
Nice to see you again today. I'm sorry about your knee pain. Continue to talk to the orthopedist about this. You may be able to get a corticosteroid injection in your left knee that has not yet been replaced. This will help with the pain from your arthritis. Continue to work on exercise and keep trying to bend the right knee.  Come back in 6 weeks for your physical.  Take care, Dr. Jacinto Reap

## 2017-01-26 NOTE — Assessment & Plan Note (Signed)
Continue to follow with orthopedics Recommend more physical therapy and continuing to do exercises at home and walk as much as possible Patient may benefit from corticosteroid injection in the left knee which has not been replaced

## 2017-01-26 NOTE — Assessment & Plan Note (Signed)
Limited flexion status post right TKR in 09/2015 Patient is still following with orthopedics Physical therapy may be able to help get some more flexion in the knee

## 2017-05-25 ENCOUNTER — Telehealth: Payer: Self-pay | Admitting: Family Medicine

## 2017-05-25 NOTE — Telephone Encounter (Signed)
Pt needs a new handicap pass. ep

## 2017-05-26 NOTE — Telephone Encounter (Signed)
Please inform patient that since I have never seen patient before, I will need to see patient and perform exam in order to sign handicap pass paperwork.   Thank you,  Caroline More, DO

## 2017-06-01 NOTE — Telephone Encounter (Signed)
Patient informed, appointment scheduled 9/4 with PCP.

## 2017-06-15 ENCOUNTER — Ambulatory Visit: Payer: Medicaid Other | Admitting: Family Medicine

## 2017-09-21 NOTE — Progress Notes (Deleted)
April Lynch, MD, MS Phone: [phone]  Subjective:  CC -- Annual Physical; With complaints of ***  Pt reports she ***   Cardiovascular: - Risk as of ***(date): *** (assessment every 3-5 years) - Dx Hypertension: {YES/NO/WILD PPJKD:32671}  - Dx Hyperlipidemia: {YES/NO/WILD IWPYK:99833} (once age 55-21; high risk >35yo, low risk >45)*** - Dx Obesity: {YES/NO/WILD CARDS:18581} (Class I BMI <34.9, Class II <39.9, Class III < 49.9)*** - Physical Activity: {YES/NO/WILD ASNKN:39767}  - Diabetes: {YES/NO/WILD HALPF:79024} (age 53-70 w/ BMI >25, or HTN, or HLD) *** (A1c >6.5, or classic Sxs w/ random CBG >200, or FBG >126 (fasting >8hr))***  Cancer: Colorectal >> Colonoscopy: {YES/NO/WILD OXBDZ:32992} (Risk factors?, W/o risk factors > 50yo)*** Lung >> Tobacco Use: {YES/NO/WILD EQAST:41962} (age 60-74 w/ >55yr/pack/yr Hx, unless quit >4yr ago) ***  - If so, previous Low-Dose CT screen: {YES/NO/WILD IWLNL:89211}  Breast >> Mammogram: {YES/NO/WILD CARDS:18581} (>40yo to be discussed; Q41yrs)*** Cervical/Endometrial >>  - Postmenopausal: {YES/NO/WILD HERDE:08144}  - Vaginal Bleeding: {YES/NO/WILD YJEHU:31497} - Pap Smear: {YES/NO/WILD WYOVZ:85885}   - Previous Abnormal Pap: {YES/NO/WILD CARDS:18581} (21-29yo Q66yrs; >30yo Q87yr w/ neg HPV)*** Skin >> Suspicious lesions: {YES/NO/WILD OYDXA:12878}   Social: Alcohol Use: {YES/NO/WILD MVEHM:09470}  Tobacco Use: {YES/NO/WILD JGGEZ:66294}   - Interested in Quitting: {YES/NO/WILD TMLYY:50354}  Other Drugs: {YES/NO/WILD SFKCL:27517}  Risky Sexual Behavior: {YES/NO/WILD CARDS:18581}  - Chlamydia: <25yo, or >25yo and incr risk - Gonorrhea: sexually active <25yo, or at increased risk - Syphilis: If at increased risk - HIV: All individuals (15-18yo once, then annually if risks are high) >> should be checked anytime STDs are checked. - Hep C: once if born in [place] between 1945-1965; or at increased risk - Hep B: If at increased risk*** Depression:  {YES/NO/WILD CARDS:18581}   - PHQ9 score:  Support and Life at Home: {YES/NO/WILD GYFVC:94496}   Other: Osteoporosis: {YES/NO/WILD CARDS:18581} (postmenopausal women <65yo with risk factors -- do BMD screen)*** Zoster Vaccine: {YES/NO/WILD PRFFM:38466} (those >50yo)*** Flu Vaccine: {YES/NO/WILD ZLDJT:70177}  Pneumonia Vaccine: {YES/NO/WILD LTJQZ:00923} (those w/ risk factors) - Both: Immunocompromised, cochlear implant, CSF leak, asplenic, sickle cell, CKD - PPSV-23 only: Heart dz, lung disease, DM, tobacco abuse, alcoholism, cirrhosis/liver disease.***   ROS-  Past Medical History Patient Active Problem List   Diagnosis Date Noted  . Wrist pain, left 05/19/2016  . Fatigue 03/02/2016  . Urticaria   . 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 Menopause 12/11/2014  . 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 Healthcare maintenance 12/01/2012  . Low back pain 01/27/2012  . GERD 03/22/2009  . Anxiety state 11/14/2008  . Eczema 05/18/2008  . Depression 03/10/2007  . HYPERCHOLESTEROLEMIA 12/09/2006  . Morbid obesity ([place]) 12/09/2006  . Anemia 12/09/2006  . HEMORRHOIDS, NOS 12/09/2006    Medications- reviewed and updated Current Outpatient Medications  Medication Sig Dispense Refill  . acetaminophen (TYLENOL 8 HOUR) 650 MG CR tablet Take 1 tablet (650 mg total) by mouth every 8 (eight) hours as needed for pain. 90 tablet 1  . clobetasol ointment (TEMOVATE) 3.00 % Apply 1 application topically 2 (two) times daily. 30 g 2  . diphenhydrAMINE (BENADRYL) 25 mg capsule Take 1 capsule (25 mg total) by mouth every 4 (four) hours as needed for itching. x4 days 30 capsule 0  . docusate sodium (COLACE) 100 MG capsule 1 tab 2 times a day while on narcotics.  STOOL SOFTENER 60 capsule 0  . polyethylene glycol (MIRALAX / GLYCOLAX) packet 17grams in 16 oz of water twice a day until regular bowel  movements.  LAXITIVE.  Restart if two days since last  bowel movement 60 each 0   No current facility-administered medications for this visit.     Objective: There were no vitals taken for this visit. Gen: NAD, alert, cooperative with exam*** HEENT: NCAT, EOMI, PERRL CV: RRR, good S1/S2, no murmur Resp: CTABL, no wheezes, non-labored Abd: Soft, Non Tender, Non Distended, BS present, no guarding or organomegaly Genital Exam: {genitourinary exam:311642::"not done"} Ext: No edema, warm Neuro: Alert and oriented, No gross deficits   Assessment/Plan:  No problem-specific Assessment & Plan notes found for this encounter.   No orders of the defined types were placed in this encounter.   No orders of the defined types were placed in this encounter.    Caroline More, DO, PGY-1 09/21/2017 4:44 PM

## 2017-09-24 ENCOUNTER — Ambulatory Visit: Payer: Medicaid Other | Admitting: Family Medicine

## 2017-10-27 ENCOUNTER — Ambulatory Visit: Payer: Medicaid Other | Admitting: Family Medicine
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
## 2017-11-05 ENCOUNTER — Encounter: Payer: Self-pay | Admitting: Family Medicine

## 2017-11-05 ENCOUNTER — Ambulatory Visit: Payer: Medicaid Other | Admitting: Family Medicine

## 2017-11-05 ENCOUNTER — Other Ambulatory Visit: Payer: Self-pay

## 2017-11-05 VITALS — BP 110/70 | HR 66 | Temp 98.6°F | Ht 61.0 in | Wt 270.0 lb

## 2017-11-05 DIAGNOSIS — Z0000 Encounter for general adult medical examination without abnormal findings: Secondary | ICD-10-CM

## 2017-11-05 DIAGNOSIS — G4700 Insomnia, unspecified: Secondary | ICD-10-CM | POA: Insufficient documentation

## 2017-11-05 DIAGNOSIS — L209 Atopic dermatitis, unspecified: Secondary | ICD-10-CM

## 2017-11-05 DIAGNOSIS — L309 Dermatitis, unspecified: Secondary | ICD-10-CM

## 2017-11-05 DIAGNOSIS — M170 Bilateral primary osteoarthritis of knee: Secondary | ICD-10-CM

## 2017-11-05 LAB — POCT GLYCOSYLATED HEMOGLOBIN (HGB A1C): Hemoglobin A1C: 5.2

## 2017-11-05 MED ORDER — TRIAMCINOLONE ACETONIDE 0.05 % EX OINT: TOPICAL_OINTMENT | CUTANEOUS | 3 refills | Status: DC

## 2017-11-05 NOTE — Assessment & Plan Note (Signed)
Patient very anxious that she has not had blood work in over a year. Explained to patient that previous blood work has been wnl. Patient requesting blood work today to be up to date on labs -ordered CMP, lipid panel, and A1C

## 2017-11-05 NOTE — Assessment & Plan Note (Signed)
-  triamcinolone ointment bid as needed for outbreaks -encouraged to not used scented soaps and to keep skin moist  -follow up as needed

## 2017-11-05 NOTE — Patient Instructions (Signed)
Insomnia Insomnia is a sleep disorder that makes it difficult to fall asleep or to stay asleep. Insomnia can cause tiredness (fatigue), low energy, difficulty concentrating, mood swings, and poor performance at work or school. There are three different ways to classify insomnia:  Difficulty falling asleep.  Difficulty staying asleep.  Waking up too early in the morning.  Any type of insomnia can be long-term (chronic) or short-term (acute). Both are common. Short-term insomnia usually lasts for three months or less. Chronic insomnia occurs at least three times a week for longer than three months. What are the causes? Insomnia may be caused by another condition, situation, or substance, such as:  Anxiety.  Certain medicines.  Gastroesophageal reflux disease (GERD) or other gastrointestinal conditions.  Asthma or other breathing conditions.  Restless legs syndrome, sleep apnea, or other sleep disorders.  Chronic pain.  Menopause. This may include hot flashes.  Stroke.  Abuse of alcohol, tobacco, or illegal drugs.  Depression.  Caffeine.  Neurological disorders, such as Alzheimer disease.  An overactive thyroid (hyperthyroidism).  The cause of insomnia may not be known. What increases the risk? Risk factors for insomnia include:  Gender. Women are more commonly affected than men.  Age. Insomnia is more common as you get older.  Stress. This may involve your professional or personal life.  Income. Insomnia is more common in people with lower income.  Lack of exercise.  Irregular work schedule or night shifts.  Traveling between different time zones.  What are the signs or symptoms? If you have insomnia, trouble falling asleep or trouble staying asleep is the main symptom. This may lead to other symptoms, such as:  Feeling fatigued.  Feeling nervous about going to sleep.  Not feeling rested in the morning.  Having trouble concentrating.  Feeling  irritable, anxious, or depressed.  How is this treated? Treatment for insomnia depends on the cause. If your insomnia is caused by an underlying condition, treatment will focus on addressing the condition. Treatment may also include:  Medicines to help you sleep.  Counseling or therapy.  Lifestyle adjustments.  Follow these instructions at home:  Take medicines only as directed by your health care provider.  Keep regular sleeping and waking hours. Avoid naps.  Keep a sleep diary to help you and your health care provider figure out what could be causing your insomnia. Include: ? When you sleep. ? When you wake up during the night. ? How well you sleep. ? How rested you feel the next day. ? Any side effects of medicines you are taking. ? What you eat and drink.  Make your bedroom a comfortable place where it is easy to fall asleep: ? Put up shades or special blackout curtains to block light from outside. ? Use a white noise machine to block noise. ? Keep the temperature cool.  Exercise regularly as directed by your health care provider. Avoid exercising right before bedtime.  Use relaxation techniques to manage stress. Ask your health care provider to suggest some techniques that may work well for you. These may include: ? Breathing exercises. ? Routines to release muscle tension. ? Visualizing peaceful scenes.  Cut back on alcohol, caffeinated beverages, and cigarettes, especially close to bedtime. These can disrupt your sleep.  Do not overeat or eat spicy foods right before bedtime. This can lead to digestive discomfort that can make it hard for you to sleep.  Limit screen use before bedtime. This includes: ? Watching TV. ? Using your smartphone, tablet, and   computer.  Stick to a routine. This can help you fall asleep faster. Try to do a quiet activity, brush your teeth, and go to bed at the same time each night.  Get out of bed if you are still awake after 15 minutes  of trying to sleep. Keep the lights down, but try reading or doing a quiet activity. When you feel sleepy, go back to bed.  Make sure that you drive carefully. Avoid driving if you feel very sleepy.  Keep all follow-up appointments as directed by your health care provider. This is important. Contact a health care provider if:  You are tired throughout the day or have trouble in your daily routine due to sleepiness.  You continue to have sleep problems or your sleep problems get worse. Get help right away if:  You have serious thoughts about hurting yourself or someone else. This information is not intended to replace advice given to you by your health care provider. Make sure you discuss any questions you have with your health care provider. Document Released: 09/25/2000 Document Revised: 02/28/2016 Document Reviewed: 06/29/2014 Elsevier Interactive Patient Education  [employer].   It was a pleasure seeing you today.   Today we discussed your handicap sticker, eczema, and insomnia  For your eczema: I have prescribed you triamcinolone. Use this twice a day to the affected area.   For your insomnia: please keep good "sleep hygiene". This involves associating the bed with sleep and making sure after 20 min of not being able to fall asleep, get up and move away from the bed. Then try coming back to the bed and sleeping. Keep the room at a cool temperature, dark, and silent. Please avoid screen 2 hours prior to bed. Avoid exercise and large meals 2 hours prior to bed. If you ever want to talk to behavioral health or need an appointment for anxiety please feel free to contact the clinic and make an appointment.   Please follow up in 2 weeks or sooner if symptoms persist or worsen. Please call the clinic immediately if you have any concerns.   Our clinic's number is [phone]. Please call with questions or concerns.   Thank you,  Caroline More, DO

## 2017-11-05 NOTE — Assessment & Plan Note (Signed)
Likely 2/2 stress. Have encouraged behavioral health follow up but patient refused.  -encouraged good "sleep hygiene". Have discussed good sleep hygiene techniques with patient and patient is willing to try these techniques for 2 weeks to see if any benefit  -follow up in 2 weeks

## 2017-11-05 NOTE — Assessment & Plan Note (Signed)
-  Continue to follow orthopedics -Continue home exercises  -Have renewed Handicap placard  -follow up as needed

## 2017-11-06 LAB — COMPREHENSIVE METABOLIC PANEL
ALBUMIN: 4.2 g/dL (ref 3.5–5.5)
ALT: 11 IU/L (ref 0–32)
AST: 17 IU/L (ref 0–40)
Albumin/Globulin Ratio: 1.1 — ABNORMAL LOW (ref 1.2–2.2)
Alkaline Phosphatase: 156 IU/L — ABNORMAL HIGH (ref 39–117)
BUN / CREAT RATIO: 21 (ref 9–23)
BUN: 13 mg/dL (ref 6–24)
CHLORIDE: 105 mmol/L (ref 96–106)
CO2: 18 mmol/L — ABNORMAL LOW (ref 20–29)
Calcium: 9.4 mg/dL (ref 8.7–10.2)
Creatinine, Ser: 0.62 mg/dL (ref 0.57–1.00)
GFR calc Af Amer: 117 mL/min/{1.73_m2} (ref >59)
GFR calc non Af Amer: 102 mL/min/{1.73_m2} (ref >59)
GLOBULIN, TOTAL: 4 g/dL (ref 1.5–4.5)
GLUCOSE: 92 mg/dL (ref 65–99)
Potassium: 5.2 mmol/L (ref 3.5–5.2)
SODIUM: 144 mmol/L (ref 134–144)
Total Protein: 8.2 g/dL (ref 6.0–8.5)

## 2017-11-06 LAB — LIPID PANEL
Chol/HDL Ratio: 3.4 ratio (ref 0.0–4.4)
Cholesterol, Total: 188 mg/dL (ref 100–199)
HDL: 56 mg/dL (ref >39)
LDL CALC: 117 mg/dL — AB (ref 0–99)
Triglycerides: 74 mg/dL (ref 0–149)
VLDL CHOLESTEROL CAL: 15 mg/dL (ref 5–40)

## 2017-11-07 ENCOUNTER — Encounter: Payer: Self-pay | Admitting: Family Medicine

## 2017-11-08 ENCOUNTER — Telehealth: Payer: Self-pay | Admitting: *Deleted

## 2017-11-08 NOTE — Telephone Encounter (Signed)
Received fax from [facility].  The only form of Triamcinolone that is 0.05% which is Trianex which is not covered by medicaid.  Covered Dosages are 0.025%, 0.1% and 0.5%.  Will forward to MD or complete PA which was placed in her box. Shealeigh Dunstan, Salome Spotted, CMA

## 2017-11-10 ENCOUNTER — Other Ambulatory Visit: Payer: Self-pay | Admitting: Family Medicine

## 2017-11-10 DIAGNOSIS — L209 Atopic dermatitis, unspecified: Secondary | ICD-10-CM

## 2017-11-10 MED ORDER — TRIAMCINOLONE ACETONIDE 0.025 % EX OINT: 1.0000 "application " | TOPICAL_OINTMENT | Freq: Two times a day (BID) | CUTANEOUS | 0 refills | Status: DC

## 2017-11-10 NOTE — Telephone Encounter (Signed)
lmovm informing patient. April [name], April [name], CMA  

## 2017-11-10 NOTE — Telephone Encounter (Signed)
Have e-prescribed prescription for 0.025% as this is a covered dosage. If this does not work I will re-prescribe higher strength dose.   Thank you,  Caroline More, DO

## 2017-12-18 ENCOUNTER — Encounter (HOSPITAL_COMMUNITY): Payer: Self-pay | Admitting: Emergency Medicine

## 2017-12-18 ENCOUNTER — Emergency Department (HOSPITAL_COMMUNITY)
Admission: EM | Admit: 2017-12-18 | Discharge: 2017-12-18 | Disposition: A | Discharge status: Home / Self Care | Payer: Medicaid Other | Attending: Emergency Medicine | Admitting: Emergency Medicine

## 2017-12-18 DIAGNOSIS — R05 Cough: Secondary | ICD-10-CM | POA: Diagnosis present

## 2017-12-18 DIAGNOSIS — H9202 Otalgia, left ear: Secondary | ICD-10-CM | POA: Insufficient documentation

## 2017-12-18 DIAGNOSIS — R51 Headache: Secondary | ICD-10-CM | POA: Diagnosis not present

## 2017-12-18 DIAGNOSIS — J40 Bronchitis, not specified as acute or chronic: Secondary | ICD-10-CM | POA: Diagnosis not present

## 2017-12-18 DIAGNOSIS — R0981 Nasal congestion: Secondary | ICD-10-CM | POA: Diagnosis not present

## 2017-12-18 DIAGNOSIS — J029 Acute pharyngitis, unspecified: Secondary | ICD-10-CM | POA: Diagnosis not present

## 2017-12-18 DIAGNOSIS — R591 Generalized enlarged lymph nodes: Secondary | ICD-10-CM | POA: Insufficient documentation

## 2017-12-18 LAB — RAPID STREP SCREEN (MED CTR MEBANE ONLY): STREPTOCOCCUS, GROUP A SCREEN (DIRECT): NEGATIVE

## 2017-12-18 MED ORDER — BENZONATATE 100 MG PO CAPS: 100.0000 mg | ORAL_CAPSULE | Freq: Three times a day (TID) | ORAL | 0 refills | Status: DC

## 2017-12-18 MED ORDER — AZITHROMYCIN 250 MG PO TABS: ORAL_TABLET | ORAL | 0 refills | Status: DC
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
## 2017-12-18 NOTE — ED Triage Notes (Signed)
Patient c/o sore throat, ear pain, congestion that started at beginning of the week and been taking OTC sinus medications but still persistent. Patient c/o sore throat with swallowing.

## 2017-12-18 NOTE — Discharge Instructions (Signed)
Take tylenol as needed for pain. Follow up with your primary care provider or return here for worsening symptoms.

## 2017-12-20 ENCOUNTER — Encounter (HOSPITAL_COMMUNITY): Payer: Self-pay

## 2017-12-20 ENCOUNTER — Other Ambulatory Visit: Payer: Self-pay

## 2017-12-20 ENCOUNTER — Emergency Department (HOSPITAL_COMMUNITY)
Admission: EM | Admit: 2017-12-20 | Discharge: 2017-12-20 | Disposition: A | Discharge status: Home / Self Care | Payer: Medicaid Other | Attending: Emergency Medicine | Admitting: Emergency Medicine

## 2017-12-20 DIAGNOSIS — Z87891 Personal history of nicotine dependence: Secondary | ICD-10-CM | POA: Diagnosis not present

## 2017-12-20 DIAGNOSIS — J029 Acute pharyngitis, unspecified: Secondary | ICD-10-CM | POA: Insufficient documentation

## 2017-12-20 DIAGNOSIS — R0981 Nasal congestion: Secondary | ICD-10-CM | POA: Diagnosis not present

## 2017-12-20 LAB — RAPID STREP SCREEN (MED CTR MEBANE ONLY): Streptococcus, Group A Screen (Direct): NEGATIVE

## 2017-12-20 MED ORDER — LIDOCAINE VISCOUS 2 % MT SOLN: 15.0000 mL | OROMUCOSAL | 0 refills | Status: DC | PRN

## 2017-12-20 MED ORDER — LIDOCAINE VISCOUS 2 % MT SOLN
20.0000 mL | Freq: Once | OROMUCOSAL | Status: AC
Start: 2017-12-20 — End: 2017-12-20
  Administered 2017-12-20: 15 mL via OROMUCOSAL
  Filled 2017-12-20: qty 30

## 2017-12-20 NOTE — Discharge Instructions (Signed)
Please follow with your primary care doctor in the next 2 days for a check-up. They must obtain records for further management.  ° °Do not hesitate to return to the Emergency Department for any new, worsening or concerning symptoms.  ° °
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
## 2017-12-21 LAB — CULTURE, GROUP A STREP (THRC)

## 2017-12-22 LAB — CULTURE, GROUP A STREP (THRC)

## 2018-03-02 ENCOUNTER — Ambulatory Visit: Payer: Medicaid Other | Admitting: Family Medicine

## 2018-03-02 ENCOUNTER — Encounter: Payer: Self-pay | Admitting: Family Medicine

## 2018-03-02 ENCOUNTER — Other Ambulatory Visit: Payer: Self-pay

## 2018-03-02 VITALS — BP 118/68 | HR 75 | Temp 99.1°F | Ht 61.0 in | Wt 261.0 lb

## 2018-03-02 DIAGNOSIS — Z124 Encounter for screening for malignant neoplasm of cervix: Secondary | ICD-10-CM | POA: Diagnosis present

## 2018-03-02 DIAGNOSIS — L80 Vitiligo: Secondary | ICD-10-CM | POA: Diagnosis not present

## 2018-03-02 DIAGNOSIS — Z1159 Encounter for screening for other viral diseases: Secondary | ICD-10-CM | POA: Diagnosis not present

## 2018-03-02 DIAGNOSIS — Z1231 Encounter for screening mammogram for malignant neoplasm of breast: Secondary | ICD-10-CM

## 2018-03-02 DIAGNOSIS — E669 Obesity, unspecified: Secondary | ICD-10-CM | POA: Diagnosis not present

## 2018-03-02 DIAGNOSIS — Z1239 Encounter for other screening for malignant neoplasm of breast: Secondary | ICD-10-CM

## 2018-03-02 DIAGNOSIS — Z114 Encounter for screening for human immunodeficiency virus [HIV]: Secondary | ICD-10-CM | POA: Diagnosis not present

## 2018-03-02 DIAGNOSIS — Z23 Encounter for immunization: Secondary | ICD-10-CM

## 2018-03-02 DIAGNOSIS — Z0100 Encounter for examination of eyes and vision without abnormal findings: Secondary | ICD-10-CM

## 2018-03-02 DIAGNOSIS — Z0000 Encounter for general adult medical examination without abnormal findings: Secondary | ICD-10-CM

## 2018-03-02 NOTE — Progress Notes (Signed)
Subjective:    Patient ID: April [name], female    DOB: November 23, 1960, 57 y.o.   MRN: [id number]   CC: Skin changes and Pap Smear  HPI: Pap Smear  Patient not having periods. Menopause occurred 5 years ago  She not currently sexually active, but plans to be soon  Contraceptive needs: none Pelvic issues such as pain, discharge, abnormal bleeding? no Smoker?   no Any particular pertinent personal medical history?  Patient had Partial hysterectomy in 2011. States she still has a cervix and had a recent pap in 2016 which was normal.   Skin changes Patient reports skin changes in labial region. Unsure when it started as she normally does not pay attention to labial region during daily hygiene but states she started to notice these changes 1 month ago. States the area is hypopigmented and only on labia slightly radiating to anus without skin changes anywhere else on body. Patient has h/o eczema but no other dermatologic history. Denies pain but states it sometimes itches. Uses fragrant soaps. Uses cotton panties without pads. Patient is not sexually active. S/p menapause 5 years ago and s/p partial hysterectomy in 2011. Denies any abnormal bleeding or discharge. Denies douching area. Denies using any unusual creams.     Objective:  BP 118/68   Pulse 75   Temp 99.1 F (37.3 C) (Oral)   Ht 5\' 1"  (1.549 m)   Wt 261 lb (118.4 kg)   SpO2 97%   BMI 49.32 kg/m  Vitals and nursing note reviewed  General: well nourished, in no acute distress HEENT: normocephalic, no scleral icterus or conjunctival pallor, no nasal discharge, moist mucous membranes Cardiac: RRR, clear S1 and S2, no murmurs, rubs, or gallops Respiratory: clear to auscultation bilaterally, no increased work of breathing Abdomen: soft, nontender, nondistended, no masses or organomegaly. Bowel sounds present Extremities: no edema or cyanosis. Warm, well perfused. 2+ radial pulses bilaterally Skin: warm and dry, no rashes  noted Neuro: alert and oriented, no focal deficits GU: Female genitalia: not done Vulva: not indicated and vulvar hypopigmentation areas of no pigmentation without discrete borders, no erythema, no significant tenderness Vagina: normal appearing vagina with normal color and discharge, no lesions Cervix: surgically absent  Assessment & Plan:    Vitiligo Area of hypopigmentation on vulvar region appears to consistent with vitiligo. Areas have no distinct borders and has no pigmentation. Although we would not expect such sudden changes (within 1 month), likely that changes have been occurring for some time and patient just noticed them. Patient does not report pain. Area has some itching but likely from fragrant soaps. Advised patient to discontinue fragrant soaps, especially given h/o eczema, and encouraged the use of sensitive-fragrant free soaps only.  -continue to monitor -if area continues to itch despite discontinuation of fragrant soaps advised patient to follow up in 1 month in dermatology clinic  -if worsening will need to follow up in dermatology clinic  Precepted with Dr. Erin Hearing   Encounter for Papanicolaou smear of cervix Patient presenting for update of Pap smear. Patient had recent pap smear in 2016 that was normal. When examining both with speculum and bimanual exam, no cervix was able to be found. Dr. Erin Hearing was called and he was unable to find cervix on bimanual exam as well. Per chart review hysterectomy was written to be supracervical and patient did not recent pap in 2016. As patient had pap smear in 2011, prior to [place], unable to find actual operative report. CT Abdomen/Pelvis in  January 2016 did not show cervix on imaging. Unclear if cervix was removed during hysterectomy or possible involution of cervical cuff after hysterectomy. Still unclear if cervix was removed during hysterectomy in 2011, however records in [place] report it was supracervical and patient did have recent  Pap smear in 2016.  -instructed patient to contact OB to have records sent of hysterectomy -have removed pap smears off patient's healthcare maintenance requirements   Precepted with Dr. Erin Hearing   Morbid obesity [facility]) Patient requesting referral to healthy weight a wellness, a weight loss program and [employer] [facility].  -Referral placed and instructed patient to call program and have them contact me directly with anything else they would need  Healthcare maintenance -patient due for hep C testing. Patient is not high risk. Patient not sexually active and does not have PMHx of risky sexual behavior. No h/o IV drug use or exposure to Hep C.  -patient due for HIV screening. Patient is not high risk. Patient not sexually active and does not have PMHx of risky sexual behavior. No h/o IV drug use or exposure to HIV.  -patient due for mammogram. Order placed for referral to [facility] breast center and paper with instructions on how to schedule appointment given to patient. Patient aware and stated she will make appointment.  -Patient due for Tdap vaccine. Tdap vaccine given today    Return in about 1 month (around 04/02/2018) for in dermatology clinic if worsening or no improvement in itching.   Caroline More, DO, PGY-1

## 2018-03-02 NOTE — Patient Instructions (Signed)
It was a pleasure seeing you today.   Today we discussed you skin changes  For your skin: please use non scented soaps. If the area still bothers you please follow up in 1 month in the [facility].   Please ask your OB to send [place] your surgical records.   I have referred you to an eye doctor and mammogram. I have also referred you to weight management. Please call the number and have them contact me directly if they need anything else.   Please follow up in 1 month or sooner if symptoms persist or worsen. Please call the clinic immediately if you have any concerns.   Our clinic's number is [phone]. Please call with questions or concerns.   Thank you,  Caroline More, DO

## 2018-03-03 ENCOUNTER — Encounter: Payer: Self-pay | Admitting: Family Medicine

## 2018-03-03 DIAGNOSIS — L80 Vitiligo: Secondary | ICD-10-CM

## 2018-03-03 DIAGNOSIS — Z124 Encounter for screening for malignant neoplasm of cervix: Secondary | ICD-10-CM | POA: Insufficient documentation

## 2018-03-03 HISTORY — DX: Vitiligo: L80

## 2018-03-03 LAB — HEPATITIS C ANTIBODY (REFLEX)

## 2018-03-03 LAB — HIV ANTIBODY (ROUTINE TESTING W REFLEX): HIV Screen 4th Generation wRfx: NONREACTIVE

## 2018-03-03 LAB — HCV COMMENT:

## 2018-03-03 NOTE — Assessment & Plan Note (Signed)
Area of hypopigmentation on vulvar region appears to consistent with vitiligo. Areas have no distinct borders and has no pigmentation. Although we would not expect such sudden changes (within 1 month), likely that changes have been occurring for some time and patient just noticed them. Patient does not report pain. Area has some itching but likely from fragrant soaps. Advised patient to discontinue fragrant soaps, especially given h/o eczema, and encouraged the use of sensitive-fragrant free soaps only.  -continue to monitor -if area continues to itch despite discontinuation of fragrant soaps advised patient to follow up in 1 month in dermatology clinic  -if worsening will need to follow up in dermatology clinic  Precepted with Dr. Erin Hearing

## 2018-03-03 NOTE — Assessment & Plan Note (Signed)
Patient presenting for update of Pap smear. Patient had recent pap smear in 2016 that was normal. When examining both with speculum and bimanual exam, no cervix was able to be found. Dr. Erin Hearing was called and he was unable to find cervix on bimanual exam as well. Per chart review hysterectomy was written to be supracervical and patient did not recent pap in 2016. As patient had pap smear in 2011, prior to [place], unable to find actual operative report. CT Abdomen/Pelvis in January 2016 did not show cervix on imaging. Unclear if cervix was removed during hysterectomy or possible involution of cervical cuff after hysterectomy. Still unclear if cervix was removed during hysterectomy in 2011, however records in [place] report it was supracervical and patient did have recent Pap smear in 2016.  -instructed patient to contact OB to have records sent of hysterectomy -have removed pap smears off patient's healthcare maintenance requirements   Precepted with Dr. Erin Hearing

## 2018-03-03 NOTE — Assessment & Plan Note (Signed)
-  patient due for hep C testing. Patient is not high risk. Patient not sexually active and does not have PMHx of risky sexual behavior. No h/o IV drug use or exposure to Hep C.  -patient due for HIV screening. Patient is not high risk. Patient not sexually active and does not have PMHx of risky sexual behavior. No h/o IV drug use or exposure to HIV.  -patient due for mammogram. Order placed for referral to [facility] breast center and paper with instructions on how to schedule appointment given to patient. Patient aware and stated she will make appointment.  -Patient due for Tdap vaccine. Tdap vaccine given today

## 2018-03-03 NOTE — Assessment & Plan Note (Signed)
Patient requesting referral to healthy weight a wellness, a weight loss program and [facility] system.  -Referral placed and instructed patient to call program and have them contact me directly with anything else they would need

## 2018-03-09 ENCOUNTER — Telehealth: Payer: Self-pay

## 2018-03-09 NOTE — Telephone Encounter (Signed)
Called patient to discuss surgical history. I have not received any information from OB. Patient will attempt to contact OB/GYN to send [facility] OB surgical history.  Dalphine Handing, PGY-1 [facility] 03/09/2018 2:33 PM

## 2018-03-09 NOTE — Telephone Encounter (Signed)
Pt left message on nurse line, wanting to know if we ever got any information on the surgery she had and discussed at [facility]. Call back [phone] Wallace Cullens, RN

## 2018-03-17 ENCOUNTER — Encounter (INDEPENDENT_AMBULATORY_CARE_PROVIDER_SITE_OTHER): Payer: Self-pay

## 2018-03-21 ENCOUNTER — Ambulatory Visit (INDEPENDENT_AMBULATORY_CARE_PROVIDER_SITE_OTHER): Payer: Self-pay | Admitting: Family Medicine

## 2018-04-20 ENCOUNTER — Ambulatory Visit
Admission: RE | Admit: 2018-04-20 | Discharge: 2018-04-20 | Disposition: A | Discharge status: Home / Self Care | Payer: Medicaid Other | Source: Ambulatory Visit | Attending: Family Medicine | Admitting: Family Medicine

## 2018-04-20 DIAGNOSIS — Z1239 Encounter for other screening for malignant neoplasm of breast: Secondary | ICD-10-CM

## 2018-04-20 DIAGNOSIS — Z1231 Encounter for screening mammogram for malignant neoplasm of breast: Secondary | ICD-10-CM | POA: Diagnosis not present

## 2018-04-26 DIAGNOSIS — H40001 Preglaucoma, unspecified, right eye: Secondary | ICD-10-CM | POA: Diagnosis not present

## 2018-04-26 DIAGNOSIS — H538 Other visual disturbances: Secondary | ICD-10-CM | POA: Diagnosis not present

## 2018-05-12 DIAGNOSIS — H5213 Myopia, bilateral: Secondary | ICD-10-CM | POA: Diagnosis not present

## 2018-05-16 ENCOUNTER — Encounter (INDEPENDENT_AMBULATORY_CARE_PROVIDER_SITE_OTHER): Payer: Self-pay

## 2018-05-25 ENCOUNTER — Ambulatory Visit (INDEPENDENT_AMBULATORY_CARE_PROVIDER_SITE_OTHER): Payer: Self-pay | Admitting: Family Medicine

## 2018-06-01 ENCOUNTER — Encounter: Payer: Self-pay | Admitting: Family Medicine

## 2018-06-01 ENCOUNTER — Other Ambulatory Visit: Payer: Self-pay

## 2018-06-01 ENCOUNTER — Ambulatory Visit: Payer: Medicaid Other | Admitting: Family Medicine

## 2018-06-01 VITALS — BP 122/70 | HR 68 | Temp 98.6°F | Ht 61.0 in | Wt 262.0 lb

## 2018-06-01 DIAGNOSIS — Z0000 Encounter for general adult medical examination without abnormal findings: Secondary | ICD-10-CM | POA: Diagnosis not present

## 2018-06-01 DIAGNOSIS — L209 Atopic dermatitis, unspecified: Secondary | ICD-10-CM

## 2018-06-01 DIAGNOSIS — L309 Dermatitis, unspecified: Secondary | ICD-10-CM | POA: Diagnosis not present

## 2018-06-01 DIAGNOSIS — R5383 Other fatigue: Secondary | ICD-10-CM

## 2018-06-01 DIAGNOSIS — Z124 Encounter for screening for malignant neoplasm of cervix: Secondary | ICD-10-CM

## 2018-06-01 MED ORDER — TRIAMCINOLONE ACETONIDE 0.025 % EX OINT: 1.0000 "application " | TOPICAL_OINTMENT | Freq: Two times a day (BID) | CUTANEOUS | 3 refills | Status: DC

## 2018-06-01 NOTE — Assessment & Plan Note (Addendum)
Patient presenting for follow-up regarding Pap smear.  Patient was recently seen on 02/2018 and at that time bimanual and speculum exam were unable to find cervix.  This exam was attempted by both me and the attending Dr. Erin Hearing at that time.  Patient today bringing in the records from her gynecologic surgeon.  Patient had a supracervical hysterectomy on 06/10/2010.  Per chart review anterior cervix was unable to be removed.  At that point surgery was stopped and a supracervical hysterectomy was performed.  A portion of the cervix was removed posteriorly.  Given that portion of cervix is still remaining and there is still a chance that cervical cancer could arise, discussed options of gynecology referral with patient.  Patient states that she does not have a past medical history of abnormal Pap smears.  Options of referral to gynecology for Pap smear versus not having Pap smears in the future discussed with patient.  Patient states that she would like to the on the safe side and have a referral to gynecology.  This information was discussed with patient over the phone after I reviewed her records sent from gynecologic surgeon. -Referral to gynecology placed -Patient should follow-up with gynecology for future Pap smears

## 2018-06-01 NOTE — Assessment & Plan Note (Signed)
Chronic but worsening.  Can consider hypothyroidism so will obtain TSH as well as free T4 and free T3.  Can consider anemia so will obtain CBC.  Possibly related to depression.  Discussed this with patient.  If labs are abnormal patient will agree to follow-up discuss other causes such as depression. -Follow-up in 1 month

## 2018-06-01 NOTE — Progress Notes (Signed)
Subjective:    Patient ID: April [name], female    DOB: 21-Sep-1961, 57 y.o.   MRN: [id number]   CC: f/u on pap smear, med refill, psychology referral, low energy  HPI: Pap smear follow up Presenting today for follow-up regarding inability to perform Pap smear in May.  At that time patient was seen and cervix was unable to be found on both bimanual and speculum exam.  Chart review at that time could not find surgical records as he occurred before electronic EMR was available.  CT abdomen pelvis from 2016 at that time did not show cervix on imaging.  Patient brought in records today from gynecologic surgeon showing that surgery was supracervical.  Her surgical records anterior cervix was unable to be removed, however, a portion of the posterior cervix was removed.  Med refill Sent today requesting refill of triamcinolone cream.  She uses this for her eczema.  Psychology referral  Patient today requesting if I know any information on local psychologist accepting new patients.  Patient states that referral is not for her, however, she has a friend that would like to talk to psychology and just was unsure of where to go in [place].  Low energy Patient today reporting that she has low energy.  States that she has been trying to lose weight but notes that having fatigue makes it difficult.  Patient has met with the [facility] weight loss program but states that they are requiring $100 an appointment which she cannot afford.  Patient used to go to [facility] 3 times a week which then decreased to 1 time a week and now goes even less.  States that she did not renew her membership as she wanted to try a different gym.  Patient just has not found a new gym to go to.  Patient states that she used to walk around a lot but because of the heat has not been able to walk as much.  Patient states that she has been working on her diet somewhat.  Has cut out breads, but when her daughter is home from college  notes she eats more.  Usually eats one meal a day and other meals are either smoothies or fruit.  Does not note any weight changes.  Denies any palpitations.  Denies any family history or personal history of thyroid disease.  Denies any hair or skin changes.   Objective:  BP 122/70   Pulse 68   Temp 98.6 F (37 C) (Oral)   Ht _0  (1.549 m)   Wt 262 lb (118.8 kg)   SpO2 98%   BMI 49.50 kg/m  Vitals and nursing note reviewed  General: well nourished, in no acute distress HEENT: normocephalic, no scleral icterus or conjunctival pallor, no nasal discharge, moist mucous membranes, good dentition without erythema or discharge noted in posterior oropharynx Neck: supple, non-tender, without lymphadenopathy, no thyromegaly  Cardiac: RRR, clear S1 and S2, no murmurs, rubs, or gallops Respiratory: clear to auscultation bilaterally, no increased work of breathing Abdomen: soft, nontender, nondistended, no masses or organomegaly. Bowel sounds present Extremities: no edema or cyanosis. Warm, well perfused. 2+ radial and PT pulses bilaterally Skin: warm and dry, no rashes noted Neuro: alert and oriented, no focal deficits   Assessment & Plan:    Encounter for Papanicolaou smear of cervix Patient presenting for follow-up regarding Pap smear.  Patient was recently seen on 02/2018 and at that time bimanual and speculum exam were unable to find cervix.  This exam was attempted by both me and the attending Dr. Erin Hearing at that time.  Patient today bringing in the records from her gynecologic surgeon.  Patient had a supracervical hysterectomy on 06/10/2010.  Per chart review anterior cervix was unable to be removed.  At that point surgery was stopped and a supracervical hysterectomy was performed.  A portion of the cervix was removed posteriorly.  Given that portion of cervix is still remaining and there is still a chance that cervical cancer could arise, discussed options of gynecology referral with  patient.  Patient states that she does not have a past medical history of abnormal Pap smears.  Options of referral to gynecology for Pap smear versus not having Pap smears in the future discussed with patient.  Patient states that she would like to the on the safe side and have a referral to gynecology.  This information was discussed with patient over the phone after I reviewed her records sent from gynecologic surgeon. -Referral to gynecology placed -Patient should follow-up with gynecology for future Pap smears  Healthcare maintenance Paper with local psychologist accepting Medicaid patients given to patient.  Discussed behavioral health resources in family medicine center as well.  Patient states that she is aware of behavioral health resources and if she feels that she needs any psychology referral she will follow-up.  Patient states that she does not need these referrals at this time and was just looking for resources for her friend.  Eczema Refilled triamcinolone ointment and gave 3 refills.  Fatigue Chronic but worsening.  Can consider hypothyroidism so will obtain TSH as well as free T4 and free T3.  Can consider anemia so will obtain CBC.  Possibly related to depression.  Discussed this with patient.  If labs are abnormal patient will agree to follow-up discuss other causes such as depression. -Follow-up in 1 month    Return in about 4 weeks (around 06/29/2018), or if symptoms worsen or fail to improve.   Caroline More, DO, PGY-2    ;

## 2018-06-01 NOTE — Patient Instructions (Signed)
Calorie Counting for Weight Loss Calories are units of energy. Your body needs a certain amount of calories from food to keep you going throughout the day. When you eat more calories than your body needs, your body stores the extra calories as fat. When you eat fewer calories than your body needs, your body burns fat to get the energy it needs. Calorie counting means keeping track of how many calories you eat and drink each day. Calorie counting can be helpful if you need to lose weight. If you make sure to eat fewer calories than your body needs, you should lose weight. Ask your health care provider what a healthy weight is for you. For calorie counting to work, you will need to eat the right number of calories in a day in order to lose a healthy amount of weight per week. A dietitian can help you determine how many calories you need in a day and will give you suggestions on how to reach your calorie goal.  A healthy amount of weight to lose per week is usually 1-2 lb (0.5-0.9 kg). This usually means that your daily calorie intake should be reduced by 500-750 calories.  Eating 1,200 - 1,500 calories per day can help most women lose weight.  Eating 1,500 - 1,800 calories per day can help most men lose weight.  What is my plan? My goal is to have __________ calories per day. If I have this many calories per day, I should lose around __________ pounds per week. What do I need to know about calorie counting? In order to meet your daily calorie goal, you will need to:  Find out how many calories are in each food you would like to eat. Try to do this before you eat.  Decide how much of the food you plan to eat.  Write down what you ate and how many calories it had. Doing this is called keeping a food log.  To successfully lose weight, it is important to balance calorie counting with a healthy lifestyle that includes regular activity. Aim for 150 minutes of moderate exercise (such as walking) or 75  minutes of vigorous exercise (such as running) each week. Where do I find calorie information?  The number of calories in a food can be found on a Nutrition Facts label. If a food does not have a Nutrition Facts label, try to look up the calories online or ask your dietitian for help. Remember that calories are listed per serving. If you choose to have more than one serving of a food, you will have to multiply the calories per serving by the amount of servings you plan to eat. For example, the label on a package of bread might say that a serving size is 1 slice and that there are 90 calories in a serving. If you eat 1 slice, you will have eaten 90 calories. If you eat 2 slices, you will have eaten 180 calories. How do I keep a food log? Immediately after each meal, record the following information in your food log:  What you ate. Don't forget to include toppings, sauces, and other extras on the food.  How much you ate. This can be measured in cups, ounces, or number of items.  How many calories each food and drink had.  The total number of calories in the meal.  Keep your food log near you, such as in a small notebook in your pocket, or use a mobile app or website. Some   programs will calculate calories for you and show you how many calories you have left for the day to meet your goal. What are some calorie counting tips?  Use your calories on foods and drinks that will fill you up and not leave you hungry: ? Some examples of foods that fill you up are nuts and nut butters, vegetables, lean proteins, and high-fiber foods like whole grains. High-fiber foods are foods with more than 5 g fiber per serving. ? Drinks such as sodas, specialty coffee drinks, alcohol, and juices have a lot of calories, yet do not fill you up.  Eat nutritious foods and avoid empty calories. Empty calories are calories you get from foods or beverages that do not have many vitamins or protein, such as candy, sweets, and  soda. It is better to have a nutritious high-calorie food (such as an avocado) than a food with few nutrients (such as a bag of chips).  Know how many calories are in the foods you eat most often. This will help you calculate calorie counts faster.  Pay attention to calories in drinks. Low-calorie drinks include water and unsweetened drinks.  Pay attention to nutrition labels for "low fat" or "fat free" foods. These foods sometimes have the same amount of calories or more calories than the full fat versions. They also often have added sugar, starch, or salt, to make up for flavor that was removed with the fat.  Find a way of tracking calories that works for you. Get creative. Try different apps or programs if writing down calories does not work for you. What are some portion control tips?  Know how many calories are in a serving. This will help you know how many servings of a certain food you can have.  Use a measuring cup to measure serving sizes. You could also try weighing out portions on a kitchen scale. With time, you will be able to estimate serving sizes for some foods.  Take some time to put servings of different foods on your favorite plates, bowls, and cups so you know what a serving looks like.  Try not to eat straight from a bag or box. Doing this can lead to overeating. Put the amount you would like to eat in a cup or on a plate to make sure you are eating the right portion.  Use smaller plates, glasses, and bowls to prevent overeating.  Try not to multitask (for example, watch TV or use your computer) while eating. If it is time to eat, sit down at a table and enjoy your food. This will help you to know when you are full. It will also help you to be aware of what you are eating and how much you are eating. What are tips for following this plan? Reading food labels  Check the calorie count compared to the serving size. The serving size may be smaller than what you are used to  eating.  Check the source of the calories. Make sure the food you are eating is high in vitamins and protein and low in saturated and trans fats. Shopping  Read nutrition labels while you shop. This will help you make healthy decisions before you decide to purchase your food.  Make a grocery list and stick to it. Cooking  Try to cook your favorite foods in a healthier way. For example, try baking instead of frying.  Use low-fat dairy products. Meal planning  Use more fruits and vegetables. Half of your plate should   be fruits and vegetables.  Include lean proteins like poultry and fish. How do I count calories when eating out?  Ask for smaller portion sizes.  Consider sharing an entree and sides instead of getting your own entree.  If you get your own entree, eat only half. Ask for a box at the beginning of your meal and put the rest of your entree in it so you are not tempted to eat it.  If calories are listed on the menu, choose the lower calorie options.  Choose dishes that include vegetables, fruits, whole grains, low-fat dairy products, and lean protein.  Choose items that are boiled, broiled, grilled, or steamed. Stay away from items that are buttered, battered, fried, or served with cream sauce. Items labeled "crispy" are usually fried, unless stated otherwise.  Choose water, low-fat milk, unsweetened iced tea, or other drinks without added sugar. If you want an alcoholic beverage, choose a lower calorie option such as a glass of wine or light beer.  Ask for dressings, sauces, and syrups on the side. These are usually high in calories, so you should limit the amount you eat.  If you want a salad, choose a garden salad and ask for grilled meats. Avoid extra toppings like bacon, cheese, or fried items. Ask for the dressing on the side, or ask for olive oil and vinegar or lemon to use as dressing.  Estimate how many servings of a food you are given. For example, a serving of  cooked rice is  cup or about the size of half a baseball. Knowing serving sizes will help you be aware of how much food you are eating at restaurants. The list below tells you how big or small some common portion sizes are based on everyday objects: ? 1 oz-4 stacked dice. ? 3 oz-1 deck of cards. ? 1 tsp-1 die. ? 1 Tbsp- a ping-pong ball. ? 2 Tbsp-1 ping-pong ball. ?  cup- baseball. ? 1 cup-1 baseball. Summary  Calorie counting means keeping track of how many calories you eat and drink each day. If you eat fewer calories than your body needs, you should lose weight.  A healthy amount of weight to lose per week is usually 1-2 lb (0.5-0.9 kg). This usually means reducing your daily calorie intake by 500-750 calories.  The number of calories in a food can be found on a Nutrition Facts label. If a food does not have a Nutrition Facts label, try to look up the calories online or ask your dietitian for help.  Use your calories on foods and drinks that will fill you up, and not on foods and drinks that will leave you hungry.  Use smaller plates, glasses, and bowls to prevent overeating. This information is not intended to replace advice given to you by your health care provider. Make sure you discuss any questions you have with your health care provider. Document Released: 09/28/2005 Document Revised: 08/28/2016 Document Reviewed: 08/28/2016 Elsevier Interactive Patient Education  [employer].  It was a pleasure seeing you today.   Today we discussed your fatigue  For your fatigue: I will obtain blood work. I will either call or send a letter with results.   Please follow up in 1 month if no improvement or sooner if symptoms persist or worsen. Please call the clinic immediately if you have any concerns.   Our clinic's number is [phone]. Please call with questions or concerns.    Thank you,  Caroline More, DO

## 2018-06-01 NOTE — Assessment & Plan Note (Signed)
Refilled triamcinolone ointment and gave 3 refills.

## 2018-06-01 NOTE — Assessment & Plan Note (Signed)
Paper with local psychologist accepting Medicaid patients given to patient.  Discussed behavioral health resources in [facility] as well.  Patient states that she is aware of behavioral health resources and if she feels that she needs any psychology referral she will follow-up.  Patient states that she does not need these referrals at this time and was just looking for resources for her friend.

## 2018-06-02 LAB — CBC
HEMATOCRIT: 41.3 % (ref 34.0–46.6)
Hemoglobin: 13.5 g/dL (ref 11.1–15.9)
MCH: 27.4 pg (ref 26.6–33.0)
MCHC: 32.7 g/dL (ref 31.5–35.7)
MCV: 84 fL (ref 79–97)
Platelets: 345 10*3/uL (ref 150–450)
RBC: 4.92 x10E6/uL (ref 3.77–5.28)
RDW: 15.1 % (ref 12.3–15.4)
WBC: 5.3 10*3/uL (ref 3.4–10.8)

## 2018-06-02 LAB — TSH: TSH: 1.42 u[IU]/mL (ref 0.450–4.500)

## 2018-06-02 LAB — T3, FREE: T3 FREE: 3.1 pg/mL (ref 2.0–4.4)

## 2018-06-02 LAB — T4, FREE: FREE T4: 1.14 ng/dL (ref 0.82–1.77)

## 2018-06-07 ENCOUNTER — Encounter: Payer: Self-pay | Admitting: *Deleted

## 2018-06-07 ENCOUNTER — Encounter: Payer: Self-pay | Admitting: General Practice

## 2018-06-21 DIAGNOSIS — H5203 Hypermetropia, bilateral: Secondary | ICD-10-CM | POA: Diagnosis not present

## 2018-06-21 DIAGNOSIS — H52223 Regular astigmatism, bilateral: Secondary | ICD-10-CM | POA: Diagnosis not present

## 2018-07-07 ENCOUNTER — Encounter: Payer: Self-pay | Admitting: Obstetrics and Gynecology

## 2018-07-07 ENCOUNTER — Other Ambulatory Visit (HOSPITAL_COMMUNITY)
Admission: RE | Admit: 2018-07-07 | Discharge: 2018-07-07 | Disposition: A | Discharge status: Home / Self Care | Payer: Medicaid Other | Source: Ambulatory Visit | Attending: Obstetrics and Gynecology | Admitting: Obstetrics and Gynecology

## 2018-07-07 ENCOUNTER — Ambulatory Visit (INDEPENDENT_AMBULATORY_CARE_PROVIDER_SITE_OTHER): Payer: Medicaid Other | Admitting: Obstetrics and Gynecology

## 2018-07-07 VITALS — BP 116/77 | HR 70 | Resp 16 | Ht 60.0 in | Wt 265.8 lb

## 2018-07-07 DIAGNOSIS — Z124 Encounter for screening for malignant neoplasm of cervix: Secondary | ICD-10-CM

## 2018-07-07 DIAGNOSIS — L819 Disorder of pigmentation, unspecified: Secondary | ICD-10-CM | POA: Insufficient documentation

## 2018-07-07 DIAGNOSIS — Z0000 Encounter for general adult medical examination without abnormal findings: Secondary | ICD-10-CM | POA: Diagnosis not present

## 2018-07-07 DIAGNOSIS — Z01419 Encounter for gynecological examination (general) (routine) without abnormal findings: Secondary | ICD-10-CM | POA: Insufficient documentation

## 2018-07-07 DIAGNOSIS — Z23 Encounter for immunization: Secondary | ICD-10-CM

## 2018-07-07 NOTE — Patient Instructions (Addendum)

## 2018-07-07 NOTE — Progress Notes (Signed)
History:  Ms. DEMITRIA HAY is a 57 y.o. No obstetric history on file who was referred to [facility] office today by [facility] for pap smear. She was seen at [facility] for her annual with pap on 03/02/2018. The provider and preceptor were unable to locate the patient's cervix. She returned for follow-up on 06/01/2018. After review of her operative report from her supracervical hysterectomy (done by Dr. Silas [place] on 06/10/2010), the provider and preceptor were unable to locate the patient's cervix. She was then referred here for a pap smear.   Patient Active Problem List   Diagnosis Date Noted  . Vitiligo 03/03/2018  . Encounter for Papanicolaou smear of cervix 03/03/2018  . Insomnia 11/05/2017  . Wrist pain, left 05/19/2016  . Fatigue 03/02/2016  . 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 Menopause 12/11/2014  . 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 Healthcare maintenance 12/01/2012  . Low back pain 01/27/2012  . GERD 03/22/2009  . Anxiety state 11/14/2008  . Eczema 05/18/2008  . Depression 03/10/2007  . HYPERCHOLESTEROLEMIA 12/09/2006  . Morbid obesity ([place]) 12/09/2006  . Anemia 12/09/2006    Allergies  Allergen Reactions  . Percocet [Oxycodone-Acetaminophen] Swelling  . Naproxen Palpitations    Current Outpatient Medications on File Prior to Visit  Medication Sig Dispense Refill  . acetaminophen (TYLENOL 8 HOUR) 650 MG CR tablet Take 1 tablet (650 mg total) by mouth every 8 (eight) hours as needed for pain. 90 tablet 1  . diphenhydrAMINE (BENADRYL) 25 mg capsule Take 1 capsule (25 mg total) by mouth every 4 (four) hours as needed for itching. x4 days 30 capsule 0  . triamcinolone (KENALOG) 0.025 % ointment Apply 1 application topically 2 (two) times daily. 30 g 3   No current facility-administered medications on file prior to visit.      The following portions of the patient's history were reviewed and updated as  appropriate: allergies, current medications, family history, past medical history, social history, past surgical history and problem list.  Review of Systems:  Other than those mentioned in HPI all ROS negative   Objective:  Physical Exam BP 116/77 (BP Location: Right Arm, Patient Position: Sitting, Cuff Size: Large)   Pulse 70   Resp 16   Ht 5' (1.524 m)   Wt 265 lb 12.8 oz (120.6 kg)   BMI 51.91 kg/m  CONSTITUTIONAL: Well-developed, well-nourished female in no acute distress.  GENITOURINARY: Normal appearing external genitalia; normal appearing vaginal mucosa, partial cervix visualized at 10 o'clock position.  Normal appearing discharge.  Pap smear obtained.  Uterus surgically removed, no other palpable masses, no adnexal tenderness. MUSCULOSKELETAL: Normal range of motion. No tenderness. SKIN: Skin is warm and dry. No rash noted. Not diaphoretic. No erythema. Discoloration over vulva, clitoral, anal areas, RT groin, and in between panniculus and mons pubis. [place]: Alert and oriented to person, place, and time. Normal reflexes, muscle tone, coordination. No cranial nerve deficit noted. PSYCHIATRIC: Normal mood and affect. Normal behavior. Normal judgment and thought content. HEM/LYMPH/IMMUNOLOGIC: Neck supple, no masses.  Normal thyroid.    Assessment & Plan:  Assessment: Discoloration of skin - vulva, anal, gluteal fold, and panniculus involved - Possible vitiligo   Need for immunization against influenza  - Flu Vaccine QUAD 36+ mos IM  Encounter for Papanicolaou smear of cervix  - Cytology - PAP - Samples of Uberlube lubrication given   Plans: Referral to [facility] for dermatology services Patient to return to [facility] in 1  year for annual exam or sooner if symptoms were to change or worsen  Laury Deep, [place] 07/07/2018 3:29 PM

## 2018-07-11 ENCOUNTER — Encounter: Payer: Self-pay | Admitting: General Practice

## 2018-07-11 LAB — CYTOLOGY - PAP
Adequacy: ABSENT
Chlamydia: NEGATIVE
Diagnosis: NEGATIVE
HPV: NOT DETECTED
Neisseria Gonorrhea: NEGATIVE

## 2018-07-12 ENCOUNTER — Telehealth: Payer: Self-pay | Admitting: *Deleted

## 2018-07-12 NOTE — Telephone Encounter (Signed)
-----   Message from [place], [place] sent at 07/11/2018  6:09 PM EDT ----- Her results were normal. She will only need to come next year for well woman exam without pap. She will need a pap repeated in 3-5 years.

## 2018-07-15 ENCOUNTER — Telehealth: Payer: Self-pay | Admitting: General Practice

## 2018-07-15 NOTE — Telephone Encounter (Signed)
Patient called wanting to know the update of her referral that was sent to [facility] for Dermatology consult.  Called the referring office and they did not receive the fax from 07/07/18.  Referral  faxed today and they did receive it.  Once patient is scheduled she will be contacted.  Patient is aware and verbalized understanding.

## 2018-07-18 ENCOUNTER — Encounter: Payer: Self-pay | Admitting: General Practice

## 2018-08-29 DIAGNOSIS — M25561 Pain in right knee: Secondary | ICD-10-CM | POA: Diagnosis not present

## 2018-08-29 DIAGNOSIS — M25562 Pain in left knee: Secondary | ICD-10-CM | POA: Diagnosis not present

## 2018-09-02 DIAGNOSIS — L80 Vitiligo: Secondary | ICD-10-CM | POA: Diagnosis not present

## 2018-09-13 DIAGNOSIS — L80 Vitiligo: Secondary | ICD-10-CM | POA: Diagnosis not present

## 2018-09-27 ENCOUNTER — Ambulatory Visit: Payer: Medicaid Other | Admitting: Family Medicine

## 2018-09-27 VITALS — BP 130/70 | HR 68 | Temp 98.2°F | Wt 264.2 lb

## 2018-09-27 DIAGNOSIS — G8929 Other chronic pain: Secondary | ICD-10-CM

## 2018-09-27 DIAGNOSIS — M25562 Pain in left knee: Secondary | ICD-10-CM | POA: Diagnosis not present

## 2018-09-27 DIAGNOSIS — M170 Bilateral primary osteoarthritis of knee: Secondary | ICD-10-CM

## 2018-09-27 NOTE — Patient Instructions (Signed)
It was a pleasure seeing you today.   Today we discussed your knee pain  For your knee pain: please use tylenol or ibuprofen as needed. You can also use heat. Follow up in 1 month if no improvement  Please follow up in 1 month if no improvement or sooner if symptoms persist or worsen. Please call the clinic immediately if you have any concerns.   Our clinic's number is [phone]. Please call with questions or concerns.   Thank you,  Caroline More, DO

## 2018-09-27 NOTE — Assessment & Plan Note (Addendum)
Patient was continued left knee pain.  Currently follows with orthopedics, [facility].  Have advised use of Tylenol ibuprofen as needed as well as heat and ice. X-ray imaging from February 2014 showing degenerative change involving the medial compartment which is consistent with patient's symptoms.  No red flag symptoms at this time.  Have encouraged daily exercise for provement and weight loss.  Ambulatory referral to physical therapy placed.  Patient is willing to participate in physical therapy.  Instructed patient that if she desires disability that she will need to contact disability office to have paperwork completed.  Patient will likely need physical exam by outside third-party.  Can continue to follow-up with orthopedics.  Follow-up with [facility] as needed.

## 2018-09-27 NOTE — Progress Notes (Signed)
   Subjective:    Patient ID: April [name], female    DOB: [birth year], 57 y.o.   MRN: [id number]   CC: Knee pain   HPI: Left Knee pain Patient presenting today with left knee pain.  States that she has being followed by [facility] for this pain.  Has had history of right knee replacement and was told by orthopedist back in 2014 that she would likely need replacement of her left knee.  Now orthopedist is telling her she will need her left knee replaced.  They have also told her to lose 60 pounds prior to operation.  Patient has been seen [facility] weight loss program but this cost her $100 per visit which is too expensive for her.  Is currently working on trying to get the [employer] program with insurance to cover the cost of gym membership.  Patient is also working to gain more disability to help cover cost.  Pain is worse when sitting and getting up.  Does notice popping, locking, and clicking.  Can walk about 1-1/2 miles or more a day.  Has not tried any medications.  Has not tried ice or heat.  Has been afebrile.  Objective:  BP 130/70   Pulse 68   Temp 98.2 F (36.8 C)   Wt 264 lb 3.2 oz (119.8 kg)   SpO2 97%   BMI 51.60 kg/m  Vitals and nursing note reviewed  General: well nourished, in no acute distress HEENT: normocephalic, no nasal discharge, moist mucous membranes  Cardiac: Regular rhythm  Respiratory: no increased work of breathing Extremities: no edema or cyanosis. Warm, well perfused. Knee, left: TTP noted at the upper medial portion. Inspection was negative for erythema, ecchymosis, and effusion. No obvious bony abnormalities or signs of osteophyte development. Palpation yielded no asymmetric warmth; No joint line tenderness; No condyle tenderness; No patellar tenderness; No patellar crepitus. Patellar and quadriceps tendons unremarkable. No obvious Baker's cyst development. ROM normal in flexion (135 degrees) but with some tenderness and extension (0  degrees). Normal hamstring and quadriceps strength. Neurovascularly intact bilaterally.  - Additional tests performed:    - Anterior Drawer >> NEG   - Lachman >> NEG  - Meniscus:   - McMurray's: NEG  - Patella:   - Patellar grind/compression: NEG   - Patellar glide: Without apprehension Skin: warm and dry, no rashes noted Neuro: alert and oriented, no focal deficits   Assessment & Plan:    Osteoarthritis of knees, bilateral Patient was continued left knee pain.  Currently follows with orthopedics, [facility].  Have advised use of Tylenol ibuprofen as needed as well as heat and ice. X-ray imaging from February 2014 showing degenerative change involving the medial compartment which is consistent with patient's symptoms.  No red flag symptoms at this time.  Have encouraged daily exercise for provement and weight loss.  Ambulatory referral to physical therapy placed.  Patient is willing to participate in physical therapy.  Instructed patient that if she desires disability that she will need to contact disability office to have paperwork completed.  Patient will likely need physical exam by outside third-party.  Can continue to follow-up with orthopedics.  Follow-up with [facility] as needed.    Return if symptoms worsen or fail to improve.   Caroline More, DO, PGY-2

## 2018-10-18 ENCOUNTER — Ambulatory Visit: Payer: Medicaid Other | Attending: Family Medicine | Admitting: Physical Therapy

## 2018-10-18 ENCOUNTER — Other Ambulatory Visit: Payer: Self-pay

## 2018-10-18 DIAGNOSIS — R262 Difficulty in walking, not elsewhere classified: Secondary | ICD-10-CM | POA: Insufficient documentation

## 2018-10-18 DIAGNOSIS — G8929 Other chronic pain: Secondary | ICD-10-CM | POA: Diagnosis present

## 2018-10-18 DIAGNOSIS — M6281 Muscle weakness (generalized): Secondary | ICD-10-CM | POA: Diagnosis present

## 2018-10-18 DIAGNOSIS — M25662 Stiffness of left knee, not elsewhere classified: Secondary | ICD-10-CM | POA: Diagnosis present

## 2018-10-18 DIAGNOSIS — M25562 Pain in left knee: Secondary | ICD-10-CM | POA: Diagnosis present

## 2018-10-18 NOTE — Patient Instructions (Signed)
      Voncille Lo, PT Certified Exercise Expert for the Aging Adult  10/18/18 11:42 AM Phone: [phone] Fax: [phone]
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
## 2018-11-01 ENCOUNTER — Encounter: Payer: Self-pay | Admitting: Physical Therapy

## 2018-11-01 ENCOUNTER — Ambulatory Visit: Payer: Medicaid Other | Admitting: Physical Therapy

## 2018-11-01 DIAGNOSIS — M6281 Muscle weakness (generalized): Secondary | ICD-10-CM

## 2018-11-01 DIAGNOSIS — M25662 Stiffness of left knee, not elsewhere classified: Secondary | ICD-10-CM

## 2018-11-01 DIAGNOSIS — M25562 Pain in left knee: Principal | ICD-10-CM

## 2018-11-01 DIAGNOSIS — R262 Difficulty in walking, not elsewhere classified: Secondary | ICD-10-CM

## 2018-11-01 DIAGNOSIS — G8929 Other chronic pain: Secondary | ICD-10-CM
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
## 2018-11-01 NOTE — Patient Instructions (Signed)
        Voncille Lo, PT Certified Exercise Expert for the Aging Adult  11/01/18 10:50 AM Phone: [phone] Fax: [phone]

## 2018-11-08 ENCOUNTER — Encounter: Payer: Self-pay | Admitting: Physical Therapy

## 2018-11-08 ENCOUNTER — Ambulatory Visit: Payer: Medicaid Other | Admitting: Physical Therapy

## 2018-11-08 DIAGNOSIS — G8929 Other chronic pain: Secondary | ICD-10-CM

## 2018-11-08 DIAGNOSIS — R262 Difficulty in walking, not elsewhere classified: Secondary | ICD-10-CM

## 2018-11-08 DIAGNOSIS — M6281 Muscle weakness (generalized): Secondary | ICD-10-CM

## 2018-11-08 DIAGNOSIS — M25662 Stiffness of left knee, not elsewhere classified: Secondary | ICD-10-CM

## 2018-11-08 DIAGNOSIS — M25562 Pain in left knee: Secondary | ICD-10-CM | POA: Diagnosis not present

## 2018-11-08 NOTE — Patient Instructions (Signed)
     Voncille Lo, PT Certified Exercise Expert for the Aging Adult  11/08/18 10:36 AM Phone: [phone] Fax: [phone]
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
## 2018-11-15 ENCOUNTER — Encounter: Payer: Self-pay | Admitting: Physical Therapy

## 2018-11-15 ENCOUNTER — Ambulatory Visit: Payer: Medicaid Other | Attending: Family Medicine | Admitting: Physical Therapy

## 2018-11-15 DIAGNOSIS — M25562 Pain in left knee: Secondary | ICD-10-CM | POA: Insufficient documentation

## 2018-11-15 DIAGNOSIS — R262 Difficulty in walking, not elsewhere classified: Secondary | ICD-10-CM | POA: Insufficient documentation

## 2018-11-15 DIAGNOSIS — M25662 Stiffness of left knee, not elsewhere classified: Secondary | ICD-10-CM | POA: Insufficient documentation

## 2018-11-15 DIAGNOSIS — G8929 Other chronic pain: Secondary | ICD-10-CM | POA: Insufficient documentation

## 2018-11-15 DIAGNOSIS — M6281 Muscle weakness (generalized): Secondary | ICD-10-CM | POA: Diagnosis present

## 2018-11-15 NOTE — Patient Instructions (Signed)
PRE hab exercises for Annalicia should she need to have TKR in the future.   Copyright  VHI. All rights reserved.  HIP: Flexion / KNEE: Extension, Straight Leg Raise   Raise leg, keeping knee straight. Perform slowly. _20__ reps per set, __3_ sets per day, _3 x a day 7__ days per week   Copyright  VHI. All rights reserved.  Heel Slide   Bend knee and pull heel toward buttocks. Hold _3___ seconds. Return. Repeat with other knee. Repeat _20___ times. Do __3-5__ sessions per day. Use a belt to assist bending  http://gt2.exer.us/372   Copyright  VHI. All rights reserved.     Raise leg until knee is straight. _20__ reps per set, _1__ sets per day, _3x a day 7 days per week  Copyright  VHI. All rights reserved.  Short [employer] a large can or rolled towel under leg. Straighten knee and leg. Hold __3__ seconds. Repeat with other leg. Repeat __20__ times. Do __320__ sessions per day.  http://gt2.exer.us/365   Copyright  VHI. All rights reserved.  Quad Set   Slowly tighten muscles on thigh of straight leg while counting out loud to _5___. Repeat with other leg. Repeat _30__ times. Do __3-5__ sessions per day. Put a towel roll at ankle     Leg Extension (Hamstring)   Sit toward front edge of chair, with leg out straight, heel on floor, toes pointing toward body. Keeping back straight, bend forward at hip, breathing out through pursed lips. Return, breathing in. Repeat _3__ times. Repeat with other leg. Do _3__ sessions per day. Hold for 30 sec    Hamstring Step 3   Left leg in maximal straight leg raise, heel at maximal stretch, straighten knee further by tightening knee cap. Warning: Intense stretch. Stay within tolerance. Hold 30___ seconds. Relax knee cap only. Repeat __3_ times. You may move your ankle up and down for comfort  Voncille Lo, PT Certified Exercise Expert for the Aging Adult  11/15/18 10:32 AM Phone: [phone] Fax:  [phone]
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
## 2018-11-19 ENCOUNTER — Other Ambulatory Visit: Payer: Self-pay

## 2018-11-19 ENCOUNTER — Emergency Department (HOSPITAL_COMMUNITY): Payer: Medicaid Other

## 2018-11-19 ENCOUNTER — Emergency Department (HOSPITAL_COMMUNITY)
Admission: EM | Admit: 2018-11-19 | Discharge: 2018-11-19 | Disposition: A | Discharge status: Home / Self Care | Payer: Medicaid Other | Attending: Emergency Medicine | Admitting: Emergency Medicine

## 2018-11-19 ENCOUNTER — Encounter (HOSPITAL_COMMUNITY): Payer: Self-pay

## 2018-11-19 DIAGNOSIS — M25562 Pain in left knee: Secondary | ICD-10-CM | POA: Diagnosis not present

## 2018-11-19 DIAGNOSIS — M79605 Pain in left leg: Secondary | ICD-10-CM

## 2018-11-19 DIAGNOSIS — Z96651 Presence of right artificial knee joint: Secondary | ICD-10-CM | POA: Insufficient documentation

## 2018-11-19 DIAGNOSIS — G8929 Other chronic pain: Secondary | ICD-10-CM | POA: Insufficient documentation

## 2018-11-19 DIAGNOSIS — Z87891 Personal history of nicotine dependence: Secondary | ICD-10-CM | POA: Diagnosis not present

## 2018-11-19 DIAGNOSIS — M1712 Unilateral primary osteoarthritis, left knee: Secondary | ICD-10-CM | POA: Diagnosis not present

## 2018-11-19 MED ORDER — TRAMADOL HCL 50 MG PO TABS: 50.0000 mg | ORAL_TABLET | Freq: Four times a day (QID) | ORAL | 0 refills | Status: DC | PRN

## 2018-11-19 MED ORDER — PREDNISONE 50 MG PO TABS: 50.0000 mg | ORAL_TABLET | Freq: Every day | ORAL | 0 refills | Status: DC

## 2018-11-19 MED ORDER — IBUPROFEN 200 MG PO TABS
400.0000 mg | ORAL_TABLET | Freq: Once | ORAL | Status: AC | PRN
  Administered 2018-11-19: 400 mg via ORAL
  Filled 2018-11-19: qty 2

## 2018-11-19 NOTE — Discharge Instructions (Signed)
Follow up with your orthopedist. Ice and elevate the leg. Return here as needed.

## 2018-11-19 NOTE — ED Triage Notes (Signed)
Patient states she has been having left knee pain since yesterday evening-states that the pain is now in her LLE-states she has arthritis-patient has had a knee replacement on the right-states she started doing some exercises because she has to lose 65 pounds to do the left knee. Patient states taking tylenol 650 at home with no relief.
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
## 2018-11-19 NOTE — ED Notes (Signed)
Bed: [facility] Expected date:  Expected time:  Means of arrival:  Comments: 

## 2018-11-21 ENCOUNTER — Other Ambulatory Visit: Payer: Self-pay | Admitting: Family Medicine

## 2018-11-21 ENCOUNTER — Telehealth: Payer: Self-pay | Admitting: Family Medicine

## 2018-11-21 DIAGNOSIS — M25562 Pain in left knee: Secondary | ICD-10-CM

## 2018-11-21 NOTE — Telephone Encounter (Signed)
Referral placed for orthopedics. Comments to send to Leta Jungling, DO, PGY-2 [facility] 11/21/2018 2:09 PM

## 2018-11-21 NOTE — Telephone Encounter (Signed)
Pt would like to know have if she can have a referral placed to [facility] for her knee. Pt said she has already had one knee replacement and is now having issues with the other knee.

## 2018-11-22 DIAGNOSIS — M25561 Pain in right knee: Secondary | ICD-10-CM | POA: Diagnosis not present

## 2018-12-09 DIAGNOSIS — L80 Vitiligo: Secondary | ICD-10-CM | POA: Diagnosis not present

## 2018-12-21 ENCOUNTER — Encounter (HOSPITAL_COMMUNITY): Payer: Self-pay | Admitting: Emergency Medicine

## 2018-12-21 ENCOUNTER — Other Ambulatory Visit: Payer: Self-pay

## 2018-12-21 ENCOUNTER — Emergency Department (HOSPITAL_COMMUNITY)
Admission: EM | Admit: 2018-12-21 | Discharge: 2018-12-21 | Disposition: A | Discharge status: Home / Self Care | Payer: Medicaid Other | Attending: Emergency Medicine | Admitting: Emergency Medicine

## 2018-12-21 DIAGNOSIS — M25562 Pain in left knee: Secondary | ICD-10-CM | POA: Insufficient documentation

## 2018-12-21 DIAGNOSIS — Z79899 Other long term (current) drug therapy: Secondary | ICD-10-CM | POA: Diagnosis not present

## 2018-12-21 DIAGNOSIS — F1410 Cocaine abuse, uncomplicated: Secondary | ICD-10-CM | POA: Insufficient documentation

## 2018-12-21 DIAGNOSIS — Z96651 Presence of right artificial knee joint: Secondary | ICD-10-CM | POA: Diagnosis not present

## 2018-12-21 DIAGNOSIS — G8929 Other chronic pain: Secondary | ICD-10-CM

## 2018-12-21 MED ORDER — TRAMADOL HCL 50 MG PO TABS: 50.0000 mg | ORAL_TABLET | Freq: Four times a day (QID) | ORAL | 0 refills | Status: DC | PRN

## 2018-12-21 MED ORDER — TRAMADOL HCL 50 MG PO TABS
50.0000 mg | ORAL_TABLET | Freq: Once | ORAL | Status: AC
  Administered 2018-12-21: 50 mg via ORAL
  Filled 2018-12-21: qty 1

## 2018-12-21 NOTE — ED Triage Notes (Signed)
Pt reports that was told will need left knee replacement after having her right on done. Pt reports that having left knee pain and her pain medication is having trouble being filled at pharmacy due to technical issues. Pt reports that Tramadol works well for her knee pain when she was prescribed that back in Feb when was seen here.

## 2018-12-21 NOTE — Discharge Instructions (Signed)
Take motrin, tylenol for pain   Take tramadol for severe pain   See Dr. Noemi Chapel for further prescriptions for pain   Return to ER if you have worse knee pain, unable to walk, knee swelling.
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
## 2018-12-29 ENCOUNTER — Telehealth: Payer: Self-pay | Admitting: Family Medicine

## 2018-12-29 DIAGNOSIS — L209 Atopic dermatitis, unspecified: Secondary | ICD-10-CM

## 2018-12-29 MED ORDER — TRIAMCINOLONE ACETONIDE 0.025 % EX OINT: 1.0000 "application " | TOPICAL_OINTMENT | Freq: Two times a day (BID) | CUTANEOUS | 3 refills | Status: DC

## 2018-12-29 MED ORDER — TRAMADOL HCL 50 MG PO TABS: 50.0000 mg | ORAL_TABLET | Freq: Four times a day (QID) | ORAL | 0 refills | Status: DC | PRN

## 2018-12-29 NOTE — Telephone Encounter (Signed)
Due to ongoing concerns with social distancing in light of COVID-19 cases, I called this patient to offer options for their visit today. Their visit was listed for f/u labwork.   I offered to discuss their care via phone and prescribe medications and order labs as needed, in order to spare them potential exposure to the community by coming to our office. They elected to schedule appointment in 1 month and have medications refilled over the phone.   HPI  Had lab work done and was told that Vit D was low, made appointment to get Vit D supplements and repeat labs. Also needed refill on cream for eczema. Patient also with left knee. Needed refill on tramadol as she was told by ED to f/u with PCP for further management.   Given concerns of COVID-19 will plan to send patient refills of tramadol 15 tabs as well as triamcinolone cream. Patient with appointment scheduled for 4/17 @2 :10PM (per patient preference) to follow up lab work and knee pain.   Denies current symptoms of cough or fever.   No problem-specific Assessment & Plan notes found for this encounter.   No orders of the defined types were placed in this encounter.   I counseled the patient that we are here for them during this trying time. I asked that they reach out via phone with any concerns regarding these problems or others that may come up. They were particularly counseled to call with concerns for cough, SOB, or fever.   Routing to Preceptor in accordance with our procedure. This is a no charge visit.   Caroline More, DO PGY-2

## 2018-12-30 ENCOUNTER — Ambulatory Visit: Payer: Medicaid Other | Admitting: Family Medicine

## 2019-01-23 ENCOUNTER — Other Ambulatory Visit: Payer: Self-pay | Admitting: Family Medicine

## 2019-01-24 ENCOUNTER — Other Ambulatory Visit: Payer: Self-pay

## 2019-01-24 ENCOUNTER — Telehealth (INDEPENDENT_AMBULATORY_CARE_PROVIDER_SITE_OTHER): Payer: Medicaid Other | Admitting: Family Medicine

## 2019-01-24 DIAGNOSIS — M170 Bilateral primary osteoarthritis of knee: Secondary | ICD-10-CM

## 2019-01-24 MED ORDER — TRAMADOL HCL 50 MG PO TABS: 50.0000 mg | ORAL_TABLET | Freq: Every day | ORAL | 0 refills | Status: DC | PRN

## 2019-01-24 NOTE — Telephone Encounter (Signed)
Patient should have telehealth visit if she continues to have pain. This was a short course medication  Caroline More, DO, PGY-2 [facility] 01/24/2019 2:02 PM

## 2019-01-24 NOTE — Telephone Encounter (Signed)
Patient scheduled for a telemedicine visit this afternoon.

## 2019-01-24 NOTE — Assessment & Plan Note (Addendum)
Established problem Uncontrolled Patient pain left knee inadequately controlled with APAP.  Intolerance to Naprosyn (palpitations).  Using small amount ot tramadol per day ( 1 tab/d) per report and consistent with [place] CSRS DB.  Pain interfering with iADLs and Left knee prehabilitation of weight loss required by [facility] before consideration of L TKR.   Trial of scheduled apap TID prn and tramadol daily #30 prn tablet (Zero RF), along with heat and ice,  Recommended that patient discuss treatment of chronic pain (scheduled APAP, duloxetine, knee compression sleeve, and assistance device (1-point cane) with instruction by PT in proper use, topical diclofenac, and topical capsaicin) with PCP or her orthopedist.

## 2019-01-24 NOTE — Telephone Encounter (Signed)
Saw that pt has a telemedicine appt on 01/27/2019 at 2:10p. Salvatore Marvel, CMA

## 2019-01-24 NOTE — Progress Notes (Signed)
[facility] Telemedicine Visit  Patient consented to have visit conducted via telephone.  Encounter participants: Patient: April [name]  Patient Location: Home Provider: Sherren Mocha Aldridge Krzyzanowski  Others (if applicable): none  Chief Complaint: kee pain  HPI:   Knee osteoarthritis  with knee pain involving the left knee. Onset was several years ago. Inciting event: none known. Current symptoms include: pain located anterior medial knee and stiffness. Pain is aggravated by any weight bearing, going up and down stairs, inactivity, standing and walking. Patient has had prior knee problems, she has had right TKR by thru [facility]. Evaluation to date: plain films: abnormal medial knee spurring and mild joint space narrowing and Ortho consult: [facility] who told her she needs to lose 50 pounds before having L TNR. Treatment to date: rest and APAP & tramadol. She uses tramadol about once a day to help complete her iADLs.   Patient has had 4 Rxs for tramadol #15 tablets at a time since February. Last tramadol Rx 12/29/18 by Dr Tammi Klippel.   ROS: No fever           No knee swelling, redness, incr warmth  Pertinent PMHx: RTKR  Exam:  Respiratory: speaking in full sentence, no audible wheeze, voice prosodic   Assessment/Plan:  No problem-specific Assessment & Plan notes found for this encounter.    Time spent on phone with patient: 11 minutes

## 2019-01-27 ENCOUNTER — Telehealth: Payer: Medicaid Other

## 2019-01-27 ENCOUNTER — Ambulatory Visit: Payer: Medicaid Other | Admitting: Family Medicine

## 2019-03-08 ENCOUNTER — Other Ambulatory Visit: Payer: Self-pay | Admitting: Family Medicine

## 2019-03-08 DIAGNOSIS — M170 Bilateral primary osteoarthritis of knee: Secondary | ICD-10-CM

## 2019-03-09 NOTE — Telephone Encounter (Signed)
Last refill 5/22 but only 2 pills. Will approve for 15 pills as patient is awaiting surgery by orthopedics. I am unable to e-prescribe at this time so will route message to Dr. Kris Mouton.   Dalphine Handing, PGY-2 [facility] 03/09/2019 2:48 PM

## 2019-03-14 ENCOUNTER — Encounter: Payer: Self-pay | Admitting: Family Medicine

## 2019-03-14 ENCOUNTER — Other Ambulatory Visit: Payer: Self-pay

## 2019-03-14 ENCOUNTER — Ambulatory Visit: Payer: Medicaid Other | Admitting: Family Medicine

## 2019-03-14 VITALS — BP 134/89 | HR 54

## 2019-03-14 DIAGNOSIS — E559 Vitamin D deficiency, unspecified: Secondary | ICD-10-CM

## 2019-03-14 NOTE — Progress Notes (Signed)
   Subjective:    Patient ID: April [name], female    DOB: 14-Oct-1960, 58 y.o.   MRN: [id number]   CC: vit D  HPI: Vitamin D Patient presenting for follow-up of low vitamin D levels.  Patient states that she obtain labs at [facility] and was told that she has a very low vitamin D and needed to seek care from her PCP.  Is unsure what the actual lab reading was did not have a copy of her labs available.  States that she obtained this lab for work-up of her vitiligo.  Does report some joint pain.  States that she is sometimes tired with all the time.  States that she cannot stand for long periods due to her bilateral knee pain which she follows orthopedics for.   Objective:  BP 134/89   Pulse (!) 54   SpO2 98%  Vitals and nursing note reviewed  General: well nourished, in no acute distress HEENT: normocephalic, TM's visualized bilaterally, PERRL, no scleral icterus or conjunctival pallor, no nasal discharge, moist mucous membranes, good dentition without erythema or discharge noted in posterior oropharynx Neck: supple, non-tender, without lymphadenopathy Cardiac: RRR, clear S1 and S2, no murmurs, rubs, or gallops Respiratory: clear to auscultation bilaterally, no increased work of breathing Abdomen: soft, nontender, nondistended, no masses or organomegaly. Bowel sounds present Extremities: no edema or cyanosis. Warm, well perfused.  Skin: warm and dry, no rashes noted Neuro: alert and oriented, no focal deficits   Assessment & Plan:    Vitamin D insufficiency Patient presenting for lab work to ensure vitamin D levels are appropriate.  Will obtain vitamin D level.  If insufficiency patient to take over-the-counter vitamin D supplement but will not need medication.  If deficiency can prescribe vitamin D supplementation.  Follow-up as needed.    Return if symptoms worsen or fail to improve.   Caroline More, DO, PGY-2

## 2019-03-14 NOTE — Patient Instructions (Signed)
It was a pleasure seeing you today.   Today we discussed your low vit D  For your vit d: I will get labs. I will call with the results.   Please follow up in 1 month if no improvement or sooner if symptoms persist or worsen. Please call the clinic immediately if you have any concerns.   Our clinic's number is [phone]. Please call with questions or concerns.   Please go to the emergency room if you have shortness of breath or chest pain  Thank you,  Caroline More, DO

## 2019-03-15 ENCOUNTER — Encounter: Payer: Self-pay | Admitting: Family Medicine

## 2019-03-15 DIAGNOSIS — E559 Vitamin D deficiency, unspecified: Secondary | ICD-10-CM | POA: Insufficient documentation

## 2019-03-15 LAB — VITAMIN D 25 HYDROXY (VIT D DEFICIENCY, FRACTURES): Vit D, 25-Hydroxy: 23.7 ng/mL — ABNORMAL LOW (ref 30.0–100.0)

## 2019-03-15 NOTE — Assessment & Plan Note (Signed)
Patient presenting for lab work to ensure vitamin D levels are appropriate.  Will obtain vitamin D level.  If insufficiency patient to take over-the-counter vitamin D supplement but will not need medication.  If deficiency can prescribe vitamin D supplementation.  Follow-up as needed.

## 2019-03-22 ENCOUNTER — Telehealth: Payer: Self-pay

## 2019-03-22 NOTE — Telephone Encounter (Signed)
Patient would a referral placed for a colonoscopy.

## 2019-03-23 ENCOUNTER — Other Ambulatory Visit: Payer: Self-pay

## 2019-03-23 ENCOUNTER — Other Ambulatory Visit: Payer: Self-pay | Admitting: Family Medicine

## 2019-03-23 DIAGNOSIS — Z1211 Encounter for screening for malignant neoplasm of colon: Secondary | ICD-10-CM

## 2019-03-23 DIAGNOSIS — M170 Bilateral primary osteoarthritis of knee: Secondary | ICD-10-CM

## 2019-03-23 MED ORDER — TRAMADOL HCL 50 MG PO TABS: ORAL_TABLET | ORAL | 0 refills | Status: DC

## 2019-03-23 NOTE — Telephone Encounter (Signed)
Attempted to call pt no VM set up. Lamond Glantz Kennon Holter, CMA

## 2019-03-23 NOTE — Progress Notes (Signed)
Referral for colonoscopy placed  April More, DO, PGY-2 [facility] 03/23/2019 7:30 AM

## 2019-03-23 NOTE — Telephone Encounter (Signed)
Please inform patient referral has been placed  April More, DO, PGY-2 [facility] 03/23/2019 7:31 AM

## 2019-04-12 ENCOUNTER — Other Ambulatory Visit: Payer: Self-pay

## 2019-04-12 DIAGNOSIS — M170 Bilateral primary osteoarthritis of knee: Secondary | ICD-10-CM

## 2019-04-12 MED ORDER — TRAMADOL HCL 50 MG PO TABS: ORAL_TABLET | ORAL | 0 refills | Status: DC

## 2019-04-12 NOTE — Telephone Encounter (Signed)
PMP aware reviewed.  Last refill of tramadol on 6/24 of a quantity of 7 pills.  Dalphine Handing, PGY-2 [facility] 04/12/2019 10:43 AM

## 2019-05-03 ENCOUNTER — Other Ambulatory Visit: Payer: Self-pay | Admitting: Family Medicine

## 2019-05-03 DIAGNOSIS — M170 Bilateral primary osteoarthritis of knee: Secondary | ICD-10-CM

## 2019-05-04 NOTE — Telephone Encounter (Signed)
Please inform patient that I will refill RX but for further refills she will need to schedule an appointment. This can be either virtual or in person  Caroline More, DO, PGY-3 [facility] 05/04/2019 10:09 AM

## 2019-05-05 ENCOUNTER — Telehealth: Payer: Self-pay | Admitting: Family Medicine

## 2019-05-05 NOTE — Telephone Encounter (Signed)
Patient is having trouble getting her Tramadol filled and it seems to be a problem with medicaid, maybe an authorization issue?  Please call patient and let her know how to proceed and when she can pick up medication from [facility] on [facility].

## 2019-05-08 NOTE — Telephone Encounter (Signed)
Discussed issue with patient. Per patient she clarified with pharmacy and they were able to confirm that my Rx was sent appropriately on July 23rd, 2020. States that they are waiting on medicaid approval. I informed patient that she should contact medicaid to see what is delaying approval. Per our records (and records at pharmacy) RX was sent on 05/04/19. If medicaid representative needs to speak to me directly I am happy to discuss this with them as well.   Dalphine Handing, PGY-3 [facility] 05/08/2019 11:20 AM

## 2019-05-09 NOTE — Telephone Encounter (Signed)
I think the issue is a PA needs to be done on tramadol.  Completed PA info in [place] for Tramadol.  Status pending.  Will recheck status in 24 hours. Christen Bame, CMA

## 2019-05-10 NOTE — Telephone Encounter (Signed)
Prior approval for Tramadol completed via [place] [employer].  Med approved for 05/09/2019 - 11/05/2019. Pharmacy informed.  Christen Bame, CMA

## 2019-05-25 ENCOUNTER — Other Ambulatory Visit: Payer: Self-pay | Admitting: Family Medicine

## 2019-05-25 DIAGNOSIS — M170 Bilateral primary osteoarthritis of knee: Secondary | ICD-10-CM

## 2019-06-07 ENCOUNTER — Ambulatory Visit (INDEPENDENT_AMBULATORY_CARE_PROVIDER_SITE_OTHER): Payer: Medicaid Other | Admitting: Family Medicine

## 2019-06-07 ENCOUNTER — Ambulatory Visit: Payer: Medicaid Other | Admitting: Family Medicine

## 2019-06-07 ENCOUNTER — Other Ambulatory Visit: Payer: Self-pay

## 2019-06-07 VITALS — BP 145/70 | HR 72

## 2019-06-07 DIAGNOSIS — G245 Blepharospasm: Secondary | ICD-10-CM

## 2019-06-07 DIAGNOSIS — M25562 Pain in left knee: Secondary | ICD-10-CM

## 2019-06-07 DIAGNOSIS — M170 Bilateral primary osteoarthritis of knee: Secondary | ICD-10-CM

## 2019-06-07 MED ORDER — METHYLPREDNISOLONE ACETATE 40 MG/ML IJ SUSP
40.0000 mg | Freq: Once | INTRAMUSCULAR | Status: AC
  Administered 2019-06-07: 16:00:00 30 mg via INTRAMUSCULAR

## 2019-06-07 NOTE — Patient Instructions (Signed)
It was great to meet you today! Thank you for letting me participate in your care!  Today, we discussed your elevated blood pressure. Since you have no history of high blood pressure we will monitor it at home for 2 weeks. Every morning while you are at rest with your feet flat on the floor and your arm resting at heart level please check your blood pressure and record it.   I also recommend you start a low salt diet and exercise for 30 minutes per day 5 times per week as these changes will lower your blood pressure. Please bring in the recorded readings to your PCP in one month. You can then make an educated shared decision about how to best proceed. If your top number (systolic blood pressure) is above 140 consistently and/or the bottom number (diastolic blood pressure) is above 90 you would benefit from starting a medication.  I injected your left knee today with a corticosteroid. Please do not get another before 3 months.  Be well, Harolyn Rutherford, DO PGY-3, [facility]

## 2019-06-07 NOTE — Progress Notes (Signed)
Subjective: Chief Complaint  Patient presents with   Knee Pain    HPI: April [name] is a 58 y.o. presenting to clinic today to discuss the following:  Left Knee Pain Patient with left knee pain and known osteoarthritis presents for injection. She typically gets them done at her Orthopedic office but has not had one in about 3 months. She would like one today as she has had them in the past and gets a good response for pain relief.   Eye Twitching Patient has not experienced this before and endorses a "couple of weeks" of eye twitching. No eye pain, change in vision, no discharge, discoloration of her eye. No recent allergic reactions or sickness. She does endorse mild increase in her stress level.   ROS noted in HPI.   Past Medical, Surgical, Social, and Family History Reviewed & Updated per EMR.   Pertinent Historical Findings include:   Social History   Tobacco Use  Smoking Status Former Smoker   Quit date: 11/13/2002   Years since quitting: 16.5  Smokeless Tobacco Never Used    Objective: BP (!) 145/70    Pulse 72    SpO2 99%  Vitals and nursing notes reviewed  Physical Exam Gen: Alert and Oriented x 3, NAD HEENT: Normocephalic, atraumatic, PERRLA, EOMI, normal white scelra CV: RRR, no murmurs, normal S1, S2 split Resp: CTAB, no wheezing, rales, or rhonchi, comfortable work of breathing Abd: non-distended, non-tender, soft, +bs in all four quadrants MSK: Knee, Left: TTP noted at the medial joint line. Inspection was negative for erythema, ecchymosis, and effusion. No obvious bony abnormalities or signs of osteophyte development. Palpation yielded no asymmetric warmth; No condyle tenderness; No patellar tenderness; Positive patellar crepitus. Patellar and quadriceps tendons unremarkable, and no tenderness of the pes anserine bursa. No obvious Baker's cyst development. ROM normal in flexion (135 degrees) and extension (0 degrees). Normal hamstring and quadriceps  strength. Neurovascularly intact bilaterally.  - Ligaments: (Solid and consistent endpoints)   - ACL (present bilaterally)   - PCL (present bilaterally)   - LCL (present bilaterally)   - MCL (present bilaterally).   - Additional tests performed:    - Anterior/Posterior Drawer >> NEG  - Patella:   - Patellar grind/compression: Positive   - Patellar glide: Without apprehension Ext: no clubbing, cyanosis, or edema Skin: warm, dry, intact, no rashes  Assessment/Plan:  Osteoarthritis of knees, bilateral Left knee injection with 3:1 lidocaine with steroid. - F/u as needed  Eye twitch Unclear etiology. Given no pain, normal exam, and no change in vision I am not concerned this is something potentially dangerous and most likely spasm of her eyelid muscles. Could be stress related - f/u as needed   PRE-OP DIAGNOSIS: Knee Osteoarthritis POST-OP DIAGNOSIS: Same  PROCEDURE: joint injection Performing Physician: Harolyn Rutherford, DO Supervising Physician (if applicable): none   Dose:  Lidocaine and Steroid 40mg /mL Triamcinolone acetonide in a 3:1 ratio.   Procedure: The area was prepped in the usual sterile manner. The needle was inserted into the affected area and the steroid was injected.  There were no complications during this procedure.   Followup: The patient tolerated the procedure well without complications.  Standard post-procedure care is explained and return precautions are given.   PATIENT EDUCATION PROVIDED: See AVS    Diagnosis and plan along with any newly prescribed medication(s) were discussed in detail with this patient today. The patient verbalized understanding and agreed with the plan. Patient advised if symptoms worsen  return to clinic or ER.    Meds ordered this encounter  Medications   methylPREDNISolone acetate (DEPO-MEDROL) injection 40 mg     Harolyn Rutherford, DO 06/07/2019, 3:13 PM PGY-3 [facility]

## 2019-06-12 DIAGNOSIS — G245 Blepharospasm: Secondary | ICD-10-CM | POA: Insufficient documentation

## 2019-06-12 NOTE — Assessment & Plan Note (Signed)
Left knee injection with 3:1 lidocaine with steroid. - F/u as needed

## 2019-06-12 NOTE — Assessment & Plan Note (Signed)
Unclear etiology. Given no pain, normal exam, and no change in vision I am not concerned this is something potentially dangerous and most likely spasm of her eyelid muscles. Could be stress related - f/u as needed

## 2019-06-13 ENCOUNTER — Other Ambulatory Visit: Payer: Self-pay

## 2019-06-13 ENCOUNTER — Encounter (INDEPENDENT_AMBULATORY_CARE_PROVIDER_SITE_OTHER): Payer: Self-pay | Admitting: Family Medicine

## 2019-06-13 ENCOUNTER — Ambulatory Visit (INDEPENDENT_AMBULATORY_CARE_PROVIDER_SITE_OTHER): Payer: Medicaid Other | Admitting: Family Medicine

## 2019-06-13 VITALS — BP 118/80 | HR 67 | Temp 98.0°F | Ht 60.0 in | Wt 256.0 lb

## 2019-06-13 DIAGNOSIS — R0602 Shortness of breath: Secondary | ICD-10-CM | POA: Diagnosis not present

## 2019-06-13 DIAGNOSIS — R739 Hyperglycemia, unspecified: Secondary | ICD-10-CM | POA: Diagnosis not present

## 2019-06-13 DIAGNOSIS — Z6841 Body Mass Index (BMI) 40.0 and over, adult: Secondary | ICD-10-CM | POA: Diagnosis not present

## 2019-06-13 DIAGNOSIS — R5383 Other fatigue: Secondary | ICD-10-CM | POA: Diagnosis not present

## 2019-06-13 DIAGNOSIS — Z9189 Other specified personal risk factors, not elsewhere classified: Secondary | ICD-10-CM | POA: Diagnosis not present

## 2019-06-13 DIAGNOSIS — Z0289 Encounter for other administrative examinations: Secondary | ICD-10-CM

## 2019-06-13 DIAGNOSIS — E559 Vitamin D deficiency, unspecified: Secondary | ICD-10-CM

## 2019-06-13 DIAGNOSIS — Z1331 Encounter for screening for depression: Secondary | ICD-10-CM | POA: Diagnosis not present

## 2019-06-13 DIAGNOSIS — E782 Mixed hyperlipidemia: Secondary | ICD-10-CM | POA: Diagnosis not present
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
## 2019-06-14 LAB — LIPID PANEL WITH LDL/HDL RATIO
Cholesterol, Total: 251 mg/dL — ABNORMAL HIGH (ref 100–199)
HDL: 50 mg/dL (ref >39)
LDL Chol Calc (NIH): 185 mg/dL — ABNORMAL HIGH (ref 0–99)
LDL/HDL Ratio: 3.7 ratio — ABNORMAL HIGH (ref 0.0–3.2)
Triglycerides: 94 mg/dL (ref 0–149)
VLDL Cholesterol Cal: 16 mg/dL (ref 5–40)

## 2019-06-15 DIAGNOSIS — L80 Vitiligo: Secondary | ICD-10-CM | POA: Diagnosis not present

## 2019-06-15 LAB — HEMOGLOBIN A1C
Est. average glucose Bld gHb Est-mCnc: 108 mg/dL
Hgb A1c MFr Bld: 5.4 % (ref 4.8–5.6)

## 2019-06-15 LAB — COMPREHENSIVE METABOLIC PANEL
ALT: 8 IU/L (ref 0–32)
AST: 16 IU/L (ref 0–40)
Albumin/Globulin Ratio: 1.2 (ref 1.2–2.2)
Albumin: 4.3 g/dL (ref 3.8–4.9)
Alkaline Phosphatase: 144 IU/L — ABNORMAL HIGH (ref 39–117)
BUN/Creatinine Ratio: 24 — ABNORMAL HIGH (ref 9–23)
BUN: 14 mg/dL (ref 6–24)
Bilirubin Total: 0.3 mg/dL (ref 0.0–1.2)
CO2: 22 mmol/L (ref 20–29)
Calcium: 9.4 mg/dL (ref 8.7–10.2)
Chloride: 102 mmol/L (ref 96–106)
Creatinine, Ser: 0.58 mg/dL (ref 0.57–1.00)
GFR calc Af Amer: 119 mL/min/{1.73_m2} (ref >59)
GFR calc non Af Amer: 103 mL/min/{1.73_m2} (ref >59)
Globulin, Total: 3.6 g/dL (ref 1.5–4.5)
Glucose: 71 mg/dL (ref 65–99)
Potassium: 4.3 mmol/L (ref 3.5–5.2)
Sodium: 141 mmol/L (ref 134–144)
Total Protein: 7.9 g/dL (ref 6.0–8.5)

## 2019-06-15 LAB — VITAMIN D 25 HYDROXY (VIT D DEFICIENCY, FRACTURES): Vit D, 25-Hydroxy: 34.4 ng/mL (ref 30.0–100.0)

## 2019-06-15 LAB — INSULIN, RANDOM: INSULIN: 9.5 u[IU]/mL (ref 2.6–24.9)

## 2019-06-21 ENCOUNTER — Other Ambulatory Visit: Payer: Self-pay | Admitting: Family Medicine

## 2019-06-21 DIAGNOSIS — M170 Bilateral primary osteoarthritis of knee: Secondary | ICD-10-CM

## 2019-06-27 ENCOUNTER — Encounter (INDEPENDENT_AMBULATORY_CARE_PROVIDER_SITE_OTHER): Payer: Self-pay | Admitting: Family Medicine

## 2019-06-27 ENCOUNTER — Ambulatory Visit (INDEPENDENT_AMBULATORY_CARE_PROVIDER_SITE_OTHER): Payer: Medicaid Other | Admitting: Family Medicine

## 2019-06-27 ENCOUNTER — Other Ambulatory Visit: Payer: Self-pay

## 2019-06-27 VITALS — BP 115/79 | HR 82 | Temp 97.5°F | Ht 60.0 in | Wt 255.0 lb

## 2019-06-27 DIAGNOSIS — Z6841 Body Mass Index (BMI) 40.0 and over, adult: Secondary | ICD-10-CM | POA: Diagnosis not present

## 2019-06-27 DIAGNOSIS — E7849 Other hyperlipidemia: Secondary | ICD-10-CM | POA: Diagnosis not present

## 2019-06-27 DIAGNOSIS — E559 Vitamin D deficiency, unspecified: Secondary | ICD-10-CM | POA: Diagnosis not present

## 2019-06-27 DIAGNOSIS — E8881 Metabolic syndrome: Secondary | ICD-10-CM | POA: Diagnosis not present

## 2019-06-27 MED ORDER — VITAMIN D (ERGOCALCIFEROL) 1.25 MG (50000 UNIT) PO CAPS: 50000.0000 [IU] | ORAL_CAPSULE | ORAL | 0 refills | Status: DC
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
## 2019-07-07 ENCOUNTER — Other Ambulatory Visit: Payer: Self-pay | Admitting: Family Medicine

## 2019-07-07 DIAGNOSIS — M170 Bilateral primary osteoarthritis of knee: Secondary | ICD-10-CM

## 2019-07-10 ENCOUNTER — Ambulatory Visit (INDEPENDENT_AMBULATORY_CARE_PROVIDER_SITE_OTHER): Payer: Medicaid Other | Admitting: Family Medicine

## 2019-07-10 ENCOUNTER — Other Ambulatory Visit: Payer: Self-pay

## 2019-07-10 VITALS — BP 119/84 | HR 71 | Temp 98.3°F | Ht 60.0 in | Wt 253.0 lb

## 2019-07-10 DIAGNOSIS — M170 Bilateral primary osteoarthritis of knee: Secondary | ICD-10-CM

## 2019-07-10 DIAGNOSIS — Z6841 Body Mass Index (BMI) 40.0 and over, adult: Secondary | ICD-10-CM

## 2019-07-10 DIAGNOSIS — E559 Vitamin D deficiency, unspecified: Secondary | ICD-10-CM

## 2019-07-10 DIAGNOSIS — E8881 Metabolic syndrome: Secondary | ICD-10-CM | POA: Diagnosis not present

## 2019-07-10 MED ORDER — VITAMIN D (ERGOCALCIFEROL) 1.25 MG (50000 UNIT) PO CAPS: 50000.0000 [IU] | ORAL_CAPSULE | ORAL | 0 refills | Status: DC

## 2019-07-11 ENCOUNTER — Encounter (INDEPENDENT_AMBULATORY_CARE_PROVIDER_SITE_OTHER): Payer: Self-pay | Admitting: Family Medicine

## 2019-07-11 DIAGNOSIS — Z6841 Body Mass Index (BMI) 40.0 and over, adult: Secondary | ICD-10-CM | POA: Insufficient documentation

## 2019-07-11 DIAGNOSIS — E66813 Obesity, class 3: Secondary | ICD-10-CM | POA: Insufficient documentation

## 2019-07-11 DIAGNOSIS — E88819 Insulin resistance, unspecified: Secondary | ICD-10-CM | POA: Insufficient documentation

## 2019-07-11 DIAGNOSIS — E8881 Metabolic syndrome: Secondary | ICD-10-CM | POA: Insufficient documentation
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
## 2019-07-24 ENCOUNTER — Ambulatory Visit (INDEPENDENT_AMBULATORY_CARE_PROVIDER_SITE_OTHER): Payer: Medicaid Other | Admitting: Family Medicine

## 2019-07-24 ENCOUNTER — Other Ambulatory Visit: Payer: Self-pay | Admitting: Family Medicine

## 2019-07-24 ENCOUNTER — Other Ambulatory Visit: Payer: Self-pay

## 2019-07-24 VITALS — BP 119/78 | HR 75 | Temp 98.6°F | Ht 60.0 in | Wt 252.0 lb

## 2019-07-24 DIAGNOSIS — M170 Bilateral primary osteoarthritis of knee: Secondary | ICD-10-CM

## 2019-07-24 DIAGNOSIS — Z6841 Body Mass Index (BMI) 40.0 and over, adult: Secondary | ICD-10-CM

## 2019-07-24 DIAGNOSIS — E8881 Metabolic syndrome: Secondary | ICD-10-CM | POA: Diagnosis not present
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
## 2019-07-26 ENCOUNTER — Encounter (INDEPENDENT_AMBULATORY_CARE_PROVIDER_SITE_OTHER): Payer: Self-pay | Admitting: Family Medicine

## 2019-08-08 ENCOUNTER — Encounter (INDEPENDENT_AMBULATORY_CARE_PROVIDER_SITE_OTHER): Payer: Self-pay | Admitting: Family Medicine

## 2019-08-08 ENCOUNTER — Ambulatory Visit (INDEPENDENT_AMBULATORY_CARE_PROVIDER_SITE_OTHER): Payer: Medicaid Other | Admitting: Family Medicine

## 2019-08-08 ENCOUNTER — Other Ambulatory Visit: Payer: Self-pay

## 2019-08-08 VITALS — BP 112/76 | HR 65 | Temp 98.0°F | Ht 60.0 in | Wt 249.0 lb

## 2019-08-08 DIAGNOSIS — Z6841 Body Mass Index (BMI) 40.0 and over, adult: Secondary | ICD-10-CM

## 2019-08-08 DIAGNOSIS — E8881 Metabolic syndrome: Secondary | ICD-10-CM | POA: Diagnosis not present

## 2019-08-08 DIAGNOSIS — E559 Vitamin D deficiency, unspecified: Secondary | ICD-10-CM | POA: Diagnosis not present

## 2019-08-08 MED ORDER — VITAMIN D (ERGOCALCIFEROL) 1.25 MG (50000 UNIT) PO CAPS: 50000.0000 [IU] | ORAL_CAPSULE | ORAL | 0 refills | Status: DC
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
## 2019-08-10 ENCOUNTER — Other Ambulatory Visit: Payer: Self-pay | Admitting: Family Medicine

## 2019-08-10 ENCOUNTER — Encounter (INDEPENDENT_AMBULATORY_CARE_PROVIDER_SITE_OTHER): Payer: Self-pay | Admitting: Family Medicine

## 2019-08-10 DIAGNOSIS — M170 Bilateral primary osteoarthritis of knee: Secondary | ICD-10-CM

## 2019-08-29 ENCOUNTER — Ambulatory Visit (INDEPENDENT_AMBULATORY_CARE_PROVIDER_SITE_OTHER): Payer: Medicaid Other | Admitting: Family Medicine

## 2019-08-29 ENCOUNTER — Encounter (INDEPENDENT_AMBULATORY_CARE_PROVIDER_SITE_OTHER): Payer: Self-pay | Admitting: Family Medicine

## 2019-08-29 ENCOUNTER — Other Ambulatory Visit: Payer: Self-pay

## 2019-08-29 VITALS — BP 116/74 | HR 69 | Temp 98.1°F | Ht 60.0 in | Wt 251.0 lb

## 2019-08-29 DIAGNOSIS — Z6841 Body Mass Index (BMI) 40.0 and over, adult: Secondary | ICD-10-CM

## 2019-08-29 DIAGNOSIS — E8881 Metabolic syndrome: Secondary | ICD-10-CM

## 2019-08-29 DIAGNOSIS — E559 Vitamin D deficiency, unspecified: Secondary | ICD-10-CM

## 2019-08-29 MED ORDER — VITAMIN D (ERGOCALCIFEROL) 1.25 MG (50000 UNIT) PO CAPS: 50000.0000 [IU] | ORAL_CAPSULE | ORAL | 0 refills | Status: DC

## 2019-08-30 ENCOUNTER — Other Ambulatory Visit: Payer: Self-pay

## 2019-08-30 ENCOUNTER — Ambulatory Visit (INDEPENDENT_AMBULATORY_CARE_PROVIDER_SITE_OTHER): Payer: Medicaid Other | Admitting: Family Medicine

## 2019-08-30 ENCOUNTER — Telehealth: Payer: Self-pay

## 2019-08-30 ENCOUNTER — Encounter: Payer: Self-pay | Admitting: Family Medicine

## 2019-08-30 DIAGNOSIS — M170 Bilateral primary osteoarthritis of knee: Secondary | ICD-10-CM | POA: Diagnosis not present

## 2019-08-30 DIAGNOSIS — G4700 Insomnia, unspecified: Secondary | ICD-10-CM

## 2019-08-30 DIAGNOSIS — L309 Dermatitis, unspecified: Secondary | ICD-10-CM | POA: Diagnosis not present

## 2019-08-30 DIAGNOSIS — Z23 Encounter for immunization: Secondary | ICD-10-CM

## 2019-08-30 MED ORDER — MELATONIN 3 MG PO TABS: 1.0000 | ORAL_TABLET | Freq: Every day | ORAL | 3 refills | Status: DC

## 2019-08-30 MED ORDER — TRIAMCINOLONE ACETONIDE 0.1 % EX OINT: 1.0000 "application " | TOPICAL_OINTMENT | Freq: Two times a day (BID) | CUTANEOUS | 1 refills | Status: DC
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
## 2019-08-30 NOTE — Patient Instructions (Signed)
I will have my nurse copy the form and put in Dr. Marilynne Drivers mailbox. I will put in my note your limitations on walking and standing to help her fill out the forms.    I refilled a big jar or your eczema ointment.  Remember, keep the soaps to a minimum. I sent in a prescription for Melatonin which is an over the counter sleep medicine.  I hope that Medicaid pays for it.   Remember to talk to Dr. Tammi Klippel next visit about the Shingles vaccine.

## 2019-08-30 NOTE — Telephone Encounter (Signed)
Clinical info completed on Discharge Application, form.  Place form in Dr. Dorathy Kinsman box for completion. Please call pt to pick up form when it is ready.  Ottis Stain, CMA

## 2019-08-31 ENCOUNTER — Encounter: Payer: Self-pay | Admitting: Family Medicine

## 2019-08-31 ENCOUNTER — Encounter (INDEPENDENT_AMBULATORY_CARE_PROVIDER_SITE_OTHER): Payer: Self-pay | Admitting: Family Medicine

## 2019-08-31 NOTE — Assessment & Plan Note (Signed)
The apparent cause of her disability.  I documented her limitations as requested.  We also copied her needed form and place in PCP's box to complete.

## 2019-08-31 NOTE — Progress Notes (Signed)
   Subjective:    Patient ID: April [name], female    DOB: 07/29/1961, 58 y.o.   MRN: [id number]  HPISeveral issues 1. Patient C/O stress causing poor sleep.  While she did not provide specific details, she led me to believe that she had significant events going on in her life.  Not on any previous or current psych meds. 2. She wants a refill on her eczema medication.  States it is worse on her neck and arms.  She acted unaware when I discussed avoiding harsh soaps. 3. Needs flu shot today.  We also discussed the shingles vaccine.  She did not want an Rx today.  Stated that she would discuss with Dr. Tammi Klippel next visit. 4. Has paperwork to fill out regarding her chronic disability.  States that she has been on SSI disability for years due to knee pain. She is S/P right knee placement.  Apparently that surgery did not go well and she will not consider further surgery.  She now has chronic bilateral kneed pain.  She asked that I document her limitations about prolonged standing and walking.  States she can stand only about 15 minutes due to pain.  She can walk slowly across a level football field.  She can also walk up two flights of stairs.    Review of Systems     Objective:   Physical Exam Skin mild eczema of neck and arms.  None on face. Psych.  Affect normal throughout.  No disorder thought.         Assessment & Plan:

## 2019-08-31 NOTE — Assessment & Plan Note (Signed)
Refilled triamcinolone

## 2019-08-31 NOTE — Assessment & Plan Note (Signed)
She was reluctant to try melatonin because it is OTC and Medicaid may not cover.  Stayed with melatonin due to safety profile.

## 2019-09-06 ENCOUNTER — Other Ambulatory Visit: Payer: Self-pay | Admitting: Family Medicine

## 2019-09-06 DIAGNOSIS — M170 Bilateral primary osteoarthritis of knee: Secondary | ICD-10-CM

## 2019-09-06 NOTE — Telephone Encounter (Signed)
LMOVM informing her that "the form you dropped off at your doctors office is ready to be picked up".  Copy placed in batch scanning. Christen Bame, CMA

## 2019-09-06 NOTE — Telephone Encounter (Signed)
Form completed and placed in RN box  Caroline More, DO, PGY-3 [facility] 09/06/2019 8:14 AM

## 2019-09-19 ENCOUNTER — Encounter (INDEPENDENT_AMBULATORY_CARE_PROVIDER_SITE_OTHER): Payer: Self-pay | Admitting: Family Medicine

## 2019-09-19 ENCOUNTER — Ambulatory Visit (INDEPENDENT_AMBULATORY_CARE_PROVIDER_SITE_OTHER): Payer: Medicaid Other | Admitting: Family Medicine

## 2019-09-19 ENCOUNTER — Other Ambulatory Visit: Payer: Self-pay

## 2019-09-19 VITALS — BP 116/70 | HR 64 | Temp 98.4°F | Ht 60.0 in | Wt 252.0 lb

## 2019-09-19 DIAGNOSIS — E559 Vitamin D deficiency, unspecified: Secondary | ICD-10-CM | POA: Diagnosis not present

## 2019-09-19 DIAGNOSIS — Z6841 Body Mass Index (BMI) 40.0 and over, adult: Secondary | ICD-10-CM | POA: Diagnosis not present

## 2019-09-19 DIAGNOSIS — Z9189 Other specified personal risk factors, not elsewhere classified: Secondary | ICD-10-CM

## 2019-09-19 DIAGNOSIS — G4700 Insomnia, unspecified: Secondary | ICD-10-CM | POA: Diagnosis not present

## 2019-09-19 MED ORDER — VITAMIN D (ERGOCALCIFEROL) 1.25 MG (50000 UNIT) PO CAPS: 50000.0000 [IU] | ORAL_CAPSULE | ORAL | 0 refills | Status: DC

## 2019-09-20 ENCOUNTER — Encounter (INDEPENDENT_AMBULATORY_CARE_PROVIDER_SITE_OTHER): Payer: Self-pay | Admitting: Family Medicine
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
## 2019-09-29 ENCOUNTER — Other Ambulatory Visit: Payer: Self-pay | Admitting: Family Medicine

## 2019-09-29 DIAGNOSIS — M170 Bilateral primary osteoarthritis of knee: Secondary | ICD-10-CM

## 2019-10-02 ENCOUNTER — Encounter (INDEPENDENT_AMBULATORY_CARE_PROVIDER_SITE_OTHER): Payer: Self-pay

## 2019-10-02 ENCOUNTER — Ambulatory Visit (INDEPENDENT_AMBULATORY_CARE_PROVIDER_SITE_OTHER): Payer: Medicaid Other | Admitting: Family Medicine

## 2019-10-03 ENCOUNTER — Ambulatory Visit (INDEPENDENT_AMBULATORY_CARE_PROVIDER_SITE_OTHER): Payer: Medicaid Other | Admitting: Bariatrics

## 2019-10-10 ENCOUNTER — Other Ambulatory Visit (INDEPENDENT_AMBULATORY_CARE_PROVIDER_SITE_OTHER): Payer: Self-pay | Admitting: Family Medicine

## 2019-10-10 DIAGNOSIS — E559 Vitamin D deficiency, unspecified: Secondary | ICD-10-CM

## 2019-10-20 ENCOUNTER — Other Ambulatory Visit: Payer: Self-pay | Admitting: Family Medicine

## 2019-10-20 DIAGNOSIS — M170 Bilateral primary osteoarthritis of knee: Secondary | ICD-10-CM

## 2019-10-24 ENCOUNTER — Ambulatory Visit (INDEPENDENT_AMBULATORY_CARE_PROVIDER_SITE_OTHER): Payer: Medicaid Other | Admitting: Family Medicine

## 2019-10-24 ENCOUNTER — Other Ambulatory Visit: Payer: Self-pay

## 2019-10-24 ENCOUNTER — Encounter (INDEPENDENT_AMBULATORY_CARE_PROVIDER_SITE_OTHER): Payer: Self-pay | Admitting: Family Medicine

## 2019-10-24 VITALS — BP 109/75 | HR 73 | Temp 98.1°F | Ht 60.0 in | Wt 251.0 lb

## 2019-10-24 DIAGNOSIS — E7849 Other hyperlipidemia: Secondary | ICD-10-CM | POA: Diagnosis not present

## 2019-10-24 DIAGNOSIS — Z6841 Body Mass Index (BMI) 40.0 and over, adult: Secondary | ICD-10-CM

## 2019-10-24 DIAGNOSIS — N951 Menopausal and female climacteric states: Secondary | ICD-10-CM | POA: Diagnosis not present

## 2019-10-24 DIAGNOSIS — E559 Vitamin D deficiency, unspecified: Secondary | ICD-10-CM

## 2019-10-24 DIAGNOSIS — G4700 Insomnia, unspecified: Secondary | ICD-10-CM

## 2019-10-24 MED ORDER — VITAMIN D (ERGOCALCIFEROL) 1.25 MG (50000 UNIT) PO CAPS: 50000.0000 [IU] | ORAL_CAPSULE | ORAL | 0 refills | Status: DC

## 2019-10-24 MED ORDER — GABAPENTIN 100 MG PO CAPS: 100.0000 mg | ORAL_CAPSULE | Freq: Every day | ORAL | 0 refills | Status: DC

## 2019-10-24 NOTE — Progress Notes (Signed)
Chief Complaint:   [name] is here to discuss her progress with her obesity treatment plan along with follow-up of her obesity related diagnoses. April [name] is on the Category 2 Plan and states she is following her eating plan approximately 60% of the time. [name] states she is exercising in the house for 15 minutes 2 times per week.  Today's visit was #: 8 Starting weight: 256 lbs Starting date: 06/13/2019 Today's weight: 251 lbs Today's date: 10/24/2019 Total lbs lost to date: 5 lbs Total lbs lost since last in-office visit: 1 lb  Interim History: April [name] says she enjoyed the holidays. She is happily surprised that she is down 1 pound.  She brings in Atkins bars and [employer] sausage patty to review together.  Subjective:   1. Vitamin D deficiency Vitamin D level was 34.4 on 06/13/2019.  This is up from 23.7.  She is currently taking vit D. She denies nausea, vomiting or muscle weakness.  2. Insomnia, unspecified type Amadi struggles with insomnia. Symptoms include: has frequent nighttime awakenings and is not rested upon awakening.   3. Vasomotor symptoms due to menopause The patient is currently having some difficulty with menopause symptoms.  4. Other hyperlipidemia April [name] has hyperlipidemia and has been trying to improve her cholesterol levels with intensive lifestyle modification including a low saturated fat diet, exercise and weight loss. She denies any chest pain, claudication or myalgias.  Lab Results  Component Value Date   ALT 8 06/13/2019   AST 16 06/13/2019   ALKPHOS 144 (H) 06/13/2019   BILITOT 0.3 06/13/2019   Lab Results  Component Value Date   CHOL 251 (H) 06/13/2019   HDL 50 06/13/2019   LDLCALC 185 (H) 06/13/2019   LDLDIRECT 108 (H) 12/01/2012   TRIG 94 06/13/2019   CHOLHDL 3.4 11/05/2017   Assessment/Plan:   1. Vitamin D deficiency Low Vitamin D level contributes to fatigue and are associated with obesity,  breast, and colon cancer. She agrees to continue to take prescription Vitamin D @50 ,000 IU every week and will follow-up for routine testing of vitamin D, at least 2-3 times per year to avoid over-replacement.  Orders - Vitamin D (25 hydroxy) - Vitamin D, Ergocalciferol, (DRISDOL) 1.25 MG (50000 UNIT) CAPS capsule; Take 1 capsule (50,000 Units total) by mouth every 7 (seven) days.  Dispense: 4 capsule; Refill: 0  2. Insomnia, unspecified type The problem of recurrent insomnia was discussed. Orders and follow up as documented in patient record. Counseling: Intensive lifestyle modifications are the first line treatment for this issue. We discussed several lifestyle modifications today and she will continue to work on diet, exercise and weight loss efforts.  I will also start the patient on gabapentin, as noted below, to see if this helps improve her sleep.  Counseling  Limit or avoid alcohol, caffeinated beverages, and cigarettes, especially close to bedtime.   Do not eat a large meal or eat spicy foods right before bedtime. This can lead to digestive discomfort that can make it hard for you to sleep.  Keep a sleep diary to help you and your health care provider figure out what could be causing your insomnia.  . Make your bedroom a dark, comfortable place where it is easy to fall asleep. ? Put up shades or blackout curtains to block light from outside. ? Use a white noise machine to block noise. ? Keep the temperature cool. . Limit screen use before bedtime. This includes: ? Watching TV. ?  Using your smartphone, tablet, or computer. . Stick to a routine that includes going to bed and waking up at the same times every day and night. This can help you fall asleep faster. Consider making a quiet activity, such as reading, part of your nighttime routine. . Try to avoid taking naps during the day so that you sleep better at night. . Get out of bed if you are still awake after 15 minutes of trying  to sleep. Keep the lights down, but try reading or doing a quiet activity. When you feel sleepy, go back to bed.  3. Vasomotor symptoms due to menopause Will try the patient on gabapentin to see if it helps with her symptoms.  Orders - Comprehensive Metabolic Panel (CMET) - CBC w/Diff/Platelet - gabapentin (NEURONTIN) 100 MG capsule; Take 1 capsule (100 mg total) by mouth at bedtime.  Dispense: 30 capsule; Refill: 0  4. Other hyperlipidemia Cardiovascular risk and specific lipid/LDL goals reviewed.  We discussed several lifestyle modifications today and [name] will continue to work on diet, exercise and weight loss efforts. Orders and follow up as documented in patient record.  Counseling Intensive lifestyle modifications are the first line treatment for this issue. . Dietary changes: Increase soluble fiber. Decrease simple carbohydrates. . Exercise changes: Moderate to vigorous-intensity aerobic activity 150 minutes per week if tolerated. . Lipid-lowering medications: see documented in medical record.  Orders - Comprehensive Metabolic Panel (CMET) - CBC w/Diff/Platelet - Insulin, random - Lipid Panel With LDL/HDL Ratio  5. Class 3 severe obesity with serious comorbidity and body mass index (BMI) of 45.0 to 49.9 in adult, unspecified obesity type ([place]) April [name] is currently in the action stage of change. As such, her goal is to continue with weight loss efforts. She has agreed to on the Category 2 Plan.  She should concentrate on following the plan 80% of the time.  We discussed the following exercise goals today: For substantial health benefits, adults should do at least 150 minutes (2 hours and 30 minutes) a week of moderate-intensity, or 75 minutes (1 hour and 15 minutes) a week of vigorous-intensity aerobic physical activity, or an equivalent combination of moderate- and vigorous-intensity aerobic activity. Aerobic activity should be performed in episodes of at least 10 minutes, and  preferably, it should be spread throughout the week. Adults should also include muscle-strengthening activities that involve all major muscle groups on 2 or more days a week.  We discussed the following behavioral modification strategies today: increasing lean protein intake and increasing water intake.  Zandria R Buenafe has agreed to follow-up with our clinic in 2 weeks. She was informed of the importance of frequent follow-up visits to maximize her success with intensive lifestyle modifications for her multiple health conditions.   Shakiah was informed we would discuss her lab results at her next visit unless there is a critical issue that needs to be addressed sooner. Apple agreed to keep her next visit at the agreed upon time to discuss these results.  Objective:   Blood pressure 109/75, pulse 73, temperature 98.1 F (36.7 C), temperature source Oral, height 5' (1.524 m), weight 251 lb (113.9 kg), SpO2 97 %. Body mass index is 49.02 kg/m.  General: Cooperative, alert, well developed, in no acute distress. HEENT: Conjunctivae and lids unremarkable. Neck: No thyromegaly.  Cardiovascular: Regular rhythm.  Lungs: Normal work of breathing. Extremities: No edema.  Neurologic: No focal deficits.   Lab Results  Component Value Date   CREATININE 0.58 06/13/2019   BUN  14 06/13/2019   NA 141 06/13/2019   K 4.3 06/13/2019   CL 102 06/13/2019   CO2 22 06/13/2019   Lab Results  Component Value Date   ALT 8 06/13/2019   AST 16 06/13/2019   ALKPHOS 144 (H) 06/13/2019   BILITOT 0.3 06/13/2019   Lab Results  Component Value Date   HGBA1C 5.4 06/13/2019   HGBA1C 5.2 11/05/2017   HGBA1C 5.4 03/02/2016   HGBA1C 5.3 02/22/2015   HGBA1C 5.4 12/22/2013   Lab Results  Component Value Date   INSULIN 9.5 06/13/2019   Lab Results  Component Value Date   TSH 1.420 06/01/2018   Lab Results  Component Value Date   CHOL 251 (H) 06/13/2019   HDL 50 06/13/2019   LDLCALC 185 (H)  06/13/2019   LDLDIRECT 108 (H) 12/01/2012   TRIG 94 06/13/2019   CHOLHDL 3.4 11/05/2017   Lab Results  Component Value Date   WBC 5.3 06/01/2018   HGB 13.5 06/01/2018   HCT 41.3 06/01/2018   MCV 84 06/01/2018   PLT 345 06/01/2018   Lab Results  Component Value Date   IRON 36 (L) 12/11/2009   TIBC 318 12/11/2009   FERRITIN 19 12/11/2009    Obesity Behavioral Intervention:   Approximately 15 minutes were spent on the discussion below.  ASK: We discussed the diagnosis of obesity with Teresa R Boothe today and Eleny agreed to give [place] permission to discuss obesity behavioral modification therapy today.  ASSESS: Nekayla has the diagnosis of obesity and her BMI today is 49.2. Judeen is in the action stage of change.   ADVISE: Terecita was educated on the multiple health risks of obesity as well as the benefit of weight loss to improve her health. She was advised of the need for long term treatment and the importance of lifestyle modifications to improve her current health and to decrease her risk of future health problems.  AGREE: Multiple dietary modification options and treatment options were discussed and Charlea agreed to follow the recommendations documented in the above note.  ARRANGE: Davelyn was educated on the importance of frequent visits to treat obesity as outlined per CMS and USPSTF guidelines and agreed to schedule her next follow up appointment today.  Attestation Statements:   Reviewed by clinician on day of visit: allergies, medications, problem list, medical history, surgical history, family history, social history, and previous encounter notes.  I, Water quality scientist, CMA, am acting as Location manager for [employer], DO.  I have reviewed the above documentation for accuracy and completeness, and I agree with the above. Briscoe Deutscher, DO

## 2019-10-25 ENCOUNTER — Encounter (INDEPENDENT_AMBULATORY_CARE_PROVIDER_SITE_OTHER): Payer: Self-pay | Admitting: Family Medicine

## 2019-10-26 LAB — COMPREHENSIVE METABOLIC PANEL
ALT: 11 IU/L (ref 0–32)
AST: 20 IU/L (ref 0–40)
Albumin/Globulin Ratio: 1.2 (ref 1.2–2.2)
Albumin: 4.6 g/dL (ref 3.8–4.9)
Alkaline Phosphatase: 170 IU/L — ABNORMAL HIGH (ref 39–117)
BUN/Creatinine Ratio: 27 — ABNORMAL HIGH (ref 9–23)
BUN: 16 mg/dL (ref 6–24)
Bilirubin Total: 0.3 mg/dL (ref 0.0–1.2)
CO2: 19 mmol/L — ABNORMAL LOW (ref 20–29)
Calcium: 9.6 mg/dL (ref 8.7–10.2)
Chloride: 104 mmol/L (ref 96–106)
Creatinine, Ser: 0.6 mg/dL (ref 0.57–1.00)
GFR calc Af Amer: 117 mL/min/{1.73_m2} (ref >59)
GFR calc non Af Amer: 102 mL/min/{1.73_m2} (ref >59)
Globulin, Total: 3.9 g/dL (ref 1.5–4.5)
Glucose: 75 mg/dL (ref 65–99)
Potassium: 4.5 mmol/L (ref 3.5–5.2)
Sodium: 141 mmol/L (ref 134–144)
Total Protein: 8.5 g/dL (ref 6.0–8.5)

## 2019-10-26 LAB — CBC WITH DIFFERENTIAL/PLATELET
Basophils Absolute: 0 10*3/uL (ref 0.0–0.2)
Basos: 0 %
EOS (ABSOLUTE): 0.1 10*3/uL (ref 0.0–0.4)
Eos: 2 %
Hematocrit: 45.4 % (ref 34.0–46.6)
Hemoglobin: 15.1 g/dL (ref 11.1–15.9)
Immature Grans (Abs): 0 10*3/uL (ref 0.0–0.1)
Immature Granulocytes: 0 %
Lymphocytes Absolute: 2.5 10*3/uL (ref 0.7–3.1)
Lymphs: 39 %
MCH: 28.2 pg (ref 26.6–33.0)
MCHC: 33.3 g/dL (ref 31.5–35.7)
MCV: 85 fL (ref 79–97)
Monocytes Absolute: 0.5 10*3/uL (ref 0.1–0.9)
Monocytes: 8 %
Neutrophils Absolute: 3.2 10*3/uL (ref 1.4–7.0)
Neutrophils: 51 %
Platelets: 354 10*3/uL (ref 150–450)
RBC: 5.36 x10E6/uL — ABNORMAL HIGH (ref 3.77–5.28)
RDW: 13.8 % (ref 11.7–15.4)
WBC: 6.4 10*3/uL (ref 3.4–10.8)

## 2019-10-26 LAB — LIPID PANEL WITH LDL/HDL RATIO
Cholesterol, Total: 235 mg/dL — ABNORMAL HIGH (ref 100–199)
HDL: 56 mg/dL (ref >39)
LDL Chol Calc (NIH): 156 mg/dL — ABNORMAL HIGH (ref 0–99)
LDL/HDL Ratio: 2.8 ratio (ref 0.0–3.2)
Triglycerides: 131 mg/dL (ref 0–149)
VLDL Cholesterol Cal: 23 mg/dL (ref 5–40)

## 2019-10-26 LAB — INSULIN, RANDOM: INSULIN: 8.6 u[IU]/mL (ref 2.6–24.9)

## 2019-10-26 LAB — VITAMIN D 25 HYDROXY (VIT D DEFICIENCY, FRACTURES): Vit D, 25-Hydroxy: 45.9 ng/mL (ref 30.0–100.0)

## 2019-11-06 ENCOUNTER — Encounter (INDEPENDENT_AMBULATORY_CARE_PROVIDER_SITE_OTHER): Payer: Self-pay | Admitting: Family Medicine

## 2019-11-06 ENCOUNTER — Ambulatory Visit (INDEPENDENT_AMBULATORY_CARE_PROVIDER_SITE_OTHER): Payer: Medicaid Other | Admitting: Family Medicine

## 2019-11-06 ENCOUNTER — Other Ambulatory Visit: Payer: Self-pay

## 2019-11-06 VITALS — BP 113/76 | HR 66 | Temp 98.5°F | Ht 60.0 in | Wt 251.0 lb

## 2019-11-06 DIAGNOSIS — E559 Vitamin D deficiency, unspecified: Secondary | ICD-10-CM

## 2019-11-06 DIAGNOSIS — R609 Edema, unspecified: Secondary | ICD-10-CM

## 2019-11-06 DIAGNOSIS — E7849 Other hyperlipidemia: Secondary | ICD-10-CM

## 2019-11-06 DIAGNOSIS — R748 Abnormal levels of other serum enzymes: Secondary | ICD-10-CM | POA: Diagnosis not present

## 2019-11-06 DIAGNOSIS — N951 Menopausal and female climacteric states: Secondary | ICD-10-CM | POA: Diagnosis not present

## 2019-11-06 DIAGNOSIS — Z6841 Body Mass Index (BMI) 40.0 and over, adult: Secondary | ICD-10-CM

## 2019-11-06 DIAGNOSIS — M722 Plantar fascial fibromatosis: Secondary | ICD-10-CM | POA: Diagnosis not present

## 2019-11-06 MED ORDER — GABAPENTIN 100 MG PO CAPS: 100.0000 mg | ORAL_CAPSULE | Freq: Every day | ORAL | 0 refills | Status: DC

## 2019-11-06 MED ORDER — VITAMIN D (ERGOCALCIFEROL) 1.25 MG (50000 UNIT) PO CAPS: 50000.0000 [IU] | ORAL_CAPSULE | ORAL | 0 refills | Status: DC

## 2019-11-07 ENCOUNTER — Encounter (INDEPENDENT_AMBULATORY_CARE_PROVIDER_SITE_OTHER): Payer: Self-pay | Admitting: Family Medicine

## 2019-11-07 NOTE — Progress Notes (Signed)
Chief Complaint:   OBESITY April [name] is here to discuss her progress with her obesity treatment plan along with follow-up of her obesity related diagnoses. April [name] is on the Category 2 Plan and states she is following her eating plan approximately 70-80% of the time. April [name] states she is walking for 20-20 minutes 2-3 times per week.  Today's visit was #: 9 Starting weight: 256 lbs Starting date: 06/13/2019 Today's weight: 251 lbs Today's date: 11/06/2019 Total lbs lost to date: 5 lbs Total lbs lost since last in-office visit: 0  Interim History: April [name] reports that she has been adhering to her diet.  Endorses that she may have had increased salt.  Subjective:   1. Vitamin D deficiency Love's Vitamin D level was 45.9 on 10/24/2019. She is currently taking vit D. She denies nausea, vomiting or muscle weakness.  2. Vasomotor symptoms due to menopause April [name] has been having vasomotor symptoms due to menopause.  3. Other hyperlipidemia April [name] has hyperlipidemia and has been trying to improve her cholesterol levels with intensive lifestyle modification including a low saturated fat diet, exercise and weight loss. She denies any chest pain, claudication or myalgias.  Lab Results  Component Value Date   ALT 11 10/24/2019   AST 20 10/24/2019   ALKPHOS 170 (H) 10/24/2019   BILITOT 0.3 10/24/2019   Lab Results  Component Value Date   CHOL 235 (H) 10/24/2019   HDL 56 10/24/2019   LDLCALC 156 (H) 10/24/2019   LDLDIRECT 108 (H) 12/01/2012   TRIG 131 10/24/2019   CHOLHDL 3.4 11/05/2017   4. Elevated alkaline phosphatase level Patient denies RUQ pain.   Lab Results  Component Value Date   ALKPHOS 170 (H) 10/24/2019   5. Plantar fasciitis April [name] has plantar fasciitis in her right foot.  6. Edema April [name] has some excess swelling in her extremities.  Assessment/Plan:   1. Vitamin D deficiency Low Vitamin D level contributes to fatigue and are associated with  obesity, breast, and colon cancer. She agrees to continue to take prescription Vitamin D @50 ,000 IU every week and will follow-up for routine testing of Vitamin D, at least 2-3 times per year to avoid over-replacement.  Orders - Vitamin D, Ergocalciferol, (DRISDOL) 1.25 MG (50000 UNIT) CAPS capsule; Take 1 capsule (50,000 Units total) by mouth every 7 (seven) days.  Dispense: 4 capsule; Refill: 0  2. Vasomotor symptoms due to menopause Will start gabapentin, as below.  Orders - gabapentin (NEURONTIN) 100 MG capsule; Take 1 capsule (100 mg total) by mouth at bedtime.  Dispense: 30 capsule; Refill: 0  3. Other hyperlipidemia Cardiovascular risk and specific lipid/LDL goals reviewed.  We discussed several lifestyle modifications today and April [name] will continue to work on diet, exercise and weight loss efforts. Orders and follow up as documented in patient record.   Counseling Intensive lifestyle modifications are the first line treatment for this issue. . Dietary changes: Increase soluble fiber. Decrease simple carbohydrates. . Exercise changes: Moderate to vigorous-intensity aerobic activity 150 minutes per week if tolerated. . Lipid-lowering medications: see documented in medical record.  4. Elevated alkaline phosphatase level Will monitor.  5. Plantar fasciitis Reviewed symptomatic care and treatment options.  6. Edema After discussion, patient would like to start below medication. Expectations, risks, and potential side effects reviewed.   Orders - hydrochlorothiazide (HYDRODIURIL) 25 MG tablet; Take 1 tablet (25 mg total) by mouth daily.  Dispense: 30 tablet; Refill: 0  7. Class 3 severe obesity with serious comorbidity and body mass index (  BMI) of 45.0 to 49.9 in adult, unspecified obesity type ([facility]) April [name] is currently in the action stage of change. As such, her goal is to continue with weight loss efforts. She has agreed to the Category 2 Plan.   Exercise goals: All adults  should avoid inactivity. Some physical activity is better than none, and adults who participate in any amount of physical activity gain some health benefits. For additional and more extensive health benefits, adults should increase their aerobic physical activity to 300 minutes (5 hours) a week of moderate-intensity, or 150 minutes a week of vigorous-intensity aerobic physical activity, or an equivalent combination of moderate- and vigorous-intensity activity. Additional health benefits are gained by engaging in physical activity beyond this amount.   Behavioral modification strategies: increasing lean protein intake, decreasing simple carbohydrates, increasing vegetables and increasing water intake.  April [name] has agreed to follow-up with our clinic in 2 weeks. She was informed of the importance of frequent follow-up visits to maximize her success with intensive lifestyle modifications for her multiple health conditions.   Objective:   Blood pressure 113/76, pulse 66, temperature 98.5 F (36.9 C), temperature source Oral, height 5' (1.524 m), weight 251 lb (113.9 kg), SpO2 97 %. Body mass index is 49.02 kg/m.  General: Cooperative, alert, well developed, in no acute distress. HEENT: Conjunctivae and lids unremarkable. Cardiovascular: Regular rhythm.  Lungs: Normal work of breathing. Neurologic: No focal deficits.   Lab Results  Component Value Date   CREATININE 0.60 10/24/2019   BUN 16 10/24/2019   NA 141 10/24/2019   K 4.5 10/24/2019   CL 104 10/24/2019   CO2 19 (L) 10/24/2019   Lab Results  Component Value Date   ALT 11 10/24/2019   AST 20 10/24/2019   ALKPHOS 170 (H) 10/24/2019   BILITOT 0.3 10/24/2019   Lab Results  Component Value Date   HGBA1C 5.4 06/13/2019   HGBA1C 5.2 11/05/2017   HGBA1C 5.4 03/02/2016   HGBA1C 5.3 02/22/2015   HGBA1C 5.4 12/22/2013   Lab Results  Component Value Date   INSULIN 8.6 10/24/2019   INSULIN 9.5 06/13/2019   Lab Results  Component  Value Date   TSH 1.420 06/01/2018   Lab Results  Component Value Date   CHOL 235 (H) 10/24/2019   HDL 56 10/24/2019   LDLCALC 156 (H) 10/24/2019   LDLDIRECT 108 (H) 12/01/2012   TRIG 131 10/24/2019   CHOLHDL 3.4 11/05/2017   Lab Results  Component Value Date   WBC 6.4 10/24/2019   HGB 15.1 10/24/2019   HCT 45.4 10/24/2019   MCV 85 10/24/2019   PLT 354 10/24/2019   Lab Results  Component Value Date   IRON 36 (L) 12/11/2009   TIBC 318 12/11/2009   FERRITIN 19 12/11/2009   Attestation Statements:   Reviewed by clinician on day of visit: allergies, medications, problem list, medical history, surgical history, family history, social history, and previous encounter notes.  I performed a medically necessary appropriate examination and/or evaluation. I discussed the assessment and treatment plan with the patient. The patient was provided an opportunity to ask questions and all were answered. The patient agreed with the plan and demonstrated an understanding of the instructions. Clinical information was updated and documented in the EMR. Time spent on visit including pre-visit chart review and post-visit care was 45 minutes.  I, Water quality scientist, CMA, am acting as Location manager for [employer], DO.  I have reviewed the above documentation for accuracy and completeness, and I agree with the  above. -  Briscoe Deutscher, DO

## 2019-11-08 ENCOUNTER — Encounter (INDEPENDENT_AMBULATORY_CARE_PROVIDER_SITE_OTHER): Payer: Self-pay | Admitting: Family Medicine

## 2019-11-08 ENCOUNTER — Other Ambulatory Visit: Payer: Self-pay | Admitting: Family Medicine

## 2019-11-08 DIAGNOSIS — M170 Bilateral primary osteoarthritis of knee: Secondary | ICD-10-CM

## 2019-11-08 MED ORDER — HYDROCHLOROTHIAZIDE 25 MG PO TABS: 25.0000 mg | ORAL_TABLET | Freq: Every day | ORAL | 0 refills | Status: DC

## 2019-11-09 ENCOUNTER — Telehealth: Payer: Self-pay

## 2019-11-09 NOTE — Telephone Encounter (Signed)
Pt calls nurse line in regards to her refill request on Tramadol. Pt states that she went to pharmacy today and that it was too soon to get her refill. Pt states that she takes one pill about every other day. Pt still has a few pills left, however, she wanted to go ahead and have refill sent in before she runs out.   How long is this prescription supposed to last her?   To PCP.  Talbot Grumbling, RN

## 2019-11-09 NOTE — Telephone Encounter (Signed)
Please inform patient that since tramadol is a controlled substance I cannot refill early. She will be due at the beginning of February. At that time she should call her pharmacy to request a refill  Caroline More, DO, PGY-3 [facility] 11/09/2019 7:38 PM

## 2019-11-10 NOTE — Telephone Encounter (Signed)
Called and informed patient that her RX for tramadol will be filled on 11/20/2019.  Patient verbalized understanding.  Ozella Almond, [place]

## 2019-11-17 ENCOUNTER — Telehealth: Payer: Self-pay | Admitting: *Deleted

## 2019-11-17 NOTE — Telephone Encounter (Signed)
LVM to call office to go over screening questions prior to visit on Monday.April [name]

## 2019-11-20 ENCOUNTER — Ambulatory Visit
Admission: RE | Admit: 2019-11-20 | Discharge: 2019-11-20 | Disposition: A | Discharge status: Home / Self Care | Payer: Medicaid Other | Source: Ambulatory Visit | Attending: Family Medicine | Admitting: Family Medicine

## 2019-11-20 ENCOUNTER — Encounter: Payer: Self-pay | Admitting: Family Medicine

## 2019-11-20 ENCOUNTER — Other Ambulatory Visit: Payer: Self-pay

## 2019-11-20 ENCOUNTER — Ambulatory Visit (INDEPENDENT_AMBULATORY_CARE_PROVIDER_SITE_OTHER): Payer: Medicaid Other | Admitting: Family Medicine

## 2019-11-20 VITALS — BP 105/80 | HR 91 | Wt 251.0 lb

## 2019-11-20 DIAGNOSIS — M170 Bilateral primary osteoarthritis of knee: Secondary | ICD-10-CM | POA: Diagnosis not present

## 2019-11-20 DIAGNOSIS — M79671 Pain in right foot: Secondary | ICD-10-CM

## 2019-11-20 DIAGNOSIS — M7731 Calcaneal spur, right foot: Secondary | ICD-10-CM | POA: Diagnosis not present

## 2019-11-20 MED ORDER — TRAMADOL HCL 50 MG PO TABS: 50.0000 mg | ORAL_TABLET | Freq: Every day | ORAL | 0 refills | Status: DC | PRN

## 2019-11-20 NOTE — Patient Instructions (Signed)
It was a pleasure seeing you today.   Today we discussed your heel pain  For your heel pain: I would like to get an x-ray of her foot to make sure nothing is broken.  I would recommend using an over-the-counter heel pad or shoe insert to help with your arch support and heel pain.  Continue to use rest and ice as well.  I do recommend still continuing to walk as tolerated as you are trying to live a healthier lifestyle.  For your knee pain: I have refilled your tramadol.  For the shingles vaccine: please call your local pharmacy to ensure it will be covered by your health insurance. We unfortunately do not have any here so you will have to go to your pharmacy to get this  Please follow up in 1 month for your heel pain or sooner if symptoms persist or worsen. Please call the clinic immediately if you have any concerns.   Our clinic's number is [phone]. Please call with questions or concerns.   Please go to the emergency room if you have chest pain or shortness of breath   Thank you,  Caroline More, DO

## 2019-11-20 NOTE — Progress Notes (Addendum)
   CHIEF COMPLAINT / HPI:  Heel pain Patient with right heel pain.  This has been on and off for 1 month.  States that it comes and goes.  Is worse after walking for 30 to 40 minutes or after long periods of standing.  Patient has been trying to exercise more to help lose weight.  Does not radiate and is only on her heel.  Does not use any inserts but does think she has flat feet.  Denies any trauma to the area.  Knee pain Patient is seeing orthopedics for this.  Is attempting to lose weight as she is unable to get knee replacement without weight loss.  Is hoping to avoid this altogether as she thinks if she loses some more weight she will not need surgery.  Is using tramadol as needed when she exercises to help with pain.  States that tramadol helps to keep her more active and exercise daily.  PERTINENT  PMH / PSH: osteoarthritis, obesity,    OBJECTIVE: BP 105/80   Pulse 91   Wt 251 lb (113.9 kg)   SpO2 96%   BMI 49.02 kg/m   Gen: awake and alert, NAD Foot: Inspection:  No obvious bony deformity.  No swelling, erythema, or bruising.  Flat transverse and longitudinal arch  Palpation: No tenderness to palpation ROM: Full  ROM of the ankle. Normal midfoot flexibility Strength: 5/5 strength ankle in all planes Neurovascular: N/V intact distally in the lower extremity Special tests: Negative anterior drawer. Negative squeeze. normal midfoot flexibility. Normal calcaneal motion with heel raise   ASSESSMENT / PLAN:  Heel pain Patient with right heel pain.  Differentials include calcaneal heel spur versus fracture.  Patient is exercising more.  Will obtain DG foot to rule out fracture.  Update, no fracture on x-ray but did show heel spur.  Advised patient to use heel pad as well as arch support orthotics.  Advised rest as well as ice.  Patient has already purchased heel pad so we will continue to use them.  If no improvement will need to follow-up and may benefit from sports medicine  referral for green inserts versus custom orthotics.  Advised patient of cost of these so she will try over-the-counter versions first.  Strict return precautions given.  Follow-up in 1 month, sooner if worsening.  Osteoarthritis of knees, bilateral Patient with improvement with tramadol, this allows her to stay active and exercise daily.  Rx for tramadol sent.    Patient is interested in shingles vaccine.  Will call her pharmacy to obtain this.  Caroline More, DO  PGY-3 [place]

## 2019-11-21 NOTE — Assessment & Plan Note (Signed)
Patient with right heel pain.  Differentials include calcaneal heel spur versus fracture.  Patient is exercising more.  Will obtain DG foot to rule out fracture.  Update, no fracture on x-ray but did show heel spur.  Advised patient to use heel pad as well as arch support orthotics.  Advised rest as well as ice.  Patient has already purchased heel pad so we will continue to use them.  If no improvement will need to follow-up and may benefit from sports medicine referral for green inserts versus custom orthotics.  Advised patient of cost of these so she will try over-the-counter versions first.  Strict return precautions given.  Follow-up in 1 month, sooner if worsening.

## 2019-11-21 NOTE — Assessment & Plan Note (Signed)
Patient with improvement with tramadol, this allows her to stay active and exercise daily.  Rx for tramadol sent.

## 2019-11-22 ENCOUNTER — Ambulatory Visit (INDEPENDENT_AMBULATORY_CARE_PROVIDER_SITE_OTHER): Payer: Medicaid Other | Admitting: Family Medicine

## 2019-11-22 ENCOUNTER — Encounter (INDEPENDENT_AMBULATORY_CARE_PROVIDER_SITE_OTHER): Payer: Self-pay | Admitting: Family Medicine

## 2019-11-22 ENCOUNTER — Other Ambulatory Visit: Payer: Self-pay

## 2019-11-22 VITALS — BP 96/62 | HR 66 | Temp 98.4°F | Ht 60.0 in | Wt 246.0 lb

## 2019-11-22 DIAGNOSIS — N951 Menopausal and female climacteric states: Secondary | ICD-10-CM | POA: Diagnosis not present

## 2019-11-22 DIAGNOSIS — R609 Edema, unspecified: Secondary | ICD-10-CM | POA: Diagnosis not present

## 2019-11-22 DIAGNOSIS — R7989 Other specified abnormal findings of blood chemistry: Secondary | ICD-10-CM

## 2019-11-22 DIAGNOSIS — Z6841 Body Mass Index (BMI) 40.0 and over, adult: Secondary | ICD-10-CM

## 2019-11-22 DIAGNOSIS — E559 Vitamin D deficiency, unspecified: Secondary | ICD-10-CM | POA: Diagnosis not present

## 2019-11-22 MED ORDER — HYDROCHLOROTHIAZIDE 25 MG PO TABS: 25.0000 mg | ORAL_TABLET | Freq: Every day | ORAL | 0 refills | Status: DC

## 2019-11-22 MED ORDER — VITAMIN D (ERGOCALCIFEROL) 1.25 MG (50000 UNIT) PO CAPS: 50000.0000 [IU] | ORAL_CAPSULE | ORAL | 0 refills | Status: DC

## 2019-11-22 NOTE — Progress Notes (Signed)
Chief Complaint:   OBESITY April [name] is here to discuss her progress with her obesity treatment plan along with follow-up of her obesity related diagnoses. April [name] is on the Category 2 Plan and states she is following her eating plan approximately 80% of the time. April [name] states she is walking for 30-40 minutes 2-3 times per week.  Today's visit was #: 10 Starting weight: 256 lbs Starting date: 06/13/2019 Today's weight: 246 lbs Today's date: 11/22/2019 Total lbs lost to date: 10 lbs Total lbs lost since last in-office visit: 5 lbs  Interim History: April [name] is happy to be down 5 pounds.  She says that she feels better.  Subjective:   1. Vasomotor symptoms due to menopause April [name] has been taking Neurontin 100 mg at bedtime as needed to help with her vasomotor symptoms.  2. Vitamin D deficiency April [name]'s Vitamin D level was 45.9 on 10/24/2019. She is currently taking vit D. She denies nausea, vomiting or muscle weakness.  3. Edema, unspecified type April [name] is taking HCTZ for her bilateral lower extremity edema.  4. Elevated LFTs April [name] has a new dx of elevated ALT. Her BMI is over 40. She denies abdominal pain or jaundice and has never been told of any liver problems in the past. She denies excessive alcohol intake.  Lab Results  Component Value Date   ALT 11 10/24/2019   AST 20 10/24/2019   ALKPHOS 170 (H) 10/24/2019   BILITOT 0.3 10/24/2019   Assessment/Plan:   1. Vasomotor symptoms due to menopause Gabapentin helping but causes mild morning sedation. She wants to continue for now. Will monitor.  2. Vitamin D deficiency Low Vitamin D level contributes to fatigue and are associated with obesity, breast, and colon cancer. She agrees to continue to take prescription Vitamin D @50 ,000 IU every week and will follow-up for routine testing of Vitamin D, at least 2-3 times per year to avoid over-replacement.  Orders - Vitamin D, Ergocalciferol, (DRISDOL) 1.25 MG  (50000 UNIT) CAPS capsule; Take 1 capsule (50,000 Units total) by mouth every 7 (seven) days.  Dispense: 4 capsule; Refill: 0  3. Edema Improved. Continue HCTZ, as below.  Orders - hydrochlorothiazide (HYDRODIURIL) 25 MG tablet; Take 1 tablet (25 mg total) by mouth daily.  Dispense: 90 tablet; Refill: 0  4. Elevated LFTs April [name] was educated the importance of weight loss. April [name] agreed to continue with her weight loss efforts with healthier diet and exercise as an essential part of her treatment plan.  5. Class 3 severe obesity with serious comorbidity and body mass index (BMI) of 45.0 to 49.9 in adult, unspecified obesity type (April [name]) April [name] is currently in the action stage of change. As such, her goal is to continue with weight loss efforts. She has agreed to the Category 2 Plan.   Exercise goals: For substantial health benefits, adults should do at least 150 minutes (2 hours and 30 minutes) a week of moderate-intensity, or 75 minutes (1 hour and 15 minutes) a week of vigorous-intensity aerobic physical activity, or an equivalent combination of moderate- and vigorous-intensity aerobic activity. Aerobic activity should be performed in episodes of at least 10 minutes, and preferably, it should be spread throughout the week. Adults should also include muscle-strengthening activities that involve all major muscle groups on 2 or more days a week.  Behavioral modification strategies: increasing lean protein intake and decreasing liquid calories.  April [name] has agreed to follow-up with our clinic in 2 weeks. She was informed of the importance of frequent follow-up visits  to maximize her success with intensive lifestyle modifications for her multiple health conditions.   Objective:   Blood pressure 96/62, pulse 66, temperature 98.4 F (36.9 C), temperature source Oral, height 5' (1.524 m), weight 246 lb (111.6 kg), SpO2 97 %. Body mass index is 48.04 kg/m.  General: Cooperative, alert, well  developed, in no acute distress. HEENT: Conjunctivae and lids unremarkable. Cardiovascular: Regular rhythm.  Lungs: Normal work of breathing. Neurologic: No focal deficits.   Lab Results  Component Value Date   CREATININE 0.60 10/24/2019   BUN 16 10/24/2019   NA 141 10/24/2019   K 4.5 10/24/2019   CL 104 10/24/2019   CO2 19 (L) 10/24/2019   Lab Results  Component Value Date   ALT 11 10/24/2019   AST 20 10/24/2019   ALKPHOS 170 (H) 10/24/2019   BILITOT 0.3 10/24/2019   Lab Results  Component Value Date   HGBA1C 5.4 06/13/2019   HGBA1C 5.2 11/05/2017   HGBA1C 5.4 03/02/2016   HGBA1C 5.3 02/22/2015   HGBA1C 5.4 12/22/2013   Lab Results  Component Value Date   INSULIN 8.6 10/24/2019   INSULIN 9.5 06/13/2019   Lab Results  Component Value Date   TSH 1.420 06/01/2018   Lab Results  Component Value Date   CHOL 235 (H) 10/24/2019   HDL 56 10/24/2019   LDLCALC 156 (H) 10/24/2019   LDLDIRECT 108 (H) 12/01/2012   TRIG 131 10/24/2019   CHOLHDL 3.4 11/05/2017   Lab Results  Component Value Date   WBC 6.4 10/24/2019   HGB 15.1 10/24/2019   HCT 45.4 10/24/2019   MCV 85 10/24/2019   PLT 354 10/24/2019   Lab Results  Component Value Date   IRON 36 (L) 12/11/2009   TIBC 318 12/11/2009   FERRITIN 19 12/11/2009   Attestation Statements:   Reviewed by clinician on day of visit: allergies, medications, problem list, medical history, surgical history, family history, social history, and previous encounter notes.  I, Water quality scientist, CMA, am acting as Location manager for [employer], DO.  I have reviewed the above documentation for accuracy and completeness, and I agree with the above. Briscoe Deutscher, DO

## 2019-11-23 LAB — COMPREHENSIVE METABOLIC PANEL
ALT: 12 IU/L (ref 0–32)
AST: 22 IU/L (ref 0–40)
Albumin/Globulin Ratio: 1.2 (ref 1.2–2.2)
Albumin: 4.3 g/dL (ref 3.8–4.9)
Alkaline Phosphatase: 161 IU/L — ABNORMAL HIGH (ref 39–117)
BUN/Creatinine Ratio: 22 (ref 9–23)
BUN: 15 mg/dL (ref 6–24)
Bilirubin Total: 0.2 mg/dL (ref 0.0–1.2)
CO2: 22 mmol/L (ref 20–29)
Calcium: 9.5 mg/dL (ref 8.7–10.2)
Chloride: 101 mmol/L (ref 96–106)
Creatinine, Ser: 0.68 mg/dL (ref 0.57–1.00)
GFR calc Af Amer: 112 mL/min/{1.73_m2} (ref >59)
GFR calc non Af Amer: 97 mL/min/{1.73_m2} (ref >59)
Globulin, Total: 3.6 g/dL (ref 1.5–4.5)
Glucose: 87 mg/dL (ref 65–99)
Potassium: 3.9 mmol/L (ref 3.5–5.2)
Sodium: 141 mmol/L (ref 134–144)
Total Protein: 7.9 g/dL (ref 6.0–8.5)

## 2019-11-23 NOTE — Addendum Note (Signed)
Addended by: Briscoe Deutscher R on: 11/23/2019 01:42 PM   Modules accepted: Orders

## 2019-12-05 ENCOUNTER — Other Ambulatory Visit (INDEPENDENT_AMBULATORY_CARE_PROVIDER_SITE_OTHER): Payer: Self-pay | Admitting: Family Medicine

## 2019-12-05 DIAGNOSIS — R609 Edema, unspecified: Secondary | ICD-10-CM

## 2019-12-06 ENCOUNTER — Other Ambulatory Visit: Payer: Self-pay

## 2019-12-06 ENCOUNTER — Encounter: Payer: Self-pay | Admitting: Family Medicine

## 2019-12-06 ENCOUNTER — Ambulatory Visit (INDEPENDENT_AMBULATORY_CARE_PROVIDER_SITE_OTHER): Payer: Medicaid Other | Admitting: Family Medicine

## 2019-12-06 VITALS — BP 128/70 | HR 70

## 2019-12-06 DIAGNOSIS — M170 Bilateral primary osteoarthritis of knee: Secondary | ICD-10-CM | POA: Diagnosis not present

## 2019-12-06 MED ORDER — METHYLPREDNISOLONE ACETATE 80 MG/ML IJ SUSP
40.0000 mg | Freq: Once | INTRAMUSCULAR | Status: AC
  Administered 2019-12-06: 40 mg via INTRAMUSCULAR

## 2019-12-06 MED ORDER — DICLOFENAC SODIUM 1 % EX GEL: 2.0000 g | Freq: Four times a day (QID) | CUTANEOUS | 1 refills | Status: DC

## 2019-12-06 NOTE — Assessment & Plan Note (Signed)
-  Depo-Medrol injection provided today (1: 4 Depo-Medrol: Lidocaine) -Voltaren gel prescribed -Continue with weight loss plan

## 2019-12-06 NOTE — Progress Notes (Signed)
    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  She is coming today for pain of her left knee.  Roughly 1 year ago, she received a steroid injection in her left knee which she found to be incredibly helpful for her arthritis pain.  That injection seem to be helpful until the past 2 months when her pain started slowly returning.  She now experiences an achy, throbbing pain in her left knee that she notices most with walking.  She does have occasional clicking but has not had any locking sensation of her knee.  This pain is consistent with all of her previous osteoarthritis pain and is not changing in character.  She is aware that her weight is affecting her osteoarthritis and is currently working on a weight loss plan.  PERTINENT  PMH / PSH: Arthritis of the knees bilaterally, knee replacement of the right knee.  OBJECTIVE:   BP 128/70   Pulse 70   SpO2 97%   Knee: Normal to inspection with no erythema or effusion or obvious bony abnormalities. Palpation normal with no warmth or joint line tenderness or patellar tenderness or condyle tenderness. ROM normal in flexion and extension and lower leg rotation. Patellar tendon reflex equal bilaterally   ASSESSMENT/PLAN:   Osteoarthritis of knees, bilateral -Depo-Medrol injection provided today (1: 4 Depo-Medrol: Lidocaine) -Voltaren gel prescribed -Continue with weight loss plan     Matilde Haymaker, MD [place]

## 2019-12-06 NOTE — Patient Instructions (Signed)
So nice to meet you today.  I expect you to have some good relief from the injection today.  The short acting medication will wear off in a day or two but the steroid will kick in after that and hopefully give you some relief for months.  Please try using the topical Voltaren get for mild pain of the knee.  I think you will find it helpful.

## 2019-12-12 ENCOUNTER — Other Ambulatory Visit: Payer: Self-pay

## 2019-12-12 ENCOUNTER — Ambulatory Visit (INDEPENDENT_AMBULATORY_CARE_PROVIDER_SITE_OTHER): Payer: Medicaid Other | Admitting: Family Medicine

## 2019-12-12 ENCOUNTER — Encounter (INDEPENDENT_AMBULATORY_CARE_PROVIDER_SITE_OTHER): Payer: Self-pay | Admitting: Family Medicine

## 2019-12-12 ENCOUNTER — Other Ambulatory Visit: Payer: Self-pay | Admitting: Family Medicine

## 2019-12-12 VITALS — BP 116/82 | HR 79 | Temp 97.9°F | Ht 60.0 in | Wt 247.0 lb

## 2019-12-12 DIAGNOSIS — Z6841 Body Mass Index (BMI) 40.0 and over, adult: Secondary | ICD-10-CM | POA: Diagnosis not present

## 2019-12-12 DIAGNOSIS — R748 Abnormal levels of other serum enzymes: Secondary | ICD-10-CM | POA: Diagnosis not present

## 2019-12-12 DIAGNOSIS — E559 Vitamin D deficiency, unspecified: Secondary | ICD-10-CM

## 2019-12-12 DIAGNOSIS — R609 Edema, unspecified: Secondary | ICD-10-CM

## 2019-12-12 DIAGNOSIS — N951 Menopausal and female climacteric states: Secondary | ICD-10-CM | POA: Diagnosis not present

## 2019-12-12 DIAGNOSIS — M170 Bilateral primary osteoarthritis of knee: Secondary | ICD-10-CM

## 2019-12-12 MED ORDER — VITAMIN D (ERGOCALCIFEROL) 1.25 MG (50000 UNIT) PO CAPS: 50000.0000 [IU] | ORAL_CAPSULE | ORAL | 0 refills | Status: DC

## 2019-12-12 MED ORDER — HYDROCHLOROTHIAZIDE 25 MG PO TABS: 25.0000 mg | ORAL_TABLET | Freq: Every day | ORAL | 0 refills | Status: DC

## 2019-12-12 NOTE — Progress Notes (Signed)
Chief Complaint:   OBESITY April [name] is here to discuss her progress with her obesity treatment plan along with follow-up of her obesity related diagnoses. April [name] is on the Category 2 Plan and states she is following her eating plan approximately 85% of the time. April [name] states she is walking for 40 minutes 3-4 times per week.  Today's visit was #: 11 Starting weight: 256 lbs Starting date: 06/13/2019 Today's weight: 247 lbs Today's date: 12/12/2019 Total lbs lost to date: 9 lbs Total lbs lost since last in-office visit: 0  Interim History: April [name] provided the following food recall today: Breakfast:  Lately does not want breakfast.                    First meal around 10 am will be [place] meat, bread, cheese, mayo. Snack:        Orange Dinner:       Around 4 ounces of chicken, noodles (cooked in water with no milk/butter), vegetables with decreased salt seasoning.  April [name] says she has had increased anxiety and has decreased her snacking.  Subjective:   1. Vitamin D deficiency April [name]'s Vitamin D level was 45.9 on 10/24/2019. She is currently taking vit D. She denies nausea, vomiting or muscle weakness.  2. Edema, unspecified type Edema is in bilateral lower extremities.  She is taking HCTZ.  3. Elevated alkaline phosphatase level Lab Results  Component Value Date   ALKPHOS 161 (H) 11/22/2019   4. Vasomotor symptoms due to menopause April [name] has been taking Neurontin 100 mg at bedtime as needed to help with her vasomotor symptoms.  Assessment/Plan:   1. Vitamin D deficiency Low Vitamin D level contributes to fatigue and are associated with obesity, breast, and colon cancer. She agrees to continue to take prescription Vitamin D @50 ,000 IU every week and will follow-up for routine testing of Vitamin D, at least 2-3 times per year to avoid over-replacement.  2. Edema, unspecified type Will refill HCTZ today.  3. Elevated alkaline phosphatase level RUQ ultrasound  has already been ordered.  4. Vasomotor symptoms due to menopause Continue gabapentin for vasomotor symptoms.  5. Class 3 severe obesity with serious comorbidity and body mass index (BMI) of 45.0 to 49.9 in adult, unspecified obesity type ([place]) April [name] is currently in the action stage of change. As such, her goal is to continue with weight loss efforts. She has agreed to the Category 2 Plan.   Exercise goals: As is.  Behavioral modification strategies: increasing lean protein intake and increasing water intake.  April [name] has agreed to follow-up with our clinic in 2 weeks. She was informed of the importance of frequent follow-up visits to maximize her success with intensive lifestyle modifications for her multiple health conditions.   Objective:   Blood pressure 116/82, pulse 79, temperature 97.9 F (36.6 C), temperature source Oral, height 5' (1.524 m), weight 247 lb (112 kg), SpO2 99 %. Body mass index is 48.24 kg/m.  General: Cooperative, alert, well developed, in no acute distress. HEENT: Conjunctivae and lids unremarkable. Cardiovascular: Regular rhythm.  Lungs: Normal work of breathing. Neurologic: No focal deficits.   Lab Results  Component Value Date   CREATININE 0.68 11/22/2019   BUN 15 11/22/2019   NA 141 11/22/2019   K 3.9 11/22/2019   CL 101 11/22/2019   CO2 22 11/22/2019   Lab Results  Component Value Date   ALT 12 11/22/2019   AST 22 11/22/2019   ALKPHOS 161 (H) 11/22/2019   BILITOT  0.2 11/22/2019   Lab Results  Component Value Date   HGBA1C 5.4 06/13/2019   HGBA1C 5.2 11/05/2017   HGBA1C 5.4 03/02/2016   HGBA1C 5.3 02/22/2015   HGBA1C 5.4 12/22/2013   Lab Results  Component Value Date   INSULIN 8.6 10/24/2019   INSULIN 9.5 06/13/2019   Lab Results  Component Value Date   TSH 1.420 06/01/2018   Lab Results  Component Value Date   CHOL 235 (H) 10/24/2019   HDL 56 10/24/2019   LDLCALC 156 (H) 10/24/2019   LDLDIRECT 108 (H) 12/01/2012    TRIG 131 10/24/2019   CHOLHDL 3.4 11/05/2017   Lab Results  Component Value Date   WBC 6.4 10/24/2019   HGB 15.1 10/24/2019   HCT 45.4 10/24/2019   MCV 85 10/24/2019   PLT 354 10/24/2019   Lab Results  Component Value Date   IRON 36 (L) 12/11/2009   TIBC 318 12/11/2009   FERRITIN 19 12/11/2009    Obesity Behavioral Intervention:   Approximately 15 minutes were spent on the discussion below.  ASK: We discussed the diagnosis of obesity with [name] today and April [name] agreed to give April [name] permission to discuss obesity behavioral modification therapy today.  ASSESS: April [name] has the diagnosis of obesity and her BMI today is 48.4. April [name] is in the action stage of change.   ADVISE: [name] was educated on the multiple health risks of obesity as well as the benefit of weight loss to improve her health. She was advised of the need for long term treatment and the importance of lifestyle modifications to improve her current health and to decrease her risk of future health problems.  AGREE: Multiple dietary modification options and treatment options were discussed and April [name] agreed to follow the recommendations documented in the above note.  ARRANGE: April [name] was educated on the importance of frequent visits to treat obesity as outlined per CMS and USPSTF guidelines and agreed to schedule her next follow up appointment today.  Attestation Statements:   Reviewed by clinician on day of visit: allergies, medications, problem list, medical history, surgical history, family history, social history, and previous encounter notes.  I, Water quality scientist, CMA, am acting as Location manager for [employer], DO.  I have reviewed the above documentation for accuracy and completeness, and I agree with the above. April Deutscher, DO

## 2019-12-27 ENCOUNTER — Encounter (INDEPENDENT_AMBULATORY_CARE_PROVIDER_SITE_OTHER): Payer: Self-pay | Admitting: Physician Assistant

## 2019-12-27 ENCOUNTER — Other Ambulatory Visit: Payer: Self-pay

## 2019-12-27 ENCOUNTER — Ambulatory Visit (INDEPENDENT_AMBULATORY_CARE_PROVIDER_SITE_OTHER): Payer: Medicaid Other | Admitting: Physician Assistant

## 2019-12-27 VITALS — BP 105/73 | HR 88 | Temp 98.2°F | Ht 60.0 in | Wt 246.0 lb

## 2019-12-27 DIAGNOSIS — Z6841 Body Mass Index (BMI) 40.0 and over, adult: Secondary | ICD-10-CM

## 2019-12-27 DIAGNOSIS — I10 Essential (primary) hypertension: Secondary | ICD-10-CM

## 2019-12-27 DIAGNOSIS — E559 Vitamin D deficiency, unspecified: Secondary | ICD-10-CM

## 2019-12-27 MED ORDER — VITAMIN D (ERGOCALCIFEROL) 1.25 MG (50000 UNIT) PO CAPS: 50000.0000 [IU] | ORAL_CAPSULE | ORAL | 0 refills | Status: DC

## 2019-12-27 NOTE — Progress Notes (Signed)
Chief Complaint:   April [name] is here to discuss her progress with her obesity treatment plan along with follow-up of her obesity related diagnoses. April [name] is on the Category 2 Plan and states she is following her eating plan approximately 85% of the time. April [name] states she is walking 40 minutes 45 times per week.  Today's visit was #: 12 Starting weight: 256 lbs Starting date: 06/13/2019 Today's weight: 246 lbs Today's date: 12/27/2019 Total lbs lost to date: 10 Total lbs lost since last in-office visit: 1  Interim History: April [name] states that she is not weighing her protein. She is looking for additional snack options.  Subjective:   Vitamin D deficiency. April [name] is on prescription Vitamin D weekly. No nausea, vomiting, or muscle weakness. Last Vitamin D 45.9 on 10/24/2019.  Essential hypertension. April [name] is on HCTZ. Blood pressure is controlled today.  BP Readings from Last 3 Encounters:  12/27/19 105/73  12/12/19 116/82  12/06/19 128/70   Lab Results  Component Value Date   CREATININE 0.68 11/22/2019   CREATININE 0.60 10/24/2019   CREATININE 0.58 06/13/2019   Assessment/Plan:   Vitamin D deficiency. Low Vitamin D level contributes to fatigue and are associated with obesity, breast, and colon cancer. She agrees to continue to take prescription Vitamin D @50 ,000 IU every week #4 with 0 refills and will follow-up for routine testing of Vitamin D, at least 2-3 times per year to avoid over-replacement.  Essential hypertension. April [name] is working on healthy weight loss and exercise to improve blood pressure control. We will watch for signs of hypotension as she continues her lifestyle modifications. She will continue her medications as directed.  Class 3 severe obesity with serious comorbidity and body mass index (BMI) of 45.0 to 49.9 in adult, unspecified obesity type (April [name]).  April [name] is currently in the action stage of change. As such, her goal  is to continue with weight loss efforts. She has agreed to the Category 2 Plan and will journal 400-500 calories and 35 grams of protein at supper.   Exercise goals: For substantial health benefits, adults should do at least 150 minutes (2 hours and 30 minutes) a week of moderate-intensity, or 75 minutes (1 hour and 15 minutes) a week of vigorous-intensity aerobic physical activity, or an equivalent combination of moderate- and vigorous-intensity aerobic activity. Aerobic activity should be performed in episodes of at least 10 minutes, and preferably, it should be spread throughout the week.  Behavioral modification strategies: meal planning and cooking strategies and keeping healthy foods in the home.  April [name] has agreed to follow-up with our clinic in 2-3 weeks. She was informed of the importance of frequent follow-up visits to maximize her success with intensive lifestyle modifications for her multiple health conditions.   Objective:   Blood pressure 105/73, pulse 88, temperature 98.2 F (36.8 C), temperature source Oral, height 5' (1.524 m), weight 246 lb (111.6 kg), SpO2 96 %. Body mass index is 48.04 kg/m.  General: Cooperative, alert, well developed, in no acute distress. HEENT: Conjunctivae and lids unremarkable. Cardiovascular: Regular rhythm.  Lungs: Normal work of breathing. Neurologic: No focal deficits.   Lab Results  Component Value Date   CREATININE 0.68 11/22/2019   BUN 15 11/22/2019   NA 141 11/22/2019   K 3.9 11/22/2019   CL 101 11/22/2019   CO2 22 11/22/2019   Lab Results  Component Value Date   ALT 12 11/22/2019   AST 22 11/22/2019   ALKPHOS 161 (H) 11/22/2019  BILITOT 0.2 11/22/2019   Lab Results  Component Value Date   HGBA1C 5.4 06/13/2019   HGBA1C 5.2 11/05/2017   HGBA1C 5.4 03/02/2016   HGBA1C 5.3 02/22/2015   HGBA1C 5.4 12/22/2013   Lab Results  Component Value Date   INSULIN 8.6 10/24/2019   INSULIN 9.5 06/13/2019   Lab Results    Component Value Date   TSH 1.420 06/01/2018   Lab Results  Component Value Date   CHOL 235 (H) 10/24/2019   HDL 56 10/24/2019   LDLCALC 156 (H) 10/24/2019   LDLDIRECT 108 (H) 12/01/2012   TRIG 131 10/24/2019   CHOLHDL 3.4 11/05/2017   Lab Results  Component Value Date   WBC 6.4 10/24/2019   HGB 15.1 10/24/2019   HCT 45.4 10/24/2019   MCV 85 10/24/2019   PLT 354 10/24/2019   Lab Results  Component Value Date   IRON 36 (L) 12/11/2009   TIBC 318 12/11/2009   FERRITIN 19 12/11/2009   Attestation Statements:   Reviewed by clinician on day of visit: allergies, medications, problem list, medical history, surgical history, family history, social history, and previous encounter notes.  IMichaelene Song, am acting as transcriptionist for Abby Potash, PA-C  I have reviewed the above documentation for accuracy and completeness, and I agree with the above. Abby Potash, PA-C

## 2020-01-04 ENCOUNTER — Other Ambulatory Visit: Payer: Self-pay | Admitting: Family Medicine

## 2020-01-04 DIAGNOSIS — M170 Bilateral primary osteoarthritis of knee: Secondary | ICD-10-CM

## 2020-01-09 ENCOUNTER — Ambulatory Visit: Payer: Medicaid Other | Admitting: Family Medicine

## 2020-01-09 ENCOUNTER — Encounter: Payer: Self-pay | Admitting: Family Medicine

## 2020-01-09 ENCOUNTER — Other Ambulatory Visit: Payer: Self-pay

## 2020-01-09 VITALS — BP 128/68 | HR 74 | Ht 60.0 in | Wt 250.0 lb

## 2020-01-09 DIAGNOSIS — M79671 Pain in right foot: Secondary | ICD-10-CM | POA: Diagnosis not present

## 2020-01-09 NOTE — Assessment & Plan Note (Signed)
Patient with continued heel pain.  Will likely need further work-up with sports medicine.  I discussed the case with Dr. Sheppard Coil who also was able to see patient do physical exam.  He also agreed that patient would benefit from sports medicine center referral.  Patient may need green inserts versus custom orthotics.  Also recommended for ultrasound for viewing the calcaneus and the Achilles tendon.  Referral placed and phone number for sports medicine center given to patient so that she may call if they do not call her for appointment.  Strict return precautions given.  Advised to continue conservative measures as ice as well as Tylenol as needed.  Follow-up with sports medicine center.  Follow-up with [facility] as needed.

## 2020-01-09 NOTE — Progress Notes (Signed)
    SUBJECTIVE:   CHIEF COMPLAINT / HPI:   Heel pain Patient presenting for follow-up of heel pain.  Was seen on 2/8 at that time was thought to be heel spur.  Was advised to use conservative measures such as orthotics, heel pads, ice, Tylenol as needed.  At that time plan was if there was no improvement she may need referral to sports medicine for green inserts versus custom orthotics.  Patient reports she is still having pain in that area.  The pain is right on her heel.  Has been using heel pads daily without relief.  Is using Tylenol as needed.  Pain is worse after she has been sitting for a long time and then gets up to start walking.  Denies any edema.  Denies any trauma to the area.  Does not do excessive physical activity, walks in the park or close to the store.  PERTINENT  PMH / PSH: osteoarthritis of knees, vit D deficiency, heel pain, obesity   OBJECTIVE:   BP 128/68   Pulse 74   Ht 5' (1.524 m)   Wt 250 lb (113.4 kg)   SpO2 99%   BMI 48.82 kg/m   Foot: Inspection:  No obvious bony deformity.  No swelling, erythema, or bruising.  Flattened longitudinal arch Palpation: No tenderness to palpation, but does report pain is on back of calcaneus when she feels pain  ROM: Full  ROM of the ankle. Normal midfoot flexibility Strength: 5/5 strength ankle in all planes Neurovascular: N/V intact distally in the lower extremity Special tests: Negative anterior drawer. Negative squeeze. normal midfoot flexibility.     ASSESSMENT/PLAN:   Heel pain Patient with continued heel pain.  Will likely need further work-up with sports medicine.  I discussed the case with Dr. Sheppard Coil who also was able to see patient do physical exam.  He also agreed that patient would benefit from sports medicine center referral.  Patient may need green inserts versus custom orthotics.  Also recommended for ultrasound for viewing the calcaneus and the Achilles tendon.  Referral placed and phone number for  sports medicine center given to patient so that she may call if they do not call her for appointment.  Strict return precautions given.  Advised to continue conservative measures as ice as well as Tylenol as needed.  Follow-up with sports medicine center.  Follow-up with [facility] as needed.     Caroline More, [place]

## 2020-01-09 NOTE — Patient Instructions (Signed)
It was a pleasure seeing you today.   Today we discussed your heel pain  For your heel pain: continue to use tylenol as needed. Use ice as needed as well. You should get a call from the sports medicine center for follow up. There information is below  [facility] [address], [place], [place] [address] [phone]  Please follow up as needed with [facility] or sooner if symptoms persist or worsen. Please call the clinic immediately if you have any concerns.   Our clinic's number is [phone]. Please call with questions or concerns.    Thank you,  Caroline More, DO

## 2020-01-11 ENCOUNTER — Encounter (INDEPENDENT_AMBULATORY_CARE_PROVIDER_SITE_OTHER): Payer: Self-pay | Admitting: Physician Assistant

## 2020-01-11 ENCOUNTER — Ambulatory Visit (INDEPENDENT_AMBULATORY_CARE_PROVIDER_SITE_OTHER): Payer: Medicaid Other | Admitting: Physician Assistant

## 2020-01-11 ENCOUNTER — Other Ambulatory Visit: Payer: Self-pay

## 2020-01-11 VITALS — BP 114/72 | HR 63 | Temp 98.0°F | Ht 60.0 in | Wt 247.0 lb

## 2020-01-11 DIAGNOSIS — Z6841 Body Mass Index (BMI) 40.0 and over, adult: Secondary | ICD-10-CM | POA: Diagnosis not present

## 2020-01-11 DIAGNOSIS — E559 Vitamin D deficiency, unspecified: Secondary | ICD-10-CM

## 2020-01-11 DIAGNOSIS — I10 Essential (primary) hypertension: Secondary | ICD-10-CM | POA: Diagnosis not present

## 2020-01-11 MED ORDER — VITAMIN D (ERGOCALCIFEROL) 1.25 MG (50000 UNIT) PO CAPS: 50000.0000 [IU] | ORAL_CAPSULE | ORAL | 0 refills | Status: DC

## 2020-01-11 NOTE — Progress Notes (Signed)
Chief Complaint:   [place] is here to discuss her progress with her obesity treatment plan along with follow-up of her obesity related diagnoses. April [name] is on the Category 2 Plan and states she is following her eating plan approximately 85% of the time. April [name] states she is walking 30 minutes 3 times per week.  Today's visit was #: 15 Starting weight: 256 lbs Starting date: 06/13/2019 Today's weight: 247 lbs Today's date: 01/11/2020 Total lbs lost to date: 9 Total lbs lost since last in-office visit: 0  Interim History: April [name] states that she is not weighing her protein. She is also not counting her snack calories, and after reviewing her daily intake, overeating her snack calories daily.  Subjective:   Vitamin D deficiency. April [name] is on prescription Vitamin D weekly. No nausea, vomiting, or muscle weakness. Last Vitamin D level was not at goal - 45.9 on 10/24/2019.  Essential hypertension. April [name] is on HCTZ. No chest pain. No headache.  BP Readings from Last 3 Encounters:  01/11/20 114/72  01/09/20 128/68  12/27/19 105/73   Lab Results  Component Value Date   CREATININE 0.68 11/22/2019   CREATININE 0.60 10/24/2019   CREATININE 0.58 06/13/2019   Assessment/Plan:   Vitamin D deficiency. Low Vitamin D level contributes to fatigue and are associated with obesity, breast, and colon cancer. She was given a refill on her Vitamin D, Ergocalciferol, (DRISDOL) 1.25 MG (50000 UNIT) CAPS capsule every week #4 with 0 refills and will follow-up for routine testing of Vitamin D, at least 2-3 times per year to avoid over-replacement.      Essential hypertension. April [name] is working on healthy weight loss and exercise to improve blood pressure control. We will watch for signs of hypotension as she continues her lifestyle modifications. She will continue her medications as directed.  Class 3 severe obesity with serious comorbidity and body mass index (BMI) of  45.0 to 49.9 in adult, unspecified obesity type ([place]).  April [name] is currently in the action stage of change. As such, her goal is to continue with weight loss efforts. She has agreed to the Category 2 Plan.   Exercise goals: For substantial health benefits, adults should do at least 150 minutes (2 hours and 30 minutes) a week of moderate-intensity, or 75 minutes (1 hour and 15 minutes) a week of vigorous-intensity aerobic physical activity, or an equivalent combination of moderate- and vigorous-intensity aerobic activity. Aerobic activity should be performed in episodes of at least 10 minutes, and preferably, it should be spread throughout the week.  Behavioral modification strategies: meal planning and cooking strategies and keeping healthy foods in the home.  April [name] has agreed to follow-up with our clinic in 2 weeks. She was informed of the importance of frequent follow-up visits to maximize her success with intensive lifestyle modifications for her multiple health conditions.   Objective:   Blood pressure 114/72, pulse 63, temperature 98 F (36.7 C), temperature source Oral, height 5' (1.524 m), weight 247 lb (112 kg), SpO2 97 %. Body mass index is 48.24 kg/m.  General: Cooperative, alert, well developed, in no acute distress. HEENT: Conjunctivae and lids unremarkable. Cardiovascular: Regular rhythm.  Lungs: Normal work of breathing. Neurologic: No focal deficits.   Lab Results  Component Value Date   CREATININE 0.68 11/22/2019   BUN 15 11/22/2019   NA 141 11/22/2019   K 3.9 11/22/2019   CL 101 11/22/2019   CO2 22 11/22/2019   Lab Results  Component Value Date  ALT 12 11/22/2019   AST 22 11/22/2019   ALKPHOS 161 (H) 11/22/2019   BILITOT 0.2 11/22/2019   Lab Results  Component Value Date   HGBA1C 5.4 06/13/2019   HGBA1C 5.2 11/05/2017   HGBA1C 5.4 03/02/2016   HGBA1C 5.3 02/22/2015   HGBA1C 5.4 12/22/2013   Lab Results  Component Value Date   INSULIN 8.6  10/24/2019   INSULIN 9.5 06/13/2019   Lab Results  Component Value Date   TSH 1.420 06/01/2018   Lab Results  Component Value Date   CHOL 235 (H) 10/24/2019   HDL 56 10/24/2019   LDLCALC 156 (H) 10/24/2019   LDLDIRECT 108 (H) 12/01/2012   TRIG 131 10/24/2019   CHOLHDL 3.4 11/05/2017   Lab Results  Component Value Date   WBC 6.4 10/24/2019   HGB 15.1 10/24/2019   HCT 45.4 10/24/2019   MCV 85 10/24/2019   PLT 354 10/24/2019   Lab Results  Component Value Date   IRON 36 (L) 12/11/2009   TIBC 318 12/11/2009   FERRITIN 19 12/11/2009   Attestation Statements:   Reviewed by clinician on day of visit: allergies, medications, problem list, medical history, surgical history, family history, social history, and previous encounter notes.  IMichaelene Song, am acting as transcriptionist for Abby Potash, PA-C   I have reviewed the above documentation for accuracy and completeness, and I agree with the above. Abby Potash, PA-C

## 2020-01-15 ENCOUNTER — Encounter: Payer: Self-pay | Admitting: Family Medicine

## 2020-01-15 ENCOUNTER — Other Ambulatory Visit: Payer: Self-pay

## 2020-01-15 ENCOUNTER — Ambulatory Visit: Payer: Medicaid Other | Admitting: Family Medicine

## 2020-01-15 VITALS — BP 135/77 | Ht 60.0 in | Wt 246.0 lb

## 2020-01-15 DIAGNOSIS — M722 Plantar fascial fibromatosis: Secondary | ICD-10-CM | POA: Diagnosis not present

## 2020-01-15 NOTE — Progress Notes (Signed)
    SUBJECTIVE:   CHIEF COMPLAINT / HPI:   Right heel pain Patient reports a 5-month history of right heel pain on the plantar surface of her foot that is worsening.  Her pain is exacerbated when she first walks in the morning and when she walks after being seated for a while.  She also feels more pain when she dorsiflexes her right foot.  She denies pain in the Achilles tendon or in the back of the leg.  This has never occurred before.  She cannot identify any changes that occurred 3 months ago and her activity level that could have triggered her pain.  She walks daily and has done so for a long time.  She bought some insoles for her shoes but cannot tell if they have been helpful.  She also uses a foot massager at home which is somewhat helpful.  PERTINENT  PMH / PSH: Obesity, osteoarthritis of knees, back pain  OBJECTIVE:   BP 135/77   Ht 5' (1.524 m)   Wt 246 lb (111.6 kg)   BMI 48.04 kg/m   General: well appearing, appears stated age Right foot: No visual abnormality, mild tenderness to palpation of plantar fascia insertion site of the calcaneus, no tenderness to palpation of the Achilles tendon, normal ROM on dorsiflexion, plantarflexion, abduction, abduction, eversion, and inversion.  Anterior and posterior drawer test is normal.  Tenderness on dorsiflexion.  Neurovascularly intact.  Negative calcaneal squeeze.  ASSESSMENT/PLAN:   Plantar fasciitis History and exam are consistent with a diagnosis of plantar fasciitis of the right foot.  Patient was counseled on the importance of rehabilitation exercises including eccentric calf strengthening.  She was provided with a handout of these today.  She was also provided with insoles that will provide arch support to relieve symptoms and arch binder.  She was advised that she can also take anti-inflammatories and use a frozen water bottle to massage the plantar surface.  She was also encouraged to wear supportive shoes throughout the day.  She  was reassured that this will improve with time and with these measures.     Kathrene Alu, MD [place]

## 2020-01-15 NOTE — Assessment & Plan Note (Signed)
History and exam are consistent with a diagnosis of plantar fasciitis of the right foot.  Patient was counseled on the importance of rehabilitation exercises including eccentric calf strengthening.  She was provided with a handout of these today.  He was also provided with insoles that will provide arch support to relieve symptoms.  She was advised that she can also take anti-inflammatories and use a frozen water bottle to massage the plantar surface.  She was also encouraged to wear supportive shoes throughout the day.  Injection has not been shown to be effective for plantar fasciitis, so we will did not offer this.  She was reassured that this will improve with time and with these measures.

## 2020-01-15 NOTE — Patient Instructions (Signed)
You have plantar fasciitis Take tylenol, use topical medications like biofreeze as needed for pain. Plantar fascia stretch for 20-30 seconds (do 3 of these) in morning Lowering/raise on a step exercises 3 x 10 once or twice a day - this is very important for long term recovery. Can add heel walks, toe walks forward and backward as well Ice heel for 15 minutes as needed. Avoid flat shoes/barefoot walking as much as possible. Arch straps have been shown to help with pain - wear these when up and walking around. Wear green insoles that have scaphoid pads on them for additional arch support - this is also very important.  These can be switched to different shoes also. Steroid injection is a consideration for short term pain relief if you are struggling. Physical therapy is also an option. Follow up with me in 6 weeks.

## 2020-01-18 DIAGNOSIS — L80 Vitiligo: Secondary | ICD-10-CM | POA: Diagnosis not present

## 2020-01-25 ENCOUNTER — Other Ambulatory Visit: Payer: Self-pay | Admitting: Family Medicine

## 2020-01-25 DIAGNOSIS — M170 Bilateral primary osteoarthritis of knee: Secondary | ICD-10-CM

## 2020-01-29 ENCOUNTER — Encounter (INDEPENDENT_AMBULATORY_CARE_PROVIDER_SITE_OTHER): Payer: Self-pay | Admitting: Physician Assistant

## 2020-01-29 ENCOUNTER — Other Ambulatory Visit: Payer: Self-pay

## 2020-01-29 ENCOUNTER — Ambulatory Visit (INDEPENDENT_AMBULATORY_CARE_PROVIDER_SITE_OTHER): Payer: Medicaid Other | Admitting: Physician Assistant

## 2020-01-29 VITALS — BP 111/78 | HR 75 | Temp 97.9°F | Ht 60.0 in | Wt 244.0 lb

## 2020-01-29 DIAGNOSIS — Z6841 Body Mass Index (BMI) 40.0 and over, adult: Secondary | ICD-10-CM

## 2020-01-29 DIAGNOSIS — E559 Vitamin D deficiency, unspecified: Secondary | ICD-10-CM

## 2020-01-29 MED ORDER — VITAMIN D (ERGOCALCIFEROL) 1.25 MG (50000 UNIT) PO CAPS: 50000.0000 [IU] | ORAL_CAPSULE | ORAL | 0 refills | Status: DC

## 2020-01-29 NOTE — Progress Notes (Signed)
Chief Complaint:   April [name] is here to discuss her progress with her obesity treatment plan along with follow-up of her obesity related diagnoses. April [name] is on the Category 2 Plan and states she is following her eating plan approximately 80% of the time. April [name] states she is walking 30-40 minutes 4-5 times per week.  Today's visit was #: 14 Starting weight: 256 lbs Starting date: 06/13/2019 Today's weight: 244 lbs Today's date: 01/29/2020 Total lbs lost to date: 12 Total lbs lost since last in-office visit: 3  Interim History: April [name] did well with weight loss the last few weeks. She is also writing down what she is eating. She states she has been preparing her dinner more often and has been portion controlling her snacks.  Subjective:   Vitamin D deficiency. April [name] is on prescription Vitamin D. No nausea, vomiting, or muscle weakness. She is walking outside regularly. Last Vitamin D 45.9 on 10/24/2019.  Assessment/Plan:   Vitamin D deficiency. Low Vitamin D level contributes to fatigue and are associated with obesity, breast, and colon cancer. She was given a refill on her Vitamin D, Ergocalciferol, (DRISDOL) 1.25 MG (50000 UNIT) CAPS capsule every week #4 with 0 refills and will follow-up for routine testing of Vitamin D, at least 2-3 times per year to avoid over-replacement.    Class 3 severe obesity with serious comorbidity and body mass index (BMI) of 45.0 to 49.9 in adult, unspecified obesity type ([place]).  April [name] is currently in the action stage of change. As such, her goal is to continue with weight loss efforts. She has agreed to the Category 2 Plan.   Exercise goals: For substantial health benefits, adults should do at least 150 minutes (2 hours and 30 minutes) a week of moderate-intensity, or 75 minutes (1 hour and 15 minutes) a week of vigorous-intensity aerobic physical activity, or an equivalent combination of moderate- and vigorous-intensity  aerobic activity. Aerobic activity should be performed in episodes of at least 10 minutes, and preferably, it should be spread throughout the week.  Behavioral modification strategies: meal planning and cooking strategies and planning for success.  April [name] has agreed to follow-up with our clinic in 2 weeks. She was informed of the importance of frequent follow-up visits to maximize her success with intensive lifestyle modifications for her multiple health conditions.   Objective:   Blood pressure 111/78, pulse 75, temperature 97.9 F (36.6 C), temperature source Oral, height 5' (1.524 m), weight 244 lb (110.7 kg), SpO2 98 %. Body mass index is 47.65 kg/m.  General: Cooperative, alert, well developed, in no acute distress. HEENT: Conjunctivae and lids unremarkable. Cardiovascular: Regular rhythm.  Lungs: Normal work of breathing. Neurologic: No focal deficits.   Lab Results  Component Value Date   CREATININE 0.68 11/22/2019   BUN 15 11/22/2019   NA 141 11/22/2019   K 3.9 11/22/2019   CL 101 11/22/2019   CO2 22 11/22/2019   Lab Results  Component Value Date   ALT 12 11/22/2019   AST 22 11/22/2019   ALKPHOS 161 (H) 11/22/2019   BILITOT 0.2 11/22/2019   Lab Results  Component Value Date   HGBA1C 5.4 06/13/2019   HGBA1C 5.2 11/05/2017   HGBA1C 5.4 03/02/2016   HGBA1C 5.3 02/22/2015   HGBA1C 5.4 12/22/2013   Lab Results  Component Value Date   INSULIN 8.6 10/24/2019   INSULIN 9.5 06/13/2019   Lab Results  Component Value Date   TSH 1.420 06/01/2018   Lab Results  Component Value Date   CHOL 235 (H) 10/24/2019   HDL 56 10/24/2019   LDLCALC 156 (H) 10/24/2019   LDLDIRECT 108 (H) 12/01/2012   TRIG 131 10/24/2019   CHOLHDL 3.4 11/05/2017   Lab Results  Component Value Date   WBC 6.4 10/24/2019   HGB 15.1 10/24/2019   HCT 45.4 10/24/2019   MCV 85 10/24/2019   PLT 354 10/24/2019   Lab Results  Component Value Date   IRON 36 (L) 12/11/2009   TIBC 318  12/11/2009   FERRITIN 19 12/11/2009   Attestation Statements:   Reviewed by clinician on day of visit: allergies, medications, problem list, medical history, surgical history, family history, social history, and previous encounter notes.  IMichaelene Song, am acting as transcriptionist for Abby Potash, PA-C   I have reviewed the above documentation for accuracy and completeness, and I agree with the above. Abby Potash, PA-C

## 2020-02-13 ENCOUNTER — Ambulatory Visit (INDEPENDENT_AMBULATORY_CARE_PROVIDER_SITE_OTHER): Payer: Medicaid Other | Admitting: Physician Assistant

## 2020-02-13 ENCOUNTER — Encounter (INDEPENDENT_AMBULATORY_CARE_PROVIDER_SITE_OTHER): Payer: Self-pay | Admitting: Physician Assistant

## 2020-02-13 ENCOUNTER — Other Ambulatory Visit (INDEPENDENT_AMBULATORY_CARE_PROVIDER_SITE_OTHER): Payer: Self-pay | Admitting: Physician Assistant

## 2020-02-13 VITALS — BP 102/67 | HR 69 | Temp 97.8°F | Ht 60.0 in | Wt 244.0 lb

## 2020-02-13 DIAGNOSIS — E559 Vitamin D deficiency, unspecified: Secondary | ICD-10-CM

## 2020-02-13 DIAGNOSIS — Z6841 Body Mass Index (BMI) 40.0 and over, adult: Secondary | ICD-10-CM

## 2020-02-13 MED ORDER — VITAMIN D (ERGOCALCIFEROL) 1.25 MG (50000 UNIT) PO CAPS: 50000.0000 [IU] | ORAL_CAPSULE | ORAL | 0 refills | Status: DC

## 2020-02-13 NOTE — Progress Notes (Signed)
Chief Complaint:   April [name] is here to discuss her progress with her obesity treatment plan along with follow-up of her obesity related diagnoses. April [name] is on the Category 2 Plan and states she is following her eating plan approximately 80% of the time. April [name] states she is walking 30 minutes 3-4 times per week.  Today's visit was #: 15 Starting weight: 256 lbs Starting date: 06/13/2019 Today's weight: 244 lbs Today's date: 02/13/2020 Total lbs lost to date: 12 Total lbs lost since last in-office visit: 0  Interim History: April [name] notes that she has been snacking more. She is counting her calories but is not counting her protein. She is eating white rice and fried foods at times.  Subjective:   Vitamin D deficiency. Last Vitamin D level was not at goal - 45.9 on 10/24/2019. No nausea, vomiting, or muscle weakness on Vitamin D supplementation.  Assessment/Plan:   Vitamin D deficiency. Low Vitamin D level contributes to fatigue and are associated with obesity, breast, and colon cancer. She was given a refill on her Vitamin D, Ergocalciferol, (DRISDOL) 1.25 MG (50000 UNIT) CAPS capsule every week #4 with 0 refills and will follow-up for routine testing of Vitamin D, at least 2-3 times per year to avoid over-replacement.    Class 3 severe obesity with serious comorbidity and body mass index (BMI) of 45.0 to 49.9 in adult, unspecified obesity type ([place]).  April [name] is currently in the action stage of change. As such, her goal is to continue with weight loss efforts. She has agreed to keeping a food journal and adhering to recommended goals of 1200 calories and 85 grams of protein daily.   Exercise goals: For substantial health benefits, adults should do at least 150 minutes (2 hours and 30 minutes) a week of moderate-intensity, or 75 minutes (1 hour and 15 minutes) a week of vigorous-intensity aerobic physical activity, or an equivalent combination of moderate- and  vigorous-intensity aerobic activity. Aerobic activity should be performed in episodes of at least 10 minutes, and preferably, it should be spread throughout the week.  Behavioral modification strategies: planning for success and keeping a strict food journal.  April [name] has agreed to follow-up with our clinic in 2-3 weeks. She was informed of the importance of frequent follow-up visits to maximize her success with intensive lifestyle modifications for her multiple health conditions.   Objective:   Blood pressure 102/67, pulse 69, temperature 97.8 F (36.6 C), temperature source Oral, height 5' (1.524 m), weight 244 lb (110.7 kg), SpO2 97 %. Body mass index is 47.65 kg/m.  General: Cooperative, alert, well developed, in no acute distress. HEENT: Conjunctivae and lids unremarkable. Cardiovascular: Regular rhythm.  Lungs: Normal work of breathing. Neurologic: No focal deficits.   Lab Results  Component Value Date   CREATININE 0.68 11/22/2019   BUN 15 11/22/2019   NA 141 11/22/2019   K 3.9 11/22/2019   CL 101 11/22/2019   CO2 22 11/22/2019   Lab Results  Component Value Date   ALT 12 11/22/2019   AST 22 11/22/2019   ALKPHOS 161 (H) 11/22/2019   BILITOT 0.2 11/22/2019   Lab Results  Component Value Date   HGBA1C 5.4 06/13/2019   HGBA1C 5.2 11/05/2017   HGBA1C 5.4 03/02/2016   HGBA1C 5.3 02/22/2015   HGBA1C 5.4 12/22/2013   Lab Results  Component Value Date   INSULIN 8.6 10/24/2019   INSULIN 9.5 06/13/2019   Lab Results  Component Value Date   TSH  1.420 06/01/2018   Lab Results  Component Value Date   CHOL 235 (H) 10/24/2019   HDL 56 10/24/2019   LDLCALC 156 (H) 10/24/2019   LDLDIRECT 108 (H) 12/01/2012   TRIG 131 10/24/2019   CHOLHDL 3.4 11/05/2017   Lab Results  Component Value Date   WBC 6.4 10/24/2019   HGB 15.1 10/24/2019   HCT 45.4 10/24/2019   MCV 85 10/24/2019   PLT 354 10/24/2019   Lab Results  Component Value Date   IRON 36 (L) 12/11/2009    TIBC 318 12/11/2009   FERRITIN 19 12/11/2009   Attestation Statements:   Reviewed by clinician on day of visit: allergies, medications, problem list, medical history, surgical history, family history, social history, and previous encounter notes.  IMichaelene Song, am acting as transcriptionist for Abby Potash, PA-C   I have reviewed the above documentation for accuracy and completeness, and I agree with the above. Abby Potash, PA-C

## 2020-02-15 ENCOUNTER — Other Ambulatory Visit: Payer: Self-pay | Admitting: Family Medicine

## 2020-02-15 DIAGNOSIS — M170 Bilateral primary osteoarthritis of knee: Secondary | ICD-10-CM

## 2020-02-26 ENCOUNTER — Encounter: Payer: Self-pay | Admitting: Family Medicine

## 2020-02-26 ENCOUNTER — Other Ambulatory Visit: Payer: Self-pay

## 2020-02-26 ENCOUNTER — Ambulatory Visit: Payer: Medicaid Other | Admitting: Family Medicine

## 2020-02-26 VITALS — BP 126/86 | Ht 60.0 in | Wt 244.0 lb

## 2020-02-26 DIAGNOSIS — M722 Plantar fascial fibromatosis: Secondary | ICD-10-CM | POA: Diagnosis not present

## 2020-02-26 NOTE — Progress Notes (Signed)
    SUBJECTIVE:   CHIEF COMPLAINT / HPI:   Patient presents for follow-up for plantar fasciitis.  She has had improvement since her last visit.  Rates it as "30%" better.  She has been doing home massage, exercises, wearing arch binder, wearing sports insoles with supportive shoes.  She does wear flip-flops, when she does this tends to aggravate her plantar fasciitis.    OBJECTIVE:   BP 126/86   Ht 5' (1.524 m)   Wt 244 lb (110.7 kg)   BMI 47.65 kg/m   Right foot Inspection: No gross deformity Palpation: Tender to palpation along the medial anterior calcaneus at plantar fascia insertion.  No other tenderness. ROM: Normal range of motion Strength: Normal strength all motions of ankle. Stability: negative anterior drawer and talar tilt. Special tests: Negative calcaneal squeeze.  ASSESSMENT/PLAN:  Plantar fasciitis Improvement since last visit. Discussed with patient getting corticosteroid shot, through shared decision making decided to postpone given current clinical improvement. -Continue current modalities -Follow-up in 4 to 6 weeks  Bonnita Hollow, MD [place]

## 2020-02-26 NOTE — Patient Instructions (Signed)
You have plantar fasciitis Take tylenol, use topical medications like biofreeze as needed for pain. Plantar fascia stretch for 20-30 seconds (do 3 of these) in morning Lowering/raise on a step exercises 3 x 10 once or twice a day - this is very important for long term recovery. Can add heel walks, toe walks forward and backward as well Ice heel for 15 minutes as needed. Avoid flat shoes/barefoot walking as much as possible. Arch straps have been shown to help with pain - wear these when up and walking around. Wear green insoles that have scaphoid pads on them for additional arch support - this is also very important.  These can be switched to different shoes also. Steroid injection is a consideration for short term pain relief if you are struggling - we can do this today or you can call and we can do this in a few weeks if needed (no charge visit, only charge for the injection). Physical therapy is also an option. Follow up with me in 6 weeks otherwise.

## 2020-02-28 ENCOUNTER — Other Ambulatory Visit: Payer: Self-pay

## 2020-02-28 ENCOUNTER — Encounter (INDEPENDENT_AMBULATORY_CARE_PROVIDER_SITE_OTHER): Payer: Self-pay | Admitting: Physician Assistant

## 2020-02-28 ENCOUNTER — Ambulatory Visit (INDEPENDENT_AMBULATORY_CARE_PROVIDER_SITE_OTHER): Payer: Medicaid Other | Admitting: Physician Assistant

## 2020-02-28 VITALS — BP 115/75 | HR 62 | Temp 97.8°F | Ht 60.0 in | Wt 241.0 lb

## 2020-02-28 DIAGNOSIS — Z6841 Body Mass Index (BMI) 40.0 and over, adult: Secondary | ICD-10-CM

## 2020-02-28 DIAGNOSIS — E559 Vitamin D deficiency, unspecified: Secondary | ICD-10-CM

## 2020-02-28 MED ORDER — VITAMIN D (ERGOCALCIFEROL) 1.25 MG (50000 UNIT) PO CAPS: 50000.0000 [IU] | ORAL_CAPSULE | ORAL | 0 refills | Status: DC

## 2020-02-28 NOTE — Progress Notes (Signed)
Chief Complaint:   OBESITY April [name] is here to discuss her progress with her obesity treatment plan along with follow-up of her obesity related diagnoses. April [name] is on keeping a food journal and adhering to recommended goals of 1200 calories and 85 grams of protein and states she is following her eating plan approximately 85% of the time. April [name] states she is walking for 30-40 minutes.  Today's visit was #: 86 Starting weight: 256 lbs Starting date: 06/13/2019 Today's weight: 241 lbs Today's date: 02/28/2020 Total lbs lost to date: 15 lbs Total lbs lost since last in-office visit: 3 lbs  Interim History: April [name] is doing a good job staying around her 1200 calorie goal.  She is weighing her protein with her meals but is not tracking it.  Subjective:   1. Vitamin D deficiency April [name]'s Vitamin D level was 45.9 on 10/24/2019. She is currently taking prescription vitamin D 50,000 IU each week. She denies nausea, vomiting or muscle weakness.  Assessment/Plan:   1. Vitamin D deficiency Low Vitamin D level contributes to fatigue and are associated with obesity, breast, and colon cancer. She agrees to continue to take prescription Vitamin D @50 ,000 IU every week and will follow-up for routine testing of Vitamin D, at least 2-3 times per year to avoid over-replacement. - Vitamin D, Ergocalciferol, (DRISDOL) 1.25 MG (50000 UNIT) CAPS capsule; Take 1 capsule (50,000 Units total) by mouth every 7 (seven) days.  Dispense: 4 capsule; Refill: 0  2. Class 3 severe obesity with serious comorbidity and body mass index (BMI) of 45.0 to 49.9 in adult, unspecified obesity type (April [name]) April [name] is currently in the action stage of change. As such, her goal is to continue with weight loss efforts. She has agreed to keeping a food journal and adhering to recommended goals of 1200 calories and 80 grams of protein daily.   Exercise goals: For substantial health benefits, adults should do at least 150  minutes (2 hours and 30 minutes) a week of moderate-intensity, or 75 minutes (1 hour and 15 minutes) a week of vigorous-intensity aerobic physical activity, or an equivalent combination of moderate- and vigorous-intensity aerobic activity. Aerobic activity should be performed in episodes of at least 10 minutes, and preferably, it should be spread throughout the week.  Behavioral modification strategies: meal planning and cooking strategies.  April [name] has agreed to follow-up with our clinic in 2-3 weeks, fasting. She was informed of the importance of frequent follow-up visits to maximize her success with intensive lifestyle modifications for her multiple health conditions.   Objective:   Blood pressure 115/75, pulse 62, temperature 97.8 F (36.6 C), temperature source Oral, height 5' (1.524 m), weight 241 lb (109.3 kg), SpO2 97 %. Body mass index is 47.07 kg/m.  General: Cooperative, alert, well developed, in no acute distress. HEENT: Conjunctivae and lids unremarkable. Cardiovascular: Regular rhythm.  Lungs: Normal work of breathing. Neurologic: No focal deficits.   Lab Results  Component Value Date   CREATININE 0.68 11/22/2019   BUN 15 11/22/2019   NA 141 11/22/2019   K 3.9 11/22/2019   CL 101 11/22/2019   CO2 22 11/22/2019   Lab Results  Component Value Date   ALT 12 11/22/2019   AST 22 11/22/2019   ALKPHOS 161 (H) 11/22/2019   BILITOT 0.2 11/22/2019   Lab Results  Component Value Date   HGBA1C 5.4 06/13/2019   HGBA1C 5.2 11/05/2017   HGBA1C 5.4 03/02/2016   HGBA1C 5.3 02/22/2015   HGBA1C 5.4 12/22/2013  Lab Results  Component Value Date   INSULIN 8.6 10/24/2019   INSULIN 9.5 06/13/2019   Lab Results  Component Value Date   TSH 1.420 06/01/2018   Lab Results  Component Value Date   CHOL 235 (H) 10/24/2019   HDL 56 10/24/2019   LDLCALC 156 (H) 10/24/2019   LDLDIRECT 108 (H) 12/01/2012   TRIG 131 10/24/2019   CHOLHDL 3.4 11/05/2017   Lab Results    Component Value Date   WBC 6.4 10/24/2019   HGB 15.1 10/24/2019   HCT 45.4 10/24/2019   MCV 85 10/24/2019   PLT 354 10/24/2019   Lab Results  Component Value Date   IRON 36 (L) 12/11/2009   TIBC 318 12/11/2009   FERRITIN 19 12/11/2009   Attestation Statements:   Reviewed by clinician on day of visit: allergies, medications, problem list, medical history, surgical history, family history, social history, and previous encounter notes.  I, Water quality scientist, CMA, am acting as Location manager for [employer], PA-C.  I have reviewed the above documentation for accuracy and completeness, and I agree with the above. Abby Potash, PA-C

## 2020-03-07 ENCOUNTER — Other Ambulatory Visit: Payer: Self-pay | Admitting: *Deleted

## 2020-03-07 DIAGNOSIS — M170 Bilateral primary osteoarthritis of knee: Secondary | ICD-10-CM

## 2020-03-07 MED ORDER — TRAMADOL HCL 50 MG PO TABS: ORAL_TABLET | ORAL | 0 refills | Status: DC

## 2020-03-15 ENCOUNTER — Other Ambulatory Visit: Payer: Self-pay | Admitting: Family Medicine

## 2020-03-20 ENCOUNTER — Other Ambulatory Visit: Payer: Self-pay

## 2020-03-20 ENCOUNTER — Encounter (INDEPENDENT_AMBULATORY_CARE_PROVIDER_SITE_OTHER): Payer: Self-pay | Admitting: Physician Assistant

## 2020-03-20 ENCOUNTER — Ambulatory Visit (INDEPENDENT_AMBULATORY_CARE_PROVIDER_SITE_OTHER): Payer: Medicaid Other | Admitting: Physician Assistant

## 2020-03-20 VITALS — BP 118/74 | HR 61 | Temp 98.3°F | Ht 60.0 in | Wt 246.0 lb

## 2020-03-20 DIAGNOSIS — Z6841 Body Mass Index (BMI) 40.0 and over, adult: Secondary | ICD-10-CM

## 2020-03-20 DIAGNOSIS — R739 Hyperglycemia, unspecified: Secondary | ICD-10-CM

## 2020-03-20 DIAGNOSIS — E7849 Other hyperlipidemia: Secondary | ICD-10-CM | POA: Diagnosis not present

## 2020-03-20 DIAGNOSIS — E559 Vitamin D deficiency, unspecified: Secondary | ICD-10-CM

## 2020-03-20 MED ORDER — VITAMIN D (ERGOCALCIFEROL) 1.25 MG (50000 UNIT) PO CAPS: 50000.0000 [IU] | ORAL_CAPSULE | ORAL | 0 refills | Status: DC

## 2020-03-20 NOTE — Progress Notes (Signed)
Chief Complaint:   April [name] is here to discuss her progress with her obesity treatment plan along with follow-up of her obesity related diagnoses. April [name] is on the Category 2 Plan and states she is following her eating plan approximately 85% of the time. April [name] states she is exercising 0 minutes 0 times per week.  Today's visit was #: 16 Starting weight: 256 lbs Starting date: 06/13/2019 Today's weight: 246 lbs Today's date: 03/20/2020 Total lbs lost to date: 10 Total lbs lost since last in-office visit: 0  Interim History: April [name] states that she overindulged at her daughter's graduation party. She has not been journaling consistently.  Subjective:   Hyperglycemia. April [name] has a history of some elevated blood glucose readings without a diagnosis of diabetes. She denies polyphagia. She is on no medication.  Vitamin D deficiency. April [name] is on prescription Vitamin D supplement weekly. No nausea, vomiting, or muscle weakness. Last Vitamin D was 45.9 on 10/24/2019.  Other hyperlipidemia. April [name] is on no medication. No chest pain.   Lab Results  Component Value Date   CHOL 235 (H) 10/24/2019   HDL 56 10/24/2019   LDLCALC 156 (H) 10/24/2019   LDLDIRECT 108 (H) 12/01/2012   TRIG 131 10/24/2019   CHOLHDL 3.4 11/05/2017   Lab Results  Component Value Date   ALT 12 11/22/2019   AST 22 11/22/2019   ALKPHOS 161 (H) 11/22/2019   BILITOT 0.2 11/22/2019   The 10-year ASCVD risk score Mikey Bussing DC Jr., et al., 2013) is: 5%   Values used to calculate the score:     Age: 59 years     Sex: Female     Is Non-Hispanic African American: Yes     Diabetic: No     Tobacco smoker: No     Systolic Blood Pressure: 833 mmHg     Is BP treated: Yes     HDL Cholesterol: 56 mg/dL     Total Cholesterol: 235 mg/dL  Assessment/Plan:   Hyperglycemia. Fasting labs will be obtained and results with be discussed with Vanity in 2 weeks at her follow up visit. In the  meanwhile Anwen was started on a lower simple carbohydrate diet and will work on weight loss efforts. Comprehensive metabolic panel, Hemoglobin A1c, Insulin, random, Lipid Panel With LDL/HDL Ratio ordered today.  Vitamin D deficiency. Low Vitamin D level contributes to fatigue and are associated with obesity, breast, and colon cancer. She was given a refill on her Vitamin D, Ergocalciferol, (DRISDOL) 1.25 MG (50000 UNIT) CAPS capsule every week #4 with 0 refills and VITAMIN D 25 Hydroxy (Vit-D Deficiency, Fractures) level ordered today.  Other hyperlipidemia. Cardiovascular risk and specific lipid/LDL goals reviewed.  We discussed several lifestyle modifications today and [name] will continue to work on diet, exercise and weight loss efforts. Orders and follow up as documented in patient record. Lipid Panel With LDL/HDL Ratio ordered today.  Counseling Intensive lifestyle modifications are the first line treatment for this issue. . Dietary changes: Increase soluble fiber. Decrease simple carbohydrates. . Exercise changes: Moderate to vigorous-intensity aerobic activity 150 minutes per week if tolerated. . Lipid-lowering medications: see documented in medical record.   Class 3 severe obesity with serious comorbidity and body mass index (BMI) of 45.0 to 49.9 in adult, unspecified obesity type ([place]).  April [name] is currently in the action stage of change. As such, her goal is to continue with weight loss efforts. She has agreed to keeping a food journal and adhering to recommended goals  of 1200 calories and 85 grams of protein daily.   Exercise goals: For substantial health benefits, adults should do at least 150 minutes (2 hours and 30 minutes) a week of moderate-intensity, or 75 minutes (1 hour and 15 minutes) a week of vigorous-intensity aerobic physical activity, or an equivalent combination of moderate- and vigorous-intensity aerobic activity. Aerobic activity should be performed in episodes of  at least 10 minutes, and preferably, it should be spread throughout the week.  Behavioral modification strategies: planning for success and keeping a strict food journal.  April [name] has agreed to follow-up with our clinic in 2 weeks. She was informed of the importance of frequent follow-up visits to maximize her success with intensive lifestyle modifications for her multiple health conditions.   April [name] was informed we would discuss her lab results at her next visit unless there is a critical issue that needs to be addressed sooner. April [name] agreed to keep her next visit at the agreed upon time to discuss these results.  Objective:   Blood pressure 118/74, pulse 61, temperature 98.3 F (36.8 C), temperature source Oral, height 5' (1.524 m), weight 246 lb (111.6 kg), SpO2 100 %. Body mass index is 48.04 kg/m.  General: Cooperative, alert, well developed, in no acute distress. HEENT: Conjunctivae and lids unremarkable. Cardiovascular: Regular rhythm.  Lungs: Normal work of breathing. Neurologic: No focal deficits.   Lab Results  Component Value Date   CREATININE 0.68 11/22/2019   BUN 15 11/22/2019   NA 141 11/22/2019   K 3.9 11/22/2019   CL 101 11/22/2019   CO2 22 11/22/2019   Lab Results  Component Value Date   ALT 12 11/22/2019   AST 22 11/22/2019   ALKPHOS 161 (H) 11/22/2019   BILITOT 0.2 11/22/2019   Lab Results  Component Value Date   HGBA1C 5.4 06/13/2019   HGBA1C 5.2 11/05/2017   HGBA1C 5.4 03/02/2016   HGBA1C 5.3 02/22/2015   HGBA1C 5.4 12/22/2013   Lab Results  Component Value Date   INSULIN 8.6 10/24/2019   INSULIN 9.5 06/13/2019   Lab Results  Component Value Date   TSH 1.420 06/01/2018   Lab Results  Component Value Date   CHOL 235 (H) 10/24/2019   HDL 56 10/24/2019   LDLCALC 156 (H) 10/24/2019   LDLDIRECT 108 (H) 12/01/2012   TRIG 131 10/24/2019   CHOLHDL 3.4 11/05/2017   Lab Results  Component Value Date   WBC 6.4 10/24/2019   HGB 15.1  10/24/2019   HCT 45.4 10/24/2019   MCV 85 10/24/2019   PLT 354 10/24/2019   Lab Results  Component Value Date   IRON 36 (L) 12/11/2009   TIBC 318 12/11/2009   FERRITIN 19 12/11/2009   Attestation Statements:   Reviewed by clinician on day of visit: allergies, medications, problem list, medical history, surgical history, family history, social history, and previous encounter notes.  IMichaelene Song, am acting as transcriptionist for Abby Potash, PA-C   I have reviewed the above documentation for accuracy and completeness, and I agree with the above. Abby Potash, PA-C

## 2020-03-21 DIAGNOSIS — L80 Vitiligo: Secondary | ICD-10-CM | POA: Diagnosis not present

## 2020-03-21 LAB — COMPREHENSIVE METABOLIC PANEL
ALT: 11 IU/L (ref 0–32)
AST: 16 IU/L (ref 0–40)
Albumin/Globulin Ratio: 1.2 (ref 1.2–2.2)
Albumin: 4.4 g/dL (ref 3.8–4.9)
Alkaline Phosphatase: 151 IU/L — ABNORMAL HIGH (ref 48–121)
BUN/Creatinine Ratio: 25 — ABNORMAL HIGH (ref 9–23)
BUN: 19 mg/dL (ref 6–24)
Bilirubin Total: 0.3 mg/dL (ref 0.0–1.2)
CO2: 24 mmol/L (ref 20–29)
Calcium: 9.8 mg/dL (ref 8.7–10.2)
Chloride: 99 mmol/L (ref 96–106)
Creatinine, Ser: 0.75 mg/dL (ref 0.57–1.00)
GFR calc Af Amer: 102 mL/min/{1.73_m2} (ref >59)
GFR calc non Af Amer: 89 mL/min/{1.73_m2} (ref >59)
Globulin, Total: 3.6 g/dL (ref 1.5–4.5)
Glucose: 76 mg/dL (ref 65–99)
Potassium: 4.2 mmol/L (ref 3.5–5.2)
Sodium: 138 mmol/L (ref 134–144)
Total Protein: 8 g/dL (ref 6.0–8.5)

## 2020-03-21 LAB — LIPID PANEL WITH LDL/HDL RATIO
Cholesterol, Total: 233 mg/dL — ABNORMAL HIGH (ref 100–199)
HDL: 52 mg/dL (ref >39)
LDL Chol Calc (NIH): 162 mg/dL — ABNORMAL HIGH (ref 0–99)
LDL/HDL Ratio: 3.1 ratio (ref 0.0–3.2)
Triglycerides: 107 mg/dL (ref 0–149)
VLDL Cholesterol Cal: 19 mg/dL (ref 5–40)

## 2020-03-21 LAB — HEMOGLOBIN A1C
Est. average glucose Bld gHb Est-mCnc: 111 mg/dL
Hgb A1c MFr Bld: 5.5 % (ref 4.8–5.6)

## 2020-03-21 LAB — VITAMIN D 25 HYDROXY (VIT D DEFICIENCY, FRACTURES): Vit D, 25-Hydroxy: 56.9 ng/mL (ref 30.0–100.0)

## 2020-03-21 LAB — INSULIN, RANDOM: INSULIN: 7.5 u[IU]/mL (ref 2.6–24.9)

## 2020-03-22 ENCOUNTER — Other Ambulatory Visit: Payer: Self-pay

## 2020-03-22 ENCOUNTER — Ambulatory Visit: Payer: Medicaid Other | Admitting: Pediatrics

## 2020-03-22 VITALS — BP 102/79 | Ht 60.0 in | Wt 246.0 lb

## 2020-03-22 DIAGNOSIS — M722 Plantar fascial fibromatosis: Secondary | ICD-10-CM | POA: Diagnosis not present

## 2020-03-22 MED ORDER — METHYLPREDNISOLONE ACETATE 40 MG/ML IJ SUSP
40.0000 mg | Freq: Once | INTRAMUSCULAR | Status: AC
  Administered 2020-03-22: 40 mg via INTRA_ARTICULAR

## 2020-03-22 NOTE — Progress Notes (Addendum)
  April [name] - 59 y.o. female MRN [id number]  Date of birth: 20-Mar-1961  SUBJECTIVE:   CC:  Right heel pain Patient presenting for follow-up of right heel pain.  States that she has been doing the exercises prescribed by Dr. Barbaraann Barthel and there seemed to be some improvement.  Yesterday was very painful and so she had to take Tylenol for pain.  States that pain comes and goes.  Denies any edema.  Denies any bruising.  States that she scheduled for yesterday for injection due to increased pain.     DATA REVIEWED: Prior notes  PHYSICAL EXAM:  VS: BP:102/79  HR: bpm  TEMP: ( )  RESP:   HT:5' (152.4 cm)   WT:246 lb (111.6 kg)  BMI:48.04 PHYSICAL EXAM: Gen: NAD, alert, cooperative with exam, well-appearing HEENT: clear conjunctiva,  CV:  no edema, capillary refill brisk, normal rate Resp: non-labored Skin: no rashes, normal turgor  Neuro: no gross deficits.  Psych:  alert and oriented   ASSESSMENT & PLAN:   59 year old with chronic plantar fasciitis, presenting for injection of plantar fascia.  Procedure performed: plantar fascia, US guided  Consent obtained and verified. Time-out conducted. Noted no overlying erythema, induration, or other signs of local infection. The right plantar fascia was identified with ultrasound and was thickened to 1.2 cm. Out of plane approach was used and area medial to the plantar fascia was marked. The overlying skin was prepped in a sterile fashion.  Topical analgesic spray: Ethyl chloride. Needle: 25-gauge, 1.5 inch Using an out of plane approach the injectate was seen going into the plantar fascia.  Completed without difficulty. The patient had good pain relief during anesthetic phase. Meds: 40 mg depomedrol, 1 cc xylocaine  I was the preceptor for this visit and available for immediate consultation Shellia Cleverly, DO

## 2020-03-29 ENCOUNTER — Other Ambulatory Visit: Payer: Self-pay | Admitting: Family Medicine

## 2020-03-29 DIAGNOSIS — M170 Bilateral primary osteoarthritis of knee: Secondary | ICD-10-CM

## 2020-04-08 ENCOUNTER — Ambulatory Visit: Payer: Medicaid Other | Admitting: Family Medicine

## 2020-04-09 ENCOUNTER — Encounter (INDEPENDENT_AMBULATORY_CARE_PROVIDER_SITE_OTHER): Payer: Self-pay | Admitting: Family Medicine

## 2020-04-09 ENCOUNTER — Ambulatory Visit (INDEPENDENT_AMBULATORY_CARE_PROVIDER_SITE_OTHER): Payer: Medicaid Other | Admitting: Family Medicine

## 2020-04-09 ENCOUNTER — Other Ambulatory Visit: Payer: Self-pay

## 2020-04-09 VITALS — BP 133/74 | HR 56 | Temp 97.9°F | Ht 60.0 in | Wt 240.0 lb

## 2020-04-09 DIAGNOSIS — I10 Essential (primary) hypertension: Secondary | ICD-10-CM

## 2020-04-09 DIAGNOSIS — Z6841 Body Mass Index (BMI) 40.0 and over, adult: Secondary | ICD-10-CM

## 2020-04-09 DIAGNOSIS — E7800 Pure hypercholesterolemia, unspecified: Secondary | ICD-10-CM | POA: Diagnosis not present

## 2020-04-09 DIAGNOSIS — E559 Vitamin D deficiency, unspecified: Secondary | ICD-10-CM | POA: Diagnosis not present

## 2020-04-09 DIAGNOSIS — R748 Abnormal levels of other serum enzymes: Secondary | ICD-10-CM

## 2020-04-09 MED ORDER — VITAMIN D (ERGOCALCIFEROL) 1.25 MG (50000 UNIT) PO CAPS: 50000.0000 [IU] | ORAL_CAPSULE | ORAL | 0 refills | Status: DC

## 2020-04-09 NOTE — Progress Notes (Signed)
Chief Complaint:   OBESITY April [name] is here to discuss her progress with her obesity treatment plan along with follow-up of her obesity related diagnoses. April [name] is on the Category 2 Plan and states she is following her eating plan approximately 85% of the time. April [name] states she is exercising for 0 minutes 0 times per week.  Today's visit was #: 18 Starting weight: 256 lbs Starting date: 06/13/2019 Today's weight: 240 lbs Today's date: 04/09/2020 Total lbs lost to date: 16 lbs Total lbs lost since last in-office visit: 6 lbs  Interim History: April [name] is down 16 pounds today.  "Water doesn't taste good."  She is getting in 3-4 bottles per day.  Subjective:   1. Hypercholesterolemia April [name] has hyperlipidemia and has been trying to improve her cholesterol levels with intensive lifestyle modification including a low saturated fat diet, exercise and weight loss. She denies any chest pain, claudication or myalgias.  ASCVD 7.6.    Lab Results  Component Value Date   ALT 11 03/20/2020   AST 16 03/20/2020   ALKPHOS 151 (H) 03/20/2020   BILITOT 0.3 03/20/2020   Lab Results  Component Value Date   CHOL 233 (H) 03/20/2020   HDL 52 03/20/2020   LDLCALC 162 (H) 03/20/2020   LDLDIRECT 108 (H) 12/01/2012   TRIG 107 03/20/2020   CHOLHDL 3.4 11/05/2017   The 10-year ASCVD risk score Mikey Bussing DC Jr., et al., 2013) is: 7.6%   Values used to calculate the score:     Age: 59 years     Sex: Female     Is Non-Hispanic African American: Yes     Diabetic: No     Tobacco smoker: No     Systolic Blood Pressure: 875 mmHg     Is BP treated: Yes     HDL Cholesterol: 52 mg/dL     Total Cholesterol: 233 mg/dL  2. Vitamin D deficiency April [name]'s Vitamin D level was 45.9 on 10/24/2019. She is currently taking prescription vitamin D 50,000 IU each week. She denies nausea, vomiting or muscle weakness.  Vitamin D is at goal.  3. Essential hypertension Review: taking medications as instructed,  no medication side effects noted, no chest pain on exertion, no dyspnea on exertion, no swelling of ankles.  Blood pressure is elevated slightly today.  BP Readings from Last 3 Encounters:  04/09/20 133/74  03/22/20 102/79  03/20/20 118/74   4. High alkaline phosphatase  Lab Results  Component Value Date   ALT 11 03/20/2020   AST 16 03/20/2020   ALKPHOS 151 (H) 03/20/2020   BILITOT 0.3 03/20/2020   Assessment/Plan:   1. Hypercholesterolemia Cardiovascular risk and specific lipid/LDL goals reviewed.  We discussed several lifestyle modifications today and April [name] will continue to work on diet, exercise and weight loss efforts. Orders and follow up as documented in patient record.  Discussed possible statin in the future.  She will increase fiber and Omega 3 foods.  Counseling Intensive lifestyle modifications are the first line treatment for this issue. . Dietary changes: Increase soluble fiber. Decrease simple carbohydrates. . Exercise changes: Moderate to vigorous-intensity aerobic activity 150 minutes per week if tolerated. . Lipid-lowering medications: see documented in medical record.  2. Vitamin D deficiency Low Vitamin D level contributes to fatigue and are associated with obesity, breast, and colon cancer. She agrees to continue to take prescription Vitamin D @50 ,000 IU every other week and will follow-up for routine testing of Vitamin D, at least 2-3 times per year to  avoid over-replacement.  Orders - Vitamin D, Ergocalciferol, (DRISDOL) 1.25 MG (50000 UNIT) CAPS capsule; Take 1 capsule (50,000 Units total) by mouth every 14 (fourteen) days.  Dispense: 2 capsule; Refill: 0  3. Essential hypertension April [name] is working on healthy weight loss and exercise to improve blood pressure control. We will watch for signs of hypotension as she continues her lifestyle modifications.  4. High alkaline phosphatase Will order US abdomen.  Orders - US Abdomen Limited RUQ;  Future  5. Class 3 severe obesity with serious comorbidity and body mass index (BMI) of 45.0 to 49.9 in adult, unspecified obesity type ([place]) April [name] is currently in the action stage of change. As such, her goal is to continue with weight loss efforts. She has agreed to the Category 2 Plan.   Exercise goals: No exercise has been prescribed at this time.  Behavioral modification strategies: increasing lean protein intake.  Increase water intake by 1 bottle per day.  April [name] has agreed to follow-up with our clinic in 3 weeks. She was informed of the importance of frequent follow-up visits to maximize her success with intensive lifestyle modifications for her multiple health conditions.   Objective:   Blood pressure 133/74, pulse (!) 56, temperature 97.9 F (36.6 C), temperature source Oral, height 5' (1.524 m), weight 240 lb (108.9 kg), SpO2 100 %. Body mass index is 46.87 kg/m.  General: Cooperative, alert, well developed, in no acute distress. HEENT: Conjunctivae and lids unremarkable. Cardiovascular: Regular rhythm.  Lungs: Normal work of breathing. Neurologic: No focal deficits.   Lab Results  Component Value Date   CREATININE 0.75 03/20/2020   BUN 19 03/20/2020   NA 138 03/20/2020   K 4.2 03/20/2020   CL 99 03/20/2020   CO2 24 03/20/2020   Lab Results  Component Value Date   ALT 11 03/20/2020   AST 16 03/20/2020   ALKPHOS 151 (H) 03/20/2020   BILITOT 0.3 03/20/2020   Lab Results  Component Value Date   HGBA1C 5.5 03/20/2020   HGBA1C 5.4 06/13/2019   HGBA1C 5.2 11/05/2017   HGBA1C 5.4 03/02/2016   HGBA1C 5.3 02/22/2015   Lab Results  Component Value Date   INSULIN 7.5 03/20/2020   INSULIN 8.6 10/24/2019   INSULIN 9.5 06/13/2019   Lab Results  Component Value Date   TSH 1.420 06/01/2018   Lab Results  Component Value Date   CHOL 233 (H) 03/20/2020   HDL 52 03/20/2020   LDLCALC 162 (H) 03/20/2020   LDLDIRECT 108 (H) 12/01/2012   TRIG 107 03/20/2020    CHOLHDL 3.4 11/05/2017   Lab Results  Component Value Date   WBC 6.4 10/24/2019   HGB 15.1 10/24/2019   HCT 45.4 10/24/2019   MCV 85 10/24/2019   PLT 354 10/24/2019   Lab Results  Component Value Date   IRON 36 (L) 12/11/2009   TIBC 318 12/11/2009   FERRITIN 19 12/11/2009   Attestation Statements:   Reviewed by clinician on day of visit: allergies, medications, problem list, medical history, surgical history, family history, social history, and previous encounter notes.  I, Water quality scientist, CMA, am acting as transcriptionist for Briscoe Deutscher, DO  I have reviewed the above documentation for accuracy and completeness, and I agree with the above. Briscoe Deutscher, DO

## 2020-04-18 ENCOUNTER — Other Ambulatory Visit: Payer: Self-pay

## 2020-04-18 ENCOUNTER — Encounter: Payer: Self-pay | Admitting: Family Medicine

## 2020-04-18 ENCOUNTER — Ambulatory Visit (INDEPENDENT_AMBULATORY_CARE_PROVIDER_SITE_OTHER): Payer: Medicaid Other | Admitting: Family Medicine

## 2020-04-18 VITALS — BP 110/70 | HR 66 | Ht 60.0 in | Wt 246.2 lb

## 2020-04-18 DIAGNOSIS — M25562 Pain in left knee: Secondary | ICD-10-CM

## 2020-04-18 MED ORDER — METHYLPREDNISOLONE ACETATE 40 MG/ML IJ SUSP
40.0000 mg | Freq: Once | INTRAMUSCULAR | Status: AC
  Administered 2020-04-18: 40 mg via INTRAMUSCULAR

## 2020-04-18 NOTE — Progress Notes (Signed)
Patient name: April [name] MRN [id number] DOB: [birth year] PCP: Donney Dice, DO  CC: Knee Pain (injection)  HPI:  April [name] is a 59 y.o.y.o. female with PMHx significant for osteoarthritis of b/l knees, s/p R total knee , who presents to clinic for knee pain, requesting steroid injections. Last injection was February 24th.   Meds  Allergies  PMHx: Reviewed as appropriate  Physical Exam:  BP 140/80   Pulse 78   LMP 04/20/2015   SpO2 99%  General: NAD, non-toxic, well-appearing, sitting comfortably in chair   Integumentary: No obvious rashes, lesions, trauma on general exam. Cardiovascular: No BLEE Extremities: Warm and well perfused. No BLEE  Knee: no warmth, crepitus. TTP of joint line. Full ROM.    Procedure:  Large Joint Injection - Knee   PRE-OP DIAGNOSIS: Bilateral knee pain secondary to osteoarthritis POST-OP DIAGNOSIS: Same  PROCEDURE: Large joint injection LOCATION: Bilateral Knees Performing Physician: Dr. Maudie Mercury Supervising Physician (if applicable):    Procedure, treatment alternatives, risks and benefits were explained to patient and specific risks were discussed.  Consent was given by the patient.  Immediately prior to procedure, a timeout was called to verify correct patient, procedure, equipment, support staff and correct site and side which had been marked. The area was prepped in usual sterile manner.  Using lateral approach with patient's knee extended to 160 degrees, injection site was sterilized with alcohol, and treated with cold spray for ten seconds prior to injection of 1:4 ratio of lidocaine 1% to 40mg  methylPREDNISolone acetate (DEPO-MEDROL). The patient tolerated the procedure well without complications.  Followup: Standard post-procedure care is explained and return precautions are given.  Assessment and plan Patient will continue to receive bilateral knee injections for primary treatment of osteoarthritis.  She currently follows with  orthopedics and has been counseled on total knee replacement. Patient to follow up as needed.  Patient also going to [place] to help with weight loss.   Zettie Cooley, M.D.  7:46 PM 04/21/2020

## 2020-04-19 ENCOUNTER — Other Ambulatory Visit: Payer: Self-pay | Admitting: Family Medicine

## 2020-04-19 DIAGNOSIS — M170 Bilateral primary osteoarthritis of knee: Secondary | ICD-10-CM

## 2020-04-22 ENCOUNTER — Telehealth (INDEPENDENT_AMBULATORY_CARE_PROVIDER_SITE_OTHER): Payer: Self-pay

## 2020-04-22 ENCOUNTER — Other Ambulatory Visit: Payer: Self-pay

## 2020-04-22 DIAGNOSIS — M170 Bilateral primary osteoarthritis of knee: Secondary | ICD-10-CM

## 2020-04-22 NOTE — Telephone Encounter (Signed)
Called pt insurance to acquire preauth for abdomen US RUQ per insurance pre certification is not required call reference ID [id number]. Called [facility] LM informing of information. Renee Ramus, LPN

## 2020-04-23 ENCOUNTER — Encounter: Payer: Self-pay | Admitting: Family Medicine

## 2020-04-23 ENCOUNTER — Ambulatory Visit
Admission: RE | Admit: 2020-04-23 | Discharge: 2020-04-23 | Disposition: A | Discharge status: Home / Self Care | Payer: Medicaid Other | Source: Ambulatory Visit | Attending: Family Medicine | Admitting: Family Medicine

## 2020-04-23 DIAGNOSIS — K7689 Other specified diseases of liver: Secondary | ICD-10-CM | POA: Diagnosis not present

## 2020-04-23 DIAGNOSIS — R748 Abnormal levels of other serum enzymes: Secondary | ICD-10-CM

## 2020-04-23 MED ORDER — TRAMADOL HCL 50 MG PO TABS: 50.0000 mg | ORAL_TABLET | Freq: Two times a day (BID) | ORAL | 0 refills | Status: DC | PRN

## 2020-04-23 NOTE — Telephone Encounter (Signed)
Patient walks into clinic today asking for Tramadol refill. Please advise.

## 2020-05-02 ENCOUNTER — Other Ambulatory Visit: Payer: Self-pay

## 2020-05-02 ENCOUNTER — Encounter (INDEPENDENT_AMBULATORY_CARE_PROVIDER_SITE_OTHER): Payer: Self-pay | Admitting: Physician Assistant

## 2020-05-02 ENCOUNTER — Ambulatory Visit (INDEPENDENT_AMBULATORY_CARE_PROVIDER_SITE_OTHER): Payer: Medicaid Other | Admitting: Physician Assistant

## 2020-05-02 VITALS — BP 113/75 | HR 75 | Temp 98.5°F | Ht 60.0 in | Wt 242.0 lb

## 2020-05-02 DIAGNOSIS — Z6841 Body Mass Index (BMI) 40.0 and over, adult: Secondary | ICD-10-CM

## 2020-05-02 DIAGNOSIS — E8881 Metabolic syndrome: Secondary | ICD-10-CM

## 2020-05-02 DIAGNOSIS — E559 Vitamin D deficiency, unspecified: Secondary | ICD-10-CM | POA: Diagnosis not present

## 2020-05-02 MED ORDER — VITAMIN D (ERGOCALCIFEROL) 1.25 MG (50000 UNIT) PO CAPS: 50000.0000 [IU] | ORAL_CAPSULE | ORAL | 0 refills | Status: DC

## 2020-05-02 NOTE — Progress Notes (Signed)
Chief Complaint:   April [name] is here to discuss her progress with her obesity treatment plan along with follow-up of her obesity related diagnoses. April [name] is on the Category 2 Plan and states she is following her eating plan approximately 85% of the time. April [name] states she is walking 30 minutes 3-4 times per week.  Today's visit was #: 25 Starting weight: 256 lbs Starting date: 06/13/2019 Today's weight: 242 lbs Today's date: 05/02/2020 Total lbs lost to date: 14 Total lbs lost since last in-office visit: 0  Interim History: April [name] states that she is not always weighing her protein. She has indulged in mac-n-cheese and regular bread at restaurants since her last visit.  Subjective:   Insulin resistance. April [name] has a diagnosis of insulin resistance based on her elevated fasting insulin level >5. She continues to work on diet and exercise to decrease her risk of diabetes. April [name] is on no medication. No polyphagia.  Lab Results  Component Value Date   INSULIN 7.5 03/20/2020   INSULIN 8.6 10/24/2019   INSULIN 9.5 06/13/2019   Lab Results  Component Value Date   HGBA1C 5.5 03/20/2020   Vitamin D deficiency. April [name] is on prescription Vitamin D every 14 days and is tolerating it well.   Ref. Range 03/20/2020 10:58  Vitamin D, 25-Hydroxy Latest Ref Range: 30.0 - 100.0 ng/mL 56.9   Assessment/Plan:   Insulin resistance. April [name] will continue to work on weight loss, exercise, and decreasing simple carbohydrates to help decrease the risk of diabetes. April [name] agreed to follow-up with [place] as directed to closely monitor her progress.  Vitamin D deficiency. Low Vitamin D level contributes to fatigue and are associated with obesity, breast, and colon cancer. She was given a refill on her Vitamin D, Ergocalciferol, (DRISDOL) 1.25 MG (50000 UNIT) CAPS capsule every 14 days #2 with 0 refills and will follow-up for routine testing of Vitamin D, at least 2-3 times  per year to avoid over-replacement.   Class 3 severe obesity with serious comorbidity and body mass index (BMI) of 45.0 to 49.9 in adult, unspecified obesity type (April [name])  April [name] is currently in the action stage of change. As such, her goal is to continue with weight loss efforts. She has agreed to the Category 2 Plan.   Exercise goals: For substantial health benefits, adults should do at least 150 minutes (2 hours and 30 minutes) a week of moderate-intensity, or 75 minutes (1 hour and 15 minutes) a week of vigorous-intensity aerobic physical activity, or an equivalent combination of moderate- and vigorous-intensity aerobic activity. Aerobic activity should be performed in episodes of at least 10 minutes, and preferably, it should be spread throughout the week.  Behavioral modification strategies: keeping healthy foods in the home and ways to avoid boredom eating.  April [name] has agreed to follow-up with our clinic in 3 weeks. She was informed of the importance of frequent follow-up visits to maximize her success with intensive lifestyle modifications for her multiple health conditions.   Objective:   Blood pressure 113/75, pulse 75, temperature 98.5 F (36.9 C), temperature source Oral, height 5' (1.524 m), weight (!) 242 lb (109.8 kg), SpO2 97 %. Body mass index is 47.26 kg/m.  General: Cooperative, alert, well developed, in no acute distress. HEENT: Conjunctivae and lids unremarkable. Cardiovascular: Regular rhythm.  Lungs: Normal work of breathing. Neurologic: No focal deficits.   Lab Results  Component Value Date   CREATININE 0.75 03/20/2020   BUN 19 03/20/2020   NA 138  03/20/2020   K 4.2 03/20/2020   CL 99 03/20/2020   CO2 24 03/20/2020   Lab Results  Component Value Date   ALT 11 03/20/2020   AST 16 03/20/2020   ALKPHOS 151 (H) 03/20/2020   BILITOT 0.3 03/20/2020   Lab Results  Component Value Date   HGBA1C 5.5 03/20/2020   HGBA1C 5.4 06/13/2019   HGBA1C 5.2  11/05/2017   HGBA1C 5.4 03/02/2016   HGBA1C 5.3 02/22/2015   Lab Results  Component Value Date   INSULIN 7.5 03/20/2020   INSULIN 8.6 10/24/2019   INSULIN 9.5 06/13/2019   Lab Results  Component Value Date   TSH 1.420 06/01/2018   Lab Results  Component Value Date   CHOL 233 (H) 03/20/2020   HDL 52 03/20/2020   LDLCALC 162 (H) 03/20/2020   LDLDIRECT 108 (H) 12/01/2012   TRIG 107 03/20/2020   CHOLHDL 3.4 11/05/2017   Lab Results  Component Value Date   WBC 6.4 10/24/2019   HGB 15.1 10/24/2019   HCT 45.4 10/24/2019   MCV 85 10/24/2019   PLT 354 10/24/2019   Lab Results  Component Value Date   IRON 36 (L) 12/11/2009   TIBC 318 12/11/2009   FERRITIN 19 12/11/2009   Attestation Statements:   Reviewed by clinician on day of visit: allergies, medications, problem list, medical history, surgical history, family history, social history, and previous encounter notes.  IMichaelene Song, am acting as transcriptionist for Abby Potash, PA-C   I have reviewed the above documentation for accuracy and completeness, and I agree with the above. Abby Potash, PA-C

## 2020-05-03 ENCOUNTER — Other Ambulatory Visit: Payer: Self-pay

## 2020-05-03 ENCOUNTER — Ambulatory Visit: Payer: Medicaid Other | Admitting: Family Medicine

## 2020-05-03 VITALS — BP 122/72 | HR 68 | Ht 60.0 in | Wt 245.8 lb

## 2020-05-03 DIAGNOSIS — R5382 Chronic fatigue, unspecified: Secondary | ICD-10-CM | POA: Diagnosis not present

## 2020-05-03 DIAGNOSIS — M170 Bilateral primary osteoarthritis of knee: Secondary | ICD-10-CM | POA: Diagnosis not present

## 2020-05-03 DIAGNOSIS — Z1239 Encounter for other screening for malignant neoplasm of breast: Secondary | ICD-10-CM

## 2020-05-03 DIAGNOSIS — E8881 Metabolic syndrome: Secondary | ICD-10-CM

## 2020-05-03 DIAGNOSIS — Z6841 Body Mass Index (BMI) 40.0 and over, adult: Secondary | ICD-10-CM | POA: Diagnosis not present

## 2020-05-03 MED ORDER — TRAMADOL HCL 50 MG PO TABS: 50.0000 mg | ORAL_TABLET | Freq: Two times a day (BID) | ORAL | 0 refills | Status: DC | PRN

## 2020-05-03 NOTE — Progress Notes (Signed)
    SUBJECTIVE:   CHIEF COMPLAINT / HPI:   Patient denies any complaints today, comes in to refill tramadol for her chronic right knee pain. She denies chest pain, dyspnea, fatigue and weakness.   PERTINENT  PMH / PSH:  Insulin resistance Patient in healthy weight and wellness program for over a year now. She states that she has lost 22-23 pounds over the last year which is how long she has participated in the program. Also has been eating a healthy diet of fruits and vegetables. States that she has more free time because here kids are off to college.  Osteoarthritis History of chronic right knee pain. Takes tramadol and uses voltaren gel. Currently denies any pain. Does not interfere with her ADLs and is even able to exercise multiple times a week.   Fatigue Patient reports tired and less energy levels on PHQ-9 but denies that it interferes with her daily life and physical activity. She denies any fatigue, dizziness or weakness.  Obesity Patient has been participating in healthy weight and wellness for the past year has lost about 22 pounds. States that she feels well. Trying to avoid knee surgery for her other knee. OBJECTIVE:   BP 122/72   Pulse 68   Ht 5' (1.524 m)   Wt (!) 245 lb 12.8 oz (111.5 kg)   SpO2 97%   BMI 48.00 kg/m   General: patient in no acute distress Cardio: regular rate and rhythm, no murmurs or gallops noted Resp: lungs clear to auscultation bilaterally, no rhonchi or rales appreciated MSK: both knees exhibit full active ROM, no deformities noted Abdomen: nontender, active bowel sounds Psych: mood appropriate  ASSESSMENT/PLAN:   Insulin resistance Last A1c 5.5 on 03/20/2020. Encouraged to continue to eat a healthy diet and participate in healthy weight and wellness program. Will continue to monitor A1c at next visit if needed  Osteoarthritis of knees, bilateral -Tramadol refilled -continue to use voltaren gel as needed -counseled on encouragement of  continuing to exercise as it can assist with knee pain   Fatigue -Not interfering with daily life, encouraged to continue to stay physically active and incorporate healthy diet   Class 3 severe obesity with serious comorbidity and body mass index (BMI) of 45.0 to 49.9 in adult [facility]) -Encouraged patient to continue physical activity -Continue participation in healthy weight and wellness program     Donney Dice, [place]

## 2020-05-03 NOTE — Patient Instructions (Addendum)
It was very nice meeting you today, I look forward to continuing to see you.  Please continue to eat a healthy diet and exercise. It is very commendable that you have been participating in the healthy weight and wellness program for the past year. Keep continuing with great prevention. You will be contacted soon regarding scheduling your mammogram.   I will see you again in 3-6 months, please come in sooner if needed, If you have any concerns, please don't hesitate to contact our office.   Thank you for allowing me to be a part of your care.

## 2020-05-03 NOTE — Assessment & Plan Note (Signed)
-  Encouraged patient to continue physical activity -Continue participation in healthy weight and wellness program

## 2020-05-03 NOTE — Assessment & Plan Note (Signed)
-  Tramadol refilled -continue to use voltaren gel as needed -counseled on encouragement of continuing to exercise as it can assist with knee pain

## 2020-05-03 NOTE — Addendum Note (Signed)
Addended by: Lissa Morales D on: 05/03/2020 04:54 PM   Modules accepted: Orders

## 2020-05-03 NOTE — Assessment & Plan Note (Signed)
Last A1c 5.5 on 03/20/2020. Encouraged to continue to eat a healthy diet and participate in healthy weight and wellness program. Will continue to monitor A1c at next visit if needed

## 2020-05-03 NOTE — Assessment & Plan Note (Signed)
-  Not interfering with daily life, encouraged to continue to stay physically active and incorporate healthy diet

## 2020-05-10 ENCOUNTER — Other Ambulatory Visit: Payer: Self-pay | Admitting: Family Medicine

## 2020-05-10 DIAGNOSIS — Z1231 Encounter for screening mammogram for malignant neoplasm of breast: Secondary | ICD-10-CM

## 2020-05-11 ENCOUNTER — Emergency Department (HOSPITAL_COMMUNITY)
Admission: EM | Admit: 2020-05-11 | Discharge: 2020-05-11 | Disposition: A | Discharge status: Home / Self Care | Payer: Medicaid Other | Attending: Emergency Medicine | Admitting: Emergency Medicine

## 2020-05-11 ENCOUNTER — Other Ambulatory Visit: Payer: Self-pay

## 2020-05-11 DIAGNOSIS — Z79899 Other long term (current) drug therapy: Secondary | ICD-10-CM | POA: Diagnosis not present

## 2020-05-11 DIAGNOSIS — Z87891 Personal history of nicotine dependence: Secondary | ICD-10-CM | POA: Insufficient documentation

## 2020-05-11 DIAGNOSIS — Z96652 Presence of left artificial knee joint: Secondary | ICD-10-CM | POA: Insufficient documentation

## 2020-05-11 DIAGNOSIS — M25562 Pain in left knee: Secondary | ICD-10-CM | POA: Diagnosis not present

## 2020-05-11 MED ORDER — OXYCODONE-ACETAMINOPHEN 5-325 MG PO TABS: 1.0000 | ORAL_TABLET | ORAL | 0 refills | Status: AC | PRN

## 2020-05-11 MED ORDER — PREDNISONE 20 MG PO TABS
20.0000 mg | ORAL_TABLET | Freq: Every day | ORAL | 0 refills | Status: AC
Start: 2020-05-11 — End: 2020-05-15

## 2020-05-11 MED ORDER — LIDOCAINE 5 % EX PTCH: 1.0000 | MEDICATED_PATCH | CUTANEOUS | 0 refills | Status: DC

## 2020-05-11 NOTE — ED Notes (Signed)
Discharge paperwork and prescriptions reviewed with pt. Pt ambulatory at time of discharge. No questions or concerns voiced by pt.

## 2020-05-11 NOTE — Discharge Instructions (Signed)
Follow up with the orthopedic surgeon within the next few days for possible injection to your knee of the viscous solution that we talked about.  I have written you for a short course of the oxycodone which you stated you have taken before.  Do not take the tramadol in addition to this.  Please see lidocaine patch to your left knee.  Leave on for 12 hours and remove for 12 hours.  You must be patch free for 12 hours before placing additional patch.  Return for any worsening symptoms.
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
## 2020-05-11 NOTE — ED Triage Notes (Signed)
Patient reports left knee pain 9/10 starting yesterday. States she has osteoarthritis and it is flaring up.

## 2020-05-13 ENCOUNTER — Telehealth: Payer: Self-pay | Admitting: *Deleted

## 2020-05-13 DIAGNOSIS — M170 Bilateral primary osteoarthritis of knee: Secondary | ICD-10-CM

## 2020-05-13 NOTE — Telephone Encounter (Signed)
Contacted pt to complete transition of care assessment:   Transition Care Management Follow-up Telephone Call  . Medicaid Managed Care Transition Call Status:MM [facility] Call Made  . Date of discharge and from where: [facility], 05/11/20  . How have you been since you were released from the hospital? "better"  . Any questions or concerns?  Injection in left knee; new insurance "[employer]" requires scripts to be written by PCP, but since it came from the ED, she was not able to use her Medicaid for that because she is I a "lock in period"  Items Reviewed: [name] Kitchen Did the pt receive and understand the discharge instructions provided? Yes  . Medications obtained and verified? Yes  . Any new allergies since your discharge? No  . Dietary orders reviewed? Pt in plan with healthy wellness to lose weght . Do you have support at home? No, pt feels like she is lacking support from the community Functional Questionnaire: (I = Independent and D = Dependent)  ADLs: Independent Bathing/Dressing:Independent Meal Prep: Independent Eating: Independent Maintaining continence: Independent Transferring/Ambulation: Independent Managing Meds: Independent   Follow up appointments reviewed:  PCP Hospital f/u appt confirmed? No  Pt to call PCP Dr Donney Dice to schedule follow up appt   [facility] f/u appt confirmed?  Scheduled to see Orthopedic MD on 05/14/20 @0945   Are transportation arrangements needed? No   If their condition worsens, is the pt aware to call PCP or go to the EmergencyDept.? Yes  Was the patient provided with contact information for the PCP's office or ED? yes  Was to pt encouraged to call back with questions or concerns? yes  Order placed to [place] for Case Management.  Lenor Coffin, RN, BSN, [place] [place] [phone]

## 2020-05-14 DIAGNOSIS — M1712 Unilateral primary osteoarthritis, left knee: Secondary | ICD-10-CM | POA: Diagnosis not present

## 2020-05-16 ENCOUNTER — Ambulatory Visit: Payer: Self-pay

## 2020-05-17 ENCOUNTER — Ambulatory Visit: Payer: Self-pay

## 2020-05-18 ENCOUNTER — Other Ambulatory Visit: Payer: Self-pay | Admitting: Pharmacist

## 2020-05-18 NOTE — Chronic Care Management (AMB) (Signed)
Care Coordination - Pharmacy  05/18/2020  April [name] Mar 29, 1961 [id number]  Subjective:  April [name] is an 59 y.o. year old female who is a primary patient of Ganta, Anupa, DO.    April [name] was given information about Medicaid Managed Care team care coordination services today. April [name] agreed to services and verbal consent obtained  Review of patient status, laboratory and other test data was performed as part of evaluation for provision of services.  SDOH:   SDOH Screenings   Alcohol Screen:   . Last Alcohol Screening Score (AUDIT):   Depression (PHQ2-9): Low Risk   . PHQ-2 Score: 1  Financial Resource Strain:   . Difficulty of Paying Living Expenses:   Food Insecurity:   . Worried About Charity fundraiser in the Last Year:   . [place] in the Last Year:   Housing:   . Last Housing Risk Score:   Physical Activity: Insufficiently Active  . Days of Exercise per Week: 3 days  . Minutes of Exercise per Session: 30 min  Social Connections:   . Frequency of Communication with Friends and Family:   . Frequency of Social Gatherings with Friends and Family:   . Attends Religious Services:   . Active Member of Clubs or Organizations:   . Attends Archivist Meetings:   [name] Kitchen Marital Status:   Stress:   . Feeling of Stress :   Tobacco Use: Medium Risk  . Smoking Tobacco Use: Former Smoker  . Smokeless Tobacco Use: Never Used  Transportation Needs:   . Film/video editor (Medical):   [name] Kitchen Lack of Transportation (Non-Medical):      Objective:  Allergies  Allergen Reactions  . Percocet [Oxycodone-Acetaminophen] Swelling  . Naproxen Palpitations    Medications:  Medications Reviewed Today    Reviewed by Tana Conch, [facility] (Pharmacist) on 05/18/20 at 1434  Med List Status: <None>  Medication Order Taking? Sig Documenting Provider Last Dose Status Informant  diclofenac Sodium (VOLTAREN) 1 % GEL [id number] No APPLY 2 GRAMS  TOPICALLY 4 TIMES A DAY Abraham, Sherin, DO Taking Active   hydrochlorothiazide (HYDRODIURIL) 25 MG tablet [id number] No Take 1 tablet (25 mg total) by mouth daily. Briscoe Deutscher, DO Taking Active   lidocaine (LIDODERM) 5 % [id number]  Place 1 patch onto the skin daily. Remove & Discard patch within 12 hours or as directed by MD Henderly, Britni A, PA-C  Active   tacrolimus (PROTOPIC) 0.03 % ointment [id number] No Apply topically. [provider] Taking Active   traMADol (ULTRAM) 50 MG tablet [id number]  Take 1 tablet (50 mg total) by mouth every 12 (twelve) hours as needed. McDiarmid, Blane Ohara, MD  Active   triamcinolone ointment (KENALOG) 0.1 % [id number] No Apply 1 application topically 2 (two) times daily. Zenia Resides, MD Taking Active   Vitamin D, Ergocalciferol, (DRISDOL) 1.25 MG (50000 UNIT) CAPS capsule [id number]  Take 1 capsule (50,000 Units total) by mouth every 14 (fourteen) days. Abby Potash, PA-C  Active           Assessment:  Goals Addressed              This Visit's Progress   .  Patient Stated she wants to lose more weight. (pt-stated)   On track     Patient states she has lost 25 pounds over the last 9 months.  She would like to reach her goal of weighing 180 pounds.  She is currently  enrolled in the Health and Wellness program and has a weight check every 3 weeks.  Patient states she has labwork done every 3 months.       Plan:

## 2020-05-18 NOTE — Patient Outreach (Signed)
   Care Coordination - Pharmacy  05/18/2020  April [name] 09-04-1961 [id number]  Subjective:  April [name] is an 59 y.o. year old female who is a primary patient of Ganta, Anupa, DO.    April [name] was given information about Medicaid Managed Care team care coordination services today. April [name] agreed to services and verbal consent obtained  Review of patient status, laboratory and other test data was performed as part of evaluation for provision of services.  SDOH:   SDOH Screenings   Alcohol Screen:   . Last Alcohol Screening Score (AUDIT):   Depression (PHQ2-9): Low Risk   . PHQ-2 Score: 1  Financial Resource Strain:   . Difficulty of Paying Living Expenses:   Food Insecurity:   . Worried About Charity fundraiser in the Last Year:   . [name] in the Last Year:   Housing:   . Last Housing Risk Score:   Physical Activity: Insufficiently Active  . Days of Exercise per Week: 3 days  . Minutes of Exercise per Session: 30 min  Social Connections:   . Frequency of Communication with Friends and Family:   . Frequency of Social Gatherings with Friends and Family:   . Attends Religious Services:   . Active Member of Clubs or Organizations:   . Attends Archivist Meetings:   [name] Kitchen Marital Status:   Stress:   . Feeling of Stress :   Tobacco Use: Medium Risk  . Smoking Tobacco Use: Former Smoker  . Smokeless Tobacco Use: Never Used  Transportation Needs:   . Film/video editor (Medical):   [name] Kitchen Lack of Transportation (Non-Medical):      Objective:  Allergies  Allergen Reactions  . Percocet [Oxycodone-Acetaminophen] Swelling  . Naproxen Palpitations    Medications:  Medications Reviewed Today    Reviewed by April Dice, DO (Resident) on 05/03/20 at 1450  Med List Status: <None>  Medication Order Taking? Sig Documenting Provider Last Dose Status Informant  diclofenac Sodium (VOLTAREN) 1 % GEL [id number] No APPLY 2 GRAMS TOPICALLY  4 TIMES A DAY Abraham, Sherin, DO Taking Active   hydrochlorothiazide (HYDRODIURIL) 25 MG tablet [id number] No Take 1 tablet (25 mg total) by mouth daily. April Deutscher, DO Taking Active   tacrolimus (PROTOPIC) 0.03 % ointment [id number] No Apply topically. [provider] Taking Active   traMADol (ULTRAM) 50 MG tablet [id number] No Take 1 tablet (50 mg total) by mouth every 12 (twelve) hours as needed. April Covert, MD Taking Active   triamcinolone ointment (KENALOG) 0.1 % [id number] No Apply 1 application topically 2 (two) times daily. April Resides, MD Taking Active   Vitamin D, Ergocalciferol, (DRISDOL) 1.25 MG (50000 UNIT) CAPS capsule [id number]  Take 1 capsule (50,000 Units total) by mouth every 14 (fourteen) days. April Potash, PA-C  Active           Assessment:  Goals Addressed   None     Plan:

## 2020-05-18 NOTE — Chronic Care Management (AMB) (Signed)
Care Coordination - Pharmacy  05/18/2020  LATIMA HAMZA 05-01-1961 [id number]  Subjective:  NIDHI JACOME is an 59 y.o. year old female who is a primary patient of Ganta, Anupa, DO.    Ms. Shankles was given information about Medicaid Managed Care team care coordination services today. [name] agreed to services and verbal consent obtained  Review of patient status, laboratory and other test data was performed as part of evaluation for provision of services.  SDOH:   SDOH Screenings   Alcohol Screen:   . Last Alcohol Screening Score (AUDIT):   Depression (PHQ2-9): Low Risk   . PHQ-2 Score: 1  Financial Resource Strain:   . Difficulty of Paying Living Expenses:   Food Insecurity:   . Worried About Charity fundraiser in the Last Year:   . [place] in the Last Year:   Housing:   . Last Housing Risk Score:   Physical Activity: Insufficiently Active  . Days of Exercise per Week: 3 days  . Minutes of Exercise per Session: 30 min  Social Connections:   . Frequency of Communication with Friends and Family:   . Frequency of Social Gatherings with Friends and Family:   . Attends Religious Services:   . Active Member of Clubs or Organizations:   . Attends Archivist Meetings:   [name] Kitchen Marital Status:   Stress:   . Feeling of Stress :   Tobacco Use: Medium Risk  . Smoking Tobacco Use: Former Smoker  . Smokeless Tobacco Use: Never Used  Transportation Needs:   . Film/video editor (Medical):   [name] Kitchen Lack of Transportation (Non-Medical):      Objective:  Allergies  Allergen Reactions  . Percocet [Oxycodone-Acetaminophen] Swelling  . Naproxen Palpitations    Medications:  Medications Reviewed Today    Reviewed by Donney Dice, DO (Resident) on 05/03/20 at 1450  Med List Status: <None>  Medication Order Taking? Sig Documenting Provider Last Dose Status Informant  diclofenac Sodium (VOLTAREN) 1 % GEL [id number] No APPLY 2 GRAMS TOPICALLY  4 TIMES A DAY Abraham, Sherin, DO Taking Active   hydrochlorothiazide (HYDRODIURIL) 25 MG tablet [id number] No Take 1 tablet (25 mg total) by mouth daily. Briscoe Deutscher, DO Taking Active   tacrolimus (PROTOPIC) 0.03 % ointment [id number] No Apply topically. [provider] Taking Active   traMADol (ULTRAM) 50 MG tablet [id number] No Take 1 tablet (50 mg total) by mouth every 12 (twelve) hours as needed. Lind Covert, MD Taking Active   triamcinolone ointment (KENALOG) 0.1 % [id number] No Apply 1 application topically 2 (two) times daily. Zenia Resides, MD Taking Active   Vitamin D, Ergocalciferol, (DRISDOL) 1.25 MG (50000 UNIT) CAPS capsule [id number]  Take 1 capsule (50,000 Units total) by mouth every 14 (fourteen) days. Abby Potash, PA-C  Active           Assessment:  Goals Addressed              This Visit's Progress   .  Patient Stated she wants to lose more weight. (pt-stated)   On track     Patient states she has lost 25 pounds over the last 9 months.  She would like to reach her goal of weighing 180 pounds.  She is currently enrolled in the Health and Wellness program and has a weight check every 3 weeks.  Patient states she has labwork done every 3 months.       Plan:

## 2020-05-18 NOTE — Patient Instructions (Signed)
Follow up with PCP at next appointment on Monday, August 9.

## 2020-05-18 NOTE — Patient Instructions (Signed)
Continue weight loss program.  Follow up with PCP at next appointment on May 20, 2020.

## 2020-05-20 ENCOUNTER — Other Ambulatory Visit: Payer: Self-pay

## 2020-05-20 ENCOUNTER — Ambulatory Visit (INDEPENDENT_AMBULATORY_CARE_PROVIDER_SITE_OTHER): Payer: Medicaid Other | Admitting: Family Medicine

## 2020-05-20 ENCOUNTER — Telehealth: Payer: Self-pay

## 2020-05-20 ENCOUNTER — Encounter: Payer: Self-pay | Admitting: Family Medicine

## 2020-05-20 DIAGNOSIS — Z6841 Body Mass Index (BMI) 40.0 and over, adult: Secondary | ICD-10-CM | POA: Diagnosis not present

## 2020-05-20 DIAGNOSIS — E559 Vitamin D deficiency, unspecified: Secondary | ICD-10-CM

## 2020-05-20 DIAGNOSIS — M170 Bilateral primary osteoarthritis of knee: Secondary | ICD-10-CM

## 2020-05-20 MED ORDER — VITAMIN D (ERGOCALCIFEROL) 1.25 MG (50000 UNIT) PO CAPS: 50000.0000 [IU] | ORAL_CAPSULE | ORAL | 0 refills | Status: DC

## 2020-05-20 MED ORDER — DICLOFENAC SODIUM 1 % EX GEL: CUTANEOUS | 1 refills | Status: DC

## 2020-05-20 MED ORDER — TRAMADOL HCL 50 MG PO TABS: 50.0000 mg | ORAL_TABLET | Freq: Two times a day (BID) | ORAL | 0 refills | Status: DC | PRN

## 2020-05-20 NOTE — Assessment & Plan Note (Signed)
-  Refilled Vitamin D at this visit, instructed to take as prescribed

## 2020-05-20 NOTE — Progress Notes (Signed)
    SUBJECTIVE:   CHIEF COMPLAINT / HPI:   Osteoarthritis  Patient presents with past medical history of osteoarthritis. Presented to the ED on 05/11/2020 for worsening left knee pain with edema and erythema. Rated pain at the time 10/10. Given prednisone and oxycodone at the hospital. Today, patient states that she is doing better, rates her pain an 8/10. Would like refills on some of her meds. Would also like prednisone and oxycodone. Regularly follows up with Dr. Noemi Chapel for corticosteroid injections. Patient endorses that she previously used to receive viscous injections which she states helped control her pain but there is currently an authorization issue. Takes tramadol and voltaren gel at home, states that this helps her.  PERTINENT  PMH / PSH:   Obesity Patient participating in healthy weight and wellness. Goes in every 3 weeks for a check up. States that she currently exercises 3x/week. Goal is to not only lose weight but to avoid left knee surgery.   Vitamin D deficiency Currently taking Vitamin D. Last Vitamin D level was within normal limits in 03/2020.  OBJECTIVE:   BP 132/60   Pulse 71   Ht 5' (1.524 m)   Wt 244 lb (110.7 kg)   SpO2 98%   BMI 47.65 kg/m   General: Patient well-appearing, in no acute distress. Cardio: regular rate and rhythm, no murmurs or gallops appreciated Resp: lungs clear to auscultation bilaterally, no rales or rhonchi noted Abdomen: nontender, active bowel sounds Ext: distal pulses intact bilaterally, normal active ROM in both right and left knee, no edema or erythema noted on knees bilaterally, no crepitus noted  Neuro: normal gait Psych: mood appropriate    ASSESSMENT/PLAN:   Osteoarthritis of knees, bilateral -Tramadol and voltaren gel refilled, continue these as prescribed -Follows up with Dr. Noemi Chapel next month, patient will contact their office regarding authorization status for viscous injection. Likely will continue to receive regular  corticosteroid injections every 3 months with ortho. -Encouraged to continue regular physical activity  -Discouraged from prednisone due to no acute changes at this time and adverse effect of negatively affecting glucose levels  -Discouraged from oxycodone, had extensive conversation with patient on wanting to manage pain control with less amount of medication possible  -Did not prescribe prednisone and oxycodone -Discussed with patient that the long-term goal of weight loss is what we will continue to focus on to possibly avoid another knee surgery and improve overall health and wellness.  Class 3 severe obesity with serious comorbidity and body mass index (BMI) of 45.0 to 49.9 in adult [facility]) -Encouraged to continue health and wellness program, exercise 3x/week   Vitamin D deficiency -Refilled Vitamin D at this visit, instructed to take as prescribed    Health maintenance -Mammogram scheduled for 06/07/2020.  Donney Dice, [place]

## 2020-05-20 NOTE — Patient Instructions (Addendum)
It was so great seeing you again today!  I am glad you are feeling better, please continue to take tramadol and voltaren gel to manage your knee pain. Definitely continue to be physically active, it is so great that you are incorporating exercise into your day 3 times a week. I have order refills for tramadol, voltaren gel and Vitamin D.  We will see you back for follow up in 6 months. Please let us know if you have any further questions or concerns, we can always see you sooner if needed. Take care!

## 2020-05-20 NOTE — Assessment & Plan Note (Addendum)
-  Tramadol and voltaren gel refilled, continue these as prescribed -Follows up with Dr. Noemi Chapel next month, patient will contact their office regarding authorization status for viscous injection. Likely will continue to receive regular corticosteroid injections every 3 months with ortho. -Encouraged to continue regular physical activity  -Discouraged from prednisone due to no acute changes at this time and adverse effect of negatively affecting glucose levels  -Discouraged from oxycodone, had extensive conversation with patient on wanting to manage pain control with less amount of medication possible  -Did not prescribe prednisone and oxycodone -Discussed with patient that the long-term goal of weight loss is what we will continue to focus on to possibly avoid another knee surgery and improve overall health and wellness.

## 2020-05-20 NOTE — Telephone Encounter (Signed)
Good evening, I am unable to prescribe controlled substances at this time since my application is still pending. Therefore I have asked Dr. McDiarmid to prescribe April [name]'s tramadol. I believe Dr. McDiarmid also prescribed tramadol at the last visit as well. Please let me know if there is anything I need to do, thank you

## 2020-05-20 NOTE — Telephone Encounter (Signed)
Patient returns to clinic with concerns regarding tramadol refill. Called and spoke with pharmacist. Stated that patient is "locked in" with certain providers that can send in controlled substances and that Dr. Wendy Poet is not one of those providers. Pharmacist is requesting that Dr. Larae Grooms resend the rx for patient.   Forwarding to PCP  Talbot Grumbling, RN

## 2020-05-20 NOTE — Assessment & Plan Note (Signed)
-  Encouraged to continue health and wellness program, exercise 3x/week

## 2020-05-21 NOTE — Telephone Encounter (Signed)
Pt informed.  She will call [facility] and let them know to add Dr. McDiarmid.  Christen Bame, CMA

## 2020-05-22 NOTE — Telephone Encounter (Signed)
Thank you :)

## 2020-05-23 ENCOUNTER — Telehealth: Payer: Self-pay | Admitting: *Deleted

## 2020-05-23 NOTE — Telephone Encounter (Addendum)
    LM on store VM . Christen Bame, CMA

## 2020-05-23 NOTE — Telephone Encounter (Signed)
Received fax from pharmacy, PA needed on Tramadol.  Clinical questions submitted via Cover My Meds.  Waiting on response, could take up to 72 hours.  Cover My Meds info: Key: [id number]  Christen Bame, CMA

## 2020-05-27 ENCOUNTER — Encounter (INDEPENDENT_AMBULATORY_CARE_PROVIDER_SITE_OTHER): Payer: Self-pay | Admitting: Physician Assistant

## 2020-05-27 ENCOUNTER — Other Ambulatory Visit: Payer: Self-pay

## 2020-05-27 ENCOUNTER — Ambulatory Visit (INDEPENDENT_AMBULATORY_CARE_PROVIDER_SITE_OTHER): Payer: Medicaid Other | Admitting: Physician Assistant

## 2020-05-27 VITALS — BP 127/83 | HR 63 | Temp 98.3°F | Ht 60.0 in | Wt 241.0 lb

## 2020-05-27 DIAGNOSIS — E559 Vitamin D deficiency, unspecified: Secondary | ICD-10-CM

## 2020-05-27 DIAGNOSIS — R609 Edema, unspecified: Secondary | ICD-10-CM

## 2020-05-27 DIAGNOSIS — Z6841 Body Mass Index (BMI) 40.0 and over, adult: Secondary | ICD-10-CM | POA: Diagnosis not present

## 2020-05-27 DIAGNOSIS — I10 Essential (primary) hypertension: Secondary | ICD-10-CM | POA: Diagnosis not present

## 2020-05-27 MED ORDER — HYDROCHLOROTHIAZIDE 25 MG PO TABS: 25.0000 mg | ORAL_TABLET | Freq: Every day | ORAL | 0 refills | Status: DC

## 2020-05-28 ENCOUNTER — Ambulatory Visit: Payer: Medicaid Other | Admitting: Family Medicine

## 2020-05-28 NOTE — Progress Notes (Signed)
Chief Complaint:   April [name] is here to discuss her progress with her obesity treatment plan along with follow-up of her obesity related diagnoses. April [name] is on the Category 2 Plan and states she is following her eating plan approximately 80% of the time. April [name] states she is doing home exercises 10-15 minutes 6-7 times per week.  Today's visit was #: 20 Starting weight: 256 lbs Starting date: 06/13/2019 Today's weight: 241 lbs Today's date: 05/27/2020 Total lbs lost to date: 15 Total lbs lost since last in-office visit: 1  Interim History: April [name] states that she is not always eating her protein due to financial reasons. Protein substitutions were given today.  Subjective:   Essential hypertension. April [name] is on HCTZ. Blood pressure is controlled. No  chest pain or headache.   BP Readings from Last 3 Encounters:  05/27/20 127/83  05/20/20 132/60  05/11/20 119/82   Lab Results  Component Value Date   CREATININE 0.75 03/20/2020   CREATININE 0.68 11/22/2019   CREATININE 0.60 10/24/2019      Vitamin D deficiency. April [name] is on Vitamin D every other week. No nausea, vomiting, or muscle weakness.    Ref. Range 03/20/2020 10:58  Vitamin D, 25-Hydroxy Latest Ref Range: 30.0 - 100.0 ng/mL 56.9   Assessment/Plan:   Essential hypertension. April [name] is working on healthy weight loss and exercise to improve blood pressure control. We will watch for signs of hypotension as she continues her lifestyle modifications. Refill was given for hydrochlorothiazide (HYDRODIURIL) 25 MG tablet #30 with 0 refills.  Vitamin D deficiency. Low Vitamin D level contributes to fatigue and are associated with obesity, breast, and colon cancer. She agrees to continue to take Vitamin D as directed and will follow-up for routine testing of Vitamin D, at least 2-3 times per year to avoid over-replacement.  Class 3 severe obesity with serious comorbidity and body mass index (BMI) of  45.0 to 49.9 in adult, unspecified obesity type ([place]).  April [name] is currently in the action stage of change. As such, her goal is to continue with weight loss efforts. She has agreed to the Category 2 Plan.   Exercise goals: For substantial health benefits, adults should do at least 150 minutes (2 hours and 30 minutes) a week of moderate-intensity, or 75 minutes (1 hour and 15 minutes) a week of vigorous-intensity aerobic physical activity, or an equivalent combination of moderate- and vigorous-intensity aerobic activity. Aerobic activity should be performed in episodes of at least 10 minutes, and preferably, it should be spread throughout the week.  Behavioral modification strategies: increasing lean protein intake and planning for success.  April [name] has agreed to follow-up with our clinic in 3 weeks. She was informed of the importance of frequent follow-up visits to maximize her success with intensive lifestyle modifications for her multiple health conditions.   Objective:   Blood pressure 127/83, pulse 63, temperature 98.3 F (36.8 C), temperature source Oral, height 5' (1.524 m), weight 241 lb (109.3 kg), SpO2 98 %. Body mass index is 47.07 kg/m.  General: Cooperative, alert, well developed, in no acute distress. HEENT: Conjunctivae and lids unremarkable. Cardiovascular: Regular rhythm.  Lungs: Normal work of breathing. Neurologic: No focal deficits.   Lab Results  Component Value Date   CREATININE 0.75 03/20/2020   BUN 19 03/20/2020   NA 138 03/20/2020   K 4.2 03/20/2020   CL 99 03/20/2020   CO2 24 03/20/2020   Lab Results  Component Value Date   ALT 11 03/20/2020  AST 16 03/20/2020   ALKPHOS 151 (H) 03/20/2020   BILITOT 0.3 03/20/2020   Lab Results  Component Value Date   HGBA1C 5.5 03/20/2020   HGBA1C 5.4 06/13/2019   HGBA1C 5.2 11/05/2017   HGBA1C 5.4 03/02/2016   HGBA1C 5.3 02/22/2015   Lab Results  Component Value Date   INSULIN 7.5 03/20/2020   INSULIN  8.6 10/24/2019   INSULIN 9.5 06/13/2019   Lab Results  Component Value Date   TSH 1.420 06/01/2018   Lab Results  Component Value Date   CHOL 233 (H) 03/20/2020   HDL 52 03/20/2020   LDLCALC 162 (H) 03/20/2020   LDLDIRECT 108 (H) 12/01/2012   TRIG 107 03/20/2020   CHOLHDL 3.4 11/05/2017   Lab Results  Component Value Date   WBC 6.4 10/24/2019   HGB 15.1 10/24/2019   HCT 45.4 10/24/2019   MCV 85 10/24/2019   PLT 354 10/24/2019   Lab Results  Component Value Date   IRON 36 (L) 12/11/2009   TIBC 318 12/11/2009   FERRITIN 19 12/11/2009   Attestation Statements:   Reviewed by clinician on day of visit: allergies, medications, problem list, medical history, surgical history, family history, social history, and previous encounter notes.  IMichaelene Song, am acting as transcriptionist for Abby Potash, PA-C   I have reviewed the above documentation for accuracy and completeness, and I agree with the above. Abby Potash, PA-C

## 2020-06-07 ENCOUNTER — Ambulatory Visit
Admission: RE | Admit: 2020-06-07 | Discharge: 2020-06-07 | Disposition: A | Discharge status: Home / Self Care | Payer: Medicaid Other | Source: Ambulatory Visit | Attending: Family Medicine | Admitting: Family Medicine

## 2020-06-07 ENCOUNTER — Other Ambulatory Visit: Payer: Self-pay

## 2020-06-07 DIAGNOSIS — Z1231 Encounter for screening mammogram for malignant neoplasm of breast: Secondary | ICD-10-CM | POA: Diagnosis not present

## 2020-06-13 ENCOUNTER — Encounter (HOSPITAL_COMMUNITY): Payer: Self-pay

## 2020-06-13 ENCOUNTER — Ambulatory Visit (HOSPITAL_COMMUNITY)
Admission: EM | Admit: 2020-06-13 | Discharge: 2020-06-13 | Disposition: A | Discharge status: Home / Self Care | Payer: Medicaid Other | Attending: Urgent Care | Admitting: Urgent Care

## 2020-06-13 ENCOUNTER — Other Ambulatory Visit: Payer: Self-pay

## 2020-06-13 ENCOUNTER — Other Ambulatory Visit: Payer: Self-pay | Admitting: Family Medicine

## 2020-06-13 DIAGNOSIS — M1712 Unilateral primary osteoarthritis, left knee: Secondary | ICD-10-CM | POA: Diagnosis not present

## 2020-06-13 DIAGNOSIS — E559 Vitamin D deficiency, unspecified: Secondary | ICD-10-CM

## 2020-06-13 DIAGNOSIS — M25562 Pain in left knee: Secondary | ICD-10-CM

## 2020-06-13 MED ORDER — PREDNISONE 20 MG PO TABS
ORAL_TABLET | ORAL | 0 refills | Status: DC
Start: 2020-06-13 — End: 2020-07-31
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
## 2020-06-13 NOTE — ED Triage Notes (Signed)
Pt presents with complaints of arthritis flare up in left knee. States she is having pain and swelling. Reports using tramadol at home for pain with no relief x 2 days. She goes to see her doctor on Tuesday.

## 2020-06-18 DIAGNOSIS — M1712 Unilateral primary osteoarthritis, left knee: Secondary | ICD-10-CM | POA: Diagnosis not present

## 2020-06-19 ENCOUNTER — Encounter (INDEPENDENT_AMBULATORY_CARE_PROVIDER_SITE_OTHER): Payer: Self-pay | Admitting: Physician Assistant

## 2020-06-19 ENCOUNTER — Ambulatory Visit (INDEPENDENT_AMBULATORY_CARE_PROVIDER_SITE_OTHER): Payer: Medicaid Other | Admitting: Physician Assistant

## 2020-06-19 ENCOUNTER — Other Ambulatory Visit: Payer: Self-pay

## 2020-06-19 VITALS — BP 112/71 | HR 85 | Temp 99.0°F | Ht 60.0 in | Wt 241.0 lb

## 2020-06-19 DIAGNOSIS — E559 Vitamin D deficiency, unspecified: Secondary | ICD-10-CM | POA: Diagnosis not present

## 2020-06-19 DIAGNOSIS — Z6841 Body Mass Index (BMI) 40.0 and over, adult: Secondary | ICD-10-CM

## 2020-06-19 DIAGNOSIS — E7849 Other hyperlipidemia: Secondary | ICD-10-CM

## 2020-06-19 MED ORDER — VITAMIN D (ERGOCALCIFEROL) 1.25 MG (50000 UNIT) PO CAPS: 50000.0000 [IU] | ORAL_CAPSULE | ORAL | 0 refills | Status: DC

## 2020-06-20 NOTE — Progress Notes (Signed)
Chief Complaint:   OBESITY April [name] is here to discuss her progress with her obesity treatment plan along with follow-up of her obesity related diagnoses. April [name] is on the Category 2 Plan and states she is following her eating plan approximately 80% of the time. April [name] states she is occasionally walking for 30-40.  Today's visit was #: 21 Starting weight: 256 lbs Starting date: 06/13/2019 Today's weight: 241 lbs Today's date: 06/19/2020 Total lbs lost to date: 15 lbs Total lbs lost since last in-office visit: 0  Interim History: April [name] reports that she is not getting enough protein everyday.  She is not currently journaling.  She does not want to eat much meat recently.  Subjective:   1. Other hyperlipidemia April [name] has hyperlipidemia and has been trying to improve her cholesterol levels with intensive lifestyle modification including a low saturated fat diet, exercise and weight loss. She denies any chest pain, claudication or myalgias.  Last TC and LDL not at goal.  No medications.  Lab Results  Component Value Date   ALT 11 03/20/2020   AST 16 03/20/2020   ALKPHOS 151 (H) 03/20/2020   BILITOT 0.3 03/20/2020   Lab Results  Component Value Date   CHOL 233 (H) 03/20/2020   HDL 52 03/20/2020   LDLCALC 162 (H) 03/20/2020   LDLDIRECT 108 (H) 12/01/2012   TRIG 107 03/20/2020   CHOLHDL 3.4 11/05/2017   2. Vitamin D deficiency April [name]'s Vitamin D level was 56.9 on 03/20/2020. She is currently taking prescription vitamin D 50,000 IU each week. She denies nausea, vomiting or muscle weakness.  She is walking outside periodically.  Assessment/Plan:   1. Other hyperlipidemia Cardiovascular risk and specific lipid/LDL goals reviewed.  We discussed several lifestyle modifications today and April [name] will continue to work on diet, exercise and weight loss efforts. Orders and follow up as documented in patient record.  Follow-up with PCP regarding elevated LDL.  Will check labs at  next visit.  Counseling Intensive lifestyle modifications are the first line treatment for this issue. . Dietary changes: Increase soluble fiber. Decrease simple carbohydrates. . Exercise changes: Moderate to vigorous-intensity aerobic activity 150 minutes per week if tolerated. . Lipid-lowering medications: see documented in medical record.  2. Vitamin D deficiency Low Vitamin D level contributes to fatigue and are associated with obesity, breast, and colon cancer. She agrees to continue to take prescription Vitamin D @50 ,000 IU every week and will follow-up for routine testing of Vitamin D, at least 2-3 times per year to avoid over-replacement.  -Refill Vitamin D, Ergocalciferol, (DRISDOL) 1.25 MG (50000 UNIT) CAPS capsule; Take 1 capsule (50,000 Units total) by mouth every 7 (seven) days.  Dispense: 4 capsule; Refill: 0  3. Class 3 severe obesity with serious comorbidity and body mass index (BMI) of 45.0 to 49.9 in adult, unspecified obesity type (April [name]) April [name] is currently in the action stage of change. As such, her goal is to continue with weight loss efforts. She has agreed to keeping a food journal and adhering to recommended goals of 1100-1200 calories and 80 grams of protein daily.   Exercise goals: For substantial health benefits, adults should do at least 150 minutes (2 hours and 30 minutes) a week of moderate-intensity, or 75 minutes (1 hour and 15 minutes) a week of vigorous-intensity aerobic physical activity, or an equivalent combination of moderate- and vigorous-intensity aerobic activity. Aerobic activity should be performed in episodes of at least 10 minutes, and preferably, it should be spread throughout the week.  Behavioral modification strategies: increasing lean protein intake and increasing water intake.  April [name] has agreed to follow-up with our clinic in 3 weeks. She was informed of the importance of frequent follow-up visits to maximize her success with intensive  lifestyle modifications for her multiple health conditions.   Objective:   Blood pressure 112/71, pulse 85, temperature 99 F (37.2 C), temperature source Oral, height 5' (1.524 m), weight 241 lb (109.3 kg), SpO2 99 %. Body mass index is 47.07 kg/m.  General: Cooperative, alert, well developed, in no acute distress. HEENT: Conjunctivae and lids unremarkable. Cardiovascular: Regular rhythm.  Lungs: Normal work of breathing. Neurologic: No focal deficits.   Lab Results  Component Value Date   CREATININE 0.75 03/20/2020   BUN 19 03/20/2020   NA 138 03/20/2020   K 4.2 03/20/2020   CL 99 03/20/2020   CO2 24 03/20/2020   Lab Results  Component Value Date   ALT 11 03/20/2020   AST 16 03/20/2020   ALKPHOS 151 (H) 03/20/2020   BILITOT 0.3 03/20/2020   Lab Results  Component Value Date   HGBA1C 5.5 03/20/2020   HGBA1C 5.4 06/13/2019   HGBA1C 5.2 11/05/2017   HGBA1C 5.4 03/02/2016   HGBA1C 5.3 02/22/2015   Lab Results  Component Value Date   INSULIN 7.5 03/20/2020   INSULIN 8.6 10/24/2019   INSULIN 9.5 06/13/2019   Lab Results  Component Value Date   TSH 1.420 06/01/2018   Lab Results  Component Value Date   CHOL 233 (H) 03/20/2020   HDL 52 03/20/2020   LDLCALC 162 (H) 03/20/2020   LDLDIRECT 108 (H) 12/01/2012   TRIG 107 03/20/2020   CHOLHDL 3.4 11/05/2017   Lab Results  Component Value Date   WBC 6.4 10/24/2019   HGB 15.1 10/24/2019   HCT 45.4 10/24/2019   MCV 85 10/24/2019   PLT 354 10/24/2019   Lab Results  Component Value Date   IRON 36 (L) 12/11/2009   TIBC 318 12/11/2009   FERRITIN 19 12/11/2009   Attestation Statements:   Reviewed by clinician on day of visit: allergies, medications, problem list, medical history, surgical history, family history, social history, and previous encounter notes.    I, Water quality scientist, CMA, am acting as transcriptionist for Abby Potash, PA-C  I have reviewed the above documentation for accuracy and completeness,  and I agree with the above. Abby Potash, PA-C

## 2020-06-27 DIAGNOSIS — L80 Vitiligo: Secondary | ICD-10-CM | POA: Diagnosis not present

## 2020-07-02 DIAGNOSIS — M1712 Unilateral primary osteoarthritis, left knee: Secondary | ICD-10-CM | POA: Diagnosis not present

## 2020-07-05 ENCOUNTER — Other Ambulatory Visit: Payer: Self-pay | Admitting: Family Medicine

## 2020-07-09 DIAGNOSIS — M1712 Unilateral primary osteoarthritis, left knee: Secondary | ICD-10-CM | POA: Diagnosis not present

## 2020-07-10 ENCOUNTER — Encounter (INDEPENDENT_AMBULATORY_CARE_PROVIDER_SITE_OTHER): Payer: Self-pay | Admitting: Physician Assistant

## 2020-07-10 ENCOUNTER — Ambulatory Visit (INDEPENDENT_AMBULATORY_CARE_PROVIDER_SITE_OTHER): Payer: Medicaid Other | Admitting: Physician Assistant

## 2020-07-10 ENCOUNTER — Other Ambulatory Visit: Payer: Self-pay

## 2020-07-10 VITALS — BP 139/86 | HR 61 | Temp 97.5°F | Ht 60.0 in | Wt 242.0 lb

## 2020-07-10 DIAGNOSIS — E7849 Other hyperlipidemia: Secondary | ICD-10-CM

## 2020-07-10 DIAGNOSIS — E559 Vitamin D deficiency, unspecified: Secondary | ICD-10-CM | POA: Diagnosis not present

## 2020-07-10 DIAGNOSIS — R739 Hyperglycemia, unspecified: Secondary | ICD-10-CM | POA: Diagnosis not present

## 2020-07-10 DIAGNOSIS — Z6841 Body Mass Index (BMI) 40.0 and over, adult: Secondary | ICD-10-CM | POA: Diagnosis not present

## 2020-07-10 MED ORDER — VITAMIN D (ERGOCALCIFEROL) 1.25 MG (50000 UNIT) PO CAPS: 50000.0000 [IU] | ORAL_CAPSULE | ORAL | 0 refills | Status: DC

## 2020-07-10 NOTE — Progress Notes (Signed)
Chief Complaint:   OBESITY April [name] is here to discuss her progress with her obesity treatment plan along with follow-up of her obesity related diagnoses. [name] is on keeping a food journal and adhering to recommended goals of [phone] calories and 80 grams of protein daily and states she is following her eating plan approximately 80-85% of the time. [name] states she is walking for 30 minutes 3 times per week.  Today's visit was #: 22 Starting weight: 256 lbs Starting date: 06/13/2019 Today's weight: 242 lbs Today's date: 07/10/2020 Total lbs lost to date: 14 Total lbs lost since last in-office visit: 0  Interim History: April [name] is eating a lot more chicken recently. She states that her hunger is controlled. Her food recall is: breakfast, 2 April [name] sausage links, 2 eggs, and fruit. Lunch is chicken from [employer] or [employer]. Dinner is meat and veggies. He is not weighing her protein and is overeating her snack calories.  Subjective:   1. Hyperglycemia Roshonda is not on medications, and she reports cravings. She is due for labs.  2. Other hyperlipidemia Akemi is due for labs. She is not on medications, and she is exercising 3 times per week.  3. Vitamin D deficiency April [name] is on Vit D weekly. She is walking outside 3 times per week. She is due for labs.  Assessment/Plan:   1. Hyperglycemia Fasting labs will be obtained and results with be discussed with [name] in 2 weeks at her follow up visit. In the meanwhile [name] will continue her diet and will work on weight loss efforts.  - Hemoglobin A1c - Insulin, random  2. Other hyperlipidemia Cardiovascular risk and specific lipid/LDL goals reviewed. We discussed several lifestyle modifications today and [name] will continue to work on diet, exercise and weight loss efforts. We will check labs today. Orders and follow up as documented in patient record.   Counseling Intensive lifestyle modifications are the  first line treatment for this issue. . Dietary changes: Increase soluble fiber. Decrease simple carbohydrates. . Exercise changes: Moderate to vigorous-intensity aerobic activity 150 minutes per week if tolerated. . Lipid-lowering medications: see documented in medical record.  - Lipid panel - Comprehensive metabolic panel  3. Vitamin D deficiency Low Vitamin D level contributes to fatigue and are associated with obesity, breast, and colon cancer. We will check labs today, and we will refill prescription Vitamin D for 1 month. Kemper will follow-up for routine testing of Vitamin D, at least 2-3 times per year to avoid over-replacement.  - VITAMIN D 25 Hydroxy (Vit-D Deficiency, Fractures) - Vitamin D, Ergocalciferol, (DRISDOL) 1.25 MG (50000 UNIT) CAPS capsule; Take 1 capsule (50,000 Units total) by mouth every 7 (seven) days.  Dispense: 4 capsule; Refill: 0  4. Class 3 severe obesity with serious comorbidity and body mass index (BMI) of 45.0 to 49.9 in adult, unspecified obesity type (April) [name] is currently in the action stage of change. As such, her goal is to continue with weight loss efforts. She has agreed to the Category 2 Plan.   Exercise goals: As is.  Behavioral modification strategies: decreasing eating out and meal planning and cooking strategies.  April [name] has agreed to follow-up with our clinic in 3 weeks. She was informed of the importance of frequent follow-up visits to maximize her success with intensive lifestyle modifications for her multiple health conditions.   April [name] was informed we would discuss her lab results at her next visit unless there is a critical issue that needs to  be addressed sooner. April [name] agreed to keep her next visit at the agreed upon time to discuss these results.  Objective:   Blood pressure 139/86, pulse 61, temperature (!) 97.5 F (36.4 C), height 5' (1.524 m), weight 242 lb (109.8 kg), SpO2 99 %. Body mass index is 47.26  kg/m.  General: Cooperative, alert, well developed, in no acute distress. HEENT: Conjunctivae and lids unremarkable. Cardiovascular: Regular rhythm.  Lungs: Normal work of breathing. Neurologic: No focal deficits.   Lab Results  Component Value Date   CREATININE 0.75 03/20/2020   BUN 19 03/20/2020   NA 138 03/20/2020   K 4.2 03/20/2020   CL 99 03/20/2020   CO2 24 03/20/2020   Lab Results  Component Value Date   ALT 11 03/20/2020   AST 16 03/20/2020   ALKPHOS 151 (H) 03/20/2020   BILITOT 0.3 03/20/2020   Lab Results  Component Value Date   HGBA1C 5.5 03/20/2020   HGBA1C 5.4 06/13/2019   HGBA1C 5.2 11/05/2017   HGBA1C 5.4 03/02/2016   HGBA1C 5.3 02/22/2015   Lab Results  Component Value Date   INSULIN 7.5 03/20/2020   INSULIN 8.6 10/24/2019   INSULIN 9.5 06/13/2019   Lab Results  Component Value Date   TSH 1.420 06/01/2018   Lab Results  Component Value Date   CHOL 233 (H) 03/20/2020   HDL 52 03/20/2020   LDLCALC 162 (H) 03/20/2020   LDLDIRECT 108 (H) 12/01/2012   TRIG 107 03/20/2020   CHOLHDL 3.4 11/05/2017   Lab Results  Component Value Date   WBC 6.4 10/24/2019   HGB 15.1 10/24/2019   HCT 45.4 10/24/2019   MCV 85 10/24/2019   PLT 354 10/24/2019   Lab Results  Component Value Date   IRON 36 (L) 12/11/2009   TIBC 318 12/11/2009   FERRITIN 19 12/11/2009   Attestation Statements:   Reviewed by clinician on day of visit: allergies, medications, problem list, medical history, surgical history, family history, social history, and previous encounter notes.   Wilhemena Durie, am acting as transcriptionist for [employer], PA-C.  I have reviewed the above documentation for accuracy and completeness, and I agree with the above. Abby Potash, PA-C

## 2020-07-11 LAB — HEMOGLOBIN A1C
Est. average glucose Bld gHb Est-mCnc: 108 mg/dL
Hgb A1c MFr Bld: 5.4 % (ref 4.8–5.6)

## 2020-07-11 LAB — COMPREHENSIVE METABOLIC PANEL
ALT: 10 IU/L (ref 0–32)
AST: 17 IU/L (ref 0–40)
Albumin/Globulin Ratio: 1.3 (ref 1.2–2.2)
Albumin: 4.5 g/dL (ref 3.8–4.9)
Alkaline Phosphatase: 134 IU/L — ABNORMAL HIGH (ref 44–121)
BUN/Creatinine Ratio: 31 — ABNORMAL HIGH (ref 9–23)
BUN: 19 mg/dL (ref 6–24)
Bilirubin Total: 0.3 mg/dL (ref 0.0–1.2)
CO2: 23 mmol/L (ref 20–29)
Calcium: 10.1 mg/dL (ref 8.7–10.2)
Chloride: 97 mmol/L (ref 96–106)
Creatinine, Ser: 0.61 mg/dL (ref 0.57–1.00)
GFR calc Af Amer: 116 mL/min/{1.73_m2} (ref >59)
GFR calc non Af Amer: 101 mL/min/{1.73_m2} (ref >59)
Globulin, Total: 3.6 g/dL (ref 1.5–4.5)
Glucose: 90 mg/dL (ref 65–99)
Potassium: 4.4 mmol/L (ref 3.5–5.2)
Sodium: 134 mmol/L (ref 134–144)
Total Protein: 8.1 g/dL (ref 6.0–8.5)

## 2020-07-11 LAB — LIPID PANEL
Chol/HDL Ratio: 4.4 ratio (ref 0.0–4.4)
Cholesterol, Total: 240 mg/dL — ABNORMAL HIGH (ref 100–199)
HDL: 55 mg/dL (ref >39)
LDL Chol Calc (NIH): 172 mg/dL — ABNORMAL HIGH (ref 0–99)
Triglycerides: 78 mg/dL (ref 0–149)
VLDL Cholesterol Cal: 13 mg/dL (ref 5–40)

## 2020-07-11 LAB — VITAMIN D 25 HYDROXY (VIT D DEFICIENCY, FRACTURES): Vit D, 25-Hydroxy: 56.9 ng/mL (ref 30.0–100.0)

## 2020-07-11 LAB — INSULIN, RANDOM: INSULIN: 11.6 u[IU]/mL (ref 2.6–24.9)

## 2020-07-16 DIAGNOSIS — M1712 Unilateral primary osteoarthritis, left knee: Secondary | ICD-10-CM | POA: Diagnosis not present

## 2020-07-24 ENCOUNTER — Ambulatory Visit (INDEPENDENT_AMBULATORY_CARE_PROVIDER_SITE_OTHER): Payer: Medicaid Other | Admitting: Family Medicine

## 2020-07-26 ENCOUNTER — Other Ambulatory Visit: Payer: Self-pay | Admitting: Family Medicine

## 2020-07-26 ENCOUNTER — Other Ambulatory Visit (INDEPENDENT_AMBULATORY_CARE_PROVIDER_SITE_OTHER): Payer: Self-pay | Admitting: Physician Assistant

## 2020-07-26 DIAGNOSIS — I10 Essential (primary) hypertension: Secondary | ICD-10-CM

## 2020-07-26 DIAGNOSIS — M170 Bilateral primary osteoarthritis of knee: Secondary | ICD-10-CM

## 2020-07-29 ENCOUNTER — Other Ambulatory Visit (INDEPENDENT_AMBULATORY_CARE_PROVIDER_SITE_OTHER): Payer: Self-pay | Admitting: Physician Assistant

## 2020-07-29 DIAGNOSIS — I10 Essential (primary) hypertension: Secondary | ICD-10-CM

## 2020-07-30 ENCOUNTER — Encounter (INDEPENDENT_AMBULATORY_CARE_PROVIDER_SITE_OTHER): Payer: Self-pay

## 2020-07-30 NOTE — Telephone Encounter (Signed)
Message sent to pt.

## 2020-07-31 ENCOUNTER — Other Ambulatory Visit: Payer: Self-pay

## 2020-07-31 ENCOUNTER — Encounter (INDEPENDENT_AMBULATORY_CARE_PROVIDER_SITE_OTHER): Payer: Self-pay | Admitting: Physician Assistant

## 2020-07-31 ENCOUNTER — Ambulatory Visit (INDEPENDENT_AMBULATORY_CARE_PROVIDER_SITE_OTHER): Payer: Medicaid Other | Admitting: Physician Assistant

## 2020-07-31 VITALS — BP 115/70 | HR 66 | Temp 97.3°F | Ht 60.0 in | Wt 237.0 lb

## 2020-07-31 DIAGNOSIS — E559 Vitamin D deficiency, unspecified: Secondary | ICD-10-CM | POA: Diagnosis not present

## 2020-07-31 DIAGNOSIS — I10 Essential (primary) hypertension: Secondary | ICD-10-CM | POA: Diagnosis not present

## 2020-07-31 DIAGNOSIS — Z6841 Body Mass Index (BMI) 40.0 and over, adult: Secondary | ICD-10-CM | POA: Diagnosis not present

## 2020-07-31 MED ORDER — VITAMIN D (ERGOCALCIFEROL) 1.25 MG (50000 UNIT) PO CAPS: 50000.0000 [IU] | ORAL_CAPSULE | ORAL | 0 refills | Status: DC

## 2020-08-03 ENCOUNTER — Other Ambulatory Visit (INDEPENDENT_AMBULATORY_CARE_PROVIDER_SITE_OTHER): Payer: Self-pay | Admitting: Physician Assistant

## 2020-08-03 DIAGNOSIS — I10 Essential (primary) hypertension: Secondary | ICD-10-CM

## 2020-08-05 NOTE — Progress Notes (Signed)
Chief Complaint:   OBESITY April [name] is here to discuss her progress with her obesity treatment plan along with follow-up of her obesity related diagnoses. April [name] is on the Category 2 Plan and states she is following her eating plan approximately 85% of the time. April [name] states she is walking on occasion for exercise.   Today's visit was #: 23 Starting weight: 256 lbs Starting date: 06/13/2019 Today's weight: 237 lbs Today's date: 07/31/2020 Total lbs lost to date: 19 Total lbs lost since last in-office visit: 5  Interim History: April [name] did a great job with weight loss. She has been weighing her protein and writing down her food in a diary. She has been "monitoring" her snacks.  Subjective:   Essential hypertension. April [name] is on HCTDZ. Blood pressure is normal. No  chest pain or headache.   BP Readings from Last 3 Encounters:  07/31/20 115/70  07/10/20 139/86  06/19/20 112/71   Lab Results  Component Value Date   CREATININE 0.61 07/10/2020   CREATININE 0.75 03/20/2020   CREATININE 0.68 11/22/2019   Vitamin D deficiency. April [name] is on prescription Vitamin D weekly, which she is tolerating well.   Ref. Range 07/10/2020 10:26  Vitamin D, 25-Hydroxy Latest Ref Range: 30.0 - 100.0 ng/mL 56.9   Assessment/Plan:   Essential hypertension. April [name] is working on healthy weight loss and exercise to improve blood pressure control. We will watch for signs of hypotension as she continues her lifestyle modifications. Refill was given for HCTZ #90 with 0 refills.  Vitamin D deficiency. Low Vitamin D level contributes to fatigue and are associated with obesity, breast, and colon cancer. She was given a refill on her Vitamin D, Ergocalciferol, (DRISDOL) 1.25 MG (50000 UNIT) CAPS capsule every week #4 with 0 refills and will follow-up for routine testing of Vitamin D, at least 2-3 times per year to avoid over-replacement.   Class 3 severe obesity with serious  comorbidity and body mass index (BMI) of 45.0 to 49.9 in adult, unspecified obesity type (April [name]).  April [name] is currently in the action stage of change. As such, her goal is to continue with weight loss efforts. She has agreed to the Category 2 Plan and will journal 1100-1200 calories and 85 grams of protein daily.   Exercise goals: For substantial health benefits, adults should do at least 150 minutes (2 hours and 30 minutes) a week of moderate-intensity, or 75 minutes (1 hour and 15 minutes) a week of vigorous-intensity aerobic physical activity, or an equivalent combination of moderate- and vigorous-intensity aerobic activity. Aerobic activity should be performed in episodes of at least 10 minutes, and preferably, it should be spread throughout the week.  Behavioral modification strategies: meal planning and cooking strategies and keeping a strict food journal.  April [name] has agreed to follow-up with our clinic in 3 weeks. She was informed of the importance of frequent follow-up visits to maximize her success with intensive lifestyle modifications for her multiple health conditions.   Objective:   Blood pressure 115/70, pulse 66, temperature (!) 97.3 F (36.3 C), height 5' (1.524 m), weight 237 lb (107.5 kg), SpO2 100 %. Body mass index is 46.29 kg/m.  General: Cooperative, alert, well developed, in no acute distress. HEENT: Conjunctivae and lids unremarkable. Cardiovascular: Regular rhythm.  Lungs: Normal work of breathing. Neurologic: No focal deficits.   Lab Results  Component Value Date   CREATININE 0.61 07/10/2020   BUN 19 07/10/2020   NA 134 07/10/2020   K 4.4 07/10/2020  CL 97 07/10/2020   CO2 23 07/10/2020   Lab Results  Component Value Date   ALT 10 07/10/2020   AST 17 07/10/2020   ALKPHOS 134 (H) 07/10/2020   BILITOT 0.3 07/10/2020   Lab Results  Component Value Date   HGBA1C 5.4 07/10/2020   HGBA1C 5.5 03/20/2020   HGBA1C 5.4 06/13/2019   HGBA1C 5.2 11/05/2017     HGBA1C 5.4 03/02/2016   Lab Results  Component Value Date   INSULIN 11.6 07/10/2020   INSULIN 7.5 03/20/2020   INSULIN 8.6 10/24/2019   INSULIN 9.5 06/13/2019   Lab Results  Component Value Date   TSH 1.420 06/01/2018   Lab Results  Component Value Date   CHOL 240 (H) 07/10/2020   HDL 55 07/10/2020   LDLCALC 172 (H) 07/10/2020   LDLDIRECT 108 (H) 12/01/2012   TRIG 78 07/10/2020   CHOLHDL 4.4 07/10/2020   Lab Results  Component Value Date   WBC 6.4 10/24/2019   HGB 15.1 10/24/2019   HCT 45.4 10/24/2019   MCV 85 10/24/2019   PLT 354 10/24/2019   Lab Results  Component Value Date   IRON 36 (L) 12/11/2009   TIBC 318 12/11/2009   FERRITIN 19 12/11/2009   Attestation Statements:   Reviewed by clinician on day of visit: allergies, medications, problem list, medical history, surgical history, family history, social history, and previous encounter notes.  IMichaelene [name], am acting as transcriptionist for Abby Potash, PA-C   I have reviewed the above documentation for accuracy and completeness, and I agree with the above. Abby Potash, PA-C

## 2020-08-06 NOTE — Telephone Encounter (Signed)
[place] patient

## 2020-08-15 ENCOUNTER — Other Ambulatory Visit: Payer: Self-pay

## 2020-08-15 ENCOUNTER — Encounter: Payer: Self-pay | Admitting: Family Medicine

## 2020-08-15 ENCOUNTER — Ambulatory Visit (INDEPENDENT_AMBULATORY_CARE_PROVIDER_SITE_OTHER): Payer: Medicaid Other | Admitting: Family Medicine

## 2020-08-15 VITALS — BP 110/60 | HR 80 | Ht 60.0 in | Wt 240.0 lb

## 2020-08-15 DIAGNOSIS — Z6841 Body Mass Index (BMI) 40.0 and over, adult: Secondary | ICD-10-CM

## 2020-08-15 DIAGNOSIS — I10 Essential (primary) hypertension: Secondary | ICD-10-CM | POA: Insufficient documentation

## 2020-08-15 DIAGNOSIS — Z23 Encounter for immunization: Secondary | ICD-10-CM | POA: Diagnosis not present

## 2020-08-15 DIAGNOSIS — M170 Bilateral primary osteoarthritis of knee: Secondary | ICD-10-CM | POA: Diagnosis not present

## 2020-08-15 DIAGNOSIS — E7800 Pure hypercholesterolemia, unspecified: Secondary | ICD-10-CM | POA: Diagnosis not present

## 2020-08-15 HISTORY — DX: Essential (primary) hypertension: I10

## 2020-08-15 NOTE — Assessment & Plan Note (Signed)
-  awaiting direct LDL today, will notify patient of any abnormal results -continued to encourage lifestyle modifications -consider statin in the future, patient at very low risk of 4.4%  -continue to monitor

## 2020-08-15 NOTE — Assessment & Plan Note (Signed)
-  continue Healthy Weight [facility] Wellness program - diet and exercise counseling

## 2020-08-15 NOTE — Assessment & Plan Note (Signed)
-  continue tramadol and voltaren gel -previously discussed low evidence of efficiency of viscous injections but patient states that they help her -continue management with Dr. Noemi Chapel  -notified to come back to clinic if headaches persist or worsening to think of alternative pain management, possibly tylenol. Patient is allergic to naproxen.

## 2020-08-15 NOTE — Assessment & Plan Note (Signed)
-  Normotensive in the office today, BP of 110/60 -continue HCTZ 25 mg daily

## 2020-08-15 NOTE — Progress Notes (Signed)
    SUBJECTIVE:   CHIEF COMPLAINT / HPI:   Hypercholesteremia  Patient last lipid panel on 07/10/2020 notable for cholesterol of 240 and LDL of 172. Currently not on a statin or other lipid-lowering agents. Comes in to the office today for lab work to recheck.   PERTINENT  PMH / PSH:   Obesity  Patient actively participates in the Healthy Weight and Wellness program with last visit on 07/31/2020, has been losing weight appropriately and in a healthy manner. Lost 19 pounds since the beginning of September. Reports that she is currently on a 1200 calorie diet eating primarily eating lean meats including [place] and chicken along with vegetables. Reports exercising 3x a week when she goes walking in the park.   Osteoarthritis Patient has extensive history of left knee pain, reports that it has improved since last visit. States that the tramadol provides significant improvement. Since last visit, patient has received 3 viscous injections and gets steroid injections every 3 months, both administered by Dr. Noemi Chapel. She is able to exercise and complete all ADLs without interference. Rates her pain a 1/10 compared to 8/10 when she uses tramadol and a combination of these treatments. Reports having occasional headaches with tramadol.   Hypertension Patient compliant on HCTZ 25 mg daily. Denies chest pain and dyspnea. Patient tolerating medication well.  OBJECTIVE:   BP 110/60   Pulse 80   Ht 5' (1.524 m)   Wt 240 lb (108.9 kg)   SpO2 98%   BMI 46.87 kg/m   General: Patient well-appearing, in no acute distress HEENT: normocephalic, atraumatic, supple neck, no evidence of lymphadenopathy, non-tender thyroid CV: RRR, no murmurs or gallops auscultated  Resp: lungs clear to auscultation bilaterally, no wheezing or rhonchi noted Abdomen: soft, nontender, presence of active bowel sounds Derm: skin warm and dry to touch, no rashes or lesions noted MSK: normal passive and active ROM of knee joints  bilaterally, no erythema or crepitus  Neuro: normal gait, ambulates without limitation and assistance   ASSESSMENT/PLAN:   HYPERCHOLESTEROLEMIA -awaiting direct LDL today, will notify patient of any abnormal results -continued to encourage lifestyle modifications -consider statin in the future, patient at very low risk of 4.4%  -continue to monitor   Essential hypertension -Normotensive in the office today, BP of 110/60 -continue HCTZ 25 mg daily   Class 3 severe obesity with serious comorbidity and body mass index (BMI) of 45.0 to 49.9 in adult (HCC) -continue Healthy Weight and Wellness program - diet and exercise counseling   Osteoarthritis of knees, bilateral -continue tramadol and voltaren gel -previously discussed low evidence of efficiency of viscous injections but patient states that they help her -continue management with Dr. Noemi Chapel  -notified to come back to clinic if headaches persist or worsening to think of alternative pain management, possibly tylenol. Patient is allergic to naproxen.    Health maintenance -received influenza vaccine today  -received COVID vaccine: 1st dose (03/21/2020) and 2nd dose (04/11/2020)  -medications and PHQ-9 reviewed   Donney Dice, [place]

## 2020-08-15 NOTE — Patient Instructions (Addendum)
It was so great seeing you again!  I am glad that you are doing well! Please continue to eat healthy and exercise, it is great that you have made such wonderful progress with the Healthy Weight and Wellness program! You also received your flu vaccine today! We also got lipid labs today, I will contact you with any abnormal results.   Please follow up in 3-6 months, if anything arises between now and then please don't hesitate to contact our office. Thank you for allowing [place] to be a part of your medical care!

## 2020-08-16 ENCOUNTER — Other Ambulatory Visit: Payer: Medicaid Other

## 2020-08-16 DIAGNOSIS — E7800 Pure hypercholesterolemia, unspecified: Secondary | ICD-10-CM | POA: Diagnosis not present

## 2020-08-17 LAB — LDL CHOLESTEROL, DIRECT: LDL Direct: 153 mg/dL — ABNORMAL HIGH (ref 0–99)

## 2020-08-19 ENCOUNTER — Other Ambulatory Visit: Payer: Self-pay

## 2020-08-19 ENCOUNTER — Ambulatory Visit (INDEPENDENT_AMBULATORY_CARE_PROVIDER_SITE_OTHER): Payer: Medicaid Other | Admitting: Physician Assistant

## 2020-08-19 ENCOUNTER — Encounter (INDEPENDENT_AMBULATORY_CARE_PROVIDER_SITE_OTHER): Payer: Self-pay | Admitting: Physician Assistant

## 2020-08-19 VITALS — BP 102/68 | HR 82 | Temp 98.4°F | Ht 60.0 in | Wt 234.0 lb

## 2020-08-19 DIAGNOSIS — Z6841 Body Mass Index (BMI) 40.0 and over, adult: Secondary | ICD-10-CM | POA: Diagnosis not present

## 2020-08-19 DIAGNOSIS — E559 Vitamin D deficiency, unspecified: Secondary | ICD-10-CM

## 2020-08-19 DIAGNOSIS — E7849 Other hyperlipidemia: Secondary | ICD-10-CM | POA: Diagnosis not present

## 2020-08-19 MED ORDER — VITAMIN D (ERGOCALCIFEROL) 1.25 MG (50000 UNIT) PO CAPS: 50000.0000 [IU] | ORAL_CAPSULE | ORAL | 0 refills | Status: DC

## 2020-08-19 NOTE — Progress Notes (Signed)
Chief Complaint:   OBESITY April [name] is here to discuss her progress with her obesity treatment plan along with follow-up of her obesity related diagnoses. April [name] is on the Category 2 Plan and states she is following her eating plan approximately 85% of the time. April [name] states she is doing in home exercises 20-25 minutes 3-4 times per week.  Today's visit was #: 24 Starting weight: 256 lbs Starting date: 06/13/2019 Today's weight: 234 lbs Today's date: 08/19/2020 Total lbs lost to date: 22 Total lbs lost since last in-office visit: 3  Interim History: April [name] continues to journal her food, but is not tracking protein or calories. Her hunger is under control and energy is good.  Subjective:   Vitamin D deficiency. April [name] is on prescription Vitamin D weekly. No nausea, vomiting, or muscle weakness. No outdoor activities. Energy is good.   Ref. Range 07/10/2020 10:26  Vitamin D, 25-Hydroxy Latest Ref Range: 30.0 - 100.0 ng/mL 56.9   Other hyperlipidemia with hypertension. April [name] is on no medication for elevated LDL. She recently followed up with her PCP for review of cholesterol and no changes were made.   Lab Results  Component Value Date   CHOL 240 (H) 07/10/2020   HDL 55 07/10/2020   LDLCALC 172 (H) 07/10/2020   LDLDIRECT 153 (H) 08/16/2020   TRIG 78 07/10/2020   CHOLHDL 4.4 07/10/2020   Lab Results  Component Value Date   ALT 10 07/10/2020   AST 17 07/10/2020   ALKPHOS 134 (H) 07/10/2020   BILITOT 0.3 07/10/2020   The 10-year ASCVD risk score Mikey Bussing DC Jr., April al., April [name]) is: 3.5%   Values used to calculate the score:     Age: 59 years     Sex: Female     Is Non-Hispanic African American: Yes     Diabetic: No     Tobacco smoker: No     Systolic Blood Pressure: 270 mmHg     Is BP treated: Yes     HDL Cholesterol: 55 mg/dL     Total Cholesterol: 240 mg/dL  Assessment/Plan:   Vitamin D deficiency. Low Vitamin D level contributes to fatigue  and are associated with obesity, breast, and colon cancer. She was given a refill on her Vitamin D, Ergocalciferol, (DRISDOL) 1.25 MG (50000 UNIT) CAPS capsule every week #4 with 0 refills and will follow-up for routine testing of Vitamin D, at least 2-3 times per year to avoid over-replacement.   Other hyperlipidemia with hypertension. Cardiovascular risk and specific lipid/LDL goals reviewed.  We discussed several lifestyle modifications today and April [name] will continue to work on diet, exercise and weight loss efforts. Orders and follow up as documented in patient record. April [name] will continue the meal plan and will follow-up with her PCP as scheduled.  Counseling Intensive lifestyle modifications are the first line treatment for this issue. . Dietary changes: Increase soluble fiber. Decrease simple carbohydrates. . Exercise changes: Moderate to vigorous-intensity aerobic activity 150 minutes per week if tolerated. . Lipid-lowering medications: see documented in medical record.  Class 3 severe obesity with serious comorbidity and body mass index (BMI) of 45.0 to 49.9 in adult, unspecified obesity type (April [name]).  April [name] is currently in the action stage of change. As such, her goal is to continue with weight loss efforts. She has agreed to keeping a food journal and adhering to recommended goals of 1200 calories and 85 grams of protein daily.   Exercise goals: For substantial health benefits, adults should do  at least 150 minutes (2 hours and 30 minutes) a week of moderate-intensity, or 75 minutes (1 hour and 15 minutes) a week of vigorous-intensity aerobic physical activity, or an equivalent combination of moderate- and vigorous-intensity aerobic activity. Aerobic activity should be performed in episodes of at least 10 minutes, and preferably, it should be spread throughout the week.  Behavioral modification strategies: holiday eating strategies  and keeping a strict food journal.  April [name] has  agreed to follow-up with our clinic in 2 weeks. She was informed of the importance of frequent follow-up visits to maximize her success with intensive lifestyle modifications for her multiple health conditions.   Objective:   Blood pressure 102/68, pulse 82, temperature 98.4 F (36.9 C), height 5' (1.524 m), weight 234 lb (106.1 kg), SpO2 96 %. Body mass index is 45.7 kg/m.  General: Cooperative, alert, well developed, in no acute distress. HEENT: Conjunctivae and lids unremarkable. Cardiovascular: Regular rhythm.  Lungs: Normal work of breathing. Neurologic: No focal deficits.   Lab Results  Component Value Date   CREATININE 0.61 07/10/2020   BUN 19 07/10/2020   NA 134 07/10/2020   K 4.4 07/10/2020   CL 97 07/10/2020   CO2 23 07/10/2020   Lab Results  Component Value Date   ALT 10 07/10/2020   AST 17 07/10/2020   ALKPHOS 134 (H) 07/10/2020   BILITOT 0.3 07/10/2020   Lab Results  Component Value Date   HGBA1C 5.4 07/10/2020   HGBA1C 5.5 03/20/2020   HGBA1C 5.4 06/13/2019   HGBA1C 5.2 11/05/2017   HGBA1C 5.4 03/02/2016   Lab Results  Component Value Date   INSULIN 11.6 07/10/2020   INSULIN 7.5 03/20/2020   INSULIN 8.6 10/24/2019   INSULIN 9.5 06/13/2019   Lab Results  Component Value Date   TSH 1.420 06/01/2018   Lab Results  Component Value Date   CHOL 240 (H) 07/10/2020   HDL 55 07/10/2020   LDLCALC 172 (H) 07/10/2020   LDLDIRECT 153 (H) 08/16/2020   TRIG 78 07/10/2020   CHOLHDL 4.4 07/10/2020   Lab Results  Component Value Date   WBC 6.4 10/24/2019   HGB 15.1 10/24/2019   HCT 45.4 10/24/2019   MCV 85 10/24/2019   PLT 354 10/24/2019   Lab Results  Component Value Date   IRON 36 (L) 12/11/2009   TIBC 318 12/11/2009   FERRITIN 19 12/11/2009   Attestation Statements:   Reviewed by clinician on day of visit: allergies, medications, problem list, medical history, surgical history, family history, social history, and previous encounter  notes.  IMichaelene Song, am acting as transcriptionist for Abby Potash, PA-C   I have reviewed the above documentation for accuracy and completeness, and I agree with the above. Abby Potash, PA-C

## 2020-09-02 ENCOUNTER — Other Ambulatory Visit: Payer: Self-pay

## 2020-09-02 ENCOUNTER — Encounter (INDEPENDENT_AMBULATORY_CARE_PROVIDER_SITE_OTHER): Payer: Self-pay | Admitting: Physician Assistant

## 2020-09-02 ENCOUNTER — Ambulatory Visit (INDEPENDENT_AMBULATORY_CARE_PROVIDER_SITE_OTHER): Payer: Medicaid Other | Admitting: Physician Assistant

## 2020-09-02 VITALS — BP 95/66 | HR 70 | Temp 98.4°F | Ht 60.0 in | Wt 232.0 lb

## 2020-09-02 DIAGNOSIS — I10 Essential (primary) hypertension: Secondary | ICD-10-CM

## 2020-09-02 DIAGNOSIS — Z6841 Body Mass Index (BMI) 40.0 and over, adult: Secondary | ICD-10-CM | POA: Diagnosis not present

## 2020-09-02 DIAGNOSIS — E8881 Metabolic syndrome: Secondary | ICD-10-CM | POA: Diagnosis not present

## 2020-09-03 NOTE — Progress Notes (Signed)
Chief Complaint:   OBESITY April [name] is here to discuss her progress with her obesity treatment plan along with follow-up of her obesity related diagnoses. April [name] is on the Category 2 Plan and states she is following her eating plan approximately 80% of the time. April [name] states she is walking 30 minutes 2-3 times per week.  Today's visit was #: 25 Starting weight: 256 lbs Starting date: 06/13/2019 Today's weight: 232 lbs Today's date: 09/02/2020 Total lbs lost to date: 24 Total lbs lost since last in-office visit: 2  Interim History: April [name] continues to do a good job with weight loss. She is journaling her calories, but not her protein. Some days she is short on calories because she is not hungry.  Subjective:   Essential hypertension. April [name] is on HCTZ. Blood pressure is controlled. She is followed by her PCP. She is exercising regularly.  BP Readings from Last 3 Encounters:  09/02/20 95/66  08/19/20 102/68  08/15/20 110/60   Lab Results  Component Value Date   CREATININE 0.61 07/10/2020   CREATININE 0.75 03/20/2020   CREATININE 0.68 11/22/2019   Insulin resistance. April [name] has a diagnosis of insulin resistance based on her elevated fasting insulin level >5. She continues to work on diet and exercise to decrease her risk of diabetes. April [name] is on no medication and denies polyphagia. She is exercising regularly. Insulin level is not at goal.  Lab Results  Component Value Date   INSULIN 11.6 07/10/2020   INSULIN 7.5 03/20/2020   INSULIN 8.6 10/24/2019   INSULIN 9.5 06/13/2019   Lab Results  Component Value Date   HGBA1C 5.4 07/10/2020   Assessment/Plan:   Essential hypertension. April [name] is working on healthy weight loss and exercise to improve blood pressure control. We will watch for signs of hypotension as she continues her lifestyle modifications. Refill was given for hydrochlorothiazide (HYDRODIURIL) 25 MG tablet #30 with 0 refills. She  will follow-up with her PCP as scheduled.  Insulin resistance. April [name] will continue to work on weight loss, exercise, and decreasing simple carbohydrates to help decrease the risk of diabetes. April [name] agreed to follow-up with [place] as directed to closely monitor her progress.  Class 3 severe obesity with serious comorbidity and body mass index (BMI) of 45.0 to 49.9 in adult, unspecified obesity type ([place]).  April [name] is currently in the action stage of change. As such, her goal is to continue with weight loss efforts. She has agreed to keeping a food journal and adhering to recommended goals of 1000-1200 calories and 80 grams of protein daily.   Exercise goals: For substantial health benefits, adults should do at least 150 minutes (2 hours and 30 minutes) a week of moderate-intensity, or 75 minutes (1 hour and 15 minutes) a week of vigorous-intensity aerobic physical activity, or an equivalent combination of moderate- and vigorous-intensity aerobic activity. Aerobic activity should be performed in episodes of at least 10 minutes, and preferably, it should be spread throughout the week.  Behavioral modification strategies: meal planning and cooking strategies and keeping healthy foods in the home.  April [name] has agreed to follow-up with our clinic in 3 weeks. She was informed of the importance of frequent follow-up visits to maximize her success with intensive lifestyle modifications for her multiple health conditions.   Objective:   Blood pressure 95/66, pulse 70, temperature 98.4 F (36.9 C), height 5' (1.524 m), weight 232 lb (105.2 kg), SpO2 96 %. Body mass index is 45.31 kg/m.  General: Cooperative, alert, well  developed, in no acute distress. HEENT: Conjunctivae and lids unremarkable. Cardiovascular: Regular rhythm.  Lungs: Normal work of breathing. Neurologic: No focal deficits.   Lab Results  Component Value Date   CREATININE 0.61 07/10/2020   BUN 19 07/10/2020   NA 134 07/10/2020    K 4.4 07/10/2020   CL 97 07/10/2020   CO2 23 07/10/2020   Lab Results  Component Value Date   ALT 10 07/10/2020   AST 17 07/10/2020   ALKPHOS 134 (H) 07/10/2020   BILITOT 0.3 07/10/2020   Lab Results  Component Value Date   HGBA1C 5.4 07/10/2020   HGBA1C 5.5 03/20/2020   HGBA1C 5.4 06/13/2019   HGBA1C 5.2 11/05/2017   HGBA1C 5.4 03/02/2016   Lab Results  Component Value Date   INSULIN 11.6 07/10/2020   INSULIN 7.5 03/20/2020   INSULIN 8.6 10/24/2019   INSULIN 9.5 06/13/2019   Lab Results  Component Value Date   TSH 1.420 06/01/2018   Lab Results  Component Value Date   CHOL 240 (H) 07/10/2020   HDL 55 07/10/2020   LDLCALC 172 (H) 07/10/2020   LDLDIRECT 153 (H) 08/16/2020   TRIG 78 07/10/2020   CHOLHDL 4.4 07/10/2020   Lab Results  Component Value Date   WBC 6.4 10/24/2019   HGB 15.1 10/24/2019   HCT 45.4 10/24/2019   MCV 85 10/24/2019   PLT 354 10/24/2019   Lab Results  Component Value Date   IRON 36 (L) 12/11/2009   TIBC 318 12/11/2009   FERRITIN 19 12/11/2009   Attestation Statements:   Reviewed by clinician on day of visit: allergies, medications, problem list, medical history, surgical history, family history, social history, and previous encounter notes.  IMichaelene Song, am acting as transcriptionist for Abby Potash, PA-C   I have reviewed the above documentation for accuracy and completeness, and I agree with the above. Abby Potash, PA-C

## 2020-09-06 ENCOUNTER — Other Ambulatory Visit (INDEPENDENT_AMBULATORY_CARE_PROVIDER_SITE_OTHER): Payer: Self-pay | Admitting: Physician Assistant

## 2020-09-06 DIAGNOSIS — I10 Essential (primary) hypertension: Secondary | ICD-10-CM

## 2020-09-08 MED ORDER — HYDROCHLOROTHIAZIDE 25 MG PO TABS: ORAL_TABLET | ORAL | 0 refills | Status: DC

## 2020-09-09 NOTE — Telephone Encounter (Signed)
Last OV with April [name] 

## 2020-09-10 ENCOUNTER — Other Ambulatory Visit: Payer: Self-pay | Admitting: Family Medicine

## 2020-09-18 ENCOUNTER — Encounter (INDEPENDENT_AMBULATORY_CARE_PROVIDER_SITE_OTHER): Payer: Self-pay

## 2020-09-18 ENCOUNTER — Other Ambulatory Visit (INDEPENDENT_AMBULATORY_CARE_PROVIDER_SITE_OTHER): Payer: Self-pay | Admitting: Physician Assistant

## 2020-09-18 DIAGNOSIS — M1712 Unilateral primary osteoarthritis, left knee: Secondary | ICD-10-CM | POA: Diagnosis not present

## 2020-09-18 DIAGNOSIS — E559 Vitamin D deficiency, unspecified: Secondary | ICD-10-CM

## 2020-09-18 NOTE — Telephone Encounter (Signed)
This patient was last seen by Tracey Aguilar, PA-C, and currently has an upcoming appt scheduled on 09/24/20 with her.  

## 2020-09-18 NOTE — Telephone Encounter (Signed)
Sent patient a message.

## 2020-09-19 NOTE — Telephone Encounter (Signed)
Called patient this morning at 8:48 am. Her mailbox is full was unable to leave a message.

## 2020-09-24 ENCOUNTER — Ambulatory Visit (INDEPENDENT_AMBULATORY_CARE_PROVIDER_SITE_OTHER): Payer: Medicaid Other | Admitting: Physician Assistant

## 2020-09-24 ENCOUNTER — Other Ambulatory Visit: Payer: Self-pay

## 2020-09-24 ENCOUNTER — Encounter (INDEPENDENT_AMBULATORY_CARE_PROVIDER_SITE_OTHER): Payer: Self-pay | Admitting: Physician Assistant

## 2020-09-24 VITALS — BP 96/67 | HR 64 | Temp 98.3°F | Ht 60.0 in | Wt 230.0 lb

## 2020-09-24 DIAGNOSIS — E7849 Other hyperlipidemia: Secondary | ICD-10-CM

## 2020-09-24 DIAGNOSIS — Z6841 Body Mass Index (BMI) 40.0 and over, adult: Secondary | ICD-10-CM | POA: Diagnosis not present

## 2020-09-24 DIAGNOSIS — E559 Vitamin D deficiency, unspecified: Secondary | ICD-10-CM

## 2020-09-24 MED ORDER — VITAMIN D (ERGOCALCIFEROL) 1.25 MG (50000 UNIT) PO CAPS: 50000.0000 [IU] | ORAL_CAPSULE | ORAL | 0 refills | Status: DC

## 2020-09-25 NOTE — Progress Notes (Signed)
Chief Complaint:   OBESITY April [name] is here to discuss her progress with her obesity treatment plan along with follow-up of her obesity related diagnoses. April [name] is on the Category 2 Plan and states she is following her eating plan approximately 90% of the time. April [name] states she is walking 30 minutes 3 times per week.  Today's visit was #: 25 Starting weight: 256 lbs Starting date: 06/13/2019 Today's weight: 230 lbs Today's date: 09/24/2020 Total lbs lost to date: 26 Total lbs lost since last in-office visit: 2  Interim History: April [name] is doing a good job tracking her protein and calories daily. She is not always hitting her goals daily due to lack of hunger. She is in the process of moving.  Subjective:   Vitamin D deficiency. April [name] is on prescription Vitamin D weekly. No nausea, vomiting, or muscle weakness.    Ref. Range 07/10/2020 10:26  Vitamin D, 25-Hydroxy Latest Ref Range: 30.0 - 100.0 ng/mL 56.9   Other hyperlipidemia. April [name] is on no medications. Last lipid panel was not at goal. She is exercising 3 days weekly.   Lab Results  Component Value Date   CHOL 240 (H) 07/10/2020   HDL 55 07/10/2020   LDLCALC 172 (H) 07/10/2020   LDLDIRECT 153 (H) 08/16/2020   TRIG 78 07/10/2020   CHOLHDL 4.4 07/10/2020   Lab Results  Component Value Date   ALT 10 07/10/2020   AST 17 07/10/2020   ALKPHOS 134 (H) 07/10/2020   BILITOT 0.3 07/10/2020   The 10-year ASCVD risk score Mikey Bussing DC Jr., et al., 2013) is: 2.9%   Values used to calculate the score:     Age: 59 years     Sex: Female     Is Non-Hispanic African American: Yes     Diabetic: No     Tobacco smoker: No     Systolic Blood Pressure: 96 mmHg     Is BP treated: Yes     HDL Cholesterol: 55 mg/dL     Total Cholesterol: 240 mg/dL  Assessment/Plan:   Vitamin D deficiency. Low Vitamin D level contributes to fatigue and are associated with obesity, breast, and colon cancer. She was given a  refill on her Vitamin D, Ergocalciferol, (DRISDOL) 1.25 MG (50000 UNIT) CAPS capsule every week #4 with 0 refills and will follow-up for routine testing of Vitamin D, at least 2-3 times per year to avoid over-replacement.   Other hyperlipidemia. Cardiovascular risk and specific lipid/LDL goals reviewed.  We discussed several lifestyle modifications today and April [name] will continue to work on diet, exercise and weight loss efforts. Orders and follow up as documented in patient record.   Counseling Intensive lifestyle modifications are the first line treatment for this issue. . Dietary changes: Increase soluble fiber. Decrease simple carbohydrates. . Exercise changes: Moderate to vigorous-intensity aerobic activity 150 minutes per week if tolerated. . Lipid-lowering medications: see documented in medical record.  Class 3 severe obesity with serious comorbidity and body mass index (BMI) of 45.0 to 49.9 in adult, unspecified obesity type ([place]).  April [name] is currently in the action stage of change. As such, her goal is to continue with weight loss efforts. She has agreed to keeping a food journal and adhering to recommended goals of 1000-1200 calories and 75 grams of protein daily.   Exercise goals: For substantial health benefits, adults should do at least 150 minutes (2 hours and 30 minutes) a week of moderate-intensity, or 75 minutes (1 hour and 15 minutes)  a week of vigorous-intensity aerobic physical activity, or an equivalent combination of moderate- and vigorous-intensity aerobic activity. Aerobic activity should be performed in episodes of at least 10 minutes, and preferably, it should be spread throughout the week.  Behavioral modification strategies: increasing lean protein intake and meal planning and cooking strategies.  April [name] has agreed to follow-up with our clinic in 3 weeks. She was informed of the importance of frequent follow-up visits to maximize her success with intensive lifestyle  modifications for her multiple health conditions.   Objective:   Blood pressure 96/67, pulse 64, temperature 98.3 F (36.8 C), height 5' (1.524 m), weight 230 lb (104.3 kg), SpO2 100 %. Body mass index is 44.92 kg/m.  General: Cooperative, alert, well developed, in no acute distress. HEENT: Conjunctivae and lids unremarkable. Cardiovascular: Regular rhythm.  Lungs: Normal work of breathing. Neurologic: No focal deficits.   Lab Results  Component Value Date   CREATININE 0.61 07/10/2020   BUN 19 07/10/2020   NA 134 07/10/2020   K 4.4 07/10/2020   CL 97 07/10/2020   CO2 23 07/10/2020   Lab Results  Component Value Date   ALT 10 07/10/2020   AST 17 07/10/2020   ALKPHOS 134 (H) 07/10/2020   BILITOT 0.3 07/10/2020   Lab Results  Component Value Date   HGBA1C 5.4 07/10/2020   HGBA1C 5.5 03/20/2020   HGBA1C 5.4 06/13/2019   HGBA1C 5.2 11/05/2017   HGBA1C 5.4 03/02/2016   Lab Results  Component Value Date   INSULIN 11.6 07/10/2020   INSULIN 7.5 03/20/2020   INSULIN 8.6 10/24/2019   INSULIN 9.5 06/13/2019   Lab Results  Component Value Date   TSH 1.420 06/01/2018   Lab Results  Component Value Date   CHOL 240 (H) 07/10/2020   HDL 55 07/10/2020   LDLCALC 172 (H) 07/10/2020   LDLDIRECT 153 (H) 08/16/2020   TRIG 78 07/10/2020   CHOLHDL 4.4 07/10/2020   Lab Results  Component Value Date   WBC 6.4 10/24/2019   HGB 15.1 10/24/2019   HCT 45.4 10/24/2019   MCV 85 10/24/2019   PLT 354 10/24/2019   Lab Results  Component Value Date   IRON 36 (L) 12/11/2009   TIBC 318 12/11/2009   FERRITIN 19 12/11/2009   Attestation Statements:   Reviewed by clinician on day of visit: allergies, medications, problem list, medical history, surgical history, family history, social history, and previous encounter notes.  IMichaelene Song, am acting as transcriptionist for Abby Potash, PA-C   I have reviewed the above documentation for accuracy and completeness, and I agree  with the above. Abby Potash, PA-C

## 2020-10-16 ENCOUNTER — Ambulatory Visit (INDEPENDENT_AMBULATORY_CARE_PROVIDER_SITE_OTHER): Payer: Medicaid Other | Admitting: Physician Assistant

## 2020-10-16 ENCOUNTER — Encounter (INDEPENDENT_AMBULATORY_CARE_PROVIDER_SITE_OTHER): Payer: Self-pay | Admitting: Physician Assistant

## 2020-10-16 ENCOUNTER — Other Ambulatory Visit: Payer: Self-pay

## 2020-10-16 VITALS — BP 107/74 | HR 72 | Temp 98.3°F | Ht 60.0 in | Wt 229.0 lb

## 2020-10-16 DIAGNOSIS — E559 Vitamin D deficiency, unspecified: Secondary | ICD-10-CM | POA: Diagnosis not present

## 2020-10-16 DIAGNOSIS — I10 Essential (primary) hypertension: Secondary | ICD-10-CM

## 2020-10-16 DIAGNOSIS — Z6841 Body Mass Index (BMI) 40.0 and over, adult: Secondary | ICD-10-CM

## 2020-10-17 NOTE — Progress Notes (Signed)
Chief Complaint:   OBESITY April [name] is here to discuss her progress with her obesity treatment plan along with follow-up of her obesity related diagnoses. April [name] is on keeping a food journal and adhering to recommended goals of 1000-1200 calories and 75 grams of protein daily and states she is following her eating plan approximately 85% of the time. April [name] states she is walking for 30 minutes 3 times per week.  Today's visit was #: 27 Starting weight: 256 lbs Starting date: 06/13/2019 Today's weight: 229 lbs Today's date: 10/16/2020 Total lbs lost to date: 27 Total lbs lost since last in-office visit: 1  Interim History: April [name] did very well with portion control over the holidays. She is journaling her food regularly. There are some days she does not meet her protein or calorie goals.  Subjective:   1. Vitamin D deficiency April [name] is on Vit D weekly. Her last Vit D level was at goal.  2. Essential hypertension April [name]'s blood pressure is normal, and she denies dizziness or chest pain. She is managed by her primary care physician.  Assessment/Plan:   1. Vitamin D deficiency Low Vitamin D level contributes to fatigue and are associated with obesity, breast, and colon cancer. We will refill prescription Vitamin D for 1 month, and we will recheck labs at her next visit. April [name] will follow-up for routine testing of Vitamin D, at least 2-3 times per year to avoid over-replacement.  2. Essential hypertension Jaquesha is working on healthy weight loss and exercise to improve blood pressure control. We will watch for signs of hypotension as she continues her lifestyle modifications. We will refill hydrochlorothiazide for 1 month.  3. Class 3 severe obesity with serious comorbidity and body mass index (BMI) of 45.0 to 49.9 in adult, unspecified obesity type ([facility]) April [name] is currently in the action stage of change. As such, her goal is to continue with weight loss efforts. She has  agreed to the Category 2 Plan or keeping a food journal and adhering to recommended goals of 1000-1100 calories and 80 grams of protein daily.   We will recheck fasting labs at her next visit.  Exercise goals: As is.   Behavioral modification strategies: increasing lean protein intake and increasing water intake.  April [name] has agreed to follow-up with our clinic in 3 weeks. She was informed of the importance of frequent follow-up visits to maximize her success with intensive lifestyle modifications for her multiple health conditions.   Objective:   Blood pressure 107/74, pulse 72, temperature 98.3 F (36.8 C), height 5' (1.524 m), weight 229 lb (103.9 kg), SpO2 98 %. Body mass index is 44.72 kg/m.  General: Cooperative, alert, well developed, in no acute distress. HEENT: Conjunctivae and lids unremarkable. Cardiovascular: Regular rhythm.  Lungs: Normal work of breathing. Neurologic: No focal deficits.   Lab Results  Component Value Date   CREATININE 0.61 07/10/2020   BUN 19 07/10/2020   NA 134 07/10/2020   K 4.4 07/10/2020   CL 97 07/10/2020   CO2 23 07/10/2020   Lab Results  Component Value Date   ALT 10 07/10/2020   AST 17 07/10/2020   ALKPHOS 134 (H) 07/10/2020   BILITOT 0.3 07/10/2020   Lab Results  Component Value Date   HGBA1C 5.4 07/10/2020   HGBA1C 5.5 03/20/2020   HGBA1C 5.4 06/13/2019   HGBA1C 5.2 11/05/2017   HGBA1C 5.4 03/02/2016   Lab Results  Component Value Date   INSULIN 11.6 07/10/2020   INSULIN 7.5 03/20/2020  INSULIN 8.6 10/24/2019   INSULIN 9.5 06/13/2019   Lab Results  Component Value Date   TSH 1.420 06/01/2018   Lab Results  Component Value Date   CHOL 240 (H) 07/10/2020   HDL 55 07/10/2020   LDLCALC 172 (H) 07/10/2020   LDLDIRECT 153 (H) 08/16/2020   TRIG 78 07/10/2020   CHOLHDL 4.4 07/10/2020   Lab Results  Component Value Date   WBC 6.4 10/24/2019   HGB 15.1 10/24/2019   HCT 45.4 10/24/2019   MCV 85 10/24/2019   PLT  354 10/24/2019   Lab Results  Component Value Date   IRON 36 (L) 12/11/2009   TIBC 318 12/11/2009   FERRITIN 19 12/11/2009   Attestation Statements:   Reviewed by clinician on day of visit: allergies, medications, problem list, medical history, surgical history, family history, social history, and previous encounter notes.   Wilhemena Durie, am acting as transcriptionist for [employer], PA-C.  I have reviewed the above documentation for accuracy and completeness, and I agree with the above. Abby Potash, PA-C

## 2020-10-18 ENCOUNTER — Other Ambulatory Visit (INDEPENDENT_AMBULATORY_CARE_PROVIDER_SITE_OTHER): Payer: Self-pay | Admitting: Physician Assistant

## 2020-10-18 DIAGNOSIS — I10 Essential (primary) hypertension: Secondary | ICD-10-CM

## 2020-10-21 MED ORDER — VITAMIN D (ERGOCALCIFEROL) 1.25 MG (50000 UNIT) PO CAPS: 50000.0000 [IU] | ORAL_CAPSULE | ORAL | 0 refills | Status: DC

## 2020-10-21 MED ORDER — HYDROCHLOROTHIAZIDE 25 MG PO TABS: ORAL_TABLET | ORAL | 0 refills | Status: DC

## 2020-10-21 NOTE — Telephone Encounter (Signed)
No need to reorder. She should have. Thanks

## 2020-10-25 ENCOUNTER — Other Ambulatory Visit: Payer: Self-pay | Admitting: Family Medicine

## 2020-10-25 DIAGNOSIS — M170 Bilateral primary osteoarthritis of knee: Secondary | ICD-10-CM

## 2020-10-31 DIAGNOSIS — L80 Vitiligo: Secondary | ICD-10-CM | POA: Diagnosis not present

## 2020-11-06 ENCOUNTER — Encounter (INDEPENDENT_AMBULATORY_CARE_PROVIDER_SITE_OTHER): Payer: Self-pay | Admitting: Physician Assistant

## 2020-11-06 ENCOUNTER — Other Ambulatory Visit: Payer: Self-pay

## 2020-11-06 ENCOUNTER — Ambulatory Visit (INDEPENDENT_AMBULATORY_CARE_PROVIDER_SITE_OTHER): Payer: Medicaid Other | Admitting: Physician Assistant

## 2020-11-06 VITALS — BP 109/72 | HR 67 | Temp 98.2°F | Ht 60.0 in | Wt 228.0 lb

## 2020-11-06 DIAGNOSIS — E559 Vitamin D deficiency, unspecified: Secondary | ICD-10-CM | POA: Diagnosis not present

## 2020-11-06 DIAGNOSIS — I10 Essential (primary) hypertension: Secondary | ICD-10-CM | POA: Diagnosis not present

## 2020-11-06 DIAGNOSIS — E7849 Other hyperlipidemia: Secondary | ICD-10-CM | POA: Diagnosis not present

## 2020-11-06 DIAGNOSIS — R739 Hyperglycemia, unspecified: Secondary | ICD-10-CM

## 2020-11-06 DIAGNOSIS — Z6841 Body Mass Index (BMI) 40.0 and over, adult: Secondary | ICD-10-CM | POA: Diagnosis not present

## 2020-11-06 MED ORDER — VITAMIN D (ERGOCALCIFEROL) 1.25 MG (50000 UNIT) PO CAPS: 50000.0000 [IU] | ORAL_CAPSULE | ORAL | 0 refills | Status: DC

## 2020-11-06 MED ORDER — HYDROCHLOROTHIAZIDE 25 MG PO TABS: ORAL_TABLET | ORAL | 0 refills | Status: DC

## 2020-11-06 NOTE — Progress Notes (Unsigned)
Chief Complaint:   OBESITY April [name] is here to discuss her progress with her obesity treatment plan along with follow-up of her obesity related diagnoses. April [name] is on the Category 2 Plan or keeping a food journal and adhering to recommended goals of 1000-1100 calories and 80 grams of protein daily and states she is following her eating plan approximately 80% of the time. April [name] states she is doing 0 minutes 0 times per week.  Today's visit was #: 28 Starting weight: 256 lbs Starting date: 06/13/2019 Today's weight: 228 lbs Today's date: 11/06/2020 Total lbs lost to date: 28 Total lbs lost since last in-office visit: 1  Interim History: April [name] is journaling daily and she did not hit her protein goal 11 out of the last 21 days. She is getting married soon and moving, so she is motivated to continue with her plan.  Subjective:   1. Vitamin D deficiency April [name] is on Vit D weekly. Last Vit D level was 56.9. Her energy level is good.  2. Other hyperlipidemia April [name] is not on medications, and she denies chest pain. She is not exercising, and she is due for labs.  3. Essential hypertension April [name] is on hydrochlorothiazide, and her blood pressure is well controlled. She denies headache or chest pain.  4. Hyperglycemia April [name]'s last A1c was 5.4. She denies polyphagia. She is not on medications, and she is due for labs.  Assessment/Plan:   1. Vitamin D deficiency Low Vitamin D level contributes to fatigue and are associated with obesity, breast, and colon cancer. We will check labs today, and we will refill prescription Vitamin D for 1 month. April [name] will follow-up for routine testing of Vitamin D, at least 2-3 times per year to avoid over-replacement.  - VITAMIN D 25 Hydroxy (Vit-D Deficiency, Fractures) - Vitamin D, Ergocalciferol, (DRISDOL) 1.25 MG (50000 UNIT) CAPS capsule; Take 1 capsule (50,000 Units total) by mouth every 7 (seven) days.  Dispense: 4 capsule;  Refill: 0  2. Other hyperlipidemia Cardiovascular risk and specific lipid/LDL goals reviewed. We discussed several lifestyle modifications today. We will check labs today. April [name] will continue her meal plan, and will continue to work on exercise and weight loss efforts. Orders and follow up as documented in patient record.   Counseling Intensive lifestyle modifications are the first line treatment for this issue. . Dietary changes: Increase soluble fiber. Decrease simple carbohydrates. . Exercise changes: Moderate to vigorous-intensity aerobic activity 150 minutes per week if tolerated. . Lipid-lowering medications: see documented in medical record.  - Lipid panel  3. Essential hypertension April [name] is working on healthy weight loss and exercise to improve blood pressure control. We will watch for signs of hypotension as she continues her lifestyle modifications. We will check labs today, and we will refill hydrochlorothiazide for 90 days with no refills.  - Comprehensive metabolic panel - hydrochlorothiazide (HYDRODIURIL) 25 MG tablet; TAKE 1 TABLET(25 MG) BY MOUTH DAILY  Dispense: 90 tablet; Refill: 0  4. Hyperglycemia Fasting labs will be obtained today, and results with be discussed with April [name] in 2 weeks at her follow up visit. In the meanwhile April [name] will continue her meal plan and will work on weight loss efforts.  - Hemoglobin A1c - Insulin, random  5. Class 3 severe obesity with serious comorbidity and body mass index (BMI) of 45.0 to 49.9 in adult, unspecified obesity type ([place]) April [name] is currently in the action stage of change. As such, her goal is to continue with weight loss efforts. She has agreed  to the Category 2 Plan.   Exercise goals: No exercise has been prescribed at this time.  Behavioral modification strategies: increasing lean protein intake and decreasing simple carbohydrates.  April [name] has agreed to follow-up with our clinic in 3 weeks. She was  informed of the importance of frequent follow-up visits to maximize her success with intensive lifestyle modifications for her multiple health conditions.   April [name] was informed we would discuss her lab results at her next visit unless there is a critical issue that needs to be addressed sooner. April [name] agreed to keep her next visit at the agreed upon time to discuss these results.  Objective:   Blood pressure 109/72, pulse 67, temperature 98.2 F (36.8 C), height 5' (1.524 m), weight 228 lb (103.4 kg), SpO2 97 %. Body mass index is 44.53 kg/m.  General: Cooperative, alert, well developed, in no acute distress. HEENT: Conjunctivae and lids unremarkable. Cardiovascular: Regular rhythm.  Lungs: Normal work of breathing. Neurologic: No focal deficits.   Lab Results  Component Value Date   CREATININE 0.61 07/10/2020   BUN 19 07/10/2020   NA 134 07/10/2020   K 4.4 07/10/2020   CL 97 07/10/2020   CO2 23 07/10/2020   Lab Results  Component Value Date   ALT 10 07/10/2020   AST 17 07/10/2020   ALKPHOS 134 (H) 07/10/2020   BILITOT 0.3 07/10/2020   Lab Results  Component Value Date   HGBA1C 5.4 07/10/2020   HGBA1C 5.5 03/20/2020   HGBA1C 5.4 06/13/2019   HGBA1C 5.2 11/05/2017   HGBA1C 5.4 03/02/2016   Lab Results  Component Value Date   INSULIN 11.6 07/10/2020   INSULIN 7.5 03/20/2020   INSULIN 8.6 10/24/2019   INSULIN 9.5 06/13/2019   Lab Results  Component Value Date   TSH 1.420 06/01/2018   Lab Results  Component Value Date   CHOL 240 (H) 07/10/2020   HDL 55 07/10/2020   LDLCALC 172 (H) 07/10/2020   LDLDIRECT 153 (H) 08/16/2020   TRIG 78 07/10/2020   CHOLHDL 4.4 07/10/2020   Lab Results  Component Value Date   WBC 6.4 10/24/2019   HGB 15.1 10/24/2019   HCT 45.4 10/24/2019   MCV 85 10/24/2019   PLT 354 10/24/2019   Lab Results  Component Value Date   IRON 36 (L) 12/11/2009   TIBC 318 12/11/2009   FERRITIN 19 12/11/2009   Attestation Statements:    Reviewed by clinician on day of visit: allergies, medications, problem list, medical history, surgical history, family history, social history, and previous encounter notes.   Wilhemena Durie, am acting as transcriptionist for [employer], PA-C.  I have reviewed the above documentation for accuracy and completeness, and I agree with the above. Abby Potash, PA-C

## 2020-11-07 LAB — LIPID PANEL
Chol/HDL Ratio: 4.2 ratio (ref 0.0–4.4)
Cholesterol, Total: 223 mg/dL — ABNORMAL HIGH (ref 100–199)
HDL: 53 mg/dL (ref >39)
LDL Chol Calc (NIH): 155 mg/dL — ABNORMAL HIGH (ref 0–99)
Triglycerides: 83 mg/dL (ref 0–149)
VLDL Cholesterol Cal: 15 mg/dL (ref 5–40)

## 2020-11-07 LAB — COMPREHENSIVE METABOLIC PANEL
ALT: 10 IU/L (ref 0–32)
AST: 18 IU/L (ref 0–40)
Albumin/Globulin Ratio: 1.2 (ref 1.2–2.2)
Albumin: 4.3 g/dL (ref 3.8–4.9)
Alkaline Phosphatase: 150 IU/L — ABNORMAL HIGH (ref 44–121)
BUN/Creatinine Ratio: 28 — ABNORMAL HIGH (ref 9–23)
BUN: 19 mg/dL (ref 6–24)
Bilirubin Total: 0.3 mg/dL (ref 0.0–1.2)
CO2: 25 mmol/L (ref 20–29)
Calcium: 9.6 mg/dL (ref 8.7–10.2)
Chloride: 97 mmol/L (ref 96–106)
Creatinine, Ser: 0.69 mg/dL (ref 0.57–1.00)
GFR calc Af Amer: 111 mL/min/{1.73_m2} (ref >59)
GFR calc non Af Amer: 96 mL/min/{1.73_m2} (ref >59)
Globulin, Total: 3.6 g/dL (ref 1.5–4.5)
Glucose: 97 mg/dL (ref 65–99)
Potassium: 3.4 mmol/L — ABNORMAL LOW (ref 3.5–5.2)
Sodium: 136 mmol/L (ref 134–144)
Total Protein: 7.9 g/dL (ref 6.0–8.5)

## 2020-11-07 LAB — HEMOGLOBIN A1C
Est. average glucose Bld gHb Est-mCnc: 105 mg/dL
Hgb A1c MFr Bld: 5.3 % (ref 4.8–5.6)

## 2020-11-07 LAB — INSULIN, RANDOM: INSULIN: 13.8 u[IU]/mL (ref 2.6–24.9)

## 2020-11-07 LAB — VITAMIN D 25 HYDROXY (VIT D DEFICIENCY, FRACTURES): Vit D, 25-Hydroxy: 61.9 ng/mL (ref 30.0–100.0)

## 2020-11-27 ENCOUNTER — Encounter (INDEPENDENT_AMBULATORY_CARE_PROVIDER_SITE_OTHER): Payer: Self-pay

## 2020-11-27 ENCOUNTER — Encounter (INDEPENDENT_AMBULATORY_CARE_PROVIDER_SITE_OTHER): Payer: Self-pay | Admitting: Physician Assistant

## 2020-11-27 ENCOUNTER — Ambulatory Visit (INDEPENDENT_AMBULATORY_CARE_PROVIDER_SITE_OTHER): Payer: Medicaid Other | Admitting: Physician Assistant

## 2020-11-27 ENCOUNTER — Other Ambulatory Visit: Payer: Self-pay

## 2020-11-27 DIAGNOSIS — E559 Vitamin D deficiency, unspecified: Secondary | ICD-10-CM

## 2020-11-27 DIAGNOSIS — I10 Essential (primary) hypertension: Secondary | ICD-10-CM

## 2020-11-27 DIAGNOSIS — Z6837 Body mass index (BMI) 37.0-37.9, adult: Secondary | ICD-10-CM | POA: Diagnosis not present

## 2020-11-27 MED ORDER — VITAMIN D (ERGOCALCIFEROL) 1.25 MG (50000 UNIT) PO CAPS: 50000.0000 [IU] | ORAL_CAPSULE | ORAL | 0 refills | Status: DC

## 2020-11-28 NOTE — Progress Notes (Signed)
Chief Complaint:   OBESITY  April [name] is here to discuss her progress with her obesity treatment plan along with follow-up of her obesity related diagnoses. April [name] is on the Category 2 Plan and states she is following her eating plan approximately 90% of the time. April [name] states she is walking for 30 minutes 3 times per week.  Today's visit was #: 55 Starting weight: 256 lbs Starting date: 06/13/2019 Today's weight: 228 lbs Today's date: 11/27/2020 Total lbs lost to date: 28 Total lbs lost since last in-office visit: 0  Interim History: April [name] continues to do a great job journaling. She is retaining a few pounds of fluid today. Overall, she is meeting her protein and calorie goals.  Subjective:   1. Essential hypertension April [name]'s blood pressure is well controlled, and is managed by her primary care physician. She is on hydrochlorothiazide, and she denies chest pain or headache.  2. Vitamin D deficiency April [name]'s last Vit D level was at goal. Her energy is good overall.  Assessment/Plan:   1. Essential hypertension April [name] will continue her medications, and will continue working on healthy weight loss and exercise to improve blood pressure control. We monitor her blood pressure in the office at every visit, and will watch for signs of hypotension as she continues her lifestyle modifications. She will follow up with her primary care physician.  2. Vitamin D deficiency Low Vitamin D level contributes to fatigue and are associated with obesity, breast, and colon cancer. We will refill prescription Vitamin D for 1 month. April [name] will follow-up for routine testing of Vitamin D, at least 2-3 times per year to avoid over-replacement.  - Vitamin D, Ergocalciferol, (DRISDOL) 1.25 MG (50000 UNIT) CAPS capsule; Take 1 capsule (50,000 Units total) by mouth every 7 (seven) days.  Dispense: 4 capsule; Refill: 0  3. Class 2 severe obesity with serious comorbidity and body mass index  (BMI) of 37.0 to 37.9 in adult, unspecified obesity type ([facility]) April [name] is currently in the action stage of change. As such, her goal is to continue with weight loss efforts. She has agreed to keeping a food journal and adhering to recommended goals of 1100-1200 calories and 80 grams of protein daily.   Exercise goals: As is.  Behavioral modification strategies: planning for success and keeping a strict food journal.  April [name] has agreed to follow-up with our clinic in 3 weeks. She was informed of the importance of frequent follow-up visits to maximize her success with intensive lifestyle modifications for her multiple health conditions.   Objective:   There were no vitals taken for this visit. There is no height or weight on file to calculate BMI.  General: Cooperative, alert, well developed, in no acute distress. HEENT: Conjunctivae and lids unremarkable. Cardiovascular: Regular rhythm.  Lungs: Normal work of breathing. Neurologic: No focal deficits.   Lab Results  Component Value Date   CREATININE 0.69 11/06/2020   BUN 19 11/06/2020   NA 136 11/06/2020   K 3.4 (L) 11/06/2020   CL 97 11/06/2020   CO2 25 11/06/2020   Lab Results  Component Value Date   ALT 10 11/06/2020   AST 18 11/06/2020   ALKPHOS 150 (H) 11/06/2020   BILITOT 0.3 11/06/2020   Lab Results  Component Value Date   HGBA1C 5.3 11/06/2020   HGBA1C 5.4 07/10/2020   HGBA1C 5.5 03/20/2020   HGBA1C 5.4 06/13/2019   HGBA1C 5.2 11/05/2017   Lab Results  Component Value Date   INSULIN 13.8 11/06/2020  INSULIN 11.6 07/10/2020   INSULIN 7.5 03/20/2020   INSULIN 8.6 10/24/2019   INSULIN 9.5 06/13/2019   Lab Results  Component Value Date   TSH 1.420 06/01/2018   Lab Results  Component Value Date   CHOL 223 (H) 11/06/2020   HDL 53 11/06/2020   LDLCALC 155 (H) 11/06/2020   LDLDIRECT 153 (H) 08/16/2020   TRIG 83 11/06/2020   CHOLHDL 4.2 11/06/2020   Lab Results  Component Value Date   WBC 6.4  10/24/2019   HGB 15.1 10/24/2019   HCT 45.4 10/24/2019   MCV 85 10/24/2019   PLT 354 10/24/2019   Lab Results  Component Value Date   IRON 36 (L) 12/11/2009   TIBC 318 12/11/2009   FERRITIN 19 12/11/2009   Attestation Statements:   Reviewed by clinician on day of visit: allergies, medications, problem list, medical history, surgical history, family history, social history, and previous encounter notes.   Wilhemena Durie, am acting as transcriptionist for [employer], PA-C.  I have reviewed the above documentation for accuracy and completeness, and I agree with the above. Abby Potash, PA-C

## 2020-12-17 ENCOUNTER — Encounter (INDEPENDENT_AMBULATORY_CARE_PROVIDER_SITE_OTHER): Payer: Self-pay | Admitting: Physician Assistant

## 2020-12-17 ENCOUNTER — Other Ambulatory Visit: Payer: Self-pay

## 2020-12-17 ENCOUNTER — Ambulatory Visit (INDEPENDENT_AMBULATORY_CARE_PROVIDER_SITE_OTHER): Payer: Medicaid Other | Admitting: Physician Assistant

## 2020-12-17 VITALS — BP 112/76 | HR 59 | Temp 98.6°F | Ht 60.0 in | Wt 224.0 lb

## 2020-12-17 DIAGNOSIS — E559 Vitamin D deficiency, unspecified: Secondary | ICD-10-CM | POA: Diagnosis not present

## 2020-12-17 DIAGNOSIS — E8881 Metabolic syndrome: Secondary | ICD-10-CM | POA: Diagnosis not present

## 2020-12-17 DIAGNOSIS — Z6841 Body Mass Index (BMI) 40.0 and over, adult: Secondary | ICD-10-CM

## 2020-12-17 MED ORDER — VITAMIN D (ERGOCALCIFEROL) 1.25 MG (50000 UNIT) PO CAPS: 50000.0000 [IU] | ORAL_CAPSULE | ORAL | 0 refills | Status: DC

## 2020-12-18 NOTE — Progress Notes (Signed)
Chief Complaint:   OBESITY April [name] is here to discuss her progress with her obesity treatment plan along with follow-up of her obesity related diagnoses. April [name] is on keeping a food journal and adhering to recommended goals of 1100-1200 calories and 80 grams of protein daily and states she is following her eating plan approximately 85% of the time. April [name] states she is walking for 30-40 minutes 4 times per week.  Today's visit was #: 36 Starting weight: 256 lbs Starting date: 06/13/2019 Today's weight: 224 lbs Today's date: 12/17/2020 Total lbs lost to date: 32 Total lbs lost since last in-office visit: 4  Interim History: April [name] did very well with weight loss and tracking her food. She is asking about meal delivery services and protein shakes.  Subjective:   1. Insulin resistance April [name] is not on medications. Her last insulin level was 13.8.  2. Vitamin D deficiency April [name]'s last Vit D level was 61.9. She is on Vit D weekly.  Assessment/Plan:   1. Insulin resistance Elaysha will continue her meal plan, and will continue to work on weight loss, exercise, and decreasing simple carbohydrates to help decrease the risk of diabetes. Auriel agreed to follow-up with [place] as directed to closely monitor her progress.  2. Vitamin D deficiency Low Vitamin D level contributes to fatigue and are associated with obesity, breast, and colon cancer. We will refill prescription Vitamin D for 1 month. April [name] will follow-up for routine testing of Vitamin D, at least 2-3 times per year to avoid over-replacement.  - Vitamin D, Ergocalciferol, (DRISDOL) 1.25 MG (50000 UNIT) CAPS capsule; Take 1 capsule (50,000 Units total) by mouth every 7 (seven) days.  Dispense: 4 capsule; Refill: 0  3. Class 3 severe obesity with serious comorbidity and body mass index (BMI) of 40.0 to 44.9 in adult, unspecified obesity type ([place]) April [name] is currently in the action stage of change. As such, her goal  is to continue with weight loss efforts. She has agreed to keeping a food journal and adhering to recommended goals of 1200 calories and 80 grams of protein daily.   [employer] meal prep information was given today.  Exercise goals: As is.  Behavioral modification strategies: no skipping meals and meal planning and cooking strategies.  April [name] has agreed to follow-up with our clinic in 3 weeks. She was informed of the importance of frequent follow-up visits to maximize her success with intensive lifestyle modifications for her multiple health conditions.   Objective:   Blood pressure 112/76, pulse (!) 59, temperature 98.6 F (37 C), height 5' (1.524 m), weight 224 lb (101.6 kg), SpO2 100 %. Body mass index is 43.75 kg/m.  General: Cooperative, alert, well developed, in no acute distress. HEENT: Conjunctivae and lids unremarkable. Cardiovascular: Regular rhythm.  Lungs: Normal work of breathing. Neurologic: No focal deficits.   Lab Results  Component Value Date   CREATININE 0.69 11/06/2020   BUN 19 11/06/2020   NA 136 11/06/2020   K 3.4 (L) 11/06/2020   CL 97 11/06/2020   CO2 25 11/06/2020   Lab Results  Component Value Date   ALT 10 11/06/2020   AST 18 11/06/2020   ALKPHOS 150 (H) 11/06/2020   BILITOT 0.3 11/06/2020   Lab Results  Component Value Date   HGBA1C 5.3 11/06/2020   HGBA1C 5.4 07/10/2020   HGBA1C 5.5 03/20/2020   HGBA1C 5.4 06/13/2019   HGBA1C 5.2 11/05/2017   Lab Results  Component Value Date   INSULIN 13.8 11/06/2020  INSULIN 11.6 07/10/2020   INSULIN 7.5 03/20/2020   INSULIN 8.6 10/24/2019   INSULIN 9.5 06/13/2019   Lab Results  Component Value Date   TSH 1.420 06/01/2018   Lab Results  Component Value Date   CHOL 223 (H) 11/06/2020   HDL 53 11/06/2020   LDLCALC 155 (H) 11/06/2020   LDLDIRECT 153 (H) 08/16/2020   TRIG 83 11/06/2020   CHOLHDL 4.2 11/06/2020   Lab Results  Component Value Date   WBC 6.4 10/24/2019   HGB 15.1  10/24/2019   HCT 45.4 10/24/2019   MCV 85 10/24/2019   PLT 354 10/24/2019   Lab Results  Component Value Date   IRON 36 (L) 12/11/2009   TIBC 318 12/11/2009   FERRITIN 19 12/11/2009   Attestation Statements:   Reviewed by clinician on day of visit: allergies, medications, problem list, medical history, surgical history, family history, social history, and previous encounter notes.   Wilhemena Durie, am acting as transcriptionist for [employer], PA-C.  I have reviewed the above documentation for accuracy and completeness, and I agree with the above. Abby Potash, PA-C

## 2021-01-01 ENCOUNTER — Ambulatory Visit (INDEPENDENT_AMBULATORY_CARE_PROVIDER_SITE_OTHER): Payer: Medicaid Other

## 2021-01-01 ENCOUNTER — Encounter: Payer: Self-pay | Admitting: Family Medicine

## 2021-01-01 ENCOUNTER — Ambulatory Visit (INDEPENDENT_AMBULATORY_CARE_PROVIDER_SITE_OTHER): Payer: Medicaid Other | Admitting: Family Medicine

## 2021-01-01 ENCOUNTER — Other Ambulatory Visit: Payer: Self-pay

## 2021-01-01 VITALS — BP 112/72 | HR 84 | Wt 233.8 lb

## 2021-01-01 DIAGNOSIS — G44209 Tension-type headache, unspecified, not intractable: Secondary | ICD-10-CM | POA: Insufficient documentation

## 2021-01-01 DIAGNOSIS — Z0100 Encounter for examination of eyes and vision without abnormal findings: Secondary | ICD-10-CM | POA: Diagnosis not present

## 2021-01-01 DIAGNOSIS — M170 Bilateral primary osteoarthritis of knee: Secondary | ICD-10-CM

## 2021-01-01 DIAGNOSIS — Z23 Encounter for immunization: Secondary | ICD-10-CM | POA: Diagnosis present

## 2021-01-01 HISTORY — DX: Tension-type headache, unspecified, not intractable: G44.209

## 2021-01-01 NOTE — Assessment & Plan Note (Signed)
-  headaches likely due to visual strain as patient may need new eyeglasses prescription, ophthalmology referral placed -ibuprofen as needed, instructed use, cautioned against taking without a meal -reassurance provided

## 2021-01-01 NOTE — Assessment & Plan Note (Signed)
-  continue current regimen -encouraged taking tramadol once daily as needed instead of bid as needed -encouraged continued participation in healthy weight and wellness program, encouraged exercise -f/u as needed, consider further lowering dose at next visit

## 2021-01-01 NOTE — Progress Notes (Signed)
    SUBJECTIVE:   CHIEF COMPLAINT / HPI:   Headache Patient presents with concern of headaches that have been ongoing for the past few months. Attributes them to tramadol which she takes for osteoarthritis of knees bilaterally. Reports getting these headaches occasionally most often in the morning which spontaneously resolve on their own within an hour after getting up. Primarily hurts along her forehead bilaterally, denies pain along temporal region. Denies vision changes, dizziness, nausea, vomiting, photophobia, phonophobia and other associated symptoms. Believes that it may be tramadol which is causing her symptoms. States that she often strains when focusing on an object, last eye exam was 3-4 years ago but does not currently see someone regularly. Reports that she feels better when she has her glasses on. Also endorsing multiple personal stressors along with planning her upcoming wedding.   PERTINENT  PMH / PSH:   Osteoarthritis Reports of both knees that has improved. Current regimen includes voltaren gel, tramadol 50 mg bid as needed and also gets corticosteroid injections with Dr. Noemi Chapel every 3 months. Involved in the healthy weight and wellness program which she states is doing well and her pain has improved after she has reported a 40 pound weight loss.    OBJECTIVE:   BP 112/72   Pulse 84   Wt 233 lb 12.8 oz (106.1 kg)   SpO2 96%   BMI 45.66 kg/m   General: Patient well-appearing, in no acute distress. HEENT: PERRLA, no tenderness on palpation of temporal region bilaterally, no tenderness noted across forehead, supple neck CV: RRR, no murmurs or gallops auscultated Resp: CTAB Abdomen: soft, nontender, BS+ Ext: radial pulses strong and equal bilaterally, no LE edema noted bilaterally Neuro: normal gait Psych: mood appropriate   ASSESSMENT/PLAN:   Tension headache -headaches likely due to visual strain as patient may need new eyeglasses prescription, ophthalmology  referral placed -ibuprofen as needed, instructed use, cautioned against taking without a meal -reassurance provided  Osteoarthritis of knees, bilateral -continue current regimen -encouraged taking tramadol once daily as needed instead of bid as needed -encouraged continued participation in healthy weight and wellness program, encouraged exercise -f/u as needed, consider further lowering dose at next visit     Health maintenance -Received COVID booster, patient waited appropriate waiting period without complications.    Donney Dice, [place]

## 2021-01-01 NOTE — Patient Instructions (Addendum)
It was great seeing you today!  Today we discussed your headaches, they may be due to eye strain. I have placed a referral to the eye doctor, sometimes vision issues can cause you to strain and cause a headache.   Please continue taking tramadol 50 mg but try to take once daily and add tylenol to reduce intake of tramadol. I am glad this is helping your knee pain. Continue using voltaren gel as well.   Congratulations on getting your COVID booster, you may not feel your best today but this should resolve within 24-48 hours. Please continue to practice social distancing and wear a mask in public places.  Please follow up at your next scheduled appointment, if anything arises between now and then, please don't hesitate to contact our office.   Thank you for allowing [place] to be a part of your medical care!  Thank you, Dr. Larae Grooms

## 2021-01-01 NOTE — Progress Notes (Signed)
   Covid-19 Vaccination Clinic  Name:  Mackenna Kamer    MRN: [id number] DOB: 1961-03-11  01/01/2021  Ms. Delancey was observed post Covid-19 immunization for 15 minutes without incident. She was provided with Vaccine Information Sheet and instruction to access the V-Safe system.   Ms. Mcgurn was instructed to call 911 with any severe reactions post vaccine: [name] Kitchen Difficulty breathing  . Swelling of face and throat  . A fast heartbeat  . A bad rash all over body  . Dizziness and weakness

## 2021-01-03 ENCOUNTER — Other Ambulatory Visit: Payer: Self-pay | Admitting: Family Medicine

## 2021-01-03 DIAGNOSIS — M170 Bilateral primary osteoarthritis of knee: Secondary | ICD-10-CM

## 2021-01-06 ENCOUNTER — Ambulatory Visit (INDEPENDENT_AMBULATORY_CARE_PROVIDER_SITE_OTHER): Payer: Medicaid Other | Admitting: Physician Assistant

## 2021-01-06 ENCOUNTER — Other Ambulatory Visit: Payer: Self-pay

## 2021-01-06 VITALS — BP 100/64 | HR 65 | Temp 97.9°F | Ht 60.0 in | Wt 224.0 lb

## 2021-01-06 DIAGNOSIS — E7849 Other hyperlipidemia: Secondary | ICD-10-CM | POA: Diagnosis not present

## 2021-01-06 DIAGNOSIS — Z6841 Body Mass Index (BMI) 40.0 and over, adult: Secondary | ICD-10-CM

## 2021-01-08 NOTE — Progress Notes (Signed)
Chief Complaint:   OBESITY April [name] is here to discuss her progress with her obesity treatment plan along with follow-up of her obesity related diagnoses. April [name] is on keeping a food journal and adhering to recommended goals of 1200 calories and 80 grams of protein daily and states she is following her eating plan approximately 80% of the time. April [name] states she is walking for 30-40 minutes 3 times per week.  Today's visit was #: 47 Starting weight: 256 lbs Starting date: 06/13/2019 Today's weight: 224 lbs Today's date: 01/06/2021 Total lbs lost to date: 32 Total lbs lost since last in-office visit: 0  Interim History: April [name] reports that the last 2 weeks have been stressful. She is journaling well but not hitting her protein goal many days.  Subjective:   1. Other hyperlipidemia April [name] is not on medications, and she denies chest pain. She is walking 3-4 times per week.  Assessment/Plan:   1. Other hyperlipidemia Cardiovascular risk and specific lipid/LDL goals reviewed. We discussed several lifestyle modifications today. April [name] will her meal plan and will continue to work on diet, exercise and weight loss efforts. Orders and follow up as documented in patient record.   Counseling Intensive lifestyle modifications are the first line treatment for this issue. . Dietary changes: Increase soluble fiber. Decrease simple carbohydrates. . Exercise changes: Moderate to vigorous-intensity aerobic activity 150 minutes per week if tolerated. . Lipid-lowering medications: see documented in medical record.  2. Class 3 severe obesity with serious comorbidity and body mass index (BMI) of 40.0 to 44.9 in adult, unspecified obesity type (April [name]) April [name] is currently in the action stage of change. As such, her goal is to continue with weight loss efforts. She has agreed to keeping a food journal and adhering to recommended goals of 1100-1200 calories and 85 grams of protein daily.    Exercise goals: As is.  Behavioral modification strategies: increasing lean protein intake and no skipping meals.  April [name] has agreed to follow-up with our clinic in 3 weeks. She was informed of the importance of frequent follow-up visits to maximize her success with intensive lifestyle modifications for her multiple health conditions.   Objective:   Blood pressure 100/64, pulse 65, temperature 97.9 F (36.6 C), height 5' (1.524 m), weight 224 lb (101.6 kg), SpO2 98 %. Body mass index is 43.75 kg/m.  General: Cooperative, alert, well developed, in no acute distress. HEENT: Conjunctivae and lids unremarkable. Cardiovascular: Regular rhythm.  Lungs: Normal work of breathing. Neurologic: No focal deficits.   Lab Results  Component Value Date   CREATININE 0.69 11/06/2020   BUN 19 11/06/2020   NA 136 11/06/2020   K 3.4 (L) 11/06/2020   CL 97 11/06/2020   CO2 25 11/06/2020   Lab Results  Component Value Date   ALT 10 11/06/2020   AST 18 11/06/2020   ALKPHOS 150 (H) 11/06/2020   BILITOT 0.3 11/06/2020   Lab Results  Component Value Date   HGBA1C 5.3 11/06/2020   HGBA1C 5.4 07/10/2020   HGBA1C 5.5 03/20/2020   HGBA1C 5.4 06/13/2019   HGBA1C 5.2 11/05/2017   Lab Results  Component Value Date   INSULIN 13.8 11/06/2020   INSULIN 11.6 07/10/2020   INSULIN 7.5 03/20/2020   INSULIN 8.6 10/24/2019   INSULIN 9.5 06/13/2019   Lab Results  Component Value Date   TSH 1.420 06/01/2018   Lab Results  Component Value Date   CHOL 223 (H) 11/06/2020   HDL 53 11/06/2020   LDLCALC 155 (H)  11/06/2020   LDLDIRECT 153 (H) 08/16/2020   TRIG 83 11/06/2020   CHOLHDL 4.2 11/06/2020   Lab Results  Component Value Date   WBC 6.4 10/24/2019   HGB 15.1 10/24/2019   HCT 45.4 10/24/2019   MCV 85 10/24/2019   PLT 354 10/24/2019   Lab Results  Component Value Date   IRON 36 (L) 12/11/2009   TIBC 318 12/11/2009   FERRITIN 19 12/11/2009   Attestation Statements:   Reviewed  by clinician on day of visit: allergies, medications, problem list, medical history, surgical history, family history, social history, and previous encounter notes.   Wilhemena Durie, am acting as transcriptionist for [employer], PA-C.  I have reviewed the above documentation for accuracy and completeness, and I agree with the above. -  *Abby Potash, PA-C

## 2021-01-14 DIAGNOSIS — M1712 Unilateral primary osteoarthritis, left knee: Secondary | ICD-10-CM | POA: Diagnosis not present

## 2021-01-29 ENCOUNTER — Ambulatory Visit (INDEPENDENT_AMBULATORY_CARE_PROVIDER_SITE_OTHER): Payer: Medicaid Other | Admitting: Physician Assistant

## 2021-02-10 ENCOUNTER — Ambulatory Visit (INDEPENDENT_AMBULATORY_CARE_PROVIDER_SITE_OTHER): Payer: Medicaid Other | Admitting: Physician Assistant

## 2021-02-10 ENCOUNTER — Encounter (INDEPENDENT_AMBULATORY_CARE_PROVIDER_SITE_OTHER): Payer: Self-pay | Admitting: Physician Assistant

## 2021-02-10 ENCOUNTER — Other Ambulatory Visit: Payer: Self-pay

## 2021-02-10 VITALS — BP 116/77 | HR 66 | Temp 98.1°F | Ht 60.0 in | Wt 219.0 lb

## 2021-02-10 DIAGNOSIS — E559 Vitamin D deficiency, unspecified: Secondary | ICD-10-CM

## 2021-02-10 DIAGNOSIS — Z6841 Body Mass Index (BMI) 40.0 and over, adult: Secondary | ICD-10-CM

## 2021-02-10 DIAGNOSIS — E66813 Obesity, class 3: Secondary | ICD-10-CM

## 2021-02-10 MED ORDER — VITAMIN D (ERGOCALCIFEROL) 1.25 MG (50000 UNIT) PO CAPS: 50000.0000 [IU] | ORAL_CAPSULE | ORAL | 0 refills | Status: DC

## 2021-02-11 NOTE — Progress Notes (Signed)
Chief Complaint:   OBESITY April [name] is here to discuss her progress with her obesity treatment plan along with follow-up of her obesity related diagnoses. April [name] is on keeping a food journal and adhering to recommended goals of 1100-1200 calories and 85 grams of protein daily and states she is following her eating plan approximately 75% of the time. April [name] states she is walking for 30 minutes 3 times per week.  Today's visit was #: 20 Starting weight: 256 lbs Starting date: 06/13/2019 Today's weight: 219 lbs Today's date: 02/10/2021 Total lbs lost to date: 37 Total lbs lost since last in-office visit: 5  Interim History: April [name] did great with weight loss. She is journaling consistently and most days hitting her protein goals. Her son is getting married and her daughter is graduating in the next 2 weeks.  Subjective:   1. Vitamin D deficiency April [name] is on Vit D, and she denies nausea, vomiting, or muscle weakness.  Assessment/Plan:   1. Vitamin D deficiency Low Vitamin D level contributes to fatigue and are associated with obesity, breast, and colon cancer. We will refill prescription Vitamin D for 1 month. April [name] will follow-up for routine testing of Vitamin D, at least 2-3 times per year to avoid over-replacement.  - Vitamin D, Ergocalciferol, (DRISDOL) 1.25 MG (50000 UNIT) CAPS capsule; Take 1 capsule (50,000 Units total) by mouth every 7 (seven) days.  Dispense: 4 capsule; Refill: 0  2. Class 3 severe obesity with serious comorbidity and body mass index (BMI) of 40.0 to 44.9 in adult, unspecified obesity type (April [name]) April [name] is currently in the action stage of change. As such, her goal is to continue with weight loss efforts. She has agreed to keeping a food journal and adhering to recommended goals of 1200 calories and 85 grams of protein daily.   Exercise goals: As is.  Behavioral modification strategies: meal planning and cooking strategies and keeping healthy foods  in the home.  April [name] has agreed to follow-up with our clinic in 3 weeks. She was informed of the importance of frequent follow-up visits to maximize her success with intensive lifestyle modifications for her multiple health conditions.   Objective:   Blood pressure 116/77, pulse 66, temperature 98.1 F (36.7 C), height 5' (1.524 m), weight 219 lb (99.3 kg), SpO2 99 %. Body mass index is 42.77 kg/m.  General: Cooperative, alert, well developed, in no acute distress. HEENT: Conjunctivae and lids unremarkable. Cardiovascular: Regular rhythm.  Lungs: Normal work of breathing. Neurologic: No focal deficits.   Lab Results  Component Value Date   CREATININE 0.69 11/06/2020   BUN 19 11/06/2020   NA 136 11/06/2020   K 3.4 (L) 11/06/2020   CL 97 11/06/2020   CO2 25 11/06/2020   Lab Results  Component Value Date   ALT 10 11/06/2020   AST 18 11/06/2020   ALKPHOS 150 (H) 11/06/2020   BILITOT 0.3 11/06/2020   Lab Results  Component Value Date   HGBA1C 5.3 11/06/2020   HGBA1C 5.4 07/10/2020   HGBA1C 5.5 03/20/2020   HGBA1C 5.4 06/13/2019   HGBA1C 5.2 11/05/2017   Lab Results  Component Value Date   INSULIN 13.8 11/06/2020   INSULIN 11.6 07/10/2020   INSULIN 7.5 03/20/2020   INSULIN 8.6 10/24/2019   INSULIN 9.5 06/13/2019   Lab Results  Component Value Date   TSH 1.420 06/01/2018   Lab Results  Component Value Date   CHOL 223 (H) 11/06/2020   HDL 53 11/06/2020   LDLCALC 155 (  H) 11/06/2020   LDLDIRECT 153 (H) 08/16/2020   TRIG 83 11/06/2020   CHOLHDL 4.2 11/06/2020   Lab Results  Component Value Date   WBC 6.4 10/24/2019   HGB 15.1 10/24/2019   HCT 45.4 10/24/2019   MCV 85 10/24/2019   PLT 354 10/24/2019   Lab Results  Component Value Date   IRON 36 (L) 12/11/2009   TIBC 318 12/11/2009   FERRITIN 19 12/11/2009   Attestation Statements:   Reviewed by clinician on day of visit: allergies, medications, problem list, medical history, surgical history, family  history, social history, and previous encounter notes.    Wilhemena Durie, am acting as Location manager for [employer], PA-C.  I have reviewed the above documentation for accuracy and completeness, and I agree with the above. Abby Potash, PA-C

## 2021-03-03 ENCOUNTER — Encounter (INDEPENDENT_AMBULATORY_CARE_PROVIDER_SITE_OTHER): Payer: Self-pay | Admitting: Physician Assistant

## 2021-03-03 ENCOUNTER — Other Ambulatory Visit: Payer: Self-pay

## 2021-03-03 ENCOUNTER — Ambulatory Visit (INDEPENDENT_AMBULATORY_CARE_PROVIDER_SITE_OTHER): Payer: Medicaid Other | Admitting: Physician Assistant

## 2021-03-03 VITALS — BP 108/71 | HR 65 | Temp 97.9°F | Ht 60.0 in | Wt 220.0 lb

## 2021-03-03 DIAGNOSIS — E8881 Metabolic syndrome: Secondary | ICD-10-CM | POA: Diagnosis not present

## 2021-03-03 DIAGNOSIS — Z6841 Body Mass Index (BMI) 40.0 and over, adult: Secondary | ICD-10-CM | POA: Diagnosis not present

## 2021-03-03 NOTE — Progress Notes (Signed)
Chief Complaint:   OBESITY April [name] is here to discuss her progress with her obesity treatment plan along with follow-up of her obesity related diagnoses. April [name] is on keeping a food journal and adhering to recommended goals of 1200 calories and 85 grams of protein daily and states she is following her eating plan approximately 80% of the time. April [name] states she is walking for 30-40 minutes 3 times per week.  Today's visit was #: 41 Starting weight: 256 lbs Starting date: 06/13/2019 Today's weight: 220 lbs Today's date: 03/03/2021 Total lbs lost to date: 36 Total lbs lost since last in-office visit: 0  Interim History: April [name] has been stressed recently and she has been over-snacking. She does not have any travel or celebrations coming up.  Subjective:   1. Insulin resistance April [name]'s last insulin level was 13.8 and A1c 5.3. She denies polyphagia, and she is not on medications.  Assessment/Plan:   1. Insulin resistance April [name] will continue to work on weight loss, exercise, and decreasing simple carbohydrates to help decrease the risk of diabetes. We will recheck labs at her next visit. April [name] agreed to follow-up with [place] as directed to closely monitor her progress.  2. Class 3 severe obesity with serious comorbidity and body mass index (BMI) of 40.0 to 44.9 in adult, unspecified obesity type ([place]) April [name] is currently in the action stage of change. As such, her goal is to continue with weight loss efforts. She has agreed to keeping a food journal and adhering to recommended goals of 1200 calories and 85 grams of protein daily.   Exercise goals: As is.  Behavioral modification strategies: increasing lean protein intake and decreasing simple carbohydrates.  April [name] has agreed to follow-up with our clinic in 4 weeks. She was informed of the importance of frequent follow-up visits to maximize her success with intensive lifestyle modifications for her multiple health  conditions.   Objective:   Blood pressure 108/71, pulse 65, temperature 97.9 F (36.6 C), height 5' (1.524 m), weight 220 lb (99.8 kg), SpO2 97 %. Body mass index is 42.97 kg/m.  General: Cooperative, alert, well developed, in no acute distress. HEENT: Conjunctivae and lids unremarkable. Cardiovascular: Regular rhythm.  Lungs: Normal work of breathing. Neurologic: No focal deficits.   Lab Results  Component Value Date   CREATININE 0.69 11/06/2020   BUN 19 11/06/2020   NA 136 11/06/2020   K 3.4 (L) 11/06/2020   CL 97 11/06/2020   CO2 25 11/06/2020   Lab Results  Component Value Date   ALT 10 11/06/2020   AST 18 11/06/2020   ALKPHOS 150 (H) 11/06/2020   BILITOT 0.3 11/06/2020   Lab Results  Component Value Date   HGBA1C 5.3 11/06/2020   HGBA1C 5.4 07/10/2020   HGBA1C 5.5 03/20/2020   HGBA1C 5.4 06/13/2019   HGBA1C 5.2 11/05/2017   Lab Results  Component Value Date   INSULIN 13.8 11/06/2020   INSULIN 11.6 07/10/2020   INSULIN 7.5 03/20/2020   INSULIN 8.6 10/24/2019   INSULIN 9.5 06/13/2019   Lab Results  Component Value Date   TSH 1.420 06/01/2018   Lab Results  Component Value Date   CHOL 223 (H) 11/06/2020   HDL 53 11/06/2020   LDLCALC 155 (H) 11/06/2020   LDLDIRECT 153 (H) 08/16/2020   TRIG 83 11/06/2020   CHOLHDL 4.2 11/06/2020   Lab Results  Component Value Date   WBC 6.4 10/24/2019   HGB 15.1 10/24/2019   HCT 45.4 10/24/2019   MCV 85  10/24/2019   PLT 354 10/24/2019   Lab Results  Component Value Date   IRON 36 (L) 12/11/2009   TIBC 318 12/11/2009   FERRITIN 19 12/11/2009   Attestation Statements:   Reviewed by clinician on day of visit: allergies, medications, problem list, medical history, surgical history, family history, social history, and previous encounter notes.   Wilhemena Durie, am acting as transcriptionist for [employer], PA-C.  I have reviewed the above documentation for accuracy and completeness, and I agree with  the above. -  *Abby Potash, PA-C

## 2021-03-06 ENCOUNTER — Other Ambulatory Visit: Payer: Self-pay

## 2021-03-06 ENCOUNTER — Ambulatory Visit (HOSPITAL_COMMUNITY)
Admission: EM | Admit: 2021-03-06 | Discharge: 2021-03-06 | Disposition: A | Discharge status: Home / Self Care | Payer: Medicaid Other | Attending: Physician Assistant | Admitting: Physician Assistant

## 2021-03-06 ENCOUNTER — Encounter (HOSPITAL_COMMUNITY): Payer: Self-pay

## 2021-03-06 DIAGNOSIS — H1132 Conjunctival hemorrhage, left eye: Secondary | ICD-10-CM

## 2021-03-06 NOTE — ED Triage Notes (Signed)
Pt c/o left eye pain and states it has blood in it X 1 day. Pt denies blurry vision.
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
## 2021-03-06 NOTE — Discharge Instructions (Signed)
We do not need to do anything about the conjunctival hemorrhage as long as it improves.  It can take several weeks.  If you have any changes to your vision or if anything worsens please return for reevaluation we will obtain blood work as we discussed.

## 2021-03-11 ENCOUNTER — Other Ambulatory Visit: Payer: Self-pay | Admitting: Family Medicine

## 2021-03-11 ENCOUNTER — Other Ambulatory Visit (INDEPENDENT_AMBULATORY_CARE_PROVIDER_SITE_OTHER): Payer: Self-pay | Admitting: Physician Assistant

## 2021-03-11 DIAGNOSIS — I10 Essential (primary) hypertension: Secondary | ICD-10-CM

## 2021-03-12 NOTE — Telephone Encounter (Signed)
Pt last seen by Tracey Aguilar, PA-C.  

## 2021-03-12 NOTE — Telephone Encounter (Signed)
Would you like to refill? 

## 2021-03-17 ENCOUNTER — Other Ambulatory Visit: Payer: Self-pay | Admitting: Family Medicine

## 2021-03-17 DIAGNOSIS — M170 Bilateral primary osteoarthritis of knee: Secondary | ICD-10-CM

## 2021-03-19 ENCOUNTER — Other Ambulatory Visit: Payer: Self-pay | Admitting: Family Medicine

## 2021-03-19 DIAGNOSIS — M170 Bilateral primary osteoarthritis of knee: Secondary | ICD-10-CM

## 2021-03-19 NOTE — Telephone Encounter (Signed)
Patient calls nurse line checking on the status of prescription refill.

## 2021-03-20 NOTE — Telephone Encounter (Signed)
Patient returns call to nurse line to check status of rx refill. Paged provider at 1030.   Will await response.   Talbot Grumbling, RN

## 2021-04-07 ENCOUNTER — Ambulatory Visit (INDEPENDENT_AMBULATORY_CARE_PROVIDER_SITE_OTHER): Payer: Medicaid Other | Admitting: Physician Assistant

## 2021-04-07 ENCOUNTER — Encounter (INDEPENDENT_AMBULATORY_CARE_PROVIDER_SITE_OTHER): Payer: Self-pay | Admitting: Physician Assistant

## 2021-04-07 ENCOUNTER — Other Ambulatory Visit: Payer: Self-pay

## 2021-04-07 VITALS — BP 120/71 | HR 63 | Temp 98.5°F | Ht 60.0 in | Wt 220.0 lb

## 2021-04-07 DIAGNOSIS — R739 Hyperglycemia, unspecified: Secondary | ICD-10-CM

## 2021-04-07 DIAGNOSIS — E7849 Other hyperlipidemia: Secondary | ICD-10-CM | POA: Diagnosis not present

## 2021-04-07 DIAGNOSIS — E559 Vitamin D deficiency, unspecified: Secondary | ICD-10-CM | POA: Diagnosis not present

## 2021-04-07 DIAGNOSIS — Z6841 Body Mass Index (BMI) 40.0 and over, adult: Secondary | ICD-10-CM

## 2021-04-07 MED ORDER — VITAMIN D (ERGOCALCIFEROL) 1.25 MG (50000 UNIT) PO CAPS: 50000.0000 [IU] | ORAL_CAPSULE | ORAL | 0 refills | Status: DC

## 2021-04-08 LAB — COMPREHENSIVE METABOLIC PANEL
ALT: 12 IU/L (ref 0–32)
AST: 18 IU/L (ref 0–40)
Albumin/Globulin Ratio: 1.3 (ref 1.2–2.2)
Albumin: 4.3 g/dL (ref 3.8–4.9)
Alkaline Phosphatase: 146 IU/L — ABNORMAL HIGH (ref 44–121)
BUN/Creatinine Ratio: 21 (ref 9–23)
BUN: 17 mg/dL (ref 6–24)
Bilirubin Total: 0.3 mg/dL (ref 0.0–1.2)
CO2: 25 mmol/L (ref 20–29)
Calcium: 9.5 mg/dL (ref 8.7–10.2)
Chloride: 95 mmol/L — ABNORMAL LOW (ref 96–106)
Creatinine, Ser: 0.8 mg/dL (ref 0.57–1.00)
Globulin, Total: 3.4 g/dL (ref 1.5–4.5)
Glucose: 91 mg/dL (ref 65–99)
Potassium: 4 mmol/L (ref 3.5–5.2)
Sodium: 139 mmol/L (ref 134–144)
Total Protein: 7.7 g/dL (ref 6.0–8.5)
eGFR: 85 mL/min/{1.73_m2} (ref >59)

## 2021-04-08 LAB — LIPID PANEL
Chol/HDL Ratio: 3 ratio (ref 0.0–4.4)
Cholesterol, Total: 215 mg/dL — ABNORMAL HIGH (ref 100–199)
HDL: 72 mg/dL (ref >39)
LDL Chol Calc (NIH): 129 mg/dL — ABNORMAL HIGH (ref 0–99)
Triglycerides: 81 mg/dL (ref 0–149)
VLDL Cholesterol Cal: 14 mg/dL (ref 5–40)

## 2021-04-08 LAB — HEMOGLOBIN A1C
Est. average glucose Bld gHb Est-mCnc: 163 mg/dL
Hgb A1c MFr Bld: 7.3 % — ABNORMAL HIGH (ref 4.8–5.6)

## 2021-04-08 LAB — VITAMIN D 25 HYDROXY (VIT D DEFICIENCY, FRACTURES): Vit D, 25-Hydroxy: 65.2 ng/mL (ref 30.0–100.0)

## 2021-04-08 LAB — INSULIN, RANDOM: INSULIN: 3.6 u[IU]/mL (ref 2.6–24.9)

## 2021-04-09 NOTE — Progress Notes (Signed)
Chief Complaint:   OBESITY April [name] is here to discuss her progress with her obesity treatment plan along with follow-up of her obesity related diagnoses. April [name] is on keeping a food journal and adhering to recommended goals of 1200 calories and 85 grams of protein daily and states she is following her eating plan approximately 85% of the time. April [name] states she is walking 30-40 minutes 3 times per week.  Today's visit was #: 60 Starting weight: 256 lbs Starting date: 06/13/2019 Today's weight: 220 lbs Today's date: 04/07/2021 Total lbs lost to date: 36 Total lbs lost since last in-office visit: 0  Interim History: April [name] has been journaling regularly. She is not reaching her protein goal about 1/2 of the time. She is not always hitting her calories.  Subjective:   1. Vitamin D deficiency April [name] is on Vit D weekly, and she is tolerating it well.  2. Hyperglycemia April [name] denies polyphagia, and she is not on medications.  3. Other hyperlipidemia April [name] is not on medications. Her last lipid panel was not at goal.  Assessment/Plan:   1. Vitamin D deficiency Low Vitamin D level contributes to fatigue and are associated with obesity, breast, and colon cancer. We will check labs today, and we will refill prescription Vitamin D for 1 month. [place] will follow-up for routine testing of Vitamin D, at least 2-3 times per year to avoid over-replacement.  - VITAMIN D 25 Hydroxy (Vit-D Deficiency, Fractures) - Vitamin D, Ergocalciferol, (DRISDOL) 1.25 MG (50000 UNIT) CAPS capsule; Take 1 capsule (50,000 Units total) by mouth every 7 (seven) days.  Dispense: 4 capsule; Refill: 0  2. Hyperglycemia We will check labs today, and results with be discussed with April [name] in 2 weeks at her follow up visit. In the meanwhile April [name] will continue her meal plan and will work on weight loss efforts.  - Comprehensive metabolic panel - Insulin, random - Hemoglobin A1c  3. Other  hyperlipidemia Cardiovascular risk and specific lipid/LDL goals reviewed. We discussed several lifestyle modifications today. We will check labs today. April [name] will continue her meal plan, exercise, and weight loss efforts. Orders and follow up as documented in patient record.   Counseling Intensive lifestyle modifications are the first line treatment for this issue. Dietary changes: Increase soluble fiber. Decrease simple carbohydrates. Exercise changes: Moderate to vigorous-intensity aerobic activity 150 minutes per week if tolerated. Lipid-lowering medications: see documented in medical record.  - Lipid panel  4. Class 3 severe obesity with serious comorbidity and body mass index (BMI) of 40.0 to 44.9 in adult, unspecified obesity type (April [name]) April [name] is currently in the action stage of change. As such, her goal is to continue with weight loss efforts. She has agreed to keeping a food journal and adhering to recommended goals of 1100-1200 calories and 85 grams of protein daily.   Exercise goals: As is.  Behavioral modification strategies: meal planning and cooking strategies, planning for success, and keeping a strict food journal.  April [name] has agreed to follow-up with our clinic in 3 weeks. She was informed of the importance of frequent follow-up visits to maximize her success with intensive lifestyle modifications for her multiple health conditions.   April [name] was informed we would discuss her lab results at her next visit unless there is a critical issue that needs to be addressed sooner. April [name] agreed to keep her next visit at the agreed upon time to discuss these results.  Objective:   Blood pressure 120/71, pulse 63, temperature 98.5 F (36.9 C), height  5' (1.524 m), weight 220 lb (99.8 kg), SpO2 99 %. Body mass index is 42.97 kg/m.  General: Cooperative, alert, well developed, in no acute distress. HEENT: Conjunctivae and lids unremarkable. Cardiovascular: Regular rhythm.   Lungs: Normal work of breathing. Neurologic: No focal deficits.   Lab Results  Component Value Date   CREATININE 0.80 04/07/2021   BUN 17 04/07/2021   NA 139 04/07/2021   K 4.0 04/07/2021   CL 95 (L) 04/07/2021   CO2 25 04/07/2021   Lab Results  Component Value Date   ALT 12 04/07/2021   AST 18 04/07/2021   ALKPHOS 146 (H) 04/07/2021   BILITOT 0.3 04/07/2021   Lab Results  Component Value Date   HGBA1C 7.3 (H) 04/07/2021   HGBA1C 5.3 11/06/2020   HGBA1C 5.4 07/10/2020   HGBA1C 5.5 03/20/2020   HGBA1C 5.4 06/13/2019   Lab Results  Component Value Date   INSULIN 3.6 04/07/2021   INSULIN 13.8 11/06/2020   INSULIN 11.6 07/10/2020   INSULIN 7.5 03/20/2020   INSULIN 8.6 10/24/2019   Lab Results  Component Value Date   TSH 1.420 06/01/2018   Lab Results  Component Value Date   CHOL 215 (H) 04/07/2021   HDL 72 04/07/2021   LDLCALC 129 (H) 04/07/2021   LDLDIRECT 153 (H) 08/16/2020   TRIG 81 04/07/2021   CHOLHDL 3.0 04/07/2021   Lab Results  Component Value Date   VD25OH 65.2 04/07/2021   VD25OH 61.9 11/06/2020   VD25OH 56.9 07/10/2020   Lab Results  Component Value Date   WBC 6.4 10/24/2019   HGB 15.1 10/24/2019   HCT 45.4 10/24/2019   MCV 85 10/24/2019   PLT 354 10/24/2019   Lab Results  Component Value Date   IRON 36 (L) 12/11/2009   TIBC 318 12/11/2009   FERRITIN 19 12/11/2009   Attestation Statements:   Reviewed by clinician on day of visit: allergies, medications, problem list, medical history, surgical history, family history, social history, and previous encounter notes.   Wilhemena Durie, am acting as transcriptionist for [employer], PA-C.  I have reviewed the above documentation for accuracy and completeness, and I agree with the above. Abby Potash, PA-C

## 2021-04-17 DIAGNOSIS — M1712 Unilateral primary osteoarthritis, left knee: Secondary | ICD-10-CM | POA: Diagnosis not present

## 2021-04-28 ENCOUNTER — Other Ambulatory Visit: Payer: Self-pay

## 2021-04-28 ENCOUNTER — Ambulatory Visit (INDEPENDENT_AMBULATORY_CARE_PROVIDER_SITE_OTHER): Payer: Medicaid Other | Admitting: Physician Assistant

## 2021-04-28 ENCOUNTER — Encounter (INDEPENDENT_AMBULATORY_CARE_PROVIDER_SITE_OTHER): Payer: Self-pay | Admitting: Physician Assistant

## 2021-04-28 VITALS — BP 104/69 | HR 66 | Temp 98.1°F | Ht 60.0 in | Wt 215.0 lb

## 2021-04-28 DIAGNOSIS — E7849 Other hyperlipidemia: Secondary | ICD-10-CM | POA: Diagnosis not present

## 2021-04-28 DIAGNOSIS — Z6841 Body Mass Index (BMI) 40.0 and over, adult: Secondary | ICD-10-CM

## 2021-04-28 DIAGNOSIS — R739 Hyperglycemia, unspecified: Secondary | ICD-10-CM | POA: Diagnosis not present

## 2021-04-29 DIAGNOSIS — R739 Hyperglycemia, unspecified: Secondary | ICD-10-CM | POA: Diagnosis not present

## 2021-04-29 DIAGNOSIS — M1712 Unilateral primary osteoarthritis, left knee: Secondary | ICD-10-CM | POA: Diagnosis not present

## 2021-04-30 LAB — HEMOGLOBIN A1C
Est. average glucose Bld gHb Est-mCnc: 111 mg/dL
Hgb A1c MFr Bld: 5.5 % (ref 4.8–5.6)

## 2021-05-01 DIAGNOSIS — L80 Vitiligo: Secondary | ICD-10-CM | POA: Diagnosis not present

## 2021-05-06 DIAGNOSIS — M1712 Unilateral primary osteoarthritis, left knee: Secondary | ICD-10-CM | POA: Diagnosis not present

## 2021-05-06 NOTE — Progress Notes (Signed)
Chief Complaint:   OBESITY April [name] is here to discuss her progress with her obesity treatment plan along with follow-up of her obesity related diagnoses. April [name] is on keeping a food journal and adhering to recommended goals of 1100-1200 calories and 85 grams of protein daily and states she is following her eating plan approximately 80% of the time. April [name] states she is doing wall pushups, stretching, and exercises for 30 minutes 3 times per week.  Today's visit was #: 29 Starting weight: 256 lbs Starting date: 06/13/2019 Today's weight: 215 lbs Today's date: 04/28/2021 Total lbs lost to date: 41 Total lbs lost since last in-office visit: 5  Interim History: April [name] did a great job with weight loss. She met or exceeded her protein goal consistently. Her energy has increased since increasing her protein.  Subjective:   1. Other hyperlipidemia April [name]'s last lipid panel has much improved. She is not on medications. I discussed labs with the patient today.  2. Hyperglycemia April [name] denies polyphagia. Her last A1c was most likely a lab error, as she has lost 41 lbs and previous A1c was 5.3. I discussed labs with the patient today.  Assessment/Plan:   1. Other hyperlipidemia Cardiovascular risk and specific lipid/LDL goals reviewed. We discussed several lifestyle modifications today. Gwendolynn will continue her meal plan, exercise, and weight loss efforts. Orders and follow up as documented in patient record.   Counseling Intensive lifestyle modifications are the first line treatment for this issue. Dietary changes: Increase soluble fiber. Decrease simple carbohydrates. Exercise changes: Moderate to vigorous-intensity aerobic activity 150 minutes per week if tolerated. Lipid-lowering medications: see documented in medical record.  2. Hyperglycemia We will check labs today, and results with be discussed with April [name] in 3 weeks at her follow up visit. In the meanwhile April [name]  will continue her meal plan and will work on weight loss efforts.  - Hemoglobin A1c  3. Class 3 severe obesity with serious comorbidity and body mass index (BMI) of 40.0 to 44.9 in adult, unspecified obesity type ([place]), current bmi 41.99 April [name] is currently in the action stage of change. As such, her goal is to continue with weight loss efforts. She has agreed to keeping a food journal and adhering to recommended goals of 1100-1200 calories and 85 grams of protein daily.   Exercise goals: As is.  Behavioral modification strategies: meal planning and cooking strategies, keeping healthy foods in the home, and keeping a strict food journal.  April [name] has agreed to follow-up with our clinic in 3 weeks. She was informed of the importance of frequent follow-up visits to maximize her success with intensive lifestyle modifications for her multiple health conditions.   April [name] was informed we would discuss her lab results at her next visit unless there is a critical issue that needs to be addressed sooner. April [name] agreed to keep her next visit at the agreed upon time to discuss these results.  Objective:   Blood pressure 104/69, pulse 66, temperature 98.1 F (36.7 C), height 5' (1.524 m), weight 215 lb (97.5 kg), SpO2 97 %. Body mass index is 41.99 kg/m.  General: Cooperative, alert, well developed, in no acute distress. HEENT: Conjunctivae and lids unremarkable. Cardiovascular: Regular rhythm.  Lungs: Normal work of breathing. Neurologic: No focal deficits.   Lab Results  Component Value Date   CREATININE 0.80 04/07/2021   BUN 17 04/07/2021   NA 139 04/07/2021   K 4.0 04/07/2021   CL 95 (L) 04/07/2021   CO2 25 04/07/2021  Lab Results  Component Value Date   ALT 12 04/07/2021   AST 18 04/07/2021   ALKPHOS 146 (H) 04/07/2021   BILITOT 0.3 04/07/2021   Lab Results  Component Value Date   HGBA1C 5.5 04/29/2021   HGBA1C 7.3 (H) 04/07/2021   HGBA1C 5.3 11/06/2020   HGBA1C 5.4  07/10/2020   HGBA1C 5.5 03/20/2020   Lab Results  Component Value Date   INSULIN 3.6 04/07/2021   INSULIN 13.8 11/06/2020   INSULIN 11.6 07/10/2020   INSULIN 7.5 03/20/2020   INSULIN 8.6 10/24/2019   Lab Results  Component Value Date   TSH 1.420 06/01/2018   Lab Results  Component Value Date   CHOL 215 (H) 04/07/2021   HDL 72 04/07/2021   LDLCALC 129 (H) 04/07/2021   LDLDIRECT 153 (H) 08/16/2020   TRIG 81 04/07/2021   CHOLHDL 3.0 04/07/2021   Lab Results  Component Value Date   VD25OH 65.2 04/07/2021   VD25OH 61.9 11/06/2020   VD25OH 56.9 07/10/2020   Lab Results  Component Value Date   WBC 6.4 10/24/2019   HGB 15.1 10/24/2019   HCT 45.4 10/24/2019   MCV 85 10/24/2019   PLT 354 10/24/2019   Lab Results  Component Value Date   IRON 36 (L) 12/11/2009   TIBC 318 12/11/2009   FERRITIN 19 12/11/2009   Attestation Statements:   Reviewed by clinician on day of visit: allergies, medications, problem list, medical history, surgical history, family history, social history, and previous encounter notes.   Wilhemena Durie, am acting as transcriptionist for [employer], PA-C.  I have reviewed the above documentation for accuracy and completeness, and I agree with the above. Abby Potash, PA-C

## 2021-05-09 ENCOUNTER — Other Ambulatory Visit: Payer: Self-pay | Admitting: Family Medicine

## 2021-05-09 DIAGNOSIS — Z1231 Encounter for screening mammogram for malignant neoplasm of breast: Secondary | ICD-10-CM

## 2021-05-13 ENCOUNTER — Other Ambulatory Visit: Payer: Self-pay | Admitting: Family Medicine

## 2021-05-13 DIAGNOSIS — M170 Bilateral primary osteoarthritis of knee: Secondary | ICD-10-CM

## 2021-05-19 ENCOUNTER — Encounter (INDEPENDENT_AMBULATORY_CARE_PROVIDER_SITE_OTHER): Payer: Self-pay

## 2021-05-21 ENCOUNTER — Other Ambulatory Visit: Payer: Self-pay

## 2021-05-21 ENCOUNTER — Encounter (INDEPENDENT_AMBULATORY_CARE_PROVIDER_SITE_OTHER): Payer: Self-pay | Admitting: Family Medicine

## 2021-05-21 ENCOUNTER — Ambulatory Visit (INDEPENDENT_AMBULATORY_CARE_PROVIDER_SITE_OTHER): Payer: Medicaid Other | Admitting: Family Medicine

## 2021-05-21 ENCOUNTER — Ambulatory Visit (INDEPENDENT_AMBULATORY_CARE_PROVIDER_SITE_OTHER): Payer: Medicaid Other | Admitting: Physician Assistant

## 2021-05-21 VITALS — BP 100/66 | HR 74 | Temp 98.3°F | Ht 60.0 in | Wt 215.0 lb

## 2021-05-21 DIAGNOSIS — Z6841 Body Mass Index (BMI) 40.0 and over, adult: Secondary | ICD-10-CM | POA: Diagnosis not present

## 2021-05-21 DIAGNOSIS — E8881 Metabolic syndrome: Secondary | ICD-10-CM | POA: Diagnosis not present

## 2021-05-21 DIAGNOSIS — E559 Vitamin D deficiency, unspecified: Secondary | ICD-10-CM | POA: Diagnosis not present

## 2021-05-21 DIAGNOSIS — E88819 Insulin resistance, unspecified: Secondary | ICD-10-CM

## 2021-05-21 MED ORDER — VITAMIN D (ERGOCALCIFEROL) 1.25 MG (50000 UNIT) PO CAPS
50000.0000 [IU] | ORAL_CAPSULE | ORAL | 0 refills | Status: DC
Start: 2021-05-21 — End: 2021-06-12

## 2021-05-21 NOTE — Progress Notes (Signed)
Chief Complaint:   OBESITY April [name] is here to discuss her progress with her obesity treatment plan along with follow-up of her obesity related diagnoses. April [name] is on keeping a food journal and adhering to recommended goals of 1100-1200 calories and 85 grams of protein and states she is following her eating plan approximately 80% of the time. April [name] states she is stretching and stand up push up for 30 minutes 3 times per week.  Today's visit was #: 18 Starting weight: 256 lbs Starting date: 06/13/2019 Today's weight: 215 lbs Today's date: 05/21/2021 Total lbs lost to date: 41 lbs Total lbs lost since last in-office visit: 0  Interim History: April [name] is journaling consistently. She is meeting calorie and protein goals most days. She journals on paper. She is up 4 lbs of water. Her first weight loss goal is to be less than 200 lbs. She is working on Designer, fashion/clothing intake.  She has knee issues with left knee which prevents much weight bearing exercise.  Subjective:   1. Vitamin D deficiency April [name]'s Vitamin D is at goal. She is on weekly prescription Vitamin D.  Lab Results  Component Value Date   VD25OH 65.2 04/07/2021   VD25OH 61.9 11/06/2020   VD25OH 56.9 07/10/2020    2. Insulin resistance We checked April [name]'s A1C in June and got an incorrect value (7.6). It was 5.5 at recheck.  Lab Results  Component Value Date   INSULIN 3.6 04/07/2021   INSULIN 13.8 11/06/2020   INSULIN 11.6 07/10/2020   INSULIN 7.5 03/20/2020   INSULIN 8.6 10/24/2019   Lab Results  Component Value Date   HGBA1C 5.5 04/29/2021    Assessment/Plan:   1. Vitamin D deficiency  We will refill prescription Vitamin D 50,000 IU every week for 1 month with no refills and Indyah will follow-up for routine testing of Vitamin D, at least 2-3 times per year to avoid over-replacement.  - Vitamin D, Ergocalciferol, (DRISDOL) 1.25 MG (50000 UNIT) CAPS capsule; Take 1 capsule (50,000 Units total)  by mouth every 7 (seven) days.  Dispense: 4 capsule; Refill: 0  2. Insulin resistance April [name] will continue meal plan. She will continue to work on weight loss, exercise, and decreasing simple carbohydrates to help decrease the risk of diabetes. April [name] agreed to follow-up with [place] as directed to closely monitor her progress.   3. Obesity: Current BMI 41.99 April [name] is currently in the action stage of change. As such, her goal is to continue with weight loss efforts. She has agreed to keeping a food journal and adhering to recommended goals of 1100-1200 calories and 85 grams of protein.   Exercise goals:  April [name] will increase cardio.  Behavioral modification strategies: increasing lean protein intake.  April [name] has agreed to follow-up with our clinic in 3 weeks.  Objective:   Blood pressure 100/66, pulse 74, temperature 98.3 F (36.8 C), height 5' (1.524 m), weight 215 lb (97.5 kg), SpO2 96 %. Body mass index is 41.99 kg/m.  General: Cooperative, alert, well developed, in no acute distress. HEENT: Conjunctivae and lids unremarkable. Cardiovascular: Regular rhythm.  Lungs: Normal work of breathing. Neurologic: No focal deficits.   Lab Results  Component Value Date   CREATININE 0.80 04/07/2021   BUN 17 04/07/2021   NA 139 04/07/2021   K 4.0 04/07/2021   CL 95 (L) 04/07/2021   CO2 25 04/07/2021   Lab Results  Component Value Date   ALT 12 04/07/2021   AST 18 04/07/2021   ALKPHOS  146 (H) 04/07/2021   BILITOT 0.3 04/07/2021   Lab Results  Component Value Date   HGBA1C 5.5 04/29/2021   HGBA1C 7.3 (H) 04/07/2021   HGBA1C 5.3 11/06/2020   HGBA1C 5.4 07/10/2020   HGBA1C 5.5 03/20/2020   Lab Results  Component Value Date   INSULIN 3.6 04/07/2021   INSULIN 13.8 11/06/2020   INSULIN 11.6 07/10/2020   INSULIN 7.5 03/20/2020   INSULIN 8.6 10/24/2019   Lab Results  Component Value Date   TSH 1.420 06/01/2018   Lab Results  Component Value Date   CHOL 215 (H)  04/07/2021   HDL 72 04/07/2021   LDLCALC 129 (H) 04/07/2021   LDLDIRECT 153 (H) 08/16/2020   TRIG 81 04/07/2021   CHOLHDL 3.0 04/07/2021   Lab Results  Component Value Date   VD25OH 65.2 04/07/2021   VD25OH 61.9 11/06/2020   VD25OH 56.9 07/10/2020   Lab Results  Component Value Date   WBC 6.4 10/24/2019   HGB 15.1 10/24/2019   HCT 45.4 10/24/2019   MCV 85 10/24/2019   PLT 354 10/24/2019   Lab Results  Component Value Date   IRON 36 (L) 12/11/2009   TIBC 318 12/11/2009   FERRITIN 19 12/11/2009   Attestation Statements:   Reviewed by clinician on day of visit: allergies, medications, problem list, medical history, surgical history, family history, social history, and previous encounter notes.  I, Lizbeth Bark, RMA, am acting as Location manager for [employer], [place].   I have reviewed the above documentation for accuracy and completeness, and I agree with the above. -  Georgianne Fick, FNP

## 2021-06-09 DIAGNOSIS — H5213 Myopia, bilateral: Secondary | ICD-10-CM | POA: Diagnosis not present

## 2021-06-12 ENCOUNTER — Encounter (INDEPENDENT_AMBULATORY_CARE_PROVIDER_SITE_OTHER): Payer: Self-pay | Admitting: Family Medicine

## 2021-06-12 ENCOUNTER — Other Ambulatory Visit: Payer: Self-pay

## 2021-06-12 ENCOUNTER — Ambulatory Visit (INDEPENDENT_AMBULATORY_CARE_PROVIDER_SITE_OTHER): Payer: Medicaid Other | Admitting: Family Medicine

## 2021-06-12 VITALS — BP 105/73 | HR 62 | Temp 98.2°F | Ht 60.0 in | Wt 218.0 lb

## 2021-06-12 DIAGNOSIS — E559 Vitamin D deficiency, unspecified: Secondary | ICD-10-CM

## 2021-06-12 DIAGNOSIS — I10 Essential (primary) hypertension: Secondary | ICD-10-CM | POA: Diagnosis not present

## 2021-06-12 DIAGNOSIS — F3289 Other specified depressive episodes: Secondary | ICD-10-CM | POA: Diagnosis not present

## 2021-06-12 DIAGNOSIS — Z6841 Body Mass Index (BMI) 40.0 and over, adult: Secondary | ICD-10-CM | POA: Diagnosis not present

## 2021-06-12 MED ORDER — HYDROCHLOROTHIAZIDE 25 MG PO TABS: 25.0000 mg | ORAL_TABLET | Freq: Every day | ORAL | 0 refills | Status: DC

## 2021-06-12 MED ORDER — VITAMIN D (ERGOCALCIFEROL) 1.25 MG (50000 UNIT) PO CAPS: 50000.0000 [IU] | ORAL_CAPSULE | ORAL | 0 refills | Status: DC

## 2021-06-17 ENCOUNTER — Encounter (INDEPENDENT_AMBULATORY_CARE_PROVIDER_SITE_OTHER): Payer: Self-pay | Admitting: Family Medicine

## 2021-06-17 NOTE — Progress Notes (Signed)
Chief Complaint:   OBESITY April [name] is here to discuss her progress with her obesity treatment plan along with follow-up of her obesity related diagnoses. April [name] is on keeping a food journal and adhering to recommended goals of 1100-1200 calories and 85 grams of protein and states she is following her eating plan approximately 80% of the time. April [name] states she is walking for  15-30 minutes 3 times per week.  Today's visit was #: 67 Starting weight: 256 lbs Starting date: 06/13/2019 Today's weight: 218 lbs Today's date: 06/12/2021 Total lbs lost to date: 38 lbs Total lbs lost since last in-office visit: 0  Interim History: April [name] is journaling consistently but exceeding calorie goals sometimes -up to 1400 calories. She is meeting protein goal most days. She feels hunger is satisfied but she has cravings.  Subjective:   1. Other depression April [name] notes some stress recently which has caused her to snack more.  2. Essential hypertension April [name]'s blood pressure is low today. She is on HCTZ only. She denies orthostatic symptoms.  BP Readings from Last 3 Encounters:  06/12/21 105/73  05/21/21 100/66  04/28/21 104/69    3. Vitamin D deficiency April [name]'s Vitamin D is at goal 65.2. She is on weekly prescription Vitamin D.  Lab Results  Component Value Date   VD25OH 65.2 04/07/2021   VD25OH 61.9 11/06/2020   VD25OH 56.9 07/10/2020    Assessment/Plan:   1. Other depression April [name] declined referral to Dr. Mallie Mussel.  2. Essential hypertension We will watch for signs of hypotension as she continues her lifestyle modifications. We will refill HCTZ 25 mg daily 90 day supply with no refills.  - hydrochlorothiazide (HYDRODIURIL) 25 MG tablet; Take 1 tablet (25 mg total) by mouth daily.  Dispense: 90 tablet; Refill: 0  3. Vitamin D deficiency We will refill prescription Vitamin D 50,000 IU every week for 1 month with no refills and April [name] will follow-up for routine  testing of Vitamin D, at least 2-3 times per year to avoid over-replacement.  - Vitamin D, Ergocalciferol, (DRISDOL) 1.25 MG (50000 UNIT) CAPS capsule; Take 1 capsule (50,000 Units total) by mouth every 7 (seven) days.  Dispense: 4 capsule; Refill: 0  4. Obesity with current BMI of  April [name] is currently in the action stage of change. As such, her goal is to continue with weight loss efforts. She has agreed to keeping a food journal and adhering to recommended goals of 1100-1200 calories and 80 grams of protein daily.  Exercise goals:  As is.  Behavioral modification strategies: decreasing simple carbohydrates and better snacking choices.  April [name] has agreed to follow-up with our clinic in 3 weeks.   Objective:   Blood pressure 105/73, pulse 62, temperature 98.2 F (36.8 C), height 5' (1.524 m), weight 218 lb (98.9 kg), SpO2 97 %. Body mass index is 42.58 kg/m.  General: Cooperative, alert, well developed, in no acute distress. HEENT: Conjunctivae and lids unremarkable. Cardiovascular: Regular rhythm.  Lungs: Normal work of breathing. Neurologic: No focal deficits.   Lab Results  Component Value Date   CREATININE 0.80 04/07/2021   BUN 17 04/07/2021   NA 139 04/07/2021   K 4.0 04/07/2021   CL 95 (L) 04/07/2021   CO2 25 04/07/2021   Lab Results  Component Value Date   ALT 12 04/07/2021   AST 18 04/07/2021   ALKPHOS 146 (H) 04/07/2021   BILITOT 0.3 04/07/2021   Lab Results  Component Value Date   HGBA1C 5.5 04/29/2021   HGBA1C  7.3 (H) 04/07/2021   HGBA1C 5.3 11/06/2020   HGBA1C 5.4 07/10/2020   HGBA1C 5.5 03/20/2020   Lab Results  Component Value Date   INSULIN 3.6 04/07/2021   INSULIN 13.8 11/06/2020   INSULIN 11.6 07/10/2020   INSULIN 7.5 03/20/2020   INSULIN 8.6 10/24/2019   Lab Results  Component Value Date   TSH 1.420 06/01/2018   Lab Results  Component Value Date   CHOL 215 (H) 04/07/2021   HDL 72 04/07/2021   LDLCALC 129 (H) 04/07/2021    LDLDIRECT 153 (H) 08/16/2020   TRIG 81 04/07/2021   CHOLHDL 3.0 04/07/2021   Lab Results  Component Value Date   VD25OH 65.2 04/07/2021   VD25OH 61.9 11/06/2020   VD25OH 56.9 07/10/2020   Lab Results  Component Value Date   WBC 6.4 10/24/2019   HGB 15.1 10/24/2019   HCT 45.4 10/24/2019   MCV 85 10/24/2019   PLT 354 10/24/2019   Lab Results  Component Value Date   IRON 36 (L) 12/11/2009   TIBC 318 12/11/2009   FERRITIN 19 12/11/2009    Attestation Statements:   Reviewed by clinician on day of visit: allergies, medications, problem list, medical history, surgical history, family history, social history, and previous encounter notes.  I, Lizbeth Bark, RMA, am acting as Location manager for [employer], [place].   I have reviewed the above documentation for accuracy and completeness, and I agree with the above. -  Georgianne Fick, FNP

## 2021-06-24 ENCOUNTER — Encounter (INDEPENDENT_AMBULATORY_CARE_PROVIDER_SITE_OTHER): Payer: Self-pay | Admitting: Physician Assistant

## 2021-07-01 ENCOUNTER — Other Ambulatory Visit: Payer: Self-pay | Admitting: Family Medicine

## 2021-07-01 ENCOUNTER — Other Ambulatory Visit: Payer: Self-pay

## 2021-07-01 ENCOUNTER — Ambulatory Visit
Admission: RE | Admit: 2021-07-01 | Discharge: 2021-07-01 | Disposition: A | Discharge status: Home / Self Care | Payer: Medicaid Other | Source: Ambulatory Visit | Attending: Family Medicine | Admitting: Family Medicine

## 2021-07-01 DIAGNOSIS — Z1231 Encounter for screening mammogram for malignant neoplasm of breast: Secondary | ICD-10-CM | POA: Diagnosis not present

## 2021-07-01 DIAGNOSIS — M170 Bilateral primary osteoarthritis of knee: Secondary | ICD-10-CM

## 2021-07-07 ENCOUNTER — Ambulatory Visit (INDEPENDENT_AMBULATORY_CARE_PROVIDER_SITE_OTHER): Payer: Medicaid Other | Admitting: Family Medicine

## 2021-07-08 ENCOUNTER — Other Ambulatory Visit: Payer: Self-pay | Admitting: Family Medicine

## 2021-07-08 DIAGNOSIS — R928 Other abnormal and inconclusive findings on diagnostic imaging of breast: Secondary | ICD-10-CM

## 2021-07-09 ENCOUNTER — Other Ambulatory Visit: Payer: Self-pay

## 2021-07-09 ENCOUNTER — Ambulatory Visit (INDEPENDENT_AMBULATORY_CARE_PROVIDER_SITE_OTHER): Payer: Medicaid Other | Admitting: Physician Assistant

## 2021-07-09 ENCOUNTER — Encounter (INDEPENDENT_AMBULATORY_CARE_PROVIDER_SITE_OTHER): Payer: Self-pay | Admitting: Physician Assistant

## 2021-07-09 VITALS — BP 108/66 | HR 75 | Temp 97.7°F | Ht 60.0 in | Wt 215.0 lb

## 2021-07-09 DIAGNOSIS — E559 Vitamin D deficiency, unspecified: Secondary | ICD-10-CM

## 2021-07-09 DIAGNOSIS — I10 Essential (primary) hypertension: Secondary | ICD-10-CM

## 2021-07-09 DIAGNOSIS — Z6841 Body Mass Index (BMI) 40.0 and over, adult: Secondary | ICD-10-CM | POA: Diagnosis not present

## 2021-07-09 MED ORDER — HYDROCHLOROTHIAZIDE 25 MG PO TABS: 25.0000 mg | ORAL_TABLET | Freq: Every day | ORAL | 0 refills | Status: DC

## 2021-07-09 MED ORDER — VITAMIN D (ERGOCALCIFEROL) 1.25 MG (50000 UNIT) PO CAPS: 50000.0000 [IU] | ORAL_CAPSULE | ORAL | 0 refills | Status: DC

## 2021-07-09 NOTE — Progress Notes (Signed)
Chief Complaint:   OBESITY April [name] is here to discuss her progress with her obesity treatment plan along with follow-up of her obesity related diagnoses. April [name] is on keeping a food journal and adhering to recommended goals of 1100-1200 calories and 80 grams of protein daily and states she is following her eating plan approximately 85% of the time. April [name] states she is walking for 30-40 minutes 3-4 times per week.  Today's visit was #: 32 Starting weight: 256 lbs Starting date: 06/13/2019 Today's weight: 215 lbs Today's date: 07/09/2021 Total lbs lost to date: 41 Total lbs lost since last in-office visit: 3  Interim History: April [name] has been journaling consistently and hitting her calorie and protein goals most days. She reports that she is feeling better overall and her energy is better.  Subjective:   1. Essential hypertension April [name] is on hydrochlorothiazide. Her blood pressure is controlled and she denies chest pain or headache.  2. Vitamin D deficiency April [name] is on Vit D, and she denies nausea, vomiting, or muscle weakness.  Assessment/Plan:   1. Essential hypertension April [name] will continue working on healthy weight loss and exercise to improve blood pressure control. We will refill hydrochlorothiazide for 90 days with no refills. She will watch for signs of hypotension as she continues her lifestyle modifications.  - hydrochlorothiazide (HYDRODIURIL) 25 MG tablet; Take 1 tablet (25 mg total) by mouth daily.  Dispense: 90 tablet; Refill: 0  2. Vitamin D deficiency Low Vitamin D level contributes to fatigue and are associated with obesity, breast, and colon cancer. We will refill prescription Vitamin D for 1 month. April [name] will follow-up for routine testing of Vitamin D, at least 2-3 times per year to avoid over-replacement.  - Vitamin D, Ergocalciferol, (DRISDOL) 1.25 MG (50000 UNIT) CAPS capsule; Take 1 capsule (50,000 Units total) by mouth every 7 (seven)  days.  Dispense: 4 capsule; Refill: 0  3. Obesity with current BMI of 41.99 April [name] is currently in the action stage of change. As such, her goal is to continue with weight loss efforts. She has agreed to keeping a food journal and adhering to recommended goals of 1100-1200 calories and 80 grams of protein daily.   Exercise goals: As is.  Behavioral modification strategies: planning for success and keeping a strict food journal.  April [name] has agreed to follow-up with our clinic in 3 weeks. She was informed of the importance of frequent follow-up visits to maximize her success with intensive lifestyle modifications for her multiple health conditions.   Objective:   Blood pressure 108/66, pulse 75, temperature 97.7 F (36.5 C), height 5' (1.524 m), weight 215 lb (97.5 kg), SpO2 98 %. Body mass index is 41.99 kg/m.  General: Cooperative, alert, well developed, in no acute distress. HEENT: Conjunctivae and lids unremarkable. Cardiovascular: Regular rhythm.  Lungs: Normal work of breathing. Neurologic: No focal deficits.   Lab Results  Component Value Date   CREATININE 0.80 04/07/2021   BUN 17 04/07/2021   NA 139 04/07/2021   K 4.0 04/07/2021   CL 95 (L) 04/07/2021   CO2 25 04/07/2021   Lab Results  Component Value Date   ALT 12 04/07/2021   AST 18 04/07/2021   ALKPHOS 146 (H) 04/07/2021   BILITOT 0.3 04/07/2021   Lab Results  Component Value Date   HGBA1C 5.5 04/29/2021   HGBA1C 7.3 (H) 04/07/2021   HGBA1C 5.3 11/06/2020   HGBA1C 5.4 07/10/2020   HGBA1C 5.5 03/20/2020   Lab Results  Component Value Date  INSULIN 3.6 04/07/2021   INSULIN 13.8 11/06/2020   INSULIN 11.6 07/10/2020   INSULIN 7.5 03/20/2020   INSULIN 8.6 10/24/2019   Lab Results  Component Value Date   TSH 1.420 06/01/2018   Lab Results  Component Value Date   CHOL 215 (H) 04/07/2021   HDL 72 04/07/2021   LDLCALC 129 (H) 04/07/2021   LDLDIRECT 153 (H) 08/16/2020   TRIG 81 04/07/2021    CHOLHDL 3.0 04/07/2021   Lab Results  Component Value Date   VD25OH 65.2 04/07/2021   VD25OH 61.9 11/06/2020   VD25OH 56.9 07/10/2020   Lab Results  Component Value Date   WBC 6.4 10/24/2019   HGB 15.1 10/24/2019   HCT 45.4 10/24/2019   MCV 85 10/24/2019   PLT 354 10/24/2019   Lab Results  Component Value Date   IRON 36 (L) 12/11/2009   TIBC 318 12/11/2009   FERRITIN 19 12/11/2009   Attestation Statements:   Reviewed by clinician on day of visit: allergies, medications, problem list, medical history, surgical history, family history, social history, and previous encounter notes.   Wilhemena Durie, am acting as transcriptionist for [employer], PA-C.  I have reviewed the above documentation for accuracy and completeness, and I agree with the above. Abby Potash, PA-C

## 2021-07-29 ENCOUNTER — Ambulatory Visit (INDEPENDENT_AMBULATORY_CARE_PROVIDER_SITE_OTHER): Payer: Medicaid Other | Admitting: Physician Assistant

## 2021-08-05 DIAGNOSIS — M1712 Unilateral primary osteoarthritis, left knee: Secondary | ICD-10-CM | POA: Diagnosis not present

## 2021-08-07 ENCOUNTER — Encounter (INDEPENDENT_AMBULATORY_CARE_PROVIDER_SITE_OTHER): Payer: Self-pay | Admitting: Physician Assistant

## 2021-08-07 ENCOUNTER — Other Ambulatory Visit: Payer: Self-pay

## 2021-08-07 ENCOUNTER — Ambulatory Visit (INDEPENDENT_AMBULATORY_CARE_PROVIDER_SITE_OTHER): Payer: Medicaid Other | Admitting: Physician Assistant

## 2021-08-07 VITALS — BP 131/86 | HR 64 | Temp 98.5°F | Ht 60.0 in | Wt 216.0 lb

## 2021-08-07 DIAGNOSIS — E559 Vitamin D deficiency, unspecified: Secondary | ICD-10-CM

## 2021-08-07 DIAGNOSIS — Z6841 Body Mass Index (BMI) 40.0 and over, adult: Secondary | ICD-10-CM

## 2021-08-07 MED ORDER — VITAMIN D (ERGOCALCIFEROL) 1.25 MG (50000 UNIT) PO CAPS: 50000.0000 [IU] | ORAL_CAPSULE | ORAL | 0 refills | Status: DC

## 2021-08-07 NOTE — Progress Notes (Signed)
Chief Complaint:   OBESITY April [name] is here to discuss her progress with her obesity treatment plan along with follow-up of her obesity related diagnoses. April [name] is on keeping a food journal and adhering to recommended goals of 1100-1200 calories and 80 grams protein and states she is following her eating plan approximately 80% of the time. [name] states she is walking 15-30 minutes 2-3 times per week.  Today's visit was #: 88 Starting weight: 256 lbs Starting date: 06/13/2019 Today's weight: 216 lbs Today's date: 08/07/2021 Total lbs lost to date: 40 Total lbs lost since last in-office visit: 0  Interim History: April [name] has had a stressful last few weeks and has had times she did not focus on her plan. She continues to journal daily.  Subjective:   1. Vitamin D deficiency April [name] is on Vit D weekly and her last level was at goal.  Assessment/Plan:   1. Vitamin D deficiency Low Vitamin D level contributes to fatigue and are associated with obesity, breast, and colon cancer. She agrees to continue to take prescription Vitamin D 50,000 IU every week and will follow-up for routine testing of Vitamin D, at least 2-3 times per year to avoid over-replacement.  Refill- Vitamin D, Ergocalciferol, (DRISDOL) 1.25 MG (50000 UNIT) CAPS capsule; Take 1 capsule (50,000 Units total) by mouth every 7 (seven) days.  Dispense: 4 capsule; Refill: 0  2. Obesity with current BMI of 42.18  April [name] is currently in the action stage of change. As such, her goal is to continue with weight loss efforts. She has agreed to keeping a food journal and adhering to recommended goals of 1100-1200 calories and 80 grams protein.   Exercise goals:  As is  Behavioral modification strategies: meal planning and cooking strategies.  April [name] has agreed to follow-up with our clinic in 3 weeks. She was informed of the importance of frequent follow-up visits to maximize her success with intensive lifestyle  modifications for her multiple health conditions.   Objective:   Blood pressure 131/86, pulse 64, temperature 98.5 F (36.9 C), height 5' (1.524 m), weight 216 lb (98 kg), SpO2 97 %. Body mass index is 42.18 kg/m.  General: Cooperative, alert, well developed, in no acute distress. HEENT: Conjunctivae and lids unremarkable. Cardiovascular: Regular rhythm.  Lungs: Normal work of breathing. Neurologic: No focal deficits.   Lab Results  Component Value Date   CREATININE 0.80 04/07/2021   BUN 17 04/07/2021   NA 139 04/07/2021   K 4.0 04/07/2021   CL 95 (L) 04/07/2021   CO2 25 04/07/2021   Lab Results  Component Value Date   ALT 12 04/07/2021   AST 18 04/07/2021   ALKPHOS 146 (H) 04/07/2021   BILITOT 0.3 04/07/2021   Lab Results  Component Value Date   HGBA1C 5.5 04/29/2021   HGBA1C 7.3 (H) 04/07/2021   HGBA1C 5.3 11/06/2020   HGBA1C 5.4 07/10/2020   HGBA1C 5.5 03/20/2020   Lab Results  Component Value Date   INSULIN 3.6 04/07/2021   INSULIN 13.8 11/06/2020   INSULIN 11.6 07/10/2020   INSULIN 7.5 03/20/2020   INSULIN 8.6 10/24/2019   Lab Results  Component Value Date   TSH 1.420 06/01/2018   Lab Results  Component Value Date   CHOL 215 (H) 04/07/2021   HDL 72 04/07/2021   LDLCALC 129 (H) 04/07/2021   LDLDIRECT 153 (H) 08/16/2020   TRIG 81 04/07/2021   CHOLHDL 3.0 04/07/2021   Lab Results  Component Value Date   VD25OH  65.2 04/07/2021   VD25OH 61.9 11/06/2020   VD25OH 56.9 07/10/2020   Lab Results  Component Value Date   WBC 6.4 10/24/2019   HGB 15.1 10/24/2019   HCT 45.4 10/24/2019   MCV 85 10/24/2019   PLT 354 10/24/2019   Lab Results  Component Value Date   IRON 36 (L) 12/11/2009   TIBC 318 12/11/2009   FERRITIN 19 12/11/2009   Attestation Statements:   Reviewed by clinician on day of visit: allergies, medications, problem list, medical history, surgical history, family history, social history, and previous encounter notes.  Coral Ceo, CMA, am acting as transcriptionist for [employer], PA-C.  I have reviewed the above documentation for accuracy and completeness, and I agree with the above. Abby Potash, PA-C

## 2021-08-12 ENCOUNTER — Other Ambulatory Visit: Payer: Self-pay | Admitting: Family Medicine

## 2021-08-12 ENCOUNTER — Ambulatory Visit
Admission: RE | Admit: 2021-08-12 | Discharge: 2021-08-12 | Disposition: A | Discharge status: Home / Self Care | Payer: Medicaid Other | Source: Ambulatory Visit | Attending: Family Medicine | Admitting: Family Medicine

## 2021-08-12 ENCOUNTER — Other Ambulatory Visit: Payer: Self-pay

## 2021-08-12 DIAGNOSIS — N6489 Other specified disorders of breast: Secondary | ICD-10-CM

## 2021-08-12 DIAGNOSIS — R928 Other abnormal and inconclusive findings on diagnostic imaging of breast: Secondary | ICD-10-CM

## 2021-08-12 DIAGNOSIS — N644 Mastodynia: Secondary | ICD-10-CM | POA: Diagnosis not present

## 2021-08-12 DIAGNOSIS — N6321 Unspecified lump in the left breast, upper outer quadrant: Secondary | ICD-10-CM | POA: Diagnosis not present

## 2021-08-12 DIAGNOSIS — R922 Inconclusive mammogram: Secondary | ICD-10-CM | POA: Diagnosis not present

## 2021-08-27 ENCOUNTER — Ambulatory Visit (INDEPENDENT_AMBULATORY_CARE_PROVIDER_SITE_OTHER): Payer: Medicaid Other | Admitting: Physician Assistant

## 2021-08-27 ENCOUNTER — Other Ambulatory Visit: Payer: Self-pay

## 2021-08-27 ENCOUNTER — Encounter (INDEPENDENT_AMBULATORY_CARE_PROVIDER_SITE_OTHER): Payer: Self-pay | Admitting: Physician Assistant

## 2021-08-27 VITALS — BP 110/74 | HR 65 | Temp 98.4°F | Ht <= 58 in | Wt 216.0 lb

## 2021-08-27 DIAGNOSIS — Z6841 Body Mass Index (BMI) 40.0 and over, adult: Secondary | ICD-10-CM

## 2021-08-27 DIAGNOSIS — E559 Vitamin D deficiency, unspecified: Secondary | ICD-10-CM | POA: Diagnosis not present

## 2021-08-27 DIAGNOSIS — E66813 Obesity, class 3: Secondary | ICD-10-CM

## 2021-08-27 MED ORDER — VITAMIN D (ERGOCALCIFEROL) 1.25 MG (50000 UNIT) PO CAPS: 50000.0000 [IU] | ORAL_CAPSULE | ORAL | 0 refills | Status: DC

## 2021-08-27 NOTE — Progress Notes (Signed)
Chief Complaint:   OBESITY April [name] is here to discuss her progress with her obesity treatment plan along with follow-up of her obesity related diagnoses. April [name] is on keeping a food journal and adhering to recommended goals of 1100-1200 calories and 80 grams of protein daily and states she is following her eating plan approximately 80% of the time. April [name] states she is walking and doing push ups for 15 minutes 2-3 times per week.  Today's visit was #: 23 Starting weight: 256 lbs Starting date: 06/13/2019 Today's weight: 216 lbs Today's date: 08/27/2021 Total lbs lost to date: 40 Total lbs lost since last in-office visit: 0  Interim History: April [name] has had a lot of ups and downs emotionally over the last few weeks. She has a good support system in place. She is journaling consistently but she is not always hitting her protein or calorie goals.  Subjective:   1. Vitamin D deficiency April [name]'s last Vit D level was 65.2. She is on Vit D weekly, and she is taking it as prescribed.  Assessment/Plan:   1. Vitamin D deficiency Low Vitamin D level contributes to fatigue and are associated with obesity, breast, and colon cancer. April [name] will continue prescription Vitamin D 50,000 IU every week and we will refill for 1 month. She will follow-up for routine testing of Vitamin D, at least 2-3 times per year to avoid over-replacement.  - Vitamin D, Ergocalciferol, (DRISDOL) 1.25 MG (50000 UNIT) CAPS capsule; Take 1 capsule (50,000 Units total) by mouth every 7 (seven) days.  Dispense: 4 capsule; Refill: 0  2. Obesity with current BMI of 42.18 April [name] is currently in the action stage of change. As such, her goal is to continue with weight loss efforts. She has agreed to keeping a food journal and adhering to recommended goals of 1100-1200 calories and 80 grams of protein daily.   Exercise goals: As is.  Behavioral modification strategies: meal planning and cooking strategies and  keeping a strict food journal.  April [name] has agreed to follow-up with our clinic in 3 weeks. She was informed of the importance of frequent follow-up visits to maximize her success with intensive lifestyle modifications for her multiple health conditions.   Objective:   Blood pressure 110/74, pulse 65, temperature 98.4 F (36.9 C), height (!) 5" (0.127 m), weight 216 lb (98 kg), SpO2 99 %. Body mass index is 6,074.58 kg/m.  General: Cooperative, alert, well developed, in no acute distress. HEENT: Conjunctivae and lids unremarkable. Cardiovascular: Regular rhythm.  Lungs: Normal work of breathing. Neurologic: No focal deficits.   Lab Results  Component Value Date   CREATININE 0.80 04/07/2021   BUN 17 04/07/2021   NA 139 04/07/2021   K 4.0 04/07/2021   CL 95 (L) 04/07/2021   CO2 25 04/07/2021   Lab Results  Component Value Date   ALT 12 04/07/2021   AST 18 04/07/2021   ALKPHOS 146 (H) 04/07/2021   BILITOT 0.3 04/07/2021   Lab Results  Component Value Date   HGBA1C 5.5 04/29/2021   HGBA1C 7.3 (H) 04/07/2021   HGBA1C 5.3 11/06/2020   HGBA1C 5.4 07/10/2020   HGBA1C 5.5 03/20/2020   Lab Results  Component Value Date   INSULIN 3.6 04/07/2021   INSULIN 13.8 11/06/2020   INSULIN 11.6 07/10/2020   INSULIN 7.5 03/20/2020   INSULIN 8.6 10/24/2019   Lab Results  Component Value Date   TSH 1.420 06/01/2018   Lab Results  Component Value Date   CHOL 215 (H)  04/07/2021   HDL 72 04/07/2021   LDLCALC 129 (H) 04/07/2021   LDLDIRECT 153 (H) 08/16/2020   TRIG 81 04/07/2021   CHOLHDL 3.0 04/07/2021   Lab Results  Component Value Date   VD25OH 65.2 04/07/2021   VD25OH 61.9 11/06/2020   VD25OH 56.9 07/10/2020   Lab Results  Component Value Date   WBC 6.4 10/24/2019   HGB 15.1 10/24/2019   HCT 45.4 10/24/2019   MCV 85 10/24/2019   PLT 354 10/24/2019   Lab Results  Component Value Date   IRON 36 (L) 12/11/2009   TIBC 318 12/11/2009   FERRITIN 19 12/11/2009    Attestation Statements:   Reviewed by clinician on day of visit: allergies, medications, problem list, medical history, surgical history, family history, social history, and previous encounter notes.   April [name], am acting as transcriptionist for [employer], PA-C.  I have reviewed the above documentation for accuracy and completeness, and I agree with the above. Abby Potash, PA-C

## 2021-09-05 ENCOUNTER — Other Ambulatory Visit: Payer: Self-pay | Admitting: Family Medicine

## 2021-09-05 DIAGNOSIS — M170 Bilateral primary osteoarthritis of knee: Secondary | ICD-10-CM

## 2021-09-16 ENCOUNTER — Other Ambulatory Visit (HOSPITAL_COMMUNITY)
Admission: RE | Admit: 2021-09-16 | Discharge: 2021-09-16 | Disposition: A | Discharge status: Home / Self Care | Payer: Medicaid Other | Source: Ambulatory Visit | Attending: Family Medicine | Admitting: Family Medicine

## 2021-09-16 ENCOUNTER — Ambulatory Visit: Payer: Medicaid Other | Admitting: Family Medicine

## 2021-09-16 ENCOUNTER — Other Ambulatory Visit: Payer: Self-pay

## 2021-09-16 ENCOUNTER — Encounter: Payer: Self-pay | Admitting: Family Medicine

## 2021-09-16 VITALS — BP 125/71 | HR 81 | Ht 60.0 in | Wt 221.1 lb

## 2021-09-16 DIAGNOSIS — I10 Essential (primary) hypertension: Secondary | ICD-10-CM | POA: Diagnosis not present

## 2021-09-16 DIAGNOSIS — Z124 Encounter for screening for malignant neoplasm of cervix: Secondary | ICD-10-CM | POA: Insufficient documentation

## 2021-09-16 NOTE — Progress Notes (Signed)
    SUBJECTIVE:   CHIEF COMPLAINT / HPI:   Patient presents for PAP smear as she is due for one. She denies vaginal discharge and pruritus. Excited that she will be getting married soon but they have not set a date yet.   Hypertension Denies chest pain, dyspnea, leg swelling and palpitations. Compliant on daily HCTZ. She maintains routine follow up with healthy weight and wellness.  Eats a balanced diet which she follows the nutrition plan given by healthy weight and wellness. She also stays active throughout the day.   OBJECTIVE:   BP 125/71   Pulse 81   Ht 5' (1.524 m)   Wt 221 lb 2 oz (100.3 kg)   SpO2 99%   BMI 43.19 kg/m   General: Patient well-appearing, in no acute distress. Resp: normal work of breathing, no respiratory distress GU: normal vaginal and vulva, no rashes or external lesions, no cervical or vaginal discharge noted, no adnexal masses or tenderness noted on bimanual exam Psych: mood appropriate, very pleasant   GU exam performed in the presence of chaperone.   ASSESSMENT/PLAN:   Pap smear for cervical cancer screening -PAP performed today with HPV cotesting, will notify patient of any abnormal results -patient politely declines STI testing  Essential hypertension -BP 125/71, at goal and well-controlled -continue HCTZ therapy -continued to encourage diet and exercise healthy habits along with continued participation at weight management program -plan to follow up in 3 months for check up    Donney Dice, [place]

## 2021-09-16 NOTE — Assessment & Plan Note (Signed)
-  BP 125/71, at goal and well-controlled -continue HCTZ therapy -continued to encourage diet and exercise healthy habits along with continued participation at weight management program -plan to follow up in 3 months for check up

## 2021-09-16 NOTE — Patient Instructions (Signed)
It was great seeing you today!  Today we performed a PAP smear, I will notify you of any abnormal results.   Please follow up at your next scheduled appointment in 3 months, if anything arises between now and then, please don't hesitate to contact our office.   Thank you for allowing [place] to be a part of your medical care!  Thank you, Dr. Larae Grooms

## 2021-09-16 NOTE — Assessment & Plan Note (Signed)
-  PAP performed today with HPV cotesting, will notify patient of any abnormal results -patient politely declines STI testing

## 2021-09-18 ENCOUNTER — Encounter: Payer: Self-pay | Admitting: Family Medicine

## 2021-09-18 ENCOUNTER — Other Ambulatory Visit: Payer: Self-pay | Admitting: Family Medicine

## 2021-09-18 DIAGNOSIS — M170 Bilateral primary osteoarthritis of knee: Secondary | ICD-10-CM

## 2021-09-18 LAB — CYTOLOGY - PAP
Adequacy: ABSENT
Comment: NEGATIVE
Diagnosis: NEGATIVE
High risk HPV: NEGATIVE

## 2021-09-22 ENCOUNTER — Encounter (INDEPENDENT_AMBULATORY_CARE_PROVIDER_SITE_OTHER): Payer: Self-pay | Admitting: Physician Assistant

## 2021-09-22 ENCOUNTER — Other Ambulatory Visit: Payer: Self-pay

## 2021-09-22 ENCOUNTER — Ambulatory Visit (INDEPENDENT_AMBULATORY_CARE_PROVIDER_SITE_OTHER): Payer: Medicaid Other | Admitting: Physician Assistant

## 2021-09-22 VITALS — BP 132/83 | HR 64 | Temp 98.3°F | Ht 60.0 in | Wt 217.0 lb

## 2021-09-22 DIAGNOSIS — I10 Essential (primary) hypertension: Secondary | ICD-10-CM | POA: Diagnosis not present

## 2021-09-22 DIAGNOSIS — E559 Vitamin D deficiency, unspecified: Secondary | ICD-10-CM

## 2021-09-22 DIAGNOSIS — Z6841 Body Mass Index (BMI) 40.0 and over, adult: Secondary | ICD-10-CM

## 2021-09-22 MED ORDER — HYDROCHLOROTHIAZIDE 25 MG PO TABS: 25.0000 mg | ORAL_TABLET | Freq: Every day | ORAL | 0 refills | Status: DC

## 2021-09-22 MED ORDER — VITAMIN D (ERGOCALCIFEROL) 1.25 MG (50000 UNIT) PO CAPS: 50000.0000 [IU] | ORAL_CAPSULE | ORAL | 0 refills | Status: DC

## 2021-09-22 NOTE — Progress Notes (Signed)
Chief Complaint:   OBESITY April [name] is here to discuss her progress with her obesity treatment plan along with follow-up of her obesity related diagnoses. April [name] is on the Category 2 Plan and keeping a food journal and adhering to recommended goals of 1200 calories and 80 grams of protein and states she is following her eating plan approximately 90% of the time. April [name] states she is stretching for 20 minutes 2-3 times per week.  Today's visit was #: 52 Starting weight: 256 lbs Starting date: 06/13/2019 Today's weight: 217 lbs Today's date: 09/22/2021 Total lbs lost to date: 39 lbs Total lbs lost since last in-office visit: 0  Interim History: April [name] reports that she has been overeating her calories and under eating her protein. She has been stressed with a personal situation. She has been drinking sugar filled tea from [employer].   Subjective:   1. Vitamin D deficiency April [name] is currently on Vitamin D weekly.  2. Essential hypertension April [name] is on HCTZ 25 mg daily currently. Her blood pressure is controlled.  Assessment/Plan:   1. Vitamin D deficiency Low Vitamin D level contributes to fatigue and are associated with obesity, breast, and colon cancer. We will refill  prescription Vitamin D 50,000 IU every week for 1 month with no refills and Damian will follow-up for routine testing of Vitamin D, at least 2-3 times per year to avoid over-replacement.  - Vitamin D, Ergocalciferol, (DRISDOL) 1.25 MG (50000 UNIT) CAPS capsule; Take 1 capsule (50,000 Units total) by mouth every 7 (seven) days.  Dispense: 4 capsule; Refill: 0  2. Essential hypertension April [name] is working on healthy weight loss and exercise to improve blood pressure control. We will refill HCTZ 25 mg for 3 months with no refills. We will watch for signs of hypotension as she continues her lifestyle modifications.  - hydrochlorothiazide (HYDRODIURIL) 25 MG tablet; Take 1 tablet (25 mg total) by mouth  daily.  Dispense: 90 tablet; Refill: 0  3. Obesity with current BMI of 42.5 April [name] is currently in the action stage of change. As such, her goal is to continue with weight loss efforts. She has agreed to keeping a food journal and adhering to recommended goals of 1100-1200 calories and 80 grams of protein daily.  Exercise goals:  As is.  Behavioral modification strategies: increasing lean protein intake, decreasing simple carbohydrates, and meal planning and cooking strategies.  April [name] has agreed to follow-up with our clinic in 3 weeks. She was informed of the importance of frequent follow-up visits to maximize her success with intensive lifestyle modifications for her multiple health conditions.   Objective:   Blood pressure 132/83, pulse 64, temperature 98.3 F (36.8 C), height 5' (1.524 m), weight 217 lb (98.4 kg), SpO2 99 %. Body mass index is 42.38 kg/m.  General: Cooperative, alert, well developed, in no acute distress. HEENT: Conjunctivae and lids unremarkable. Cardiovascular: Regular rhythm.  Lungs: Normal work of breathing. Neurologic: No focal deficits.   Lab Results  Component Value Date   CREATININE 0.80 04/07/2021   BUN 17 04/07/2021   NA 139 04/07/2021   K 4.0 04/07/2021   CL 95 (L) 04/07/2021   CO2 25 04/07/2021   Lab Results  Component Value Date   ALT 12 04/07/2021   AST 18 04/07/2021   ALKPHOS 146 (H) 04/07/2021   BILITOT 0.3 04/07/2021   Lab Results  Component Value Date   HGBA1C 5.5 04/29/2021   HGBA1C 7.3 (H) 04/07/2021   HGBA1C 5.3 11/06/2020   HGBA1C  5.4 07/10/2020   HGBA1C 5.5 03/20/2020   Lab Results  Component Value Date   INSULIN 3.6 04/07/2021   INSULIN 13.8 11/06/2020   INSULIN 11.6 07/10/2020   INSULIN 7.5 03/20/2020   INSULIN 8.6 10/24/2019   Lab Results  Component Value Date   TSH 1.420 06/01/2018   Lab Results  Component Value Date   CHOL 215 (H) 04/07/2021   HDL 72 04/07/2021   LDLCALC 129 (H) 04/07/2021    LDLDIRECT 153 (H) 08/16/2020   TRIG 81 04/07/2021   CHOLHDL 3.0 04/07/2021   Lab Results  Component Value Date   VD25OH 65.2 04/07/2021   VD25OH 61.9 11/06/2020   VD25OH 56.9 07/10/2020   Lab Results  Component Value Date   WBC 6.4 10/24/2019   HGB 15.1 10/24/2019   HCT 45.4 10/24/2019   MCV 85 10/24/2019   PLT 354 10/24/2019   Lab Results  Component Value Date   IRON 36 (L) 12/11/2009   TIBC 318 12/11/2009   FERRITIN 19 12/11/2009   Attestation Statements:   Reviewed by clinician on day of visit: allergies, medications, problem list, medical history, surgical history, family history, social history, and previous encounter notes.  I, Tonye Pearson, am acting as Location manager for [employer], PA-C.   I have reviewed the above documentation for accuracy and completeness, and I agree with the above. Abby Potash, PA-C

## 2021-10-15 ENCOUNTER — Encounter (INDEPENDENT_AMBULATORY_CARE_PROVIDER_SITE_OTHER): Payer: Self-pay | Admitting: Physician Assistant

## 2021-10-15 ENCOUNTER — Other Ambulatory Visit: Payer: Self-pay

## 2021-10-15 ENCOUNTER — Ambulatory Visit (INDEPENDENT_AMBULATORY_CARE_PROVIDER_SITE_OTHER): Payer: Medicaid Other | Admitting: Physician Assistant

## 2021-10-15 VITALS — BP 121/77 | HR 74 | Temp 98.2°F | Ht 60.0 in | Wt 222.0 lb

## 2021-10-15 DIAGNOSIS — E559 Vitamin D deficiency, unspecified: Secondary | ICD-10-CM | POA: Diagnosis not present

## 2021-10-15 DIAGNOSIS — Z6841 Body Mass Index (BMI) 40.0 and over, adult: Secondary | ICD-10-CM | POA: Diagnosis not present

## 2021-10-15 MED ORDER — VITAMIN D (ERGOCALCIFEROL) 1.25 MG (50000 UNIT) PO CAPS: 50000.0000 [IU] | ORAL_CAPSULE | ORAL | 0 refills | Status: DC

## 2021-10-16 NOTE — Progress Notes (Signed)
Chief Complaint:   OBESITY April [name] is here to discuss her progress with her obesity treatment plan along with follow-up of her obesity related diagnoses. April [name] is on the Category 2 Plan and keeping a food journal and adhering to recommended goals of 1100-1200 calories and 80+ grams protein and states she is following her eating plan approximately 80% of the time. April [name] states she is walking and stretching 30 minutes 3 times per week.  Today's visit was #: 38 Starting weight: 256 lbs Starting date: 06/13/2019 Today's weight: 222 lbs Today's date: 10/15/2021 Total lbs lost to date: 34 Total lbs lost since last in-office visit: 0  Interim History: April [name] reports overindulging over the holidays. She has been doing some emotional eating with sweets. She has been overeating her calories but not eating enough protein.  Subjective:   1. Vitamin D deficiency Pt denies nausea, vomiting, and muscle weakness and is on prescription Vit D.  Assessment/Plan:   1. Vitamin D deficiency Low Vitamin D level contributes to fatigue and are associated with obesity, breast, and colon cancer. She agrees to continue to take prescription Vitamin D 50,000 IU every week and will follow-up for routine testing of Vitamin D, at least 2-3 times per year to avoid over-replacement. Check labs at next visit.  Refill- Vitamin D, Ergocalciferol, (DRISDOL) 1.25 MG (50000 UNIT) CAPS capsule; Take 1 capsule (50,000 Units total) by mouth every 7 (seven) days.  Dispense: 4 capsule; Refill: 0  2. Obesity with current BMI of 43.36  April [name] is currently in the action stage of change. As such, her goal is to continue with weight loss efforts. She has agreed to keeping a food journal and adhering to recommended goals of 1100-1200 calories and 80 grams protein.   Exercise goals:  As is  Behavioral modification strategies: meal planning and cooking strategies and keeping a strict food journal.  April [name] has agreed  to follow-up with our clinic in 3 weeks. She was informed of the importance of frequent follow-up visits to maximize her success with intensive lifestyle modifications for her multiple health conditions.   Objective:   Blood pressure 121/77, pulse 74, temperature 98.2 F (36.8 C), height 5' (1.524 m), weight 222 lb (100.7 kg), SpO2 99 %. Body mass index is 43.36 kg/m.  General: Cooperative, alert, well developed, in no acute distress. HEENT: Conjunctivae and lids unremarkable. Cardiovascular: Regular rhythm.  Lungs: Normal work of breathing. Neurologic: No focal deficits.   Lab Results  Component Value Date   CREATININE 0.80 04/07/2021   BUN 17 04/07/2021   NA 139 04/07/2021   K 4.0 04/07/2021   CL 95 (L) 04/07/2021   CO2 25 04/07/2021   Lab Results  Component Value Date   ALT 12 04/07/2021   AST 18 04/07/2021   ALKPHOS 146 (H) 04/07/2021   BILITOT 0.3 04/07/2021   Lab Results  Component Value Date   HGBA1C 5.5 04/29/2021   HGBA1C 7.3 (H) 04/07/2021   HGBA1C 5.3 11/06/2020   HGBA1C 5.4 07/10/2020   HGBA1C 5.5 03/20/2020   Lab Results  Component Value Date   INSULIN 3.6 04/07/2021   INSULIN 13.8 11/06/2020   INSULIN 11.6 07/10/2020   INSULIN 7.5 03/20/2020   INSULIN 8.6 10/24/2019   Lab Results  Component Value Date   TSH 1.420 06/01/2018   Lab Results  Component Value Date   CHOL 215 (H) 04/07/2021   HDL 72 04/07/2021   LDLCALC 129 (H) 04/07/2021   LDLDIRECT 153 (H) 08/16/2020  TRIG 81 04/07/2021   CHOLHDL 3.0 04/07/2021   Lab Results  Component Value Date   VD25OH 65.2 04/07/2021   VD25OH 61.9 11/06/2020   VD25OH 56.9 07/10/2020   Lab Results  Component Value Date   WBC 6.4 10/24/2019   HGB 15.1 10/24/2019   HCT 45.4 10/24/2019   MCV 85 10/24/2019   PLT 354 10/24/2019   Lab Results  Component Value Date   IRON 36 (L) 12/11/2009   TIBC 318 12/11/2009   FERRITIN 19 12/11/2009   Attestation Statements:   Reviewed by clinician on day of  visit: allergies, medications, problem list, medical history, surgical history, family history, social history, and previous encounter notes.  Coral Ceo, CMA, am acting as transcriptionist for [employer], PA-C.  I have reviewed the above documentation for accuracy and completeness, and I agree with the above. Abby Potash, PA-C

## 2021-11-01 IMAGING — MG MM DIGITAL DIAGNOSTIC UNILAT*L* W/ TOMO W/ CAD
6 series · 6 of 18 positions shown · non-contrast
Comparison: Previous exam(s).

CLINICAL DATA: 60-year-old female presenting as a recall from
screening for possible left breast asymmetry.

EXAM:
DIGITAL DIAGNOSTIC UNILATERAL LEFT MAMMOGRAM WITH TOMOSYNTHESIS AND
CAD; ULTRASOUND LEFT BREAST LIMITED
TECHNIQUE: Left digital diagnostic mammography and breast tomosynthesis was
performed. The images were evaluated with computer-aided detection.;
Targeted ultrasound examination of the left breast was performed.

[L MLO synth-2D (1 of 2)]
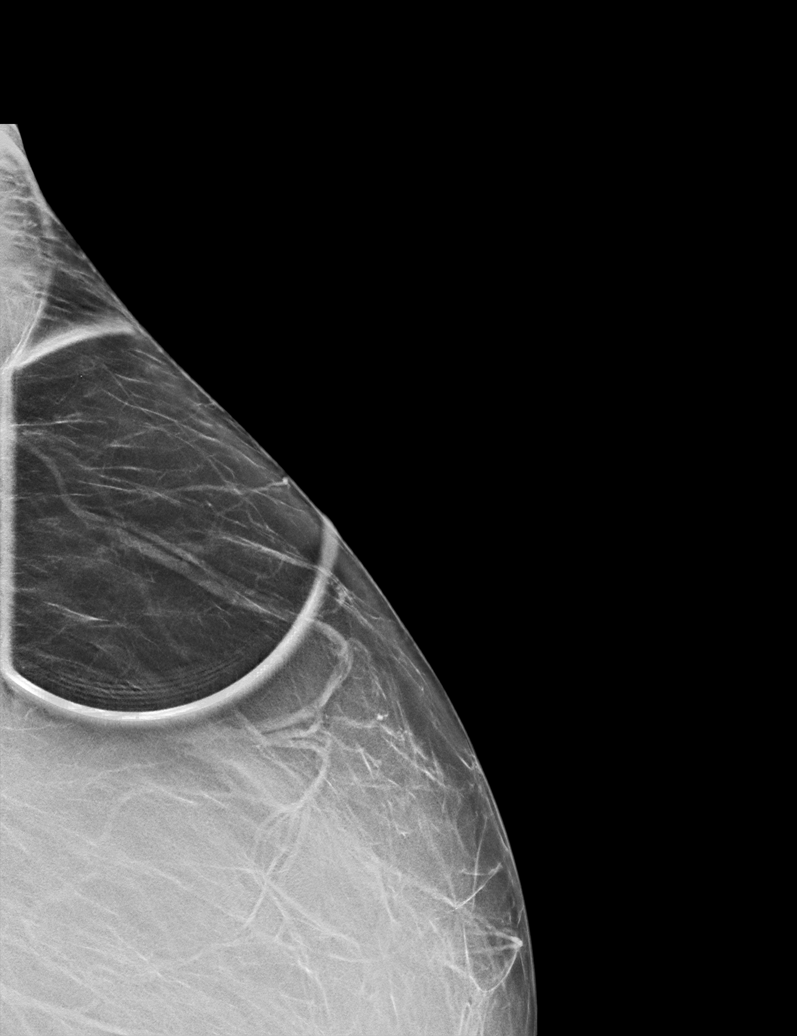

[L MLO synth-2D (2 of 2)]
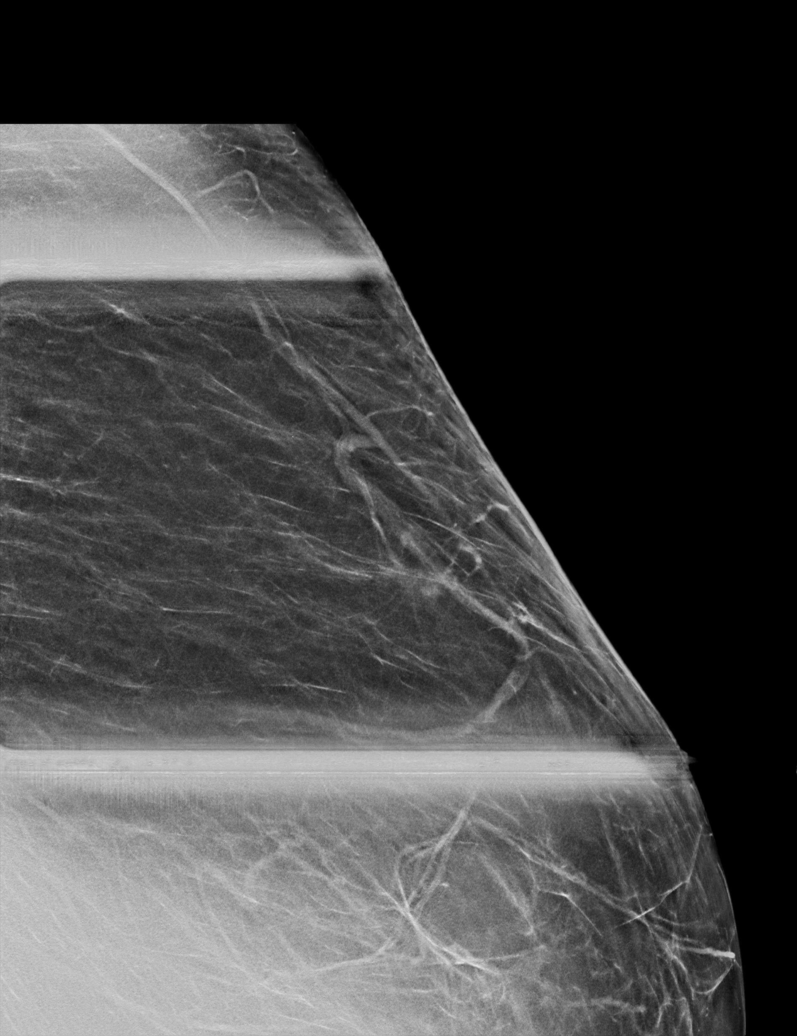

[L CC synth-2D]
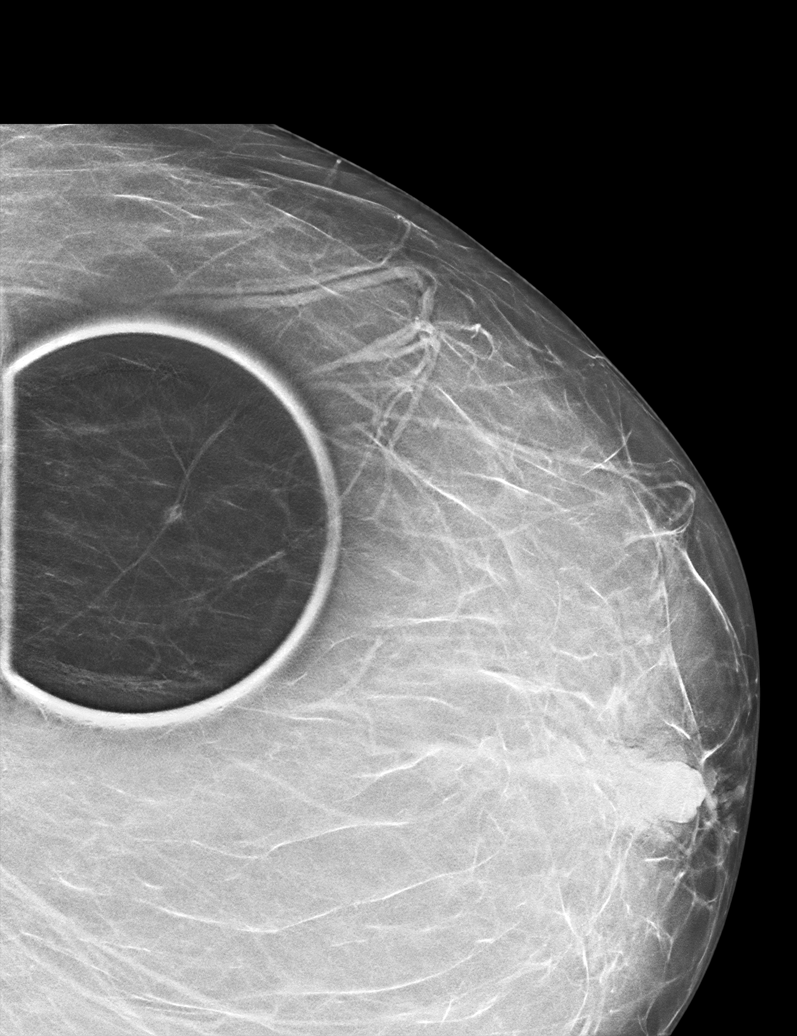

[L CC tomo · tomo slice 29/56.0]
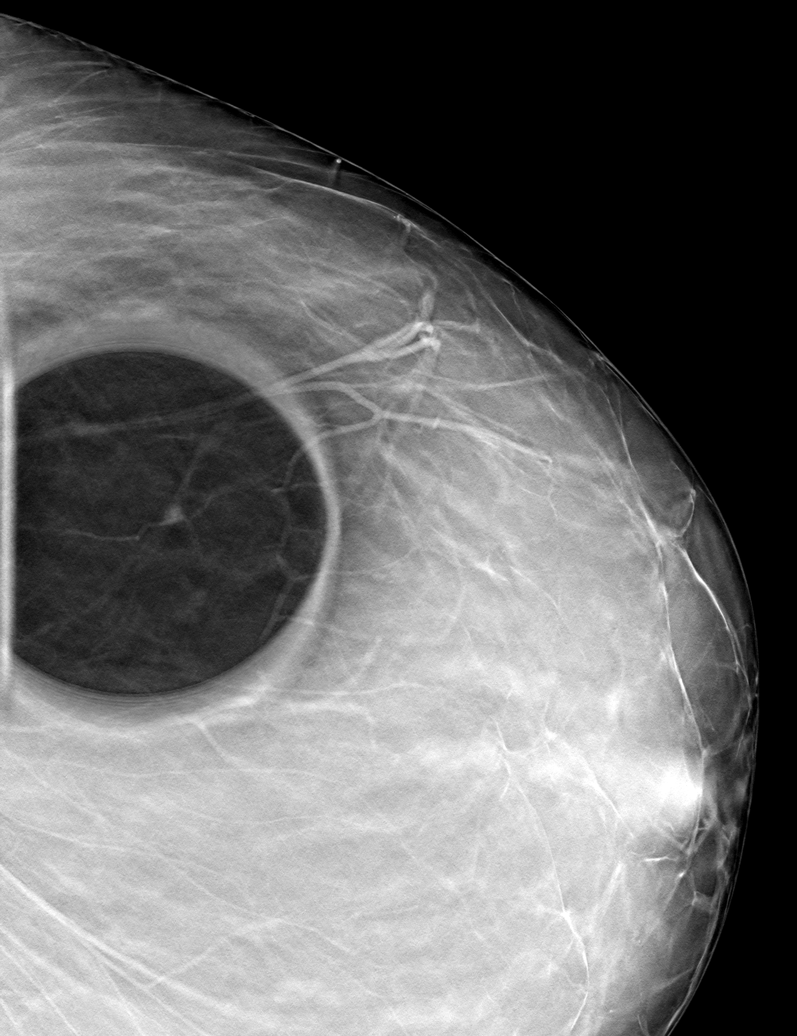

[L MLO tomo (1 of 2) · tomo slice 28/55.0]
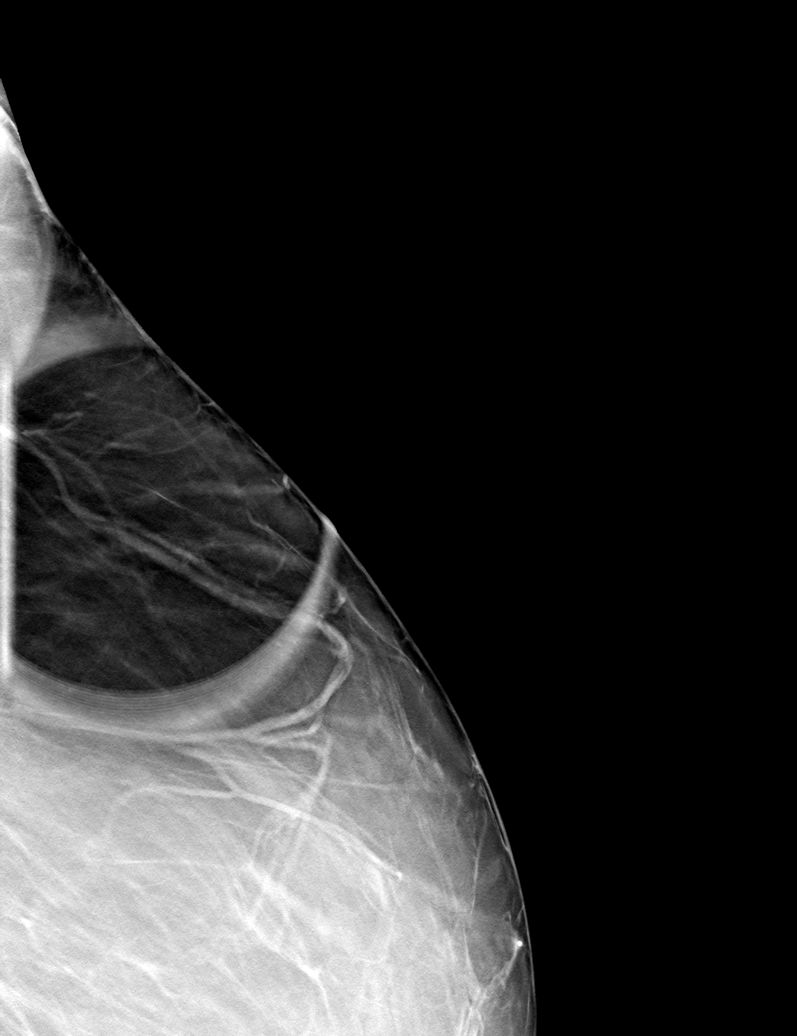

[L MLO tomo (2 of 2) · tomo slice 33/64.0]
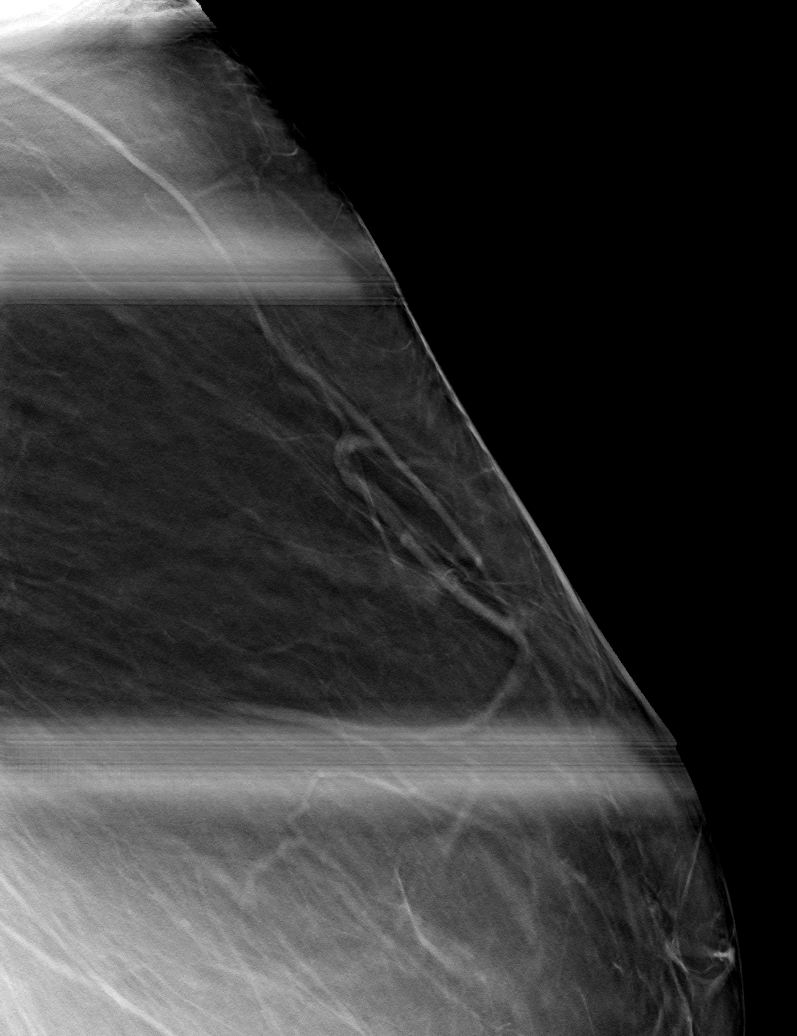

[6 of 18 positions shown; findings below may reference images not displayed]

ACR Breast Density Category b: There are scattered areas of
fibroglandular density.
FINDINGS: Mammogram:

Spot compression tomosynthesis views of the left breast performed
demonstrating persistence of a small mass in the upper outer left
breast posterior depth measuring approximately 4 mm.

On physical exam of the left breast I do not feel a discrete mass or
focal area of thickening. There are no obvious skin changes.

Ultrasound:

Targeted ultrasound performed throughout the upper-outer quadrant of
the left breast at 1 o'clock 15 cm from the nipple demonstrating a
hyperechoic area with central small cystic space overall measuring
0.7 x 0.6 x 0.5 cm. This most likely represents fat necrosis. There
is tenderness over this area while scanning. This corresponds to the
mammographic finding.
IMPRESSION: Probably benign findings in the left breast at 1 o'clock, likely
representing fat necrosis.

RECOMMENDATION:
Diagnostic left breast mammogram and ultrasound in 3 months.

I have discussed the findings and recommendations with the patient.
If applicable, a reminder letter will be sent to the patient
regarding the next appointment.

BI-RADS CATEGORY  3: Probably benign.

## 2021-11-01 IMAGING — US US BREAST*L* LIMITED INC AXILLA
1 series · 8 of 8 positions shown · non-contrast
Comparison: Previous exam(s).

CLINICAL DATA: 60-year-old female presenting as a recall from
screening for possible left breast asymmetry.

EXAM:
DIGITAL DIAGNOSTIC UNILATERAL LEFT MAMMOGRAM WITH TOMOSYNTHESIS AND
CAD; ULTRASOUND LEFT BREAST LIMITED
TECHNIQUE: Left digital diagnostic mammography and breast tomosynthesis was
performed. The images were evaluated with computer-aided detection.;
Targeted ultrasound examination of the left breast was performed.

[Series 1: us breast*left* limited inc axilla · 0.06mm/px · 8 of 8 slices shown]
[im 1/8]
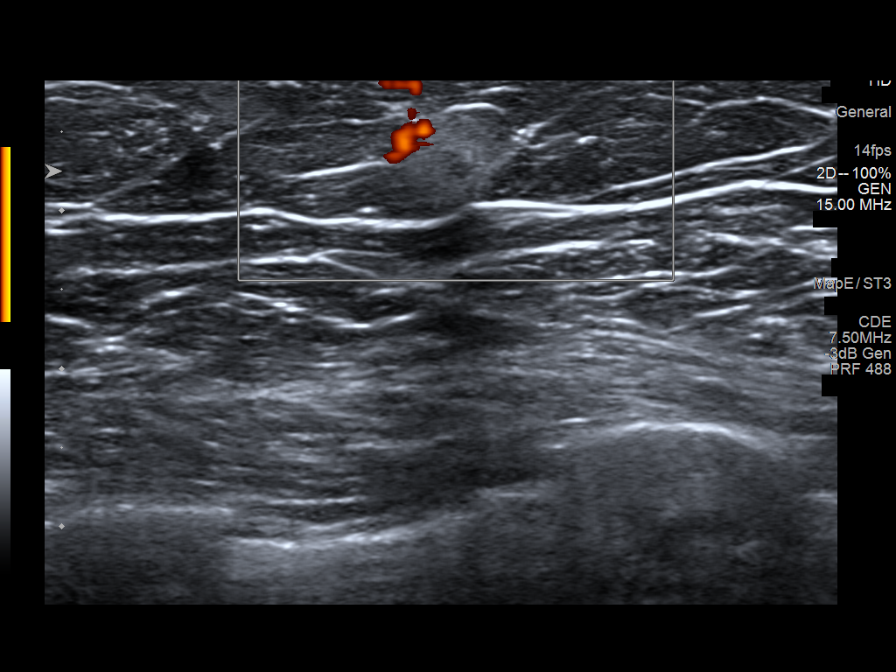
[im 2/8]
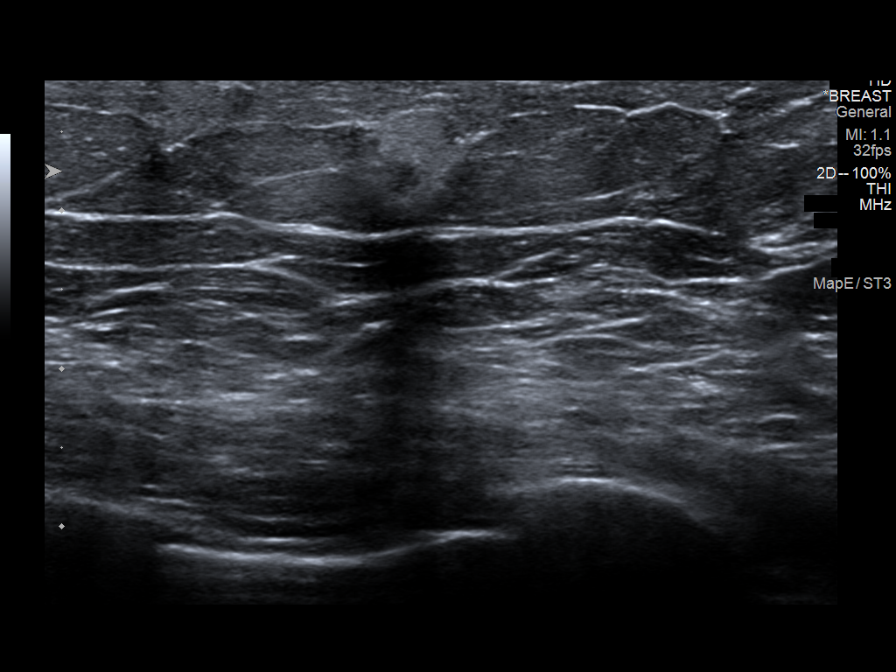
[im 3/8]
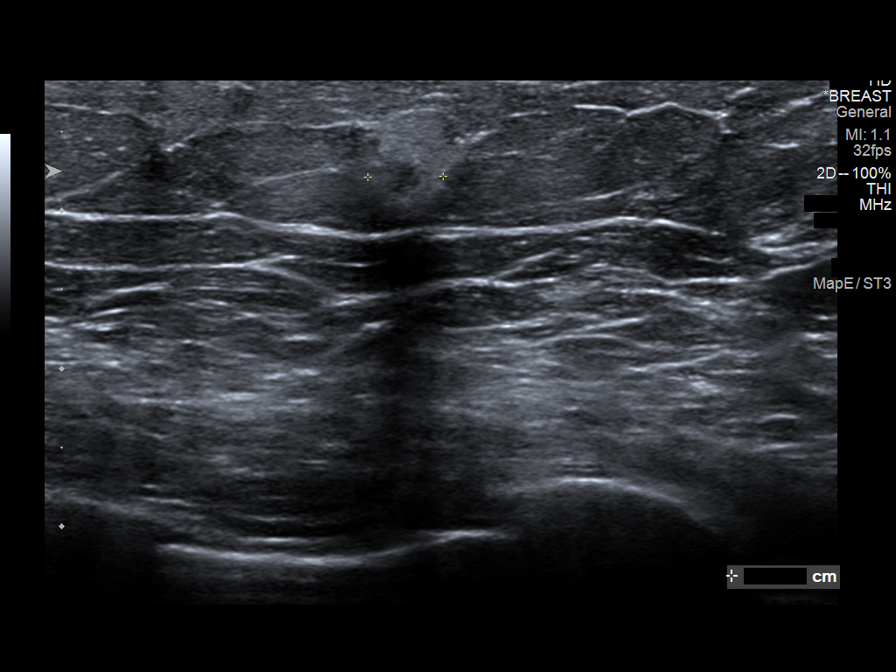
[im 4/8]
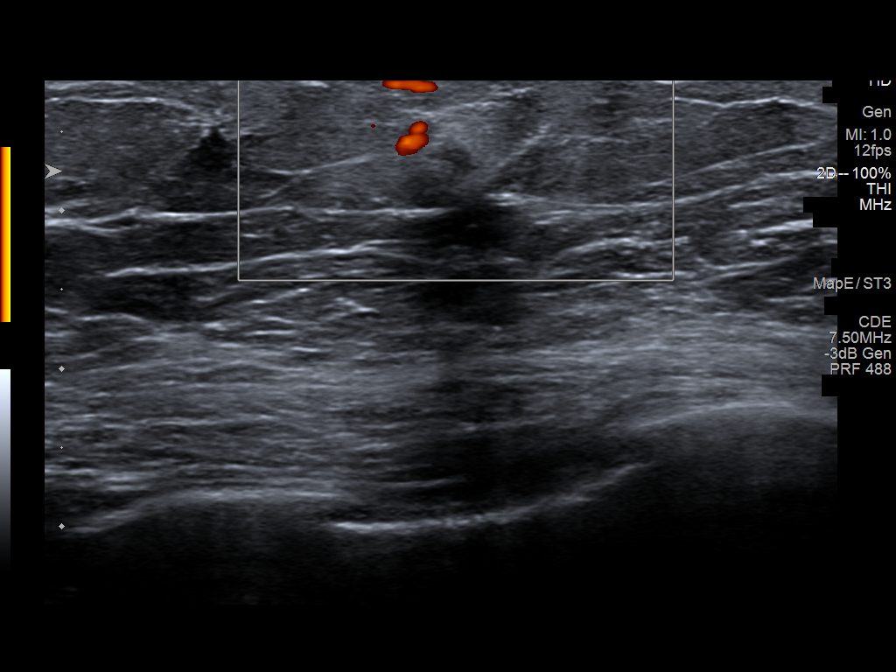
[im 5/8]
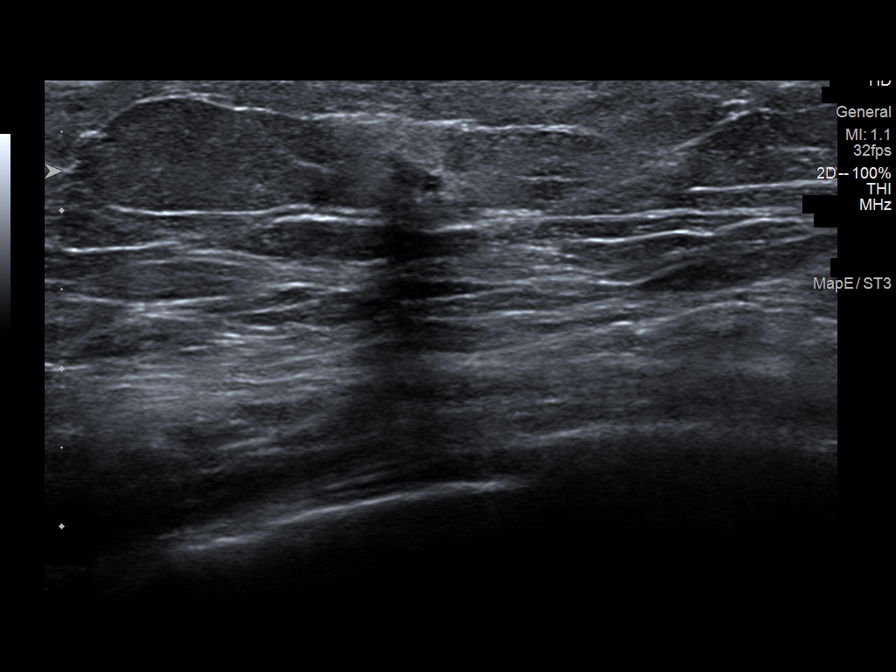
[im 6/8]
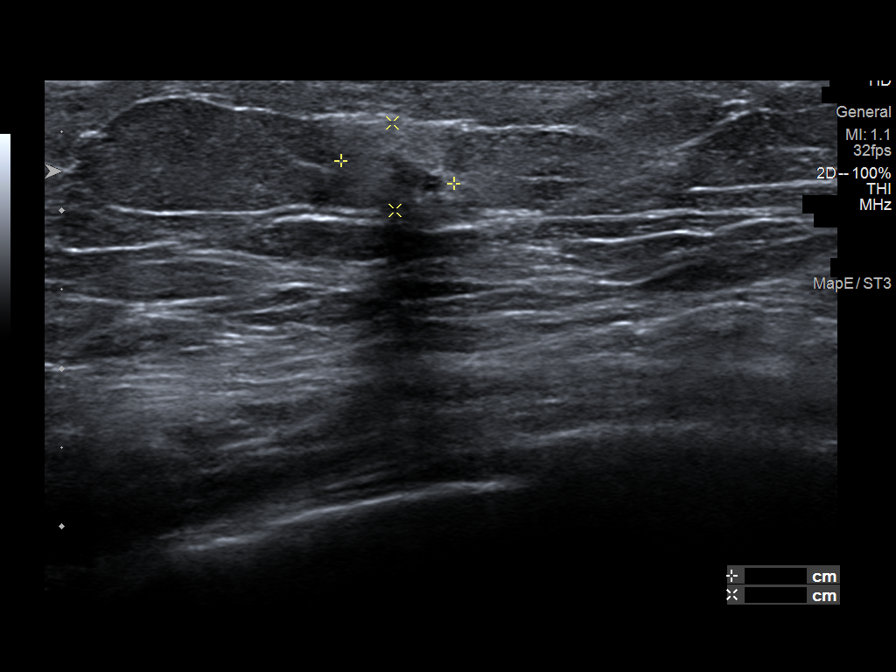
[im 7/8]
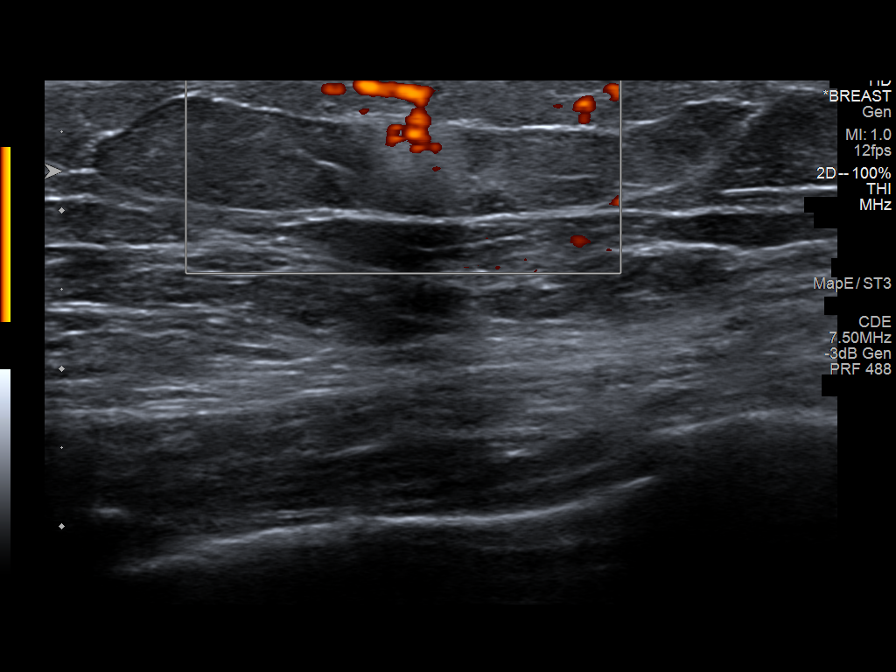
[im 8/8]
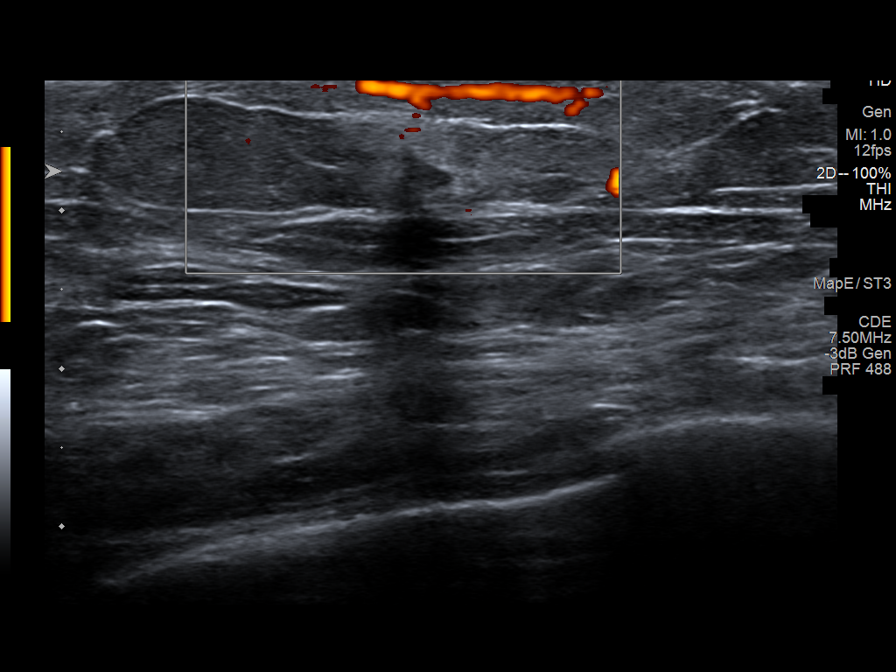

[8 of 8 positions shown; findings below may reference images not displayed]

ACR Breast Density Category b: There are scattered areas of
fibroglandular density.
FINDINGS: Mammogram:

Spot compression tomosynthesis views of the left breast performed
demonstrating persistence of a small mass in the upper outer left
breast posterior depth measuring approximately 4 mm.

On physical exam of the left breast I do not feel a discrete mass or
focal area of thickening. There are no obvious skin changes.

Ultrasound:

Targeted ultrasound performed throughout the upper-outer quadrant of
the left breast at 1 o'clock 15 cm from the nipple demonstrating a
hyperechoic area with central small cystic space overall measuring
0.7 x 0.6 x 0.5 cm. This most likely represents fat necrosis. There
is tenderness over this area while scanning. This corresponds to the
mammographic finding.
IMPRESSION: Probably benign findings in the left breast at 1 o'clock, likely
representing fat necrosis.

RECOMMENDATION:
Diagnostic left breast mammogram and ultrasound in 3 months.

I have discussed the findings and recommendations with the patient.
If applicable, a reminder letter will be sent to the patient
regarding the next appointment.

BI-RADS CATEGORY  3: Probably benign.

## 2021-11-03 ENCOUNTER — Ambulatory Visit (INDEPENDENT_AMBULATORY_CARE_PROVIDER_SITE_OTHER): Payer: Medicaid Other | Admitting: Physician Assistant

## 2021-11-03 ENCOUNTER — Encounter (INDEPENDENT_AMBULATORY_CARE_PROVIDER_SITE_OTHER): Payer: Self-pay | Admitting: Physician Assistant

## 2021-11-03 ENCOUNTER — Other Ambulatory Visit: Payer: Self-pay

## 2021-11-03 VITALS — BP 118/71 | HR 55 | Temp 97.6°F | Ht 60.0 in | Wt 219.0 lb

## 2021-11-03 DIAGNOSIS — E7849 Other hyperlipidemia: Secondary | ICD-10-CM

## 2021-11-03 DIAGNOSIS — E66813 Obesity, class 3: Secondary | ICD-10-CM

## 2021-11-03 DIAGNOSIS — E669 Obesity, unspecified: Secondary | ICD-10-CM | POA: Diagnosis not present

## 2021-11-03 DIAGNOSIS — R739 Hyperglycemia, unspecified: Secondary | ICD-10-CM | POA: Diagnosis not present

## 2021-11-03 DIAGNOSIS — E559 Vitamin D deficiency, unspecified: Secondary | ICD-10-CM | POA: Diagnosis not present

## 2021-11-03 DIAGNOSIS — Z6841 Body Mass Index (BMI) 40.0 and over, adult: Secondary | ICD-10-CM

## 2021-11-03 MED ORDER — VITAMIN D (ERGOCALCIFEROL) 1.25 MG (50000 UNIT) PO CAPS: 50000.0000 [IU] | ORAL_CAPSULE | ORAL | 0 refills | Status: DC

## 2021-11-03 NOTE — Progress Notes (Signed)
Chief Complaint:   OBESITY April [name] is here to discuss her progress with her obesity treatment plan along with follow-up of her obesity related diagnoses. April [name] is on keeping a food journal and adhering to recommended goals of 1100-1200 calories and 80 grams of protein and states she is following her eating plan approximately 80% of the time. April [name] states she is walking in the house and exercising for 30 minutes 2 times per week.  Today's visit was #: 22 Starting weight: 256 lbs Starting date: 06/13/2019 Today's weight: 219 lbs Today's date: 11/03/2021 Total lbs lost to date: 37 lbs Total lbs lost since last in-office visit: 3 lbs  Interim History: April [name] reports she was able to get back on her plan the last few weeks. She is recognizing when she wants to emotionally eat.  Subjective:   1. Vitamin D deficiency April [name] is currently taking prescription vitamin D weekly. She denies nausea, vomiting or muscle weakness. She is due for labs.  2. Other hyperlipidemia April [name]'s last Lipid panel is not at goal. She is currently not on medications.   3. Hyperglycemia April [name] notes no issues with excessive hunger. She is not on medications currently.  Assessment/Plan:   1. Vitamin D deficiency Low Vitamin D level contributes to fatigue and are associated with obesity, breast, and colon cancer. We will refill prescription Vitamin D 50,000 IU every 7 days for 1 month with no refills and April [name] will follow-up for routine testing of Vitamin D, at least 2-3 times per year to avoid over-replacement.  - Vitamin D, Ergocalciferol, (DRISDOL) 1.25 MG (50000 UNIT) CAPS capsule; Take 1 capsule (50,000 Units total) by mouth every 7 (seven) days.  Dispense: 4 capsule; Refill: 0 - VITAMIN D 25 Hydroxy (Vit-D Deficiency, Fractures)  2. Other hyperlipidemia Cardiovascular risk and specific lipid/LDL goals reviewed.  We will check labs today. We discussed several lifestyle modifications today  and April [name] will continue to work on diet, exercise and weight loss efforts. Orders and follow up as documented in patient record.   Counseling Intensive lifestyle modifications are the first line treatment for this issue. Dietary changes: Increase soluble fiber. Decrease simple carbohydrates. Exercise changes: Moderate to vigorous-intensity aerobic activity 150 minutes per week if tolerated. Lipid-lowering medications: see documented in medical record. - Lipid panel  3. Hyperglycemia We will check labs today. Fasting labs will be obtained and results with be discussed with April [name] in 2 weeks at her follow up visit. In the meanwhile April [name] was started on a lower simple carbohydrate diet and will work on weight loss efforts.   - Comprehensive metabolic panel - Hemoglobin A1c - Insulin, random  4. Obesity with current BMI of 42.77 April [name] is currently in the action stage of change. As such, her goal is to continue with weight loss efforts. She has agreed to keeping a food journal and adhering to recommended goals of 1100-1200 calories and 80 grams of  protein daily.  Exercise goals:  As is.  Behavioral modification strategies: meal planning and cooking strategies and keeping healthy foods in the home.  April [name] has agreed to follow-up with our clinic in 3 weeks. She was informed of the importance of frequent follow-up visits to maximize her success with intensive lifestyle modifications for her multiple health conditions.   Objective:   Blood pressure 118/71, pulse (!) 55, temperature 97.6 F (36.4 C), height 5' (1.524 m), weight 219 lb (99.3 kg), SpO2 97 %. Body mass index is 42.77 kg/m.  General: Cooperative, alert, well developed,  in no acute distress. HEENT: Conjunctivae and lids unremarkable. Cardiovascular: Regular rhythm.  Lungs: Normal work of breathing. Neurologic: No focal deficits.   Lab Results  Component Value Date   CREATININE 0.80 04/07/2021   BUN 17  04/07/2021   NA 139 04/07/2021   K 4.0 04/07/2021   CL 95 (L) 04/07/2021   CO2 25 04/07/2021   Lab Results  Component Value Date   ALT 12 04/07/2021   AST 18 04/07/2021   ALKPHOS 146 (H) 04/07/2021   BILITOT 0.3 04/07/2021   Lab Results  Component Value Date   HGBA1C 5.5 04/29/2021   HGBA1C 7.3 (H) 04/07/2021   HGBA1C 5.3 11/06/2020   HGBA1C 5.4 07/10/2020   HGBA1C 5.5 03/20/2020   Lab Results  Component Value Date   INSULIN 3.6 04/07/2021   INSULIN 13.8 11/06/2020   INSULIN 11.6 07/10/2020   INSULIN 7.5 03/20/2020   INSULIN 8.6 10/24/2019   Lab Results  Component Value Date   TSH 1.420 06/01/2018   Lab Results  Component Value Date   CHOL 215 (H) 04/07/2021   HDL 72 04/07/2021   LDLCALC 129 (H) 04/07/2021   LDLDIRECT 153 (H) 08/16/2020   TRIG 81 04/07/2021   CHOLHDL 3.0 04/07/2021   Lab Results  Component Value Date   VD25OH 65.2 04/07/2021   VD25OH 61.9 11/06/2020   VD25OH 56.9 07/10/2020   Lab Results  Component Value Date   WBC 6.4 10/24/2019   HGB 15.1 10/24/2019   HCT 45.4 10/24/2019   MCV 85 10/24/2019   PLT 354 10/24/2019   Lab Results  Component Value Date   IRON 36 (L) 12/11/2009   TIBC 318 12/11/2009   FERRITIN 19 12/11/2009   Attestation Statements:   Reviewed by clinician on day of visit: allergies, medications, problem list, medical history, surgical history, family history, social history, and previous encounter notes.  I, Tonye Pearson, am acting as Location manager for [employer], PA-C.  I have reviewed the above documentation for accuracy and completeness, and I agree with the above. Abby Potash, PA-C

## 2021-11-06 DIAGNOSIS — R739 Hyperglycemia, unspecified: Secondary | ICD-10-CM | POA: Diagnosis not present

## 2021-11-06 DIAGNOSIS — M1712 Unilateral primary osteoarthritis, left knee: Secondary | ICD-10-CM | POA: Diagnosis not present

## 2021-11-06 DIAGNOSIS — L80 Vitiligo: Secondary | ICD-10-CM | POA: Diagnosis not present

## 2021-11-06 DIAGNOSIS — E7849 Other hyperlipidemia: Secondary | ICD-10-CM | POA: Diagnosis not present

## 2021-11-06 DIAGNOSIS — E559 Vitamin D deficiency, unspecified: Secondary | ICD-10-CM | POA: Diagnosis not present

## 2021-11-07 LAB — COMPREHENSIVE METABOLIC PANEL
ALT: 8 IU/L (ref 0–32)
AST: 15 IU/L (ref 0–40)
Albumin/Globulin Ratio: 1.4 (ref 1.2–2.2)
Albumin: 4.7 g/dL (ref 3.8–4.9)
Alkaline Phosphatase: 144 IU/L — ABNORMAL HIGH (ref 44–121)
BUN/Creatinine Ratio: 33 — ABNORMAL HIGH (ref 9–23)
BUN: 23 mg/dL (ref 6–24)
Bilirubin Total: 0.3 mg/dL (ref 0.0–1.2)
CO2: 25 mmol/L (ref 20–29)
Calcium: 9.8 mg/dL (ref 8.7–10.2)
Chloride: 98 mmol/L (ref 96–106)
Creatinine, Ser: 0.69 mg/dL (ref 0.57–1.00)
Globulin, Total: 3.4 g/dL (ref 1.5–4.5)
Glucose: 93 mg/dL (ref 70–99)
Potassium: 4 mmol/L (ref 3.5–5.2)
Sodium: 138 mmol/L (ref 134–144)
Total Protein: 8.1 g/dL (ref 6.0–8.5)
eGFR: 100 mL/min/{1.73_m2} (ref >59)

## 2021-11-07 LAB — HEMOGLOBIN A1C
Est. average glucose Bld gHb Est-mCnc: 114 mg/dL
Hgb A1c MFr Bld: 5.6 % (ref 4.8–5.6)

## 2021-11-07 LAB — LIPID PANEL
Chol/HDL Ratio: 3.7 ratio (ref 0.0–4.4)
Cholesterol, Total: 231 mg/dL — ABNORMAL HIGH (ref 100–199)
HDL: 63 mg/dL (ref >39)
LDL Chol Calc (NIH): 153 mg/dL — ABNORMAL HIGH (ref 0–99)
Triglycerides: 84 mg/dL (ref 0–149)
VLDL Cholesterol Cal: 15 mg/dL (ref 5–40)

## 2021-11-07 LAB — VITAMIN D 25 HYDROXY (VIT D DEFICIENCY, FRACTURES): Vit D, 25-Hydroxy: 79.9 ng/mL (ref 30.0–100.0)

## 2021-11-07 LAB — INSULIN, RANDOM: INSULIN: 7.3 u[IU]/mL (ref 2.6–24.9)

## 2021-11-13 ENCOUNTER — Ambulatory Visit
Admission: RE | Admit: 2021-11-13 | Discharge: 2021-11-13 | Disposition: A | Discharge status: Home / Self Care | Payer: Medicaid Other | Source: Ambulatory Visit | Attending: Family Medicine | Admitting: Family Medicine

## 2021-11-13 ENCOUNTER — Encounter: Payer: Self-pay | Admitting: Family Medicine

## 2021-11-13 ENCOUNTER — Other Ambulatory Visit: Payer: Self-pay

## 2021-11-13 DIAGNOSIS — N6489 Other specified disorders of breast: Secondary | ICD-10-CM

## 2021-11-13 DIAGNOSIS — R922 Inconclusive mammogram: Secondary | ICD-10-CM | POA: Diagnosis not present

## 2021-11-21 ENCOUNTER — Encounter (HOSPITAL_COMMUNITY): Payer: Self-pay

## 2021-11-21 ENCOUNTER — Emergency Department (HOSPITAL_COMMUNITY): Payer: Medicaid Other

## 2021-11-21 ENCOUNTER — Emergency Department (HOSPITAL_COMMUNITY)
Admission: EM | Admit: 2021-11-21 | Discharge: 2021-11-21 | Disposition: A | Discharge status: Home / Self Care | Payer: Medicaid Other | Attending: Emergency Medicine | Admitting: Emergency Medicine

## 2021-11-21 DIAGNOSIS — R0902 Hypoxemia: Secondary | ICD-10-CM | POA: Diagnosis not present

## 2021-11-21 DIAGNOSIS — R109 Unspecified abdominal pain: Secondary | ICD-10-CM | POA: Diagnosis not present

## 2021-11-21 DIAGNOSIS — R531 Weakness: Secondary | ICD-10-CM | POA: Insufficient documentation

## 2021-11-21 DIAGNOSIS — Z96651 Presence of right artificial knee joint: Secondary | ICD-10-CM | POA: Diagnosis not present

## 2021-11-21 DIAGNOSIS — I959 Hypotension, unspecified: Secondary | ICD-10-CM | POA: Insufficient documentation

## 2021-11-21 DIAGNOSIS — K436 Other and unspecified ventral hernia with obstruction, without gangrene: Secondary | ICD-10-CM | POA: Insufficient documentation

## 2021-11-21 DIAGNOSIS — R197 Diarrhea, unspecified: Secondary | ICD-10-CM | POA: Diagnosis not present

## 2021-11-21 DIAGNOSIS — I10 Essential (primary) hypertension: Secondary | ICD-10-CM | POA: Diagnosis not present

## 2021-11-21 DIAGNOSIS — R42 Dizziness and giddiness: Secondary | ICD-10-CM | POA: Diagnosis not present

## 2021-11-21 DIAGNOSIS — Z87891 Personal history of nicotine dependence: Secondary | ICD-10-CM | POA: Diagnosis not present

## 2021-11-21 DIAGNOSIS — R61 Generalized hyperhidrosis: Secondary | ICD-10-CM | POA: Insufficient documentation

## 2021-11-21 DIAGNOSIS — E860 Dehydration: Secondary | ICD-10-CM | POA: Diagnosis not present

## 2021-11-21 DIAGNOSIS — R1084 Generalized abdominal pain: Secondary | ICD-10-CM | POA: Diagnosis not present

## 2021-11-21 LAB — COMPREHENSIVE METABOLIC PANEL
ALT: 10 U/L (ref 0–44)
AST: 17 U/L (ref 15–41)
Albumin: 3.4 g/dL — ABNORMAL LOW (ref 3.5–5.0)
Alkaline Phosphatase: 105 U/L (ref 38–126)
Anion gap: 9 (ref 5–15)
BUN: 20 mg/dL (ref 6–20)
CO2: 24 mmol/L (ref 22–32)
Calcium: 8.3 mg/dL — ABNORMAL LOW (ref 8.9–10.3)
Chloride: 104 mmol/L (ref 98–111)
Creatinine, Ser: 0.75 mg/dL (ref 0.44–1.00)
GFR, Estimated: >60 mL/min (ref >60)
Glucose, Bld: 90 mg/dL (ref 70–99)
Potassium: 3.5 mmol/L (ref 3.5–5.1)
Sodium: 137 mmol/L (ref 135–145)
Total Bilirubin: 0.4 mg/dL (ref 0.3–1.2)
Total Protein: 6.7 g/dL (ref 6.5–8.1)

## 2021-11-21 LAB — CBC WITH DIFFERENTIAL/PLATELET
Abs Immature Granulocytes: 0.03 10*3/uL (ref 0.00–0.07)
Basophils Absolute: 0 10*3/uL (ref 0.0–0.1)
Basophils Relative: 0 %
Eosinophils Absolute: 0 10*3/uL (ref 0.0–0.5)
Eosinophils Relative: 0 %
HCT: 38.5 % (ref 36.0–46.0)
Hemoglobin: 12.7 g/dL (ref 12.0–15.0)
Immature Granulocytes: 0 %
Lymphocytes Relative: 11 %
Lymphs Abs: 0.9 10*3/uL (ref 0.7–4.0)
MCH: 29.1 pg (ref 26.0–34.0)
MCHC: 33 g/dL (ref 30.0–36.0)
MCV: 88.3 fL (ref 80.0–100.0)
Monocytes Absolute: 0.7 10*3/uL (ref 0.1–1.0)
Monocytes Relative: 9 %
Neutro Abs: 6 10*3/uL (ref 1.7–7.7)
Neutrophils Relative %: 80 %
Platelets: 276 10*3/uL (ref 150–400)
RBC: 4.36 MIL/uL (ref 3.87–5.11)
RDW: 15.3 % (ref 11.5–15.5)
WBC: 7.6 10*3/uL (ref 4.0–10.5)
nRBC: 0 % (ref 0.0–0.2)

## 2021-11-21 LAB — URINALYSIS, ROUTINE W REFLEX MICROSCOPIC
Bilirubin Urine: NEGATIVE
Glucose, UA: NEGATIVE mg/dL
Hgb urine dipstick: NEGATIVE
Ketones, ur: 5 mg/dL — AB
Leukocytes,Ua: NEGATIVE
Nitrite: NEGATIVE
Protein, ur: NEGATIVE mg/dL
Specific Gravity, Urine: 1.011 (ref 1.005–1.030)
pH: 5 (ref 5.0–8.0)

## 2021-11-21 LAB — MAGNESIUM: Magnesium: 1.8 mg/dL (ref 1.7–2.4)

## 2021-11-21 MED ORDER — ACETAMINOPHEN 325 MG PO TABS
650.0000 mg | ORAL_TABLET | Freq: Once | ORAL | Status: AC
  Administered 2021-11-21: 650 mg via ORAL
  Filled 2021-11-21: qty 2

## 2021-11-21 MED ORDER — LACTATED RINGERS IV BOLUS
1000.0000 mL | Freq: Once | INTRAVENOUS | Status: AC
  Administered 2021-11-21: 1000 mL via INTRAVENOUS

## 2021-11-21 MED ORDER — IOHEXOL 300 MG/ML  SOLN
100.0000 mL | Freq: Once | INTRAMUSCULAR | Status: AC | PRN
  Administered 2021-11-21: 100 mL via INTRAVENOUS

## 2021-11-21 MED ORDER — ONDANSETRON HCL 4 MG PO TABS: 4.0000 mg | ORAL_TABLET | Freq: Four times a day (QID) | ORAL | 0 refills | Status: DC

## 2021-11-21 NOTE — Discharge Instructions (Addendum)
Seen and evaluated in our emergency department I suspect that your symptoms are likely related to dehydration after multiple episodes of diarrhea.  We will discharge you home with a prescription for Zofran. Your prescription was sent to [facility] on [address].
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
## 2021-11-21 NOTE — ED Triage Notes (Signed)
Woke up feeling "woozy"/ dizziness especially with changes in position, generalized abdominal pain with diarrhea, standing BP was 55/33/70's auscultated, was diaphoretic and pale when medics arrived, was given 450cc fluid IV, CBG 123, 12 lead unremarkable, 18g \\placed  left EJ.

## 2021-11-21 NOTE — ED Notes (Signed)
Steady even gait

## 2021-11-23 ENCOUNTER — Other Ambulatory Visit: Payer: Self-pay

## 2021-11-23 ENCOUNTER — Encounter (HOSPITAL_COMMUNITY): Payer: Self-pay | Admitting: Emergency Medicine

## 2021-11-23 ENCOUNTER — Ambulatory Visit (HOSPITAL_COMMUNITY)
Admission: EM | Admit: 2021-11-23 | Discharge: 2021-11-23 | Disposition: A | Discharge status: Home / Self Care | Payer: Medicaid Other | Attending: Physician Assistant | Admitting: Physician Assistant

## 2021-11-23 DIAGNOSIS — R197 Diarrhea, unspecified: Secondary | ICD-10-CM | POA: Insufficient documentation

## 2021-11-23 DIAGNOSIS — R1084 Generalized abdominal pain: Secondary | ICD-10-CM | POA: Insufficient documentation

## 2021-11-23 LAB — CBC WITH DIFFERENTIAL/PLATELET
Abs Immature Granulocytes: 0.02 10*3/uL (ref 0.00–0.07)
Basophils Absolute: 0 10*3/uL (ref 0.0–0.1)
Basophils Relative: 0 %
Eosinophils Absolute: 0.1 10*3/uL (ref 0.0–0.5)
Eosinophils Relative: 1 %
HCT: 44.3 % (ref 36.0–46.0)
Hemoglobin: 14.8 g/dL (ref 12.0–15.0)
Immature Granulocytes: 0 %
Lymphocytes Relative: 14 %
Lymphs Abs: 1.3 10*3/uL (ref 0.7–4.0)
MCH: 28.5 pg (ref 26.0–34.0)
MCHC: 33.4 g/dL (ref 30.0–36.0)
MCV: 85.4 fL (ref 80.0–100.0)
Monocytes Absolute: 0.9 10*3/uL (ref 0.1–1.0)
Monocytes Relative: 10 %
Neutro Abs: 6.9 10*3/uL (ref 1.7–7.7)
Neutrophils Relative %: 75 %
Platelets: 305 10*3/uL (ref 150–400)
RBC: 5.19 MIL/uL — ABNORMAL HIGH (ref 3.87–5.11)
RDW: 15.2 % (ref 11.5–15.5)
WBC: 9.3 10*3/uL (ref 4.0–10.5)
nRBC: 0 % (ref 0.0–0.2)

## 2021-11-23 LAB — COMPREHENSIVE METABOLIC PANEL
ALT: 12 U/L (ref 0–44)
AST: 17 U/L (ref 15–41)
Albumin: 3.6 g/dL (ref 3.5–5.0)
Alkaline Phosphatase: 100 U/L (ref 38–126)
Anion gap: 10 (ref 5–15)
BUN: 7 mg/dL (ref 6–20)
CO2: 25 mmol/L (ref 22–32)
Calcium: 9.1 mg/dL (ref 8.9–10.3)
Chloride: 103 mmol/L (ref 98–111)
Creatinine, Ser: 0.6 mg/dL (ref 0.44–1.00)
GFR, Estimated: >60 mL/min (ref >60)
Glucose, Bld: 109 mg/dL — ABNORMAL HIGH (ref 70–99)
Potassium: 3.1 mmol/L — ABNORMAL LOW (ref 3.5–5.1)
Sodium: 138 mmol/L (ref 135–145)
Total Bilirubin: 0.4 mg/dL (ref 0.3–1.2)
Total Protein: 7.9 g/dL (ref 6.5–8.1)

## 2021-11-23 LAB — POCT URINALYSIS DIPSTICK, ED / UC
Glucose, UA: NEGATIVE mg/dL
Ketones, ur: 15 mg/dL — AB
Nitrite: NEGATIVE
Protein, ur: 100 mg/dL — AB
Specific Gravity, Urine: >=1.03 (ref 1.005–1.030)
Urobilinogen, UA: 0.2 mg/dL (ref 0.0–1.0)
pH: 6 (ref 5.0–8.0)

## 2021-11-23 LAB — C DIFFICILE QUICK SCREEN W PCR REFLEX
C Diff antigen: NEGATIVE
C Diff interpretation: NOT DETECTED
C Diff toxin: NEGATIVE

## 2021-11-23 MED ORDER — POTASSIUM CHLORIDE ER 10 MEQ PO TBCR: 10.0000 meq | EXTENDED_RELEASE_TABLET | Freq: Every day | ORAL | 0 refills | Status: DC
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
## 2021-11-23 NOTE — ED Triage Notes (Signed)
Pt reports lower abdominal pain and diarrhea since Friday.

## 2021-11-23 NOTE — ED Notes (Signed)
Urine specimen placed in lab

## 2021-11-23 NOTE — Discharge Instructions (Addendum)
I will contact you if your lab work is abnormal and I need to change your treatment.  Your blood counts were normal.  Your metabolic panel showed low potassium.  I have called in a potassium supplement that I would like you to take daily.  This level will need to be repeated with your primary care provider as soon as possible.  Please follow-up with your primary care tomorrow.  Make sure you are drinking plenty of fluid.  Eat a bland diet by avoiding spicy/acidic/fatty foods.  If you develop any worsening diarrhea, nausea/vomiting interfering with oral intake, worsening abdominal pain you must go to the emergency room as we discussed.

## 2021-11-24 ENCOUNTER — Telehealth (HOSPITAL_COMMUNITY): Payer: Self-pay | Admitting: Emergency Medicine

## 2021-11-24 ENCOUNTER — Encounter (INDEPENDENT_AMBULATORY_CARE_PROVIDER_SITE_OTHER): Payer: Self-pay | Admitting: Physician Assistant

## 2021-11-24 ENCOUNTER — Ambulatory Visit (INDEPENDENT_AMBULATORY_CARE_PROVIDER_SITE_OTHER): Payer: Medicaid Other | Admitting: Physician Assistant

## 2021-11-24 VITALS — BP 100/69 | HR 59 | Temp 98.8°F | Ht 60.0 in | Wt 217.0 lb

## 2021-11-24 DIAGNOSIS — E559 Vitamin D deficiency, unspecified: Secondary | ICD-10-CM

## 2021-11-24 DIAGNOSIS — E669 Obesity, unspecified: Secondary | ICD-10-CM

## 2021-11-24 DIAGNOSIS — Z6841 Body Mass Index (BMI) 40.0 and over, adult: Secondary | ICD-10-CM | POA: Diagnosis not present

## 2021-11-24 DIAGNOSIS — E8881 Metabolic syndrome: Secondary | ICD-10-CM | POA: Diagnosis not present

## 2021-11-24 LAB — URINE CULTURE: Culture: 60000 — AB

## 2021-11-24 MED ORDER — AMOXICILLIN 500 MG PO CAPS: 500.0000 mg | ORAL_CAPSULE | Freq: Once | ORAL | 0 refills | Status: AC

## 2021-11-24 MED ORDER — VITAMIN D (ERGOCALCIFEROL) 1.25 MG (50000 UNIT) PO CAPS: ORAL_CAPSULE | ORAL | 0 refills | Status: DC

## 2021-11-24 NOTE — Progress Notes (Signed)
Chief Complaint:   OBESITY April [name] is here to discuss her progress with her obesity treatment plan along with follow-up of her obesity related diagnoses. April [name] is on keeping a food journal and adhering to recommended goals of 1100-1200 calories and 80 grams of protein daily and states she is following her eating plan approximately 80% of the time. April [name] states she is walking for 30-40 minutes 2-3 times per week.  Today's visit was #: 62 Starting weight: 256 lbs Starting date: 06/13/2019 Today's weight: 217 lbs Today's date: 11/24/2021 Total lbs lost to date: 39 Total lbs lost since last in-office visit: 2  Interim History: April [name] was in the emergency department recently due to a GI virus. She is currently taking Zofran for nausea. She is feeling somewhat better but fatigued overall.   Subjective:   1. Vitamin D deficiency April [name] is on Vit D weekly, and she is tolerating it well. I discussed labs with the patient today.  2. Insulin resistance April [name]'s last A1c was 5.6, and she is not on medications. I discussed labs with the patient today.  Assessment/Plan:   1. Vitamin D deficiency April [name] agreed to change prescription Vitamin D to 50,000 IU every 10 days, and we will refill for 1 month. She will follow-up for routine testing of Vitamin D, at least 2-3 times per year to avoid over-replacement.  - Vitamin D, Ergocalciferol, (DRISDOL) 1.25 MG (50000 UNIT) CAPS capsule; One Capsule by mouth every 10 days  Dispense: 3 capsule; Refill: 0  2. Insulin resistance April [name] will continue her meal plan and weight loss to help decrease the risk of diabetes. An agreed to follow-up with [place] as directed to closely monitor her progress.  3. Obesity with current BMI of 42.38 April [name] is currently in the action stage of change. As such, her goal is to continue with weight loss efforts. She has agreed to keeping a food journal and adhering to recommended goals of 1100-1200  calories and 80 grams of protein daily.   Progress diet as tolerated, and add protein shakes and Gatorade zero the next few days.  Exercise goals: As is.  Behavioral modification strategies: meal planning and cooking strategies.  April [name] has agreed to follow-up with our clinic in 3 weeks. She was informed of the importance of frequent follow-up visits to maximize her success with intensive lifestyle modifications for her multiple health conditions.   Objective:   Blood pressure 100/69, pulse (!) 59, temperature 98.8 F (37.1 C), height 5' (1.524 m), weight 217 lb (98.4 kg), SpO2 97 %. Body mass index is 42.38 kg/m.  General: Cooperative, alert, well developed, in no acute distress. HEENT: Conjunctivae and lids unremarkable. Cardiovascular: Regular rhythm.  Lungs: Normal work of breathing. Neurologic: No focal deficits.   Lab Results  Component Value Date   CREATININE 0.60 11/23/2021   BUN 7 11/23/2021   NA 138 11/23/2021   K 3.1 (L) 11/23/2021   CL 103 11/23/2021   CO2 25 11/23/2021   Lab Results  Component Value Date   ALT 12 11/23/2021   AST 17 11/23/2021   ALKPHOS 100 11/23/2021   BILITOT 0.4 11/23/2021   Lab Results  Component Value Date   HGBA1C 5.6 11/06/2021   HGBA1C 5.5 04/29/2021   HGBA1C 7.3 (H) 04/07/2021   HGBA1C 5.3 11/06/2020   HGBA1C 5.4 07/10/2020   Lab Results  Component Value Date   INSULIN 7.3 11/06/2021   INSULIN 3.6 04/07/2021   INSULIN 13.8 11/06/2020   INSULIN 11.6 07/10/2020  INSULIN 7.5 03/20/2020   Lab Results  Component Value Date   TSH 1.420 06/01/2018   Lab Results  Component Value Date   CHOL 231 (H) 11/06/2021   HDL 63 11/06/2021   LDLCALC 153 (H) 11/06/2021   LDLDIRECT 153 (H) 08/16/2020   TRIG 84 11/06/2021   CHOLHDL 3.7 11/06/2021   Lab Results  Component Value Date   VD25OH 79.9 11/06/2021   VD25OH 65.2 04/07/2021   VD25OH 61.9 11/06/2020   Lab Results  Component Value Date   WBC 9.3 11/23/2021   HGB  14.8 11/23/2021   HCT 44.3 11/23/2021   MCV 85.4 11/23/2021   PLT 305 11/23/2021   Lab Results  Component Value Date   IRON 36 (L) 12/11/2009   TIBC 318 12/11/2009   FERRITIN 19 12/11/2009   Attestation Statements:   Reviewed by clinician on day of visit: allergies, medications, problem list, medical history, surgical history, family history, social history, and previous encounter notes.   Wilhemena Durie, am acting as transcriptionist for [employer], PA-C.  I have reviewed the above documentation for accuracy and completeness, and I agree with the above. Abby Potash, PA-C

## 2021-11-25 LAB — GASTROINTESTINAL PANEL BY PCR, STOOL (REPLACES STOOL CULTURE)

## 2021-11-28 ENCOUNTER — Other Ambulatory Visit: Payer: Self-pay | Admitting: Family Medicine

## 2021-11-28 DIAGNOSIS — M170 Bilateral primary osteoarthritis of knee: Secondary | ICD-10-CM

## 2021-12-04 DIAGNOSIS — M1712 Unilateral primary osteoarthritis, left knee: Secondary | ICD-10-CM | POA: Diagnosis not present

## 2021-12-09 ENCOUNTER — Other Ambulatory Visit: Payer: Self-pay

## 2021-12-09 ENCOUNTER — Ambulatory Visit: Payer: Medicaid Other | Admitting: Family Medicine

## 2021-12-09 ENCOUNTER — Encounter: Payer: Self-pay | Admitting: Family Medicine

## 2021-12-09 VITALS — BP 110/72 | HR 69 | Ht 60.0 in | Wt 224.6 lb

## 2021-12-09 DIAGNOSIS — I10 Essential (primary) hypertension: Secondary | ICD-10-CM

## 2021-12-09 DIAGNOSIS — Z23 Encounter for immunization: Secondary | ICD-10-CM

## 2021-12-09 NOTE — Patient Instructions (Signed)
It was great seeing you today!  I am so glad that you are feeling better! Sometimes having an illness like this one can alter your electrolytes so we will get some blood work today to make sure. You are also on hydrochlorothiazide which can cause these changes but please keep taking this medication as it is helping keep your blood pressure under control.   I will let you know of any abnormal results.   Please follow up at your next scheduled appointment, if anything arises between now and then, please don't hesitate to contact our office.   Thank you for allowing [place] to be a part of your medical care!  Thank you, Dr. Larae Grooms

## 2021-12-09 NOTE — Assessment & Plan Note (Signed)
-  BP 110/72, at goal -continue HCTZ -repeated BMP to ensure hypokalemia resolved

## 2021-12-09 NOTE — Progress Notes (Signed)
° ° °  SUBJECTIVE:   CHIEF COMPLAINT / HPI:   Patient following from recent ED visit when she had a GI illness, last thing she ate was a piece of steak. She had abodminal pain that felt like she was in labor. She felt weak and thought she was going to faint but did not. She got fluids and got sent home later that night. She still was not feeling well so went ot the urgent care and they checked her stool which ended up being campylobacter positive. She was given an antibiotic course for 5 days and her symptoms improved. Continued bland diet and symptoms have improved. Drinking and eating back to her baseline. Denies abdominal pain or nausea. Denies hematochezia at any point in time.   PERTINENT  PMH / PSH:   Hypertension Patient denies chest pain and dyspnea. Complaint on antihypertensive regimen of HCTZ daily. She continues to maintain routine follow up with healthy weight and wellness and doing well.   Hypokalemia  K 3.1 noted on recent blood work while in the ED for viral GI illness. Given potassium supplementation to take at home which she has completed. Of note, patient taking HCTZ. Will repeat BMP to ensure resolution.   OBJECTIVE:   BP 110/72    Pulse 69    Ht 5' (1.524 m)    Wt 224 lb 9.6 oz (101.9 kg)    SpO2 99%    BMI 43.86 kg/m   General: Patient well-appearing, in no acute distress. HEENT: no evidence of cervical LAD CV: RRR, no murmurs or gallops auscultated Resp: CTAB, no wheezing, rales or rhonchi noted Abdomen: soft, nontender, nondistended, presence of bowel sounds Ext: no LE edema noted bilaterally Psych: mood appropriate, pleasant   ASSESSMENT/PLAN:   Essential hypertension -BP 110/72, at goal -continue HCTZ -repeated BMP to ensure hypokalemia resolved   GI illness   Hypokalemia -symptoms secondary to campylobacter resolved -K 3.1 with supplementation ordered, repeated BMP, hypokalemia may also be due to chronic HCTZ use, may need ongoing K  supplementation   -PHQ-9 score of 6 with negative question 9 reviewed.   Donney Dice, [place]

## 2021-12-10 ENCOUNTER — Encounter: Payer: Self-pay | Admitting: Family Medicine

## 2021-12-10 LAB — BASIC METABOLIC PANEL
BUN/Creatinine Ratio: 30 — ABNORMAL HIGH (ref 9–23)
BUN: 24 mg/dL (ref 6–24)
CO2: 23 mmol/L (ref 20–29)
Calcium: 9.2 mg/dL (ref 8.7–10.2)
Chloride: 105 mmol/L (ref 96–106)
Creatinine, Ser: 0.79 mg/dL (ref 0.57–1.00)
Glucose: 89 mg/dL (ref 70–99)
Potassium: 4.4 mmol/L (ref 3.5–5.2)
Sodium: 143 mmol/L (ref 134–144)
eGFR: 86 mL/min/{1.73_m2} (ref >59)

## 2021-12-18 ENCOUNTER — Ambulatory Visit (INDEPENDENT_AMBULATORY_CARE_PROVIDER_SITE_OTHER): Payer: Medicaid Other | Admitting: Physician Assistant

## 2021-12-30 ENCOUNTER — Ambulatory Visit (INDEPENDENT_AMBULATORY_CARE_PROVIDER_SITE_OTHER): Payer: Medicaid Other | Admitting: Physician Assistant

## 2022-01-05 ENCOUNTER — Ambulatory Visit (INDEPENDENT_AMBULATORY_CARE_PROVIDER_SITE_OTHER): Payer: Medicaid Other | Admitting: Physician Assistant

## 2022-01-05 ENCOUNTER — Other Ambulatory Visit: Payer: Self-pay

## 2022-01-05 ENCOUNTER — Encounter (INDEPENDENT_AMBULATORY_CARE_PROVIDER_SITE_OTHER): Payer: Self-pay | Admitting: Physician Assistant

## 2022-01-05 VITALS — BP 112/77 | HR 82 | Temp 97.6°F | Ht 60.0 in | Wt 217.0 lb

## 2022-01-05 DIAGNOSIS — E66813 Obesity, class 3: Secondary | ICD-10-CM

## 2022-01-05 DIAGNOSIS — Z6841 Body Mass Index (BMI) 40.0 and over, adult: Secondary | ICD-10-CM

## 2022-01-05 DIAGNOSIS — E669 Obesity, unspecified: Secondary | ICD-10-CM

## 2022-01-05 DIAGNOSIS — I10 Essential (primary) hypertension: Secondary | ICD-10-CM | POA: Diagnosis not present

## 2022-01-05 DIAGNOSIS — E559 Vitamin D deficiency, unspecified: Secondary | ICD-10-CM | POA: Diagnosis not present

## 2022-01-05 MED ORDER — VITAMIN D (ERGOCALCIFEROL) 1.25 MG (50000 UNIT) PO CAPS: ORAL_CAPSULE | ORAL | 0 refills | Status: DC

## 2022-01-05 MED ORDER — HYDROCHLOROTHIAZIDE 25 MG PO TABS: 25.0000 mg | ORAL_TABLET | Freq: Every day | ORAL | 0 refills | Status: DC

## 2022-01-05 NOTE — Progress Notes (Signed)
? ? ? ?Chief Complaint:  ? ?OBESITY ?April [name] is here to discuss her progress with her obesity treatment plan along with follow-up of her obesity related diagnoses. April [name] is on keeping a food journal and adhering to recommended goals of 1100-1200 calories and 80 grams of protein and states she is following her eating plan approximately 80% of the time. April [name] states she is walking for 30 minutes 2 times per week and stretching for 40 minutes 3 times per week. ? ?Today's visit was #: 46 ?Starting weight: 256 lbs ?Starting date: 06/13/2019 ?Today's weight: 217 lbs ?Today's date: 01/05/2022 ?Total lbs lost to date: 39 lbs ?Total lbs lost since last in-office visit: 0 ? ?Interim History: April [name] reports that she is emotional today due to having some issues at home. She is happy with maintaining her weight. She has been eating a lot of homemade [place] soup.  ? ?Subjective:  ? ?1. Vitamin D deficiency ?April [name] is currently taking Vitamin D every 10 days.  ? ?2. Essential hypertension ?April [name]'s blood pressure is controlled. She is on April [name] currently.  ? ?Assessment/Plan:  ? ?1. Vitamin D deficiency ?Low Vitamin D level contributes to fatigue and are associated with obesity, breast, and colon cancer. We will refill prescription Vitamin D 50,000 IU every 10 days and April [name] will follow-up for routine testing of Vitamin D, at least 2-3 times per year to avoid over-replacement. ? ?- Vitamin D, Ergocalciferol, (DRISDOL) 1.25 MG (50000 UNIT) CAPS capsule; One Capsule by mouth every 10 days  Dispense: 3 capsule; Refill: 0 ? ?2. Essential hypertension ?We will refill [name] for 3 months with no refills. April [name] is working on healthy weight loss and exercise to improve blood pressure control. We will watch for signs of hypotension as she continues her lifestyle modifications. ? ?- hydrochlorothiazide (HYDRODIURIL) 25 MG tablet; Take 1 tablet (25 mg total) by mouth daily.  Dispense: 90 tablet; Refill: 0 ? ?3. Obesity with  current BMI of 42.38 ?[name] is currently in the action stage of change. As such, her goal is to continue with weight loss efforts. She has agreed to keeping a food journal and adhering to recommended goals of 1100-1200 calories and 80 grams of protein daily.  ? ?Exercise goals:  As is.  ? ?Behavioral modification strategies: meal planning and cooking strategies, planning for success, and keeping a strict food journal. ? ?[name] has agreed to follow-up with our clinic in 3 weeks. She was informed of the importance of frequent follow-up visits to maximize her success with intensive lifestyle modifications for her multiple health conditions.  ? ?Objective:  ? ?Blood pressure 112/77, pulse 82, temperature 97.6 ?F (36.4 ?C), height 5' (1.524 m), weight 217 lb (98.4 kg), SpO2 99 %. ?Body mass index is 42.38 kg/m?. ? ?General: Cooperative, alert, well developed, in no acute distress. ?HEENT: Conjunctivae and lids unremarkable. ?Cardiovascular: Regular rhythm.  ?Lungs: Normal work of breathing. ?Neurologic: No focal deficits.  ? ?Lab Results  ?Component Value Date  ? CREATININE 0.79 12/09/2021  ? BUN 24 12/09/2021  ? NA 143 12/09/2021  ? K 4.4 12/09/2021  ? CL 105 12/09/2021  ? CO2 23 12/09/2021  ? ?Lab Results  ?Component Value Date  ? ALT 12 11/23/2021  ? AST 17 11/23/2021  ? ALKPHOS 100 11/23/2021  ? BILITOT 0.4 11/23/2021  ? ?Lab Results  ?Component Value Date  ? HGBA1C 5.6 11/06/2021  ? HGBA1C 5.5 04/29/2021  ? HGBA1C 7.3 (H) 04/07/2021  ? HGBA1C 5.3 11/06/2020  ? HGBA1C 5.4 07/10/2020  ? ?  Lab Results  ?Component Value Date  ? INSULIN 7.3 11/06/2021  ? INSULIN 3.6 04/07/2021  ? INSULIN 13.8 11/06/2020  ? INSULIN 11.6 07/10/2020  ? INSULIN 7.5 03/20/2020  ? ?Lab Results  ?Component Value Date  ? TSH 1.420 06/01/2018  ? ?Lab Results  ?Component Value Date  ? CHOL 231 (H) 11/06/2021  ? HDL 63 11/06/2021  ? LDLCALC 153 (H) 11/06/2021  ? LDLDIRECT 153 (H) 08/16/2020  ? TRIG 84 11/06/2021  ? CHOLHDL 3.7 11/06/2021   ? ?Lab Results  ?Component Value Date  ? VD25OH 79.9 11/06/2021  ? VD25OH 65.2 04/07/2021  ? VD25OH 61.9 11/06/2020  ? ?Lab Results  ?Component Value Date  ? WBC 9.3 11/23/2021  ? HGB 14.8 11/23/2021  ? HCT 44.3 11/23/2021  ? MCV 85.4 11/23/2021  ? PLT 305 11/23/2021  ? ?Lab Results  ?Component Value Date  ? IRON 36 (L) 12/11/2009  ? TIBC 318 12/11/2009  ? FERRITIN 19 12/11/2009  ? ?Attestation Statements:  ? ?Reviewed by clinician on day of visit: allergies, medications, problem list, medical history, surgical history, family history, social history, and previous encounter notes. ? ?I, Tonye Pearson, am acting as Location manager for [employer], PA-C. ? ?I have reviewed the above documentation for accuracy and completeness, and I agree with the above. Abby Potash, PA-C ? ?

## 2022-02-02 ENCOUNTER — Ambulatory Visit (INDEPENDENT_AMBULATORY_CARE_PROVIDER_SITE_OTHER): Payer: Medicaid Other | Admitting: Physician Assistant

## 2022-02-02 ENCOUNTER — Encounter (INDEPENDENT_AMBULATORY_CARE_PROVIDER_SITE_OTHER): Payer: Self-pay | Admitting: Physician Assistant

## 2022-02-02 VITALS — BP 109/74 | Ht 60.0 in | Wt 219.0 lb

## 2022-02-02 DIAGNOSIS — E669 Obesity, unspecified: Secondary | ICD-10-CM | POA: Diagnosis not present

## 2022-02-02 DIAGNOSIS — E559 Vitamin D deficiency, unspecified: Secondary | ICD-10-CM

## 2022-02-02 DIAGNOSIS — Z6841 Body Mass Index (BMI) 40.0 and over, adult: Secondary | ICD-10-CM | POA: Diagnosis not present

## 2022-02-05 ENCOUNTER — Other Ambulatory Visit: Payer: Self-pay | Admitting: Family Medicine

## 2022-02-05 DIAGNOSIS — M170 Bilateral primary osteoarthritis of knee: Secondary | ICD-10-CM

## 2022-02-08 NOTE — Progress Notes (Signed)
? ? ? ?Chief Complaint:  ? ?OBESITY ?April [name] is here to discuss her progress with her obesity treatment plan along with follow-up of her obesity related diagnoses. April [name] is on keeping a food journal and adhering to recommended goals of 1100-1200 calories and 80 grams of protein daily and states she is following her eating plan approximately 80% of the time. [name] states she is walking for 40 minutes 4 times per week. ? ?Today's visit was #: 28 ?Starting weight: 256 lbs ?Starting date: 06/13/2019 ?Today's weight: 219 lbs ?Today's date: 02/02/2022 ?Total lbs lost to date: 32 ?Total lbs lost since last in-office visit: 0 ? ?Interim History: April [name] has been stressed and she is not always hitting her protein goal. Some nights she is meal planning but she is inconsistent.  ? ?Subjective:  ? ?1. Vitamin D deficiency ?[name] is taking Vitamin D weekly as prescribed.  ? ?Assessment/Plan:  ? ?1. Vitamin D deficiency ?April [name] will continue prescription Vitamin D 50,000 IU every 10 days #3, and we will refill for 1 month.  ? ?2. Obesity with current BMI of 42.38 ?April [name] is currently in the action stage of change. As such, her goal is to continue with weight loss efforts. She has agreed to keeping a food journal and adhering to recommended goals of 1100-1200 calories and 80 grams of protein daily.  ? ?Exercise goals: As is. ? ?Behavioral modification strategies: increasing lean protein intake and meal planning and cooking strategies. ? ?April [name] has agreed to follow-up with our clinic in 3 weeks. She was informed of the importance of frequent follow-up visits to maximize her success with intensive lifestyle modifications for her multiple health conditions.  ? ?Objective:  ? ?Blood pressure 109/74, height 5' (1.524 m), weight 219 lb (99.3 kg). ?Body mass index is 42.77 kg/m?. ? ?General: Cooperative, alert, well developed, in no acute distress. ?HEENT: Conjunctivae and lids unremarkable. ?Cardiovascular: Regular  rhythm.  ?Lungs: Normal work of breathing. ?Neurologic: No focal deficits.  ? ?Lab Results  ?Component Value Date  ? CREATININE 0.79 12/09/2021  ? BUN 24 12/09/2021  ? NA 143 12/09/2021  ? K 4.4 12/09/2021  ? CL 105 12/09/2021  ? CO2 23 12/09/2021  ? ?Lab Results  ?Component Value Date  ? ALT 12 11/23/2021  ? AST 17 11/23/2021  ? ALKPHOS 100 11/23/2021  ? BILITOT 0.4 11/23/2021  ? ?Lab Results  ?Component Value Date  ? HGBA1C 5.6 11/06/2021  ? HGBA1C 5.5 04/29/2021  ? HGBA1C 7.3 (H) 04/07/2021  ? HGBA1C 5.3 11/06/2020  ? HGBA1C 5.4 07/10/2020  ? ?Lab Results  ?Component Value Date  ? INSULIN 7.3 11/06/2021  ? INSULIN 3.6 04/07/2021  ? INSULIN 13.8 11/06/2020  ? INSULIN 11.6 07/10/2020  ? INSULIN 7.5 03/20/2020  ? ?Lab Results  ?Component Value Date  ? TSH 1.420 06/01/2018  ? ?Lab Results  ?Component Value Date  ? CHOL 231 (H) 11/06/2021  ? HDL 63 11/06/2021  ? LDLCALC 153 (H) 11/06/2021  ? LDLDIRECT 153 (H) 08/16/2020  ? TRIG 84 11/06/2021  ? CHOLHDL 3.7 11/06/2021  ? ?Lab Results  ?Component Value Date  ? VD25OH 79.9 11/06/2021  ? VD25OH 65.2 04/07/2021  ? VD25OH 61.9 11/06/2020  ? ?Lab Results  ?Component Value Date  ? WBC 9.3 11/23/2021  ? HGB 14.8 11/23/2021  ? HCT 44.3 11/23/2021  ? MCV 85.4 11/23/2021  ? PLT 305 11/23/2021  ? ?Lab Results  ?Component Value Date  ? IRON 36 (L) 12/11/2009  ? TIBC 318 12/11/2009  ?  FERRITIN 19 12/11/2009  ? ?Attestation Statements:  ? ?Reviewed by clinician on day of visit: allergies, medications, problem list, medical history, surgical history, family history, social history, and previous encounter notes. ? ? ?I, Trixie Dredge, am acting as transcriptionist for Abby Potash, PA-C. ? ?I have reviewed the above documentation for accuracy and completeness, and I agree with the above. Abby Potash, PA-C ? ? ?

## 2022-02-09 ENCOUNTER — Other Ambulatory Visit: Payer: Self-pay | Admitting: Family Medicine

## 2022-02-19 MED ORDER — VITAMIN D (ERGOCALCIFEROL) 1.25 MG (50000 UNIT) PO CAPS: ORAL_CAPSULE | ORAL | 0 refills | Status: DC

## 2022-02-25 ENCOUNTER — Ambulatory Visit (INDEPENDENT_AMBULATORY_CARE_PROVIDER_SITE_OTHER): Payer: Medicaid Other | Admitting: Family Medicine

## 2022-02-25 ENCOUNTER — Encounter (INDEPENDENT_AMBULATORY_CARE_PROVIDER_SITE_OTHER): Payer: Self-pay | Admitting: Family Medicine

## 2022-02-25 VITALS — BP 105/72 | HR 62 | Temp 97.9°F | Ht 60.0 in | Wt 222.0 lb

## 2022-02-25 DIAGNOSIS — E669 Obesity, unspecified: Secondary | ICD-10-CM | POA: Diagnosis not present

## 2022-02-25 DIAGNOSIS — I10 Essential (primary) hypertension: Secondary | ICD-10-CM

## 2022-02-25 DIAGNOSIS — Z6841 Body Mass Index (BMI) 40.0 and over, adult: Secondary | ICD-10-CM

## 2022-02-25 DIAGNOSIS — E559 Vitamin D deficiency, unspecified: Secondary | ICD-10-CM | POA: Diagnosis not present

## 2022-03-05 DIAGNOSIS — M1712 Unilateral primary osteoarthritis, left knee: Secondary | ICD-10-CM | POA: Diagnosis not present

## 2022-03-11 NOTE — Progress Notes (Signed)
Chief Complaint:   OBESITY April [name] is here to discuss her progress with her obesity treatment plan along with follow-up of her obesity related diagnoses. April [name] is on keeping a food journal and adhering to recommended goals of 1100-1200 calories and 80 grams of protein daily and states she is following her eating plan approximately 85% of the time. April [name] states she is walking for 40 minutes 3-4 times per week.  Today's visit was #: 18 Starting weight: 256 lbs Starting date: 06/13/2019 Today's weight: 222 lbs Today's date: 02/25/2022 Total lbs lost to date: 34 Total lbs lost since last in-office visit: 0  Interim History: April [name] is working on her diet and weight loss. She may not be meeting her protein goals and this could be decreasing her RMR.   Subjective:   1. Vitamin D deficiency [name]'s Vitamin D level was not yet at goal. She requests a refill.   2. Essential hypertension April [name] has on hydrochlorothiazide, and her blood pressure is controlled.   Assessment/Plan:   1. Vitamin D deficiency We will refill prescription Vitamin D 50,000 IU every 10 days #3 for 1 month. We will recheck labs in 3 weeks.   2. Essential hypertension We will refill hydrochlorothiazide 25 mg q daily for 90 days, with no refills. We will recheck labs in 3 weeks.   3. Obesity, Current BMI 43.4 April [name] is currently in the action stage of change. As such, her goal is to continue with weight loss efforts. She has agreed to keeping a food journal and adhering to recommended goals of 1100-1200 calories and 80+ grams of protein daily.   Protein rich easy recipes were given today.   Exercise goals: As is.   Behavioral modification strategies: increasing lean protein intake.  April [name] has agreed to follow-up with our clinic in 3 weeks. She was informed of the importance of frequent follow-up visits to maximize her success with intensive lifestyle modifications for her multiple health  conditions.   Objective:   Blood pressure 105/72, pulse 62, temperature 97.9 F (36.6 C), height 5' (1.524 m), weight 222 lb (100.7 kg), SpO2 98 %. Body mass index is 43.36 kg/m.  General: Cooperative, alert, well developed, in no acute distress. HEENT: Conjunctivae and lids unremarkable. Cardiovascular: Regular rhythm.  Lungs: Normal work of breathing. Neurologic: No focal deficits.   Lab Results  Component Value Date   CREATININE 0.79 12/09/2021   BUN 24 12/09/2021   NA 143 12/09/2021   K 4.4 12/09/2021   CL 105 12/09/2021   CO2 23 12/09/2021   Lab Results  Component Value Date   ALT 12 11/23/2021   AST 17 11/23/2021   ALKPHOS 100 11/23/2021   BILITOT 0.4 11/23/2021   Lab Results  Component Value Date   HGBA1C 5.6 11/06/2021   HGBA1C 5.5 04/29/2021   HGBA1C 7.3 (H) 04/07/2021   HGBA1C 5.3 11/06/2020   HGBA1C 5.4 07/10/2020   Lab Results  Component Value Date   INSULIN 7.3 11/06/2021   INSULIN 3.6 04/07/2021   INSULIN 13.8 11/06/2020   INSULIN 11.6 07/10/2020   INSULIN 7.5 03/20/2020   Lab Results  Component Value Date   TSH 1.420 06/01/2018   Lab Results  Component Value Date   CHOL 231 (H) 11/06/2021   HDL 63 11/06/2021   LDLCALC 153 (H) 11/06/2021   LDLDIRECT 153 (H) 08/16/2020   TRIG 84 11/06/2021   CHOLHDL 3.7 11/06/2021   Lab Results  Component Value Date   VD25OH 79.9 11/06/2021  VD25OH 65.2 04/07/2021   VD25OH 61.9 11/06/2020   Lab Results  Component Value Date   WBC 9.3 11/23/2021   HGB 14.8 11/23/2021   HCT 44.3 11/23/2021   MCV 85.4 11/23/2021   PLT 305 11/23/2021   Lab Results  Component Value Date   IRON 36 (L) 12/11/2009   TIBC 318 12/11/2009   FERRITIN 19 12/11/2009   Attestation Statements:   Reviewed by clinician on day of visit: allergies, medications, problem list, medical history, surgical history, family history, social history, and previous encounter notes.   I, Trixie Dredge, am acting as transcriptionist  for Dennard Nip, MD.  I have reviewed the above documentation for accuracy and completeness, and I agree with the above. -  Dennard Nip, MD

## 2022-03-13 ENCOUNTER — Emergency Department (HOSPITAL_COMMUNITY)
Admission: EM | Admit: 2022-03-13 | Discharge: 2022-03-13 | Disposition: A | Discharge status: Other Acute Inpatient Hospital | Payer: Medicaid Other | Attending: Emergency Medicine | Admitting: Emergency Medicine

## 2022-03-13 ENCOUNTER — Ambulatory Visit (HOSPITAL_COMMUNITY)
Admission: EM | Admit: 2022-03-13 | Discharge: 2022-03-13 | Disposition: A | Discharge status: Other Acute Inpatient Hospital | Payer: Medicaid Other | Source: Home / Self Care

## 2022-03-13 ENCOUNTER — Ambulatory Visit (HOSPITAL_COMMUNITY)
Admission: RE | Admit: 2022-03-13 | Discharge: 2022-03-14 | Disposition: A | Discharge status: Home / Self Care | Payer: Medicaid Other | Source: Home / Self Care | Admitting: Psychiatry

## 2022-03-13 ENCOUNTER — Encounter (HOSPITAL_COMMUNITY): Payer: Self-pay

## 2022-03-13 ENCOUNTER — Other Ambulatory Visit: Payer: Self-pay

## 2022-03-13 ENCOUNTER — Telehealth (HOSPITAL_COMMUNITY): Payer: Self-pay

## 2022-03-13 DIAGNOSIS — Z20822 Contact with and (suspected) exposure to covid-19: Secondary | ICD-10-CM | POA: Insufficient documentation

## 2022-03-13 DIAGNOSIS — F23 Brief psychotic disorder: Secondary | ICD-10-CM | POA: Insufficient documentation

## 2022-03-13 DIAGNOSIS — F22 Delusional disorders: Secondary | ICD-10-CM | POA: Insufficient documentation

## 2022-03-13 DIAGNOSIS — Z01812 Encounter for preprocedural laboratory examination: Secondary | ICD-10-CM | POA: Insufficient documentation

## 2022-03-13 DIAGNOSIS — F99 Mental disorder, not otherwise specified: Secondary | ICD-10-CM | POA: Insufficient documentation

## 2022-03-13 DIAGNOSIS — F418 Other specified anxiety disorders: Secondary | ICD-10-CM | POA: Insufficient documentation

## 2022-03-13 DIAGNOSIS — F1210 Cannabis abuse, uncomplicated: Secondary | ICD-10-CM | POA: Insufficient documentation

## 2022-03-13 DIAGNOSIS — Z79899 Other long term (current) drug therapy: Secondary | ICD-10-CM | POA: Diagnosis not present

## 2022-03-13 DIAGNOSIS — F29 Unspecified psychosis not due to a substance or known physiological condition: Secondary | ICD-10-CM

## 2022-03-13 DIAGNOSIS — F1110 Opioid abuse, uncomplicated: Secondary | ICD-10-CM | POA: Insufficient documentation

## 2022-03-13 LAB — COMPREHENSIVE METABOLIC PANEL
ALT: 13 U/L (ref 0–44)
AST: 21 U/L (ref 15–41)
Albumin: 3.8 g/dL (ref 3.5–5.0)
Alkaline Phosphatase: 101 U/L (ref 38–126)
Anion gap: 11 (ref 5–15)
BUN: 16 mg/dL (ref 6–20)
CO2: 24 mmol/L (ref 22–32)
Calcium: 9.2 mg/dL (ref 8.9–10.3)
Chloride: 102 mmol/L (ref 98–111)
Creatinine, Ser: 0.67 mg/dL (ref 0.44–1.00)
GFR, Estimated: >60 mL/min (ref >60)
Glucose, Bld: 93 mg/dL (ref 70–99)
Potassium: 3.7 mmol/L (ref 3.5–5.1)
Sodium: 137 mmol/L (ref 135–145)
Total Bilirubin: 0.9 mg/dL (ref 0.3–1.2)
Total Protein: 7.7 g/dL (ref 6.5–8.1)

## 2022-03-13 LAB — CBC WITH DIFFERENTIAL/PLATELET
Abs Immature Granulocytes: 0.02 10*3/uL (ref 0.00–0.07)
Basophils Absolute: 0 10*3/uL (ref 0.0–0.1)
Basophils Relative: 0 %
Eosinophils Absolute: 0.1 10*3/uL (ref 0.0–0.5)
Eosinophils Relative: 1 %
HCT: 44 % (ref 36.0–46.0)
Hemoglobin: 14 g/dL (ref 12.0–15.0)
Immature Granulocytes: 0 %
Lymphocytes Relative: 37 %
Lymphs Abs: 3 10*3/uL (ref 0.7–4.0)
MCH: 28.6 pg (ref 26.0–34.0)
MCHC: 31.8 g/dL (ref 30.0–36.0)
MCV: 89.8 fL (ref 80.0–100.0)
Monocytes Absolute: 0.7 10*3/uL (ref 0.1–1.0)
Monocytes Relative: 9 %
Neutro Abs: 4.3 10*3/uL (ref 1.7–7.7)
Neutrophils Relative %: 53 %
Platelets: 296 10*3/uL (ref 150–400)
RBC: 4.9 MIL/uL (ref 3.87–5.11)
RDW: 15.3 % (ref 11.5–15.5)
WBC: 8.1 10*3/uL (ref 4.0–10.5)
nRBC: 0 % (ref 0.0–0.2)

## 2022-03-13 LAB — LIPID PANEL
Cholesterol: 198 mg/dL (ref 0–200)
HDL: 52 mg/dL (ref >40)
LDL Cholesterol: 129 mg/dL — ABNORMAL HIGH (ref 0–99)
Total CHOL/HDL Ratio: 3.8 RATIO
Triglycerides: 87 mg/dL (ref <150)
VLDL: 17 mg/dL (ref 0–40)

## 2022-03-13 LAB — POCT URINE DRUG SCREEN - MANUAL ENTRY (I-SCREEN)
POC Amphetamine UR: NOT DETECTED
POC Buprenorphine (BUP): NOT DETECTED
POC Cocaine UR: NOT DETECTED
POC Marijuana UR: POSITIVE — AB
POC Methadone UR: NOT DETECTED
POC Methamphetamine UR: NOT DETECTED
POC Morphine: NOT DETECTED
POC Oxazepam (BZO): NOT DETECTED
POC Oxycodone UR: POSITIVE — AB
POC Secobarbital (BAR): NOT DETECTED

## 2022-03-13 LAB — URINALYSIS, ROUTINE W REFLEX MICROSCOPIC
Bilirubin Urine: NEGATIVE
Glucose, UA: NEGATIVE mg/dL
Hgb urine dipstick: NEGATIVE
Ketones, ur: NEGATIVE mg/dL
Nitrite: NEGATIVE
Protein, ur: NEGATIVE mg/dL
Specific Gravity, Urine: 1.024 (ref 1.005–1.030)
pH: 6 (ref 5.0–8.0)

## 2022-03-13 LAB — RESP PANEL BY RT-PCR (FLU A&B, COVID) ARPGX2
Influenza A by PCR: NEGATIVE
Influenza B by PCR: NEGATIVE
SARS Coronavirus 2 by RT PCR: NEGATIVE

## 2022-03-13 LAB — TSH: TSH: 0.949 u[IU]/mL (ref 0.350–4.500)

## 2022-03-13 LAB — HEMOGLOBIN A1C
Hgb A1c MFr Bld: 5.5 % (ref 4.8–5.6)
Mean Plasma Glucose: 111.15 mg/dL

## 2022-03-13 LAB — POC SARS CORONAVIRUS 2 AG: SARSCOV2ONAVIRUS 2 AG: NEGATIVE

## 2022-03-13 LAB — ETHANOL: Alcohol, Ethyl (B): <10 mg/dL (ref <10)

## 2022-03-13 MED ORDER — OLANZAPINE 5 MG PO TBDP: 5.0000 mg | ORAL_TABLET | Freq: Every day | ORAL | Status: DC

## 2022-03-13 MED ORDER — ALUM & MAG HYDROXIDE-SIMETH 200-200-20 MG/5ML PO SUSP: 30.0000 mL | ORAL | Status: DC | PRN

## 2022-03-13 MED ORDER — MAGNESIUM HYDROXIDE 400 MG/5ML PO SUSP: 30.0000 mL | Freq: Every day | ORAL | Status: DC | PRN

## 2022-03-13 MED ORDER — TRAZODONE HCL 50 MG PO TABS
50.0000 mg | ORAL_TABLET | Freq: Every evening | ORAL | Status: DC | PRN
  Administered 2022-03-13: 50 mg via ORAL
  Filled 2022-03-13: qty 1

## 2022-03-13 MED ORDER — HYDROXYZINE HCL 25 MG PO TABS
25.0000 mg | ORAL_TABLET | Freq: Three times a day (TID) | ORAL | Status: DC | PRN
  Administered 2022-03-13 – 2022-03-14 (×2): 25 mg via ORAL
  Filled 2022-03-13 (×2): qty 1

## 2022-03-13 NOTE — ED Triage Notes (Signed)
Patient sent here by [facility] for labs to be drawn due to difficulty stick and unable to get labs.  Patient is IVC'd and has been accepted at [facility] pending labs. Patient is to be sent back after labs are drawn.

## 2022-03-13 NOTE — Progress Notes (Signed)
Patient has been denied by [facility] due to no appropriate beds available. Patient meets [facility] inpatient criteria per Merrily Brittle, DO. Patient has been faxed out to the following facilities:   [facility]  [place], [place] [address] [place]  [facility]  [address], [place] [address] [phone] [place]  [address], [place] [address] [phone]  [facility]  [address], [place] [address] [phone] [place]  [address] [place] [address] [phone]  [facility]  [address] [place] [address] [phone] [facility]  [place], [place] [address] [phone]  [facility]  [address] [place], [place] [address] [phone]  [facility]  [phone] [place] [address] [phone] [facility]  [place] [place] [address] [phone] [facility]  [address] [place] [address] [phone]  [facility]  [address], [place] [address] [place]  [facility]  [address], [place] [address] [phone] [place], MSW, LCSW-A  11:23 AM 03/13/2022

## 2022-03-13 NOTE — ED Notes (Signed)
Attempted blood collection x1 without return.  Second nursing staff notified of attempt and will attempt to collect sample.  Safety maintained.
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
## 2022-03-13 NOTE — ED Notes (Addendum)
Pt up to nurse's station requesting to speak with provider, "I need to speak with someone about the [place] lady that lied to me earlier, she needs to be fired. How you can lie to someone in a mental health place?" Pt also requesting medication to help her sleep. Leandro Reasoner, NP made aware.

## 2022-03-13 NOTE — ED Notes (Addendum)
Three RN's and 1 LPN attempted to draw labs on patient, all unsuccessful. Pt being sent to [name] ED for labs.   She has been accepted to [facility] in [place].

## 2022-03-13 NOTE — ED Notes (Addendum)
Pt up to nurse's station, very tearful and stating she does not want to be here. Pt states, "I drove myself here just to talk to somebody, that lady lied to me. I don't need to be here. They didn't explain nothing to me." Nursing staff provided reassurance and emotional support. Pt A&O x4, cooperative. Denies current SI/HI/AVH. Denies any current needs. No signs of acute distress noted. Will continue to monitor for safety.

## 2022-03-13 NOTE — ED Notes (Signed)
PB&J sandwich and chips given per pt request.

## 2022-03-13 NOTE — ED Notes (Signed)
[employer] notified to serve IVC paperwork and to transport patient to [facility] ED for labdraw.

## 2022-03-13 NOTE — ED Notes (Signed)
Pt oriented to unit, in bed 4. Denies any current SI/HI, patient noted to be talking to herself in the bathroom and appears to be responding to internal stimuli. Denies any anxiety/depression at present.

## 2022-03-13 NOTE — ED Provider Notes (Signed)
This Provider spoke with the [facility] [place]. Patient has been accepted for higher level of care, but will require blood work prior to her arrival. Patient's accepting provider will be Dr. Genia Del.  Number for RN report is [phone].   PGY-2 Damita Dunnings, MD

## 2022-03-13 NOTE — ED Triage Notes (Signed)
Pt presents to [name] seeking a mental health evaluation. Pt states she made an appointment upstairs in the outpatient but her appointment is not until July. Pt states that she has not been able to sleep for the past 3 years because her friend on is on the run from the [facility] and is now being held as a Doctor, general practice. Pt appears to be delusional and confused. Pt states " He was meant to fulfill a promise and when he sees me he is going to die because thats what they want". Pt states "I have a promise to get myself out of poverty because I was never meant to be in poverty". Pt tangential speech. Pt states " I just need to tell someone this and get some sleep".Pt denies SI/HI and AVH.

## 2022-03-13 NOTE — ED Notes (Addendum)
Pt's daughter Tod Persia in lobby requesting to speak to someone about pt. Pt has given verbal consent for staff to speak with daughter. Leandro Reasoner, NP aware. Consent to release information form signed by pt.
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
## 2022-03-13 NOTE — ED Provider Triage Note (Signed)
Emergency Medicine Provider Triage Evaluation Note  ALPHA CHOUINARD , a 61 y.o. female  was evaluated in triage.  Pt complains of needing lab drawn. Pt sent here from [facility] with IVC paper and have been accepted at [facility] pending labs.  Labs was unsuccessfully obtained at outside facility, therefore she was sent here just to have her labs drawn and pt is to be sent back to [facility] after labs are drawn.  Review of Systems  Positive: As above Negative: As above  Physical Exam  BP 111/77 (BP Location: Right Arm)   Pulse 68   Temp 98.6 F (37 C) (Oral)   Resp 16   Ht 5' (1.524 m)   Wt 100.7 kg   SpO2 100%   BMI 43.36 kg/m  Gen:   Awake, no distress    Resp:  Normal effort   MSK:   Moves extremities without difficulty   Other:     Medical Decision Making  Medically screening exam initiated at 3:43 PM.  Appropriate orders placed.  Kelleigh R Nelms was informed that the remainder of the evaluation will be completed by another provider, this initial triage assessment does not replace that evaluation, and the importance of remaining in the ED until their evaluation is complete.     Domenic Moras, PA-C 03/13/22 1545
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
## 2022-03-13 NOTE — ED Notes (Signed)
Returned from [facility] ED, labs drawn there, spoke with admissions coordinator Pamala Hurry at [facility] and she states they will hold the bed until morning for patient to come. Fax number for labwork to be faxed to (all blood work, covid, UDS, etc) is [phone].

## 2022-03-13 NOTE — ED Provider Notes (Signed)
Brief Point of Care Note  Patient is unable to be transferred to [facility] from [facility] ED.  Thamas Jaegers, MD requested transfer back to [facility] after blood draw.   Patient will return to [facility] and be transferred to [facility].   Signed: Merrily Brittle, DO Psychiatry Resident, PGY-1 [facility] 03/13/2022, 4:13 PM

## 2022-03-13 NOTE — ED Provider Notes (Addendum)
[place] EMERGENCY DEPARTMENT Provider Note   CSN: [id number] Arrival date & time: 03/13/22  1537     History  Chief Complaint  Patient presents with   Labs Only    April [name] is a 61 y.o. female.  Patient sent to the ER here for blood draw from her behavioral health facility.  She is on an IVC hold there but they are unable to draw blood and asked her to be sent here for blood draw.      Home Medications Prior to Admission medications   Medication Sig Start Date End Date Taking? Authorizing Provider  diclofenac Sodium (VOLTAREN) 1 % GEL APPLY 2 GRAMS TOPICALLY TO THE AFFECTED AREA FOUR TIMES DAILY Patient taking differently: 2 g 4 (four) times daily as needed (Apply to affected area for pain). 02/10/22   Ganta, Anupa, DO  hydrochlorothiazide (HYDRODIURIL) 25 MG tablet Take 1 tablet (25 mg total) by mouth daily. 01/05/22   Abby Potash, PA-C  traMADol (ULTRAM) 50 MG tablet TAKE 1 TABLET(50 MG) BY MOUTH EVERY 12 HOURS AS NEEDED Patient taking differently: Take 50 mg by mouth 2 (two) times daily as needed (For pain). 02/06/22   Donney Dice, DO  Vitamin D, Ergocalciferol, (DRISDOL) 1.25 MG (50000 UNIT) CAPS capsule One Capsule by mouth every 10 days Patient taking differently: Take 50,000 Units by mouth as directed. Take one capsule by mouth every 10 days. 02/19/22   Mellody Dance, DO      Allergies    Percocet [oxycodone-acetaminophen] and Naproxen    Review of Systems   Review of Systems  Constitutional:  Negative for fever.  HENT:  Negative for ear pain.   Eyes:  Negative for pain.  Respiratory:  Negative for cough.   Cardiovascular:  Negative for chest pain.  Gastrointestinal:  Negative for abdominal pain.  Genitourinary:  Negative for flank pain.  Musculoskeletal:  Negative for back pain.  Skin:  Negative for rash.  Neurological:  Negative for headaches.   Physical Exam Updated Vital Signs BP 111/77 (BP Location: Right Arm)   Pulse  68   Temp 98.6 F (37 C) (Oral)   Resp 16   Ht 5' (1.524 m)   Wt 100.7 kg   SpO2 100%   BMI 43.36 kg/m  Physical Exam Constitutional:      General: She is not in acute distress.    Appearance: Normal appearance.  HENT:     Head: Normocephalic.     Nose: Nose normal.  Eyes:     Extraocular Movements: Extraocular movements intact.  Cardiovascular:     Rate and Rhythm: Normal rate.  Pulmonary:     Effort: Pulmonary effort is normal.  Musculoskeletal:        General: Normal range of motion.     Cervical back: Normal range of motion.  Neurological:     General: No focal deficit present.     Mental Status: She is alert. Mental status is at baseline.    ED Results / Procedures / Treatments   Labs (all labs ordered are listed, but only abnormal results are displayed) Labs Reviewed  CBC WITH DIFFERENTIAL/PLATELET  COMPREHENSIVE METABOLIC PANEL  ETHANOL  HEMOGLOBIN A1C  LIPID PANEL  TSH  RPR    EKG None  Radiology No results found.  Procedures Procedures    Medications Ordered in ED Medications - No data to display  ED Course/ Medical Decision Making/ A&P  Medical Decision Making Amount and/or Complexity of Data Reviewed Labs: ordered.   Labs been sent pending results.  Once medically cleared to be transferred back to her facility.  Basic labs are sent these are unremarkable.  Patient medically cleared and transferred back to her facility.        Final Clinical Impression(s) / ED Diagnoses Final diagnoses:  Psychiatric illness    Rx / DC Orders ED Discharge Orders     None         Luna Fuse, MD 03/13/22 1603    Luna Fuse, MD 03/13/22 [phone]

## 2022-03-13 NOTE — ED Notes (Signed)
Pt asleep in bed. Respirations even and unlabored. Will continue to monitor for safety. ?

## 2022-03-13 NOTE — ED Provider Notes (Addendum)
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  9 - 23 Final   Sodium 11/06/2021 138  134 - 144 mmol/L Final   Potassium 11/06/2021 4.0  3.5 - 5.2 mmol/L Final   Chloride 11/06/2021 98  96 - 106 mmol/L Final   CO2 11/06/2021 25  20 - 29 mmol/L Final   Calcium 11/06/2021 9.8  8.7 - 10.2 mg/dL Final   Total Protein 11/06/2021 8.1  6.0 - 8.5 g/dL Final   Albumin 11/06/2021 4.7  3.8 - 4.9 g/dL Final   Globulin,  Total 11/06/2021 3.4  1.5 - 4.5 g/dL Final   Albumin/Globulin Ratio 11/06/2021 1.4  1.2 - 2.2 Final   Bilirubin Total 11/06/2021 0.3  0.0 - 1.2 mg/dL Final   Alkaline Phosphatase 11/06/2021 144 (H)  44 - 121 IU/L Final   AST 11/06/2021 15  0 - 40 IU/L Final   ALT 11/06/2021 8  0 - 32 IU/L Final   Hgb A1c MFr Bld 11/06/2021 5.6  4.8 - 5.6 % Final   Comment:  Prediabetes: 5.7 - 6.4          Diabetes: >6.4          Glycemic control for adults with diabetes: <7.0    Est. average glucose Bld gHb Est-m* 11/06/2021 114  mg/dL Final   INSULIN 11/06/2021 7.3  2.6 - 24.9 uIU/mL Final   Cholesterol, Total 11/06/2021 231 (H)  100 - 199 mg/dL Final   Triglycerides 11/06/2021 84  0 - 149 mg/dL Final   HDL 11/06/2021 63  >39 mg/dL Final   VLDL Cholesterol Cal 11/06/2021 15  5 - 40 mg/dL Final   LDL Chol Calc (NIH) 11/06/2021 153 (H)  0 - 99 mg/dL Final   Chol/HDL Ratio 11/06/2021 3.7  0.0 - 4.4 ratio Final   Comment:                                   T. Chol/HDL Ratio                                             Men  Women                               1/2 Avg.Risk  3.4    3.3                                   Avg.Risk  5.0    4.4                                2X Avg.Risk  9.6    7.1                                3X Avg.Risk 23.4   11.0    Vit D, 25-Hydroxy 11/06/2021 79.9  30.0 - 100.0 ng/mL Final   Comment: Vitamin D deficiency has been defined by the [place] practice guideline as a level of serum 25-OH vitamin D less than 20 ng/mL (1,2). The Endocrine Society went on to further define vitamin D insufficiency as a level between 21 and 29 ng/mL (2). 1. IOM (Institute of Medicine). 2010. Dietary reference    intakes for calcium and D. [place]: The    [employer]. 2. Holick MF, Binkley [place], Bischoff-Ferrari HA, et al.    Evaluation, treatment, and prevention of vitamin D    deficiency: an Endocrine Society clinical practice     guideline. JCEM. 2011 Jul; 96(7):1911-30.   Office Visit on 09/16/2021  Component Date Value Ref Range Status   High risk HPV 09/16/2021 Negative   Final   Adequacy 09/16/2021 Satisfactory for evaluation; transformation zone component ABSENT.   Final   Diagnosis 09/16/2021 - Negative for intraepithelial lesion or malignancy (NILM)   Final   Comment 09/16/2021 Normal Reference Range HPV - Negative   Final    Allergies: Percocet [oxycodone-acetaminophen] and Naproxen  PTA Medications: (Not in a hospital admission)   Medical Decision Making  Leya R Mcmeekin is a 61 y.o. female presented to [facility] (  03/13/2022) for psychosis and delusions.  She has command AH, VH, delusions, delusions (persecutory, grandiose, paranoid) and ideas of reference per above.  Also has a h/o substance abuse, including current daily THC. In the past she used cocaine and attended detox for >20years ago.   Psychosis, not stable Opiate and cannabis use Suspect 2/2 substance abuse give hx. UDS + cannabis and opiates.  Patient requires inpatient psych admission. Accepted at [facility] in [place]. Call back [phone]. Patient is medically stable. Patient is under IVC, by physician, 03/13/2022-03/20/2022  Medically STABLE for admission to inpatient psych Asymptomatic bacteruria, stable Not UTI, does not require antibiotics at this time. Leuks with rare bacteria on UA. Given age, no nitrites, no UTI sxs, most likely contaminated sample or chronic colonizer.    Recommendations  Based on my evaluation the patient does not appear to have an emergency medical condition.  Patient requires inpatient psych admission. Accepted at [name] in [place]. Call back [phone]. Patient is medically stable. Patient is under IVC, by physician, 03/13/2022-03/20/2022  Merrily Brittle, DO 03/13/22  1:20 PM

## 2022-03-13 NOTE — ED Notes (Signed)
Dr Almyra Free wants patient to stay until CBC and CMP are back to make sure no critical labs and If not patient can be sent back to [facility]

## 2022-03-13 NOTE — Progress Notes (Signed)
Patient has pending acceptance at [facility] pending labs. Patient is beiing transported to [facility] for lab work. 2nd shift CSW to follow-up to be sure labs are sent to [facility] and to receive acceptance information and to communicate with Rns and providers.    Mariea Clonts, MSW, LCSW-A  3:06 PM 03/13/2022

## 2022-03-13 NOTE — Telephone Encounter (Signed)
Patient walked in looking to see a therapist and psychiatrist. I advised that we did not have any appointments today (June 2nd) and that she could return for walk in hours if she would like to be seen today. We discussed, at length, how the walk in process works. She states that she just didn't understand how she could just be turned away. I offered an appointment (July 20th was next available). She stated that she could be dead by the time that came around and its too much stress to have to go back and forth to be possibly turned away. I advised that she be seen downstairs at [facility] for an assessment if she feels like she has an urgent need or if she feels she could do harm to herself or others. She settled with an appointment on 7/20 and she states she may come back this afternoon for a walk in appointment.

## 2022-03-13 NOTE — ED Notes (Signed)
Per Merrily Brittle DO and Dr. Serafina Mitchell MD patient is medically cleared to be discharged to [facility] in [place]. IVC paperwork, lab results and this note faxed to [phone] as requested.

## 2022-03-14 ENCOUNTER — Encounter (HOSPITAL_COMMUNITY): Payer: Self-pay | Admitting: Psychiatry

## 2022-03-14 DIAGNOSIS — F22 Delusional disorders: Secondary | ICD-10-CM

## 2022-03-14 HISTORY — DX: Delusional disorders: F22

## 2022-03-14 LAB — RPR: RPR Ser Ql: NONREACTIVE

## 2022-03-14 NOTE — ED Notes (Addendum)
Pt sitting in chair while speaking with this Probation officer.  Pt reports her night was stressfull and she was upset about being commited.   Pt denies auditory hallucinations, SI, HI but endorses visual hallucinations.  She states she sees people who need saved and her purpose is to save the world.  Pt also stated she understands she cannot save everyone and she is looking to move into the retirement phase of her life.  Breathing is even and unlabored.  Will continue to monitor for safety.

## 2022-03-14 NOTE — ED Notes (Signed)
Pt sitting in chair beside bed, appears to be having a full conversation with someone who is not there. Pt then came up to nurse's station requesting more medication. PRN Hydroxyzine administered without difficulty. Pt went to lay back down. No signs of acute distress noted. Will continue to monitor for safety.

## 2022-03-14 NOTE — ED Notes (Signed)
Called to update [place] with admissions at [facility] to let her know the patient is being discharged and will not be coming to admit there today.
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
## 2022-03-14 NOTE — ED Notes (Signed)
NP on unit to speak with pt.

## 2022-03-14 NOTE — ED Notes (Signed)
Took patient out side to the courtyard for a while she enjoyed it .

## 2022-03-14 NOTE — Discharge Instructions (Addendum)
    Walk in hours for medication management Monday, Wednesday, Thursday, and Friday from 8:00 AM to 11:00 AM Recommend arriving by by 7:30 AM.  It is first come first serve.    Walk in hours for therapy intake Monday and Wednesday only 8:00 AM to 11:00 AM Encouraged to arrive by 7:30 AM.  It is first come first serve

## 2022-03-14 NOTE — ED Notes (Signed)
Pt continues to request to speak with provider. Leandro Reasoner, NP made aware.

## 2022-03-16 ENCOUNTER — Telehealth: Payer: Self-pay

## 2022-03-16 MED ORDER — HYDROCHLOROTHIAZIDE 25 MG PO TABS: 25.0000 mg | ORAL_TABLET | Freq: Every day | ORAL | 0 refills | Status: DC

## 2022-03-16 MED ORDER — VITAMIN D (ERGOCALCIFEROL) 1.25 MG (50000 UNIT) PO CAPS: ORAL_CAPSULE | ORAL | 0 refills | Status: DC

## 2022-03-16 NOTE — Telephone Encounter (Signed)
Transition Care Management Follow-up Telephone Call Date of discharge and from where: 03/13/2022 from [facility] How have you been since you were released from the hospital? Patient stated that she is feeling much better and did not have any questions or concerns at this time.  Any questions or concerns? No  Items Reviewed: Did the pt receive and understand the discharge instructions provided? Yes  Medications obtained and verified? Yes  Other? No  Any new allergies since your discharge? No  Dietary orders reviewed? No Do you have support at home? Yes   Functional Questionnaire: (I = Independent and D = Dependent) ADLs: I  Bathing/Dressing- I  Meal Prep- I  Eating- I  Maintaining continence- I  Transferring/Ambulation- I  Managing Meds- I   Follow up appointments reviewed:  PCP Hospital f/u appt confirmed? Yes  Scheduled to see Donney Dice, DO on 03/26/2022 @ 02:10pm. [facility] f/u appt confirmed? Yes  Scheduled to see [facility] on 03/17/2022 @ 10:20 am. Are transportation arrangements needed? No  If their condition worsens, is the pt aware to call PCP or go to the Emergency Dept.? Yes Was the patient provided with contact information for the PCP's office or ED? Yes Was to pt encouraged to call back with questions or concerns? Yes

## 2022-03-17 ENCOUNTER — Ambulatory Visit (INDEPENDENT_AMBULATORY_CARE_PROVIDER_SITE_OTHER): Payer: Medicaid Other | Admitting: Family Medicine

## 2022-03-17 ENCOUNTER — Encounter: Payer: Self-pay | Admitting: *Deleted

## 2022-03-19 ENCOUNTER — Encounter (INDEPENDENT_AMBULATORY_CARE_PROVIDER_SITE_OTHER): Payer: Self-pay | Admitting: Nurse Practitioner

## 2022-03-19 ENCOUNTER — Ambulatory Visit (INDEPENDENT_AMBULATORY_CARE_PROVIDER_SITE_OTHER): Payer: Medicaid Other | Admitting: Family Medicine

## 2022-03-19 ENCOUNTER — Ambulatory Visit (INDEPENDENT_AMBULATORY_CARE_PROVIDER_SITE_OTHER): Payer: Medicaid Other | Admitting: Nurse Practitioner

## 2022-03-19 VITALS — BP 96/65 | HR 69 | Temp 98.1°F | Ht 60.0 in | Wt 218.0 lb

## 2022-03-19 DIAGNOSIS — E559 Vitamin D deficiency, unspecified: Secondary | ICD-10-CM

## 2022-03-19 DIAGNOSIS — Z6841 Body Mass Index (BMI) 40.0 and over, adult: Secondary | ICD-10-CM | POA: Diagnosis not present

## 2022-03-19 DIAGNOSIS — E669 Obesity, unspecified: Secondary | ICD-10-CM

## 2022-03-19 MED ORDER — VITAMIN D (ERGOCALCIFEROL) 1.25 MG (50000 UNIT) PO CAPS: ORAL_CAPSULE | ORAL | 0 refills | Status: DC

## 2022-03-24 NOTE — Progress Notes (Unsigned)
Chief Complaint:   OBESITY April [name] is here to discuss her progress with her obesity treatment plan along with follow-up of her obesity related diagnoses. April [name] is on the Category 2 Plan and states she is following her eating plan approximately 85% of the time. April [name] states she is walking 30-40 minutes 2-3 times per week.  Today's visit was #: 50 Starting weight: 256 lbs Starting date: 06/13/2019 Today's weight: 218 lbs Today's date: 03/19/2022 Total lbs lost to date: 38 lbs Total lbs lost since last in-office visit: 4  Interim History: April [name] is doing well since her last visit. She has been under stress since last visit. Her husband is sick. She follows Cat 2 plan and is journaling. She weighs her protein and gets in 53-80 grams + [phone] calories per day. Notes some days of hunger and cravings for chocolate.  Subjective:   1. Vitamin D deficiency April [name] is currently taking prescription Vit D 50,000 IU once every 10 days.  Assessment/Plan:   1. Vitamin D deficiency We will refill Vit D 50,000 IU every 10 days for 1 month with 0 refills.  -Refill Vitamin D, Ergocalciferol, (DRISDOL) 1.25 MG (50000 UNIT) CAPS capsule; One Capsule by mouth every 10 days  Dispense: 4 capsule; Refill: 0  2. Obesity, Current BMI 42.6 Labs reviewed with [facility] from 03/13/22.  April [name] is currently in the action stage of change. As such, her goal is to continue with weight loss efforts. She has agreed to the Category 2 Plan.   Exercise goals: As is.  Behavioral modification strategies: increasing water intake, no skipping meals, and planning for success.  April [name] has agreed to follow-up with our clinic in 3 weeks. She was informed of the importance of frequent follow-up visits to maximize her success with intensive lifestyle modifications for her multiple health conditions.   Objective:   Blood pressure 96/65, pulse 69, temperature 98.1 F (36.7 C), height 5' (1.524 m), weight 218  lb (98.9 kg), SpO2 98 %. Body mass index is 42.58 kg/m.  General: Cooperative, alert, well developed, in no acute distress. HEENT: Conjunctivae and lids unremarkable. Cardiovascular: Regular rhythm.  Lungs: Normal work of breathing. Neurologic: No focal deficits.   Lab Results  Component Value Date   CREATININE 0.67 03/13/2022   BUN 16 03/13/2022   NA 137 03/13/2022   K 3.7 03/13/2022   CL 102 03/13/2022   CO2 24 03/13/2022   Lab Results  Component Value Date   ALT 13 03/13/2022   AST 21 03/13/2022   ALKPHOS 101 03/13/2022   BILITOT 0.9 03/13/2022   Lab Results  Component Value Date   HGBA1C 5.5 03/13/2022   HGBA1C 5.6 11/06/2021   HGBA1C 5.5 04/29/2021   HGBA1C 7.3 (H) 04/07/2021   HGBA1C 5.3 11/06/2020   Lab Results  Component Value Date   INSULIN 7.3 11/06/2021   INSULIN 3.6 04/07/2021   INSULIN 13.8 11/06/2020   INSULIN 11.6 07/10/2020   INSULIN 7.5 03/20/2020   Lab Results  Component Value Date   TSH 0.949 03/13/2022   Lab Results  Component Value Date   CHOL 198 03/13/2022   HDL 52 03/13/2022   LDLCALC 129 (H) 03/13/2022   LDLDIRECT 153 (H) 08/16/2020   TRIG 87 03/13/2022   CHOLHDL 3.8 03/13/2022   Lab Results  Component Value Date   VD25OH 79.9 11/06/2021   VD25OH 65.2 04/07/2021   VD25OH 61.9 11/06/2020   Lab Results  Component Value Date   WBC 8.1 03/13/2022  HGB 14.0 03/13/2022   HCT 44.0 03/13/2022   MCV 89.8 03/13/2022   PLT 296 03/13/2022   Lab Results  Component Value Date   IRON 36 (L) 12/11/2009   TIBC 318 12/11/2009   FERRITIN 19 12/11/2009   Attestation Statements:   Reviewed by clinician on day of visit: allergies, medications, problem list, medical history, surgical history, family history, social history, and previous encounter notes.  I, Brendell Tyus, RMA, am acting as transcriptionist for Everardo Pacific, FNP..  I have reviewed the above documentation for accuracy and completeness, and I agree with the  above. Everardo Pacific, FNP

## 2022-03-26 ENCOUNTER — Ambulatory Visit: Payer: Medicaid Other | Admitting: Family Medicine

## 2022-03-31 ENCOUNTER — Telehealth (HOSPITAL_COMMUNITY): Payer: Self-pay | Admitting: Family Medicine

## 2022-03-31 NOTE — BH Assessment (Signed)
Care Management - [place] Follow Up Discharges   Writer attempted to make contact with patient today and was unsuccessful.  Writer left a HIPPA compliant voice message.   Per chart review, pt has a follow up appt with [name], LCSW on 05-05-22

## 2022-04-09 ENCOUNTER — Encounter (INDEPENDENT_AMBULATORY_CARE_PROVIDER_SITE_OTHER): Payer: Self-pay | Admitting: Nurse Practitioner

## 2022-04-09 ENCOUNTER — Ambulatory Visit (INDEPENDENT_AMBULATORY_CARE_PROVIDER_SITE_OTHER): Payer: Medicaid Other | Admitting: Nurse Practitioner

## 2022-04-09 VITALS — BP 121/83 | HR 65 | Temp 98.2°F | Ht 60.0 in | Wt 220.0 lb

## 2022-04-09 DIAGNOSIS — E669 Obesity, unspecified: Secondary | ICD-10-CM

## 2022-04-09 DIAGNOSIS — E559 Vitamin D deficiency, unspecified: Secondary | ICD-10-CM | POA: Diagnosis not present

## 2022-04-09 DIAGNOSIS — Z6841 Body Mass Index (BMI) 40.0 and over, adult: Secondary | ICD-10-CM

## 2022-04-09 MED ORDER — VITAMIN D (ERGOCALCIFEROL) 1.25 MG (50000 UNIT) PO CAPS: ORAL_CAPSULE | ORAL | 0 refills | Status: DC

## 2022-04-10 ENCOUNTER — Ambulatory Visit: Payer: Medicaid Other | Admitting: Family Medicine

## 2022-04-13 NOTE — Progress Notes (Signed)
Chief Complaint:   OBESITY April [name] is here to discuss her progress with her obesity treatment plan along with follow-up of her obesity related diagnoses. April [name] is on the Category 2 Plan and states she is following her eating plan approximately 80% of the time. April [name] states she is walking 40 minutes 2-3 times per week.  Today's visit was #: 63 Starting weight: 256 lbs Starting date: 06/13/2019 Today's weight: 220 lbs Today's date: 04/09/2022 Total lbs lost to date: 36 lbs Total lbs lost since last in-office visit: 0  Interim History: April [name] is struggling with emotional and stress eating. She is scheduled to see a therapist on 05/05/2022. She is doing well with Cat 2 plan. Struggles with snacking on peanut butter, pork skins, [place] bars, fudge pops and occ cake. Drinking water daily. Calories: [phone], Protein: 53-152.   Subjective:   1. Vitamin D deficiency April [name] is currently taking prescription Vit D 50,000 IU once a week. Denies any nausea, vomiting or muscle weakness.  Assessment/Plan:   1. Vitamin D deficiency We will refill Vit D 50,000 IU once a week for 1 month with no refills.  -Refill Vitamin D, Ergocalciferol, (DRISDOL) 1.25 MG (50000 UNIT) CAPS capsule; One Capsule by mouth every 10 days  Dispense: 4 capsule; Refill: 0  2. Obesity, Current BMI 43.0 April [name] is currently in the action stage of change. As such, her goal is to continue with weight loss efforts. She has agreed to the Category 2 Plan and keeping a food journal and adhering to recommended goals of 1100-1200 calories and 80+ grams of protein.   Exercise goals: As is.  Behavioral modification strategies: increasing lean protein intake, no skipping meals, planning for success, and keeping a strict food journal.  April [name] has agreed to follow-up with our clinic in 3 weeks. She was informed of the importance of frequent follow-up visits to maximize her success with intensive lifestyle modifications  for her multiple health conditions.   Objective:   Blood pressure 121/83, pulse 65, temperature 98.2 F (36.8 C), height 5' (1.524 m), weight 220 lb (99.8 kg), SpO2 98 %. Body mass index is 42.97 kg/m.  General: Cooperative, alert, well developed, in no acute distress. HEENT: Conjunctivae and lids unremarkable. Cardiovascular: Regular rhythm.  Lungs: Normal work of breathing. Neurologic: No focal deficits.   Lab Results  Component Value Date   CREATININE 0.67 03/13/2022   BUN 16 03/13/2022   NA 137 03/13/2022   K 3.7 03/13/2022   CL 102 03/13/2022   CO2 24 03/13/2022   Lab Results  Component Value Date   ALT 13 03/13/2022   AST 21 03/13/2022   ALKPHOS 101 03/13/2022   BILITOT 0.9 03/13/2022   Lab Results  Component Value Date   HGBA1C 5.5 03/13/2022   HGBA1C 5.6 11/06/2021   HGBA1C 5.5 04/29/2021   HGBA1C 7.3 (H) 04/07/2021   HGBA1C 5.3 11/06/2020   Lab Results  Component Value Date   INSULIN 7.3 11/06/2021   INSULIN 3.6 04/07/2021   INSULIN 13.8 11/06/2020   INSULIN 11.6 07/10/2020   INSULIN 7.5 03/20/2020   Lab Results  Component Value Date   TSH 0.949 03/13/2022   Lab Results  Component Value Date   CHOL 198 03/13/2022   HDL 52 03/13/2022   LDLCALC 129 (H) 03/13/2022   LDLDIRECT 153 (H) 08/16/2020   TRIG 87 03/13/2022   CHOLHDL 3.8 03/13/2022   Lab Results  Component Value Date   VD25OH 79.9 11/06/2021   VD25OH 65.2 04/07/2021  VD25OH 61.9 11/06/2020   Lab Results  Component Value Date   WBC 8.1 03/13/2022   HGB 14.0 03/13/2022   HCT 44.0 03/13/2022   MCV 89.8 03/13/2022   PLT 296 03/13/2022   Lab Results  Component Value Date   IRON 36 (L) 12/11/2009   TIBC 318 12/11/2009   FERRITIN 19 12/11/2009   Attestation Statements:   Reviewed by clinician on day of visit: allergies, medications, problem list, medical history, surgical history, family history, social history, and previous encounter notes.  I, Brendell Tyus, RMA, am acting  as transcriptionist for Everardo Pacific, FNP.  I have reviewed the above documentation for accuracy and completeness, and I agree with the above. Everardo Pacific, FNP

## 2022-04-23 ENCOUNTER — Other Ambulatory Visit: Payer: Self-pay | Admitting: Family Medicine

## 2022-04-23 DIAGNOSIS — M170 Bilateral primary osteoarthritis of knee: Secondary | ICD-10-CM

## 2022-04-27 ENCOUNTER — Encounter (HOSPITAL_COMMUNITY): Payer: Self-pay

## 2022-04-27 ENCOUNTER — Telehealth: Payer: Self-pay

## 2022-04-27 ENCOUNTER — Ambulatory Visit (HOSPITAL_COMMUNITY)
Admission: EM | Admit: 2022-04-27 | Discharge: 2022-04-27 | Disposition: A | Discharge status: Home / Self Care | Payer: Medicaid Other | Attending: Internal Medicine | Admitting: Internal Medicine

## 2022-04-27 ENCOUNTER — Encounter: Payer: Self-pay | Admitting: Family Medicine

## 2022-04-27 DIAGNOSIS — W57XXXA Bitten or stung by nonvenomous insect and other nonvenomous arthropods, initial encounter: Secondary | ICD-10-CM | POA: Diagnosis not present

## 2022-04-27 DIAGNOSIS — T7840XA Allergy, unspecified, initial encounter: Secondary | ICD-10-CM

## 2022-04-27 DIAGNOSIS — S20161A Insect bite (nonvenomous) of breast, right breast, initial encounter: Secondary | ICD-10-CM

## 2022-04-27 MED ORDER — FAMOTIDINE 20 MG PO TABS: 20.0000 mg | ORAL_TABLET | Freq: Every day | ORAL | 0 refills | Status: DC

## 2022-04-27 MED ORDER — HYDROCORTISONE 1 % EX CREA
TOPICAL_CREAM | CUTANEOUS | 0 refills | Status: DC
Start: 2022-04-27 — End: 2022-05-26

## 2022-04-27 MED ORDER — FAMOTIDINE 20 MG PO TABS
20.0000 mg | ORAL_TABLET | Freq: Once | ORAL | Status: AC
  Administered 2022-04-27: 20 mg via ORAL

## 2022-04-27 MED ORDER — CETIRIZINE HCL 10 MG PO TABS: 10.0000 mg | ORAL_TABLET | Freq: Every day | ORAL | 2 refills | Status: DC

## 2022-04-27 MED ORDER — FAMOTIDINE 20 MG PO TABS
ORAL_TABLET | ORAL | Status: AC
  Filled 2022-04-27: qty 1

## 2022-04-27 MED ORDER — METHYLPREDNISOLONE SODIUM SUCC 125 MG IJ SOLR
80.0000 mg | Freq: Once | INTRAMUSCULAR | Status: AC
  Administered 2022-04-27: 80 mg via INTRAMUSCULAR

## 2022-04-27 MED ORDER — METHYLPREDNISOLONE SODIUM SUCC 125 MG IJ SOLR
INTRAMUSCULAR | Status: AC
  Filled 2022-04-27: qty 2

## 2022-04-27 NOTE — Telephone Encounter (Signed)
Patient calls nurse line regarding tramadol refill. Patient reports that she takes this medication daily, as needed for knee pain.   Informed patient that refill had been denied, stating that medication was previously discontinued.   Patient states that she never discussed this with provider.   Please advise.   Talbot Grumbling, RN

## 2022-04-27 NOTE — Discharge Instructions (Addendum)
Use hydrocortisone to your breast insect bite twice daily for the next few days for itching related to the insect bite.  You may use this medicine up to 7 days since it is a steroid medication.  You were given a steroid injection in the clinic today.  This is going to help with the inflammatory response due to the allergic reaction.  Do not take any NSAID containing medications for 24 hours after receiving this injection.  You were given Pepcid medication in the clinic today.  Take this once daily as well as cetirizine once daily starting tomorrow for further relief of allergic reaction.  Take these medicines for the next 7 to 14 days.  This will also help with itch and swelling related to the allergic reaction.  If you develop any new or worsening symptoms or do not improve in the next 2 to 3 days, please return.  If your symptoms are severe, please go to the emergency room.  Follow-up with your primary care provider for further evaluation and management of your symptoms as well as ongoing wellness visits.  I hope you feel better!

## 2022-04-27 NOTE — ED Triage Notes (Signed)
Pt states stung by a bee 2hrs ago to both breast. Redness with heat noted to rt breast and lt breast has small redness.
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
## 2022-04-27 NOTE — Telephone Encounter (Signed)
Patient returns call to nurse line. Patient is requesting returned phone call from Dr. Larae Grooms to discuss further.   Please return call to patient at [phone].  Talbot Grumbling, RN

## 2022-04-28 NOTE — Telephone Encounter (Signed)
Patient calls nurse line again in regards to Tramadol.   Will forward to PCP.

## 2022-04-29 ENCOUNTER — Encounter (INDEPENDENT_AMBULATORY_CARE_PROVIDER_SITE_OTHER): Payer: Self-pay | Admitting: Nurse Practitioner

## 2022-04-29 ENCOUNTER — Ambulatory Visit (INDEPENDENT_AMBULATORY_CARE_PROVIDER_SITE_OTHER): Payer: Medicaid Other | Admitting: Nurse Practitioner

## 2022-04-29 VITALS — BP 109/74 | HR 70 | Temp 98.2°F | Ht 60.0 in | Wt 226.0 lb

## 2022-04-29 DIAGNOSIS — E559 Vitamin D deficiency, unspecified: Secondary | ICD-10-CM | POA: Diagnosis not present

## 2022-04-29 DIAGNOSIS — Z6841 Body Mass Index (BMI) 40.0 and over, adult: Secondary | ICD-10-CM | POA: Diagnosis not present

## 2022-04-29 DIAGNOSIS — E669 Obesity, unspecified: Secondary | ICD-10-CM

## 2022-04-29 MED ORDER — VITAMIN D (ERGOCALCIFEROL) 1.25 MG (50000 UNIT) PO CAPS: ORAL_CAPSULE | ORAL | 0 refills | Status: DC

## 2022-04-30 ENCOUNTER — Ambulatory Visit (HOSPITAL_COMMUNITY): Payer: Medicaid Other | Admitting: Licensed Clinical Social Worker

## 2022-05-04 NOTE — Progress Notes (Signed)
Chief Complaint:   OBESITY April [name] is here to discuss her progress with her obesity treatment plan along with follow-up of her obesity related diagnoses. April [name] is on the Category 2 Plan and states she is following her eating plan approximately 80% of the time. April [name] states she is walking 30 minutes 3 times per week.  Today's visit was #: 43 Starting weight: 256 lbs Starting date: 06/13/2019 Today's weight: 226 lbs Today's date: 04/29/2022 Total lbs lost to date: 30 lbs Total lbs lost since last in-office visit: 0  Interim History: April [name] went on vacation last week and reports, " I ate all the good stuff". Sometime she struggles with the cost of meats. She is a new therapist tomorrow. Drinking water daily but not enough. Interested in [place].  Subjective:   1. Vitamin D deficiency April [name] is currently taking prescription Vit D 50,000 IU once a week. Denies any nausea, vomiting or muscle weakness.  Assessment/Plan:   1. Vitamin D deficiency We will refill Vit D 50,000 IU once a week for 1 month with 0 refills. Need Vit D checked next 1-2 visits.  Low Vitamin D level contributes to fatigue and are associated with obesity, breast, and colon cancer. She agrees to continue to take prescription Vitamin D '@50'$ ,000 IU every week and will follow-up for routine testing of Vitamin D, at least 2-3 times per year to avoid over-replacement.   -Refill Vitamin D, Ergocalciferol, (DRISDOL) 1.25 MG (50000 UNIT) CAPS capsule; One Capsule by mouth every 10 days  Dispense: 4 capsule; Refill: 0  2. Obesity, Current BMI 44.3 April [name] is currently in the action stage of change. As such, her goal is to continue with weight loss efforts. She has agreed to the Category 2 Plan.   Exercise goals: As is.  Behavioral modification strategies: increasing lean protein intake, increasing water intake, and no skipping meals.  April [name] has agreed to follow-up with our clinic in 3 weeks. She was  informed of the importance of frequent follow-up visits to maximize her success with intensive lifestyle modifications for her multiple health conditions.   Information given on vegetarian plan.    Objective:   Blood pressure 109/74, pulse 70, temperature 98.2 F (36.8 C), height 5' (1.524 m), weight 226 lb (102.5 kg), SpO2 99 %. Body mass index is 44.14 kg/m.  General: Cooperative, alert, well developed, in no acute distress. HEENT: Conjunctivae and lids unremarkable. Cardiovascular: Regular rhythm.  Lungs: Normal work of breathing. Neurologic: No focal deficits.   Lab Results  Component Value Date   CREATININE 0.67 03/13/2022   BUN 16 03/13/2022   NA 137 03/13/2022   K 3.7 03/13/2022   CL 102 03/13/2022   CO2 24 03/13/2022   Lab Results  Component Value Date   ALT 13 03/13/2022   AST 21 03/13/2022   ALKPHOS 101 03/13/2022   BILITOT 0.9 03/13/2022   Lab Results  Component Value Date   HGBA1C 5.5 03/13/2022   HGBA1C 5.6 11/06/2021   HGBA1C 5.5 04/29/2021   HGBA1C 7.3 (H) 04/07/2021   HGBA1C 5.3 11/06/2020   Lab Results  Component Value Date   INSULIN 7.3 11/06/2021   INSULIN 3.6 04/07/2021   INSULIN 13.8 11/06/2020   INSULIN 11.6 07/10/2020   INSULIN 7.5 03/20/2020   Lab Results  Component Value Date   TSH 0.949 03/13/2022   Lab Results  Component Value Date   CHOL 198 03/13/2022   HDL 52 03/13/2022   LDLCALC 129 (H) 03/13/2022  LDLDIRECT 153 (H) 08/16/2020   TRIG 87 03/13/2022   CHOLHDL 3.8 03/13/2022   Lab Results  Component Value Date   VD25OH 79.9 11/06/2021   VD25OH 65.2 04/07/2021   VD25OH 61.9 11/06/2020   Lab Results  Component Value Date   WBC 8.1 03/13/2022   HGB 14.0 03/13/2022   HCT 44.0 03/13/2022   MCV 89.8 03/13/2022   PLT 296 03/13/2022   Lab Results  Component Value Date   IRON 36 (L) 12/11/2009   TIBC 318 12/11/2009   FERRITIN 19 12/11/2009   Attestation Statements:   Reviewed by clinician on day of visit:  allergies, medications, problem list, medical history, surgical history, family history, social history, and previous encounter notes.  I, Brendell Tyus, RMA, am acting as transcriptionist for Everardo Pacific, FNP.  I have reviewed the above documentation for accuracy and completeness, and I agree with the above. Everardo Pacific, FNP

## 2022-05-05 ENCOUNTER — Ambulatory Visit (INDEPENDENT_AMBULATORY_CARE_PROVIDER_SITE_OTHER): Payer: Medicaid Other | Admitting: Licensed Clinical Social Worker

## 2022-05-05 ENCOUNTER — Ambulatory Visit: Payer: Medicaid Other | Admitting: Family Medicine

## 2022-05-05 ENCOUNTER — Encounter (HOSPITAL_COMMUNITY): Payer: Self-pay | Admitting: Licensed Clinical Social Worker

## 2022-05-05 ENCOUNTER — Encounter (HOSPITAL_COMMUNITY): Payer: Self-pay

## 2022-05-05 DIAGNOSIS — F22 Delusional disorders: Secondary | ICD-10-CM | POA: Diagnosis not present

## 2022-05-05 NOTE — Plan of Care (Signed)
Pt verbally agreeable to plan.
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
## 2022-05-11 ENCOUNTER — Ambulatory Visit (INDEPENDENT_AMBULATORY_CARE_PROVIDER_SITE_OTHER): Payer: Medicaid Other | Admitting: Student in an Organized Health Care Education/Training Program

## 2022-05-11 ENCOUNTER — Encounter (HOSPITAL_COMMUNITY): Payer: Self-pay | Admitting: Student in an Organized Health Care Education/Training Program

## 2022-05-11 VITALS — BP 131/82 | HR 68 | Wt 226.4 lb

## 2022-05-11 DIAGNOSIS — F1220 Cannabis dependence, uncomplicated: Secondary | ICD-10-CM | POA: Diagnosis not present

## 2022-05-11 DIAGNOSIS — F22 Delusional disorders: Secondary | ICD-10-CM | POA: Diagnosis not present

## 2022-05-11 MED ORDER — HALOPERIDOL 1 MG PO TABS: 1.0000 mg | ORAL_TABLET | Freq: Two times a day (BID) | ORAL | 2 refills | Status: DC

## 2022-05-11 MED ORDER — BENZTROPINE MESYLATE 1 MG PO TABS: 1.0000 mg | ORAL_TABLET | Freq: Every day | ORAL | 2 refills | Status: DC
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
## 2022-05-12 ENCOUNTER — Ambulatory Visit (INDEPENDENT_AMBULATORY_CARE_PROVIDER_SITE_OTHER): Payer: Medicaid Other | Admitting: Family Medicine

## 2022-05-12 ENCOUNTER — Encounter: Payer: Self-pay | Admitting: Family Medicine

## 2022-05-12 VITALS — BP 113/72 | HR 72 | Ht 60.0 in | Wt 226.2 lb

## 2022-05-12 DIAGNOSIS — M170 Bilateral primary osteoarthritis of knee: Secondary | ICD-10-CM

## 2022-05-12 DIAGNOSIS — I10 Essential (primary) hypertension: Secondary | ICD-10-CM | POA: Diagnosis not present

## 2022-05-12 NOTE — Assessment & Plan Note (Signed)
-  physical exam unremarkable, patient has maintained appropriate quality of life without tramadol for the past few weeks, expressed to patient that tramadol is not the healthiest option for her and she agreed. Through shared decision making, will hold off on tramadol for now as patient has tolerated knee pain well and able to stay active. Explained to patient that since her knee pain started and when she started taking tramadol, she has come a long way with her lifestyle modifications which I reassured her that this has improved her pain, she agrees with plan -PT referral placed -follow up in 2 months, may consider tramadol at lower dose at that time

## 2022-05-12 NOTE — Progress Notes (Signed)
    SUBJECTIVE:   CHIEF COMPLAINT / HPI:   Patient presents for medication and mood follow up. Recently been to the ED and seeing behavioral health. Recently prescribed haloperidol and benztropine which she is doing well with. Still going to healthy weight and wellness, doing well with that. She recently went to [facility] and sticking to her meal plan pretty well. Denies chest pain, dyspnea or leg swelling. Her knee hurts and she was previously taking tramadol which was discontinued by another provider. Sometimes her left knee will get stiff when she goes upstairs, she is trying to lose more weight. Rates pain as a 7/10 and is a 9/10 at its worst. Still able to do all activities and exercising.   OBJECTIVE:   BP 113/72   Pulse 72   Ht 5' (1.524 m)   Wt 226 lb 4 oz (102.6 kg)   SpO2 99%   BMI 44.19 kg/m   General: Patient well-appearing, in no acute distress. CV: RRR, no murmurs or gallops auscultated Resp: CTAB, no wheezing, rales or rhonchi Ext: no LE edema noted bilaterally, no edema or erythema noted on inspection of knees bilaterally, non-tender to palpation, full active ROM of knees bilaterally, distal pulses strong and equal bilaterally  Neuro: 5/5 LE strength bilaterally  ASSESSMENT/PLAN:   Essential hypertension -BP 113/72, well-controlled -continue HCTZ -diet and exercise counseling   Osteoarthritis of knees, bilateral -physical exam unremarkable, patient has maintained appropriate quality of life without tramadol for the past few weeks, expressed to patient that tramadol is not the healthiest option for her and she agreed. Through shared decision making, will hold off on tramadol for now as patient has tolerated knee pain well and able to stay active. Explained to patient that since her knee pain started and when she started taking tramadol, she has come a long way with her lifestyle modifications which I reassured her that this has improved her pain, she agrees with  plan -PT referral placed -follow up in 2 months, may consider tramadol at lower dose at that time     -PHQ-9 score of 4 with negative question 9 reviewed and discussed. -GAD-7 score of 1 reviewed and discussed.     Donney Dice, [place]

## 2022-05-12 NOTE — Patient Instructions (Signed)
It was great seeing you today!  Today we discussed many things, I am glad that the medications are helping your mood. Please make sure to follow up with behavioral health for these medications.   Your blood pressure looks great at 113/72, please continue to take all your medications.   I have placed a referral for physical therapy, this should help your knees.   Please follow up at your next scheduled appointment in 2 months, if anything arises between now and then, please don't hesitate to contact our office.   Thank you for allowing [place] to be a part of your medical care!  Thank you, Dr. Larae Grooms

## 2022-05-12 NOTE — Assessment & Plan Note (Signed)
-  BP 113/72, well-controlled -continue HCTZ -diet and exercise counseling

## 2022-05-18 ENCOUNTER — Encounter (HOSPITAL_COMMUNITY): Payer: Medicaid Other | Admitting: Student in an Organized Health Care Education/Training Program

## 2022-05-19 ENCOUNTER — Ambulatory Visit: Payer: Medicaid Other | Attending: Family Medicine | Admitting: Physical Therapy

## 2022-05-19 ENCOUNTER — Encounter: Payer: Self-pay | Admitting: Physical Therapy

## 2022-05-19 ENCOUNTER — Other Ambulatory Visit: Payer: Self-pay

## 2022-05-19 DIAGNOSIS — M6281 Muscle weakness (generalized): Secondary | ICD-10-CM

## 2022-05-19 DIAGNOSIS — R2681 Unsteadiness on feet: Secondary | ICD-10-CM | POA: Diagnosis not present

## 2022-05-19 DIAGNOSIS — M170 Bilateral primary osteoarthritis of knee: Secondary | ICD-10-CM | POA: Diagnosis not present

## 2022-05-19 DIAGNOSIS — M25562 Pain in left knee: Secondary | ICD-10-CM
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
## 2022-05-20 ENCOUNTER — Encounter (INDEPENDENT_AMBULATORY_CARE_PROVIDER_SITE_OTHER): Payer: Self-pay

## 2022-05-22 ENCOUNTER — Other Ambulatory Visit: Payer: Self-pay | Admitting: Obstetrics and Gynecology

## 2022-05-22 DIAGNOSIS — I10 Essential (primary) hypertension: Secondary | ICD-10-CM

## 2022-05-22 NOTE — Patient Outreach (Addendum)
Medicaid Managed Care   Nurse Care Manager Note  05/22/2022 Name:  April [name] MRN:  [id number] DOB:  08-Apr-1961  April [name] is an 61 y.o. year old female who is a primary patient of [place], [place], DO.  The [facility] Managed Care Coordination team was consulted for assistance with:    Healthcare management needs, HTN, GERD, osteoarthritis LBP, obesity, anxiety, depression,  SDOH  April [name] was given information about Medicaid Managed Care Coordination team services today. [name] Patient agreed to services and verbal consent obtained.  Engaged with patient by telephone for initial visit in response to provider referral for case management and/or care coordination services.   Assessments/Interventions:  Review of past medical history, allergies, medications, health status, including review of consultants reports, laboratory and other test data, was performed as part of comprehensive evaluation and provision of chronic care management services.  SDOH (Social Determinants of Health) assessments and interventions performed: SDOH Interventions    Flowsheet Row Most Recent Value  SDOH Interventions   Food Insecurity Interventions Other (Comment)  [Care Guide referral]  Transportation Interventions Intervention Not Indicated     Care Plan  Allergies  Allergen Reactions   Percocet [Oxycodone-Acetaminophen] Swelling   Naproxen Palpitations   Medications Reviewed Today     Reviewed by [name], RN (Registered Nurse) on 05/22/22 at [place] List Status: <None>   Medication Order Taking? Sig Documenting Provider Last Dose Status Informant  benztropine (COGENTIN) 1 MG tablet [id number] Yes Take 1 tablet (1 mg total) by mouth daily. Freida Busman, MD Taking Active   cetirizine (ZYRTEC) 10 MG tablet [id number] No Take 1 tablet (10 mg total) by mouth daily.  Patient not taking: Reported on 05/22/2022   Talbot Grumbling, FNP Not Taking Active   diclofenac  Sodium (VOLTAREN) 1 % GEL [id number] Yes APPLY 2 GRAMS TOPICALLY TO THE AFFECTED AREA FOUR TIMES DAILY  Patient taking differently: 2 g 4 (four) times daily as needed (Apply to affected area for pain).   Donney Dice, DO Taking Active Self  famotidine (PEPCID) 20 MG tablet [id number] No Take 1 tablet (20 mg total) by mouth daily.  Patient not taking: Reported on 05/22/2022   Talbot Grumbling, FNP Not Taking Active   haloperidol (HALDOL) 1 MG tablet [id number] Yes Take 1 tablet (1 mg total) by mouth 2 (two) times daily. Freida Busman, MD Taking Active   hydrochlorothiazide (HYDRODIURIL) 25 MG tablet [id number] Yes Take 1 tablet (25 mg total) by mouth daily. Dennard Nip D, MD Taking Active   hydrocortisone cream 1 % [id number] No Apply to affected area 2 times daily  Patient not taking: Reported on 05/22/2022   Talbot Grumbling, FNP Not Taking Active   Vitamin D, Ergocalciferol, (DRISDOL) 1.25 MG (50000 UNIT) CAPS capsule [id number] Yes One Capsule by mouth every 10 days Everardo Pacific, FNP Taking Active            Patient Active Problem List   Diagnosis Date Noted   Delusional disorder ([place]) 03/14/2022   Pap smear for cervical cancer screening 09/16/2021   Tension headache 01/01/2021   Essential hypertension 08/15/2020   Insulin resistance 07/11/2019   Class 3 severe obesity with serious comorbidity and body mass index (BMI) of 50.0 to 59.9 in adult [facility]) 07/11/2019   Vitamin D deficiency 03/15/2019   Vitiligo 03/03/2018   Insomnia 11/05/2017   Fatigue 03/02/2016   Osteoarthritis of knees, bilateral 09/23/2015   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  Low back pain 01/27/2012   GERD 03/22/2009   Anxiety state 11/14/2008   Eczema 05/18/2008   Depression 03/10/2007   HYPERCHOLESTEROLEMIA 12/09/2006   Anemia 12/09/2006   Conditions to be addressed/monitored per PCP order:  Healthcare management needs, HTN, GERD, osteoarthritis LBP, obesity, anxiety, depression,  SDOH  Care Plan : RN Care Manager Plan of Care  Updates made by [name], RN since 05/22/2022 12:00 AM     Problem: Health Promotion or Disease Self-Management (General Plan of Care)      Long-Range Goal: Chronic Disease Management and Care Coordination Needs/SDOH   Start Date: 05/22/2022  Expected End Date: 08/22/2022  Priority: High  Note:   Current Barriers:  Care Coordination needs related to Limited access to food resources and utility resources.  RNCM Clinical Goal(s):  Patient will take all medications exactly as prescribed and will call provider for medication related questions as evidenced by patient report demonstrate understanding of rationale for each prescribed medication as evidenced by patient report attend all scheduled medical appointments as evidenced by patient report continue to work with RN Care Manager to address care management and care coordination needs related to SDOH as evidenced by adherence to CM Team Scheduled appointments work with community resource care guide to address needs related to  Limited access to food and utility resources as evidenced by patient and/or community resource care guide support through collaboration with Consulting civil engineer, provider, and care team.   Interventions: Inter-disciplinary care team collaboration (see longitudinal plan of care) Evaluation of current treatment plan related to  self management and patient's adherence to plan as established by provider Care Guide referral for utility resources and food resources Collaborated with Care Guide for food and utility resources  SDOH Barriers (Status:  New goal.) Long Term Goal Patient interviewed and SDOH assessment performed        SDOH Interventions    Flowsheet Row Most Recent Value  SDOH Interventions   Food Insecurity Interventions Other (Comment)  [Care Guide referral]  Transportation Interventions Intervention Not Indicated     Patient interviewed and  appropriate assessments performed Referred patient to community resources care guide team for assistance with food and utility resources. Discussed plans with patient for ongoing care management follow up and provided patient with direct contact information for care management team  Patient Goals/Self-Care Activities: Take all medications as prescribed Attend all scheduled provider appointments Call pharmacy for medication refills 3-7 days in advance of running out of medications Perform all self care activities independently  Perform IADL's (shopping, preparing meals, housekeeping, managing finances) independently Call provider office for new concerns or questions   Follow Up Plan:  The patient has been provided with contact information for the care management team and has been advised to call with any health related questions or concerns.  The care management team will reach out to the patient again over the next 30 business  days.   Long-Range Goal: Establish Plan of Care for Chronic Disease Management Needs/SDOH   Priority: High  Note:   Timeframe:  Long-Range Goal Priority:  High Start Date:         05/22/22                    Expected End Date:  ongoing                   Follow Up Date 07/02/22    - practice safe sex - schedule appointment for flu shot -  schedule appointment for vaccines needed due to my age or health - schedule recommended health tests (blood work, mammogram, colonoscopy, pap test) - schedule and keep appointment for annual check-up    Why is this important?   Screening tests can find diseases early when they are easier to treat.  Your doctor or nurse will talk with you about which tests are important for you.  Getting shots for common diseases like the flu and shingles will help prevent them.     Follow Up:  Patient agrees to Care Plan and Follow-up.  Plan: The Managed Medicaid care management team will reach out to the patient again over the next 30  business  days. and The  Patient has been provided with contact information for the Managed Medicaid care management team and has been advised to call with any health related questions or concerns.  Date/time of next scheduled RN care management/care coordination outreach:  07/02/22 at 230

## 2022-05-22 NOTE — Patient Instructions (Signed)
Hi April [name], thank you for speaking with me today, have a great afternoon!  April [name] was given information about Medicaid Managed Care team care coordination services as a part of their Healthy [facility] Medicaid benefit. April [name] verbally consented to engagement with the [facility] Managed Care team.   If you are experiencing a medical emergency, please call 911 or report to your local emergency department or urgent care.   If you have a non-emergency medical problem during routine business hours, please contact your provider's office and ask to speak with a nurse.   For questions related to your Healthy [facility] health plan, please call: [phone] or visit the homepage here: [email or web address]  If you would like to schedule transportation through your Healthy [facility] plan, please call the following number at least 2 days in advance of your appointment: [phone]  For information about your ride after you set it up, call Ride Assist at [phone]. Use this number to activate a Will Call pickup, or if your transportation is late for a scheduled pickup. Use this number, too, if you need to make a change or cancel a previously scheduled reservation.  If you need transportation services right away, call [phone]. The after-hours call center is staffed 24 hours to handle ride assistance and urgent reservation requests (including discharges) 365 days a year. Urgent trips include sick visits, hospital discharge requests and life-sustaining treatment.  Call the [place] at [phone], at any time, 24 hours a day, 7 days a week. If you are in danger or need immediate medical attention call 911.  If you would like help to quit smoking, call 1-800-QUIT-NOW 858-275-7419) OR Espaol: 1-855-Djelo-Ya (3-545-625-6389) o para ms informacin haga clic aqu or Text READY to 200-400 to register via text  April [name] -  following are the goals we discussed in your visit today:   Goals Addressed    Note:   Timeframe:  Long-Range Goal Priority:  High Start Date:         05/22/22                    Expected End Date:  ongoing                   Follow Up Date 07/01/22    - practice safe sex - schedule appointment for flu shot - schedule appointment for vaccines needed due to my age or health - schedule recommended health tests (blood work, mammogram, colonoscopy, pap test) - schedule and keep appointment for annual check-up    Why is this important?   Screening tests can find diseases early when they are easier to treat.  Your doctor or nurse will talk with you about which tests are important for you.  Getting shots for common diseases like the flu and shingles will help prevent them.     Patient verbalizes understanding of instructions and care plan provided today and agrees to view in [place]. Active MyChart status and patient understanding of how to access instructions and care plan via MyChart confirmed with patient.     The Managed Medicaid care management team will reach out to the patient again over the next 30 business  days.  The  Patient has been provided with contact information for the Managed Medicaid care management team and has been advised to call with any health related questions or concerns.   April Raider RN, BSN [place]  [facility] [employer] -  Managed Medicaid High Risk [phone]   Following is a copy of your plan of care:  Care Plan : [place] of Care  Updates made by April [name], RN since 05/22/2022 12:00 AM     Problem: Health Promotion or Disease Self-Management (General Plan of Care)      Long-Range Goal: Chronic Disease Management and Care Coordination Needs/SDOH   Start Date: 05/22/2022  Expected End Date: 08/22/2022  Priority: High  Note:   Current Barriers:  Care Coordination needs related to Limited access to  food resources and utility resources.  RNCM Clinical Goal(s):  Patient will take all medications exactly as prescribed and will call provider for medication related questions as evidenced by patient report demonstrate understanding of rationale for each prescribed medication as evidenced by patient report attend all scheduled medical appointments as evidenced by patient report continue to work with RN Care Manager to address care management and care coordination needs related to SDOH as evidenced by adherence to CM Team Scheduled appointments work with community resource care guide to address needs related to  Limited access to food and utility resources as evidenced by patient and/or community resource care guide support through collaboration with Consulting civil engineer, provider, and care team.   Interventions: Inter-disciplinary care team collaboration (see longitudinal plan of care) Evaluation of current treatment plan related to  self management and patient's adherence to plan as established by provider Care Guide referral for utility resources and food resources Collaborated with Care Guide for food and utility resources  SDOH Barriers (Status:  New goal.) Long Term Goal Patient interviewed and SDOH assessment performed        SDOH Interventions    Flowsheet Row Most Recent Value  SDOH Interventions   Food Insecurity Interventions Other (Comment)  [Care Guide referral]  Transportation Interventions Intervention Not Indicated     Patient interviewed and appropriate assessments performed Referred patient to community resources care guide team for assistance with food and utility resources. Discussed plans with patient for ongoing care management follow up and provided patient with direct contact information for care management team  Patient Goals/Self-Care Activities: Take all medications as prescribed Attend all scheduled provider appointments Call pharmacy for medication refills 3-7  days in advance of running out of medications Perform all self care activities independently  Perform IADL's (shopping, preparing meals, housekeeping, managing finances) independently Call provider office for new concerns or questions   Follow Up Plan:  The patient has been provided with contact information for the care management team and has been advised to call with any health related questions or concerns.  The care management team will reach out to the patient again over the next 30 business  days.

## 2022-05-25 ENCOUNTER — Ambulatory Visit: Payer: Medicaid Other

## 2022-05-25 ENCOUNTER — Telehealth: Payer: Self-pay

## 2022-05-25 NOTE — Telephone Encounter (Signed)
   Telephone encounter was:  Successful.  05/25/2022 Name: April [name] MRN: [id number] DOB: [birth year]  [name] is a 61 y.o. year old female who is a primary care patient of [place], [place], DO . The community resource team was consulted for assistance with Food Insecurity and Financial Difficulties related to utilities.  Care guide performed the following interventions: Spoke with patient verified home address mailed food and utility resources. Letter saved in Epic.  Follow Up Plan: Gave patient my name and number to contact if she does not receive the food pantry and utility assistance resources in the next 7 to 10 business days. Closing referral.   [name], AAS Paralegal, [facility]  [address], [place] [address] ??[email or web address]  ?? [phone]   [email or web address]

## 2022-05-26 ENCOUNTER — Encounter (INDEPENDENT_AMBULATORY_CARE_PROVIDER_SITE_OTHER): Payer: Self-pay | Admitting: Nurse Practitioner

## 2022-05-26 ENCOUNTER — Ambulatory Visit (INDEPENDENT_AMBULATORY_CARE_PROVIDER_SITE_OTHER): Payer: Medicaid Other | Admitting: Nurse Practitioner

## 2022-05-26 VITALS — BP 103/57 | HR 74 | Temp 98.4°F | Ht 60.0 in | Wt 226.0 lb

## 2022-05-26 DIAGNOSIS — E669 Obesity, unspecified: Secondary | ICD-10-CM | POA: Diagnosis not present

## 2022-05-26 DIAGNOSIS — Z6841 Body Mass Index (BMI) 40.0 and over, adult: Secondary | ICD-10-CM | POA: Diagnosis not present

## 2022-05-26 DIAGNOSIS — I10 Essential (primary) hypertension: Secondary | ICD-10-CM | POA: Diagnosis not present

## 2022-05-26 DIAGNOSIS — E559 Vitamin D deficiency, unspecified: Secondary | ICD-10-CM | POA: Diagnosis not present

## 2022-05-26 MED ORDER — VITAMIN D (ERGOCALCIFEROL) 1.25 MG (50000 UNIT) PO CAPS: ORAL_CAPSULE | ORAL | 0 refills | Status: DC
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
## 2022-05-28 ENCOUNTER — Ambulatory Visit: Payer: Medicaid Other

## 2022-05-28 DIAGNOSIS — M6281 Muscle weakness (generalized): Secondary | ICD-10-CM | POA: Diagnosis not present

## 2022-05-28 DIAGNOSIS — M170 Bilateral primary osteoarthritis of knee: Secondary | ICD-10-CM | POA: Diagnosis not present

## 2022-05-28 DIAGNOSIS — R2681 Unsteadiness on feet: Secondary | ICD-10-CM | POA: Diagnosis not present

## 2022-05-28 DIAGNOSIS — M25562 Pain in left knee: Secondary | ICD-10-CM

## 2022-06-02 DIAGNOSIS — M1712 Unilateral primary osteoarthritis, left knee: Secondary | ICD-10-CM | POA: Diagnosis not present

## 2022-06-03 NOTE — Progress Notes (Unsigned)
Chief Complaint:   OBESITY April [name] is here to discuss her progress with her obesity treatment plan along with follow-up of her obesity related diagnoses. April [name] is on the Category 2 Plan and states she is following her eating plan approximately 85% of the time. April [name] states she is walking/stretching 60 minutes 4 times per week.  Today's visit was #: 44 Starting weight: 256 lbs Starting date: 06/13/2019 Today's weight: 226 lbs Today's date: 05/26/2022 Total lbs lost to date: 30 lbs Total lbs lost since last in-office visit: 0  Interim History: April [name] is going to see a therapist every 2 weeks. Wants to start meal preparing. Feels that going to therapy has helped. Calories [phone], protein: 70-126, notes some hunger and cravings. Drinking water with lemon. Her daughter is getting married Sept 30th.  Subjective:   1. Essential hypertension April [name] is currently taking HCTZ 25 mg. Denies any side effects.Denies any chest pain,shortness of breath or palpitations.  2. Vitamin D deficiency April [name] is currently taking prescription Vit D 50,000 IU once every 10 days. Denies any nausea, vomiting or muscle weakness.  Assessment/Plan:   1. Essential hypertension April [name] will continue to follow up with PCP and continue with medications.  April [name] is working on healthy weight loss and exercise to improve blood pressure control. We will watch for signs of hypotension as she continues her lifestyle modifications.   2. Vitamin D deficiency We will refill Vit D 50,000 IU once a week for 1 month with 0 refills.  Low Vitamin D level contributes to fatigue and are associated with obesity, breast, and colon cancer. She agrees to continue to take prescription Vitamin D '@50'$ ,000 IU every week and will follow-up for routine testing of Vitamin D, at least 2-3 times per year to avoid over-replacement.   -Refill Vitamin D, Ergocalciferol, (DRISDOL) 1.25 MG (50000 UNIT) CAPS capsule; One Capsule  by mouth every 10 days  Dispense: 4 capsule; Refill: 0  3. Obesity, Current BMI 44.2 April [name] is currently in the action stage of change. As such, her goal is to continue with weight loss efforts. She has agreed to the Category 2 Plan.   Exercise goals: As is.  We will obtain IC at next visit. Last IC 06/13/19 at 1350.  Behavioral modification strategies: increasing lean protein intake, increasing water intake, and meal planning and cooking strategies.  April [name] has agreed to follow-up with our clinic in 3 weeks. She was informed of the importance of frequent follow-up visits to maximize her success with intensive lifestyle modifications for her multiple health conditions.   Objective:   Blood pressure (!) 103/57, pulse 74, temperature 98.4 F (36.9 C), height 5' (1.524 m), weight 226 lb (102.5 kg), SpO2 100 %. Body mass index is 44.14 kg/m.  General: Cooperative, alert, well developed, in no acute distress. HEENT: Conjunctivae and lids unremarkable. Cardiovascular: Regular rhythm.  Lungs: Normal work of breathing. Neurologic: No focal deficits.   Lab Results  Component Value Date   CREATININE 0.67 03/13/2022   BUN 16 03/13/2022   NA 137 03/13/2022   K 3.7 03/13/2022   CL 102 03/13/2022   CO2 24 03/13/2022   Lab Results  Component Value Date   ALT 13 03/13/2022   AST 21 03/13/2022   ALKPHOS 101 03/13/2022   BILITOT 0.9 03/13/2022   Lab Results  Component Value Date   HGBA1C 5.5 03/13/2022   HGBA1C 5.6 11/06/2021   HGBA1C 5.5 04/29/2021   HGBA1C 7.3 (H) 04/07/2021   HGBA1C 5.3 11/06/2020  Lab Results  Component Value Date   INSULIN 7.3 11/06/2021   INSULIN 3.6 04/07/2021   INSULIN 13.8 11/06/2020   INSULIN 11.6 07/10/2020   INSULIN 7.5 03/20/2020   Lab Results  Component Value Date   TSH 0.949 03/13/2022   Lab Results  Component Value Date   CHOL 198 03/13/2022   HDL 52 03/13/2022   LDLCALC 129 (H) 03/13/2022   LDLDIRECT 153 (H) 08/16/2020   TRIG 87  03/13/2022   CHOLHDL 3.8 03/13/2022   Lab Results  Component Value Date   VD25OH 79.9 11/06/2021   VD25OH 65.2 04/07/2021   VD25OH 61.9 11/06/2020   Lab Results  Component Value Date   WBC 8.1 03/13/2022   HGB 14.0 03/13/2022   HCT 44.0 03/13/2022   MCV 89.8 03/13/2022   PLT 296 03/13/2022   Lab Results  Component Value Date   IRON 36 (L) 12/11/2009   TIBC 318 12/11/2009   FERRITIN 19 12/11/2009   Attestation Statements:   Reviewed by clinician on day of visit: allergies, medications, problem list, medical history, surgical history, family history, social history, and previous encounter notes.  I, Brendell Tyus, RMA, am acting as transcriptionist for Everardo Pacific, FNP.  I have reviewed the above documentation for accuracy and completeness, and I agree with the above. Everardo Pacific, FNP

## 2022-06-04 ENCOUNTER — Ambulatory Visit: Payer: Medicaid Other | Admitting: Physical Therapy

## 2022-06-04 ENCOUNTER — Encounter: Payer: Self-pay | Admitting: Physical Therapy

## 2022-06-04 DIAGNOSIS — M25562 Pain in left knee: Secondary | ICD-10-CM | POA: Diagnosis not present

## 2022-06-04 DIAGNOSIS — M6281 Muscle weakness (generalized): Secondary | ICD-10-CM

## 2022-06-04 DIAGNOSIS — R2681 Unsteadiness on feet: Secondary | ICD-10-CM | POA: Diagnosis not present

## 2022-06-04 DIAGNOSIS — M170 Bilateral primary osteoarthritis of knee: Secondary | ICD-10-CM | POA: Diagnosis not present
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
## 2022-06-06 ENCOUNTER — Ambulatory Visit: Payer: Medicaid Other | Admitting: Physical Therapy

## 2022-06-06 ENCOUNTER — Encounter: Payer: Self-pay | Admitting: Physical Therapy

## 2022-06-06 DIAGNOSIS — M6281 Muscle weakness (generalized): Secondary | ICD-10-CM | POA: Diagnosis not present

## 2022-06-06 DIAGNOSIS — M170 Bilateral primary osteoarthritis of knee: Secondary | ICD-10-CM | POA: Diagnosis not present

## 2022-06-06 DIAGNOSIS — R2681 Unsteadiness on feet: Secondary | ICD-10-CM

## 2022-06-06 DIAGNOSIS — M25562 Pain in left knee: Secondary | ICD-10-CM | POA: Diagnosis not present
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
## 2022-06-09 ENCOUNTER — Encounter: Payer: Medicaid Other | Admitting: Physical Therapy

## 2022-06-11 ENCOUNTER — Encounter: Payer: Self-pay | Admitting: Physical Therapy

## 2022-06-11 ENCOUNTER — Ambulatory Visit: Payer: Medicaid Other | Admitting: Physical Therapy

## 2022-06-11 DIAGNOSIS — M6281 Muscle weakness (generalized): Secondary | ICD-10-CM | POA: Diagnosis not present

## 2022-06-11 DIAGNOSIS — M170 Bilateral primary osteoarthritis of knee: Secondary | ICD-10-CM | POA: Diagnosis not present

## 2022-06-11 DIAGNOSIS — R2681 Unsteadiness on feet: Secondary | ICD-10-CM

## 2022-06-11 DIAGNOSIS — M25562 Pain in left knee: Secondary | ICD-10-CM | POA: Diagnosis not present
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
## 2022-06-16 ENCOUNTER — Ambulatory Visit: Payer: Medicaid Other | Admitting: Family Medicine

## 2022-06-16 ENCOUNTER — Encounter: Payer: Self-pay | Admitting: Family Medicine

## 2022-06-16 DIAGNOSIS — I10 Essential (primary) hypertension: Secondary | ICD-10-CM

## 2022-06-16 DIAGNOSIS — M170 Bilateral primary osteoarthritis of knee: Secondary | ICD-10-CM

## 2022-06-16 NOTE — Assessment & Plan Note (Signed)
-  given patient's quality of life and improved health, I think she would greatly benefit from tramadol. I explained to her that I am willing to prescribe a lower dose as the lowest dose she can tolerate with benefits is best for her. While orthopedics is prescribing, I will allow them to do so as they deem appropriate and not prescribe. But if needed, I can prescribe a low dose of either tramdol 25 mg daily or 50 mg every other day if her orthopedics office is not able to provide her with refills for some reason. She agreed and prefers the tramadol 25 mg so that she will have something to take daily.  -continue to see ortho as appropriate -continue PT -follow up as appropriate

## 2022-06-16 NOTE — Progress Notes (Signed)
    SUBJECTIVE:   CHIEF COMPLAINT / HPI:   Patient presents for follow up of chronic bilaterally knee pain with left knee more painful than the right. She had been on tramadol in the past but we have done a trial of her off of it since one of her other providers discontinued it. Since our last visit, she has gone to orthopedics for routine steroid injections and the pain was worse that they prescribed her tramadol. She also started PT which has been going well but the increased activities has been causing her more pain at times. Follows with healthy weight and wellness, has been doing well with this program. She is very active, walks a lot and does her exercises at home but has not been able to exercise as much due to to he pain. Since the pain has made her less active, she has gained weight which she is unhappy about. Also goes to the [facility]. She is requesting the tramadol as it has really helped her and she has noticed some regression in her progress since the discontinuation of tramadol.   PERTINENT  PMH / PSH:   History of hypertension. Compliant on HCTZ. Denies chest pain, dyspnea, leg swelling, dizziness or weakness. Had a visit at the beginning of the year for dehydration but has not felt weak since then.   OBJECTIVE:   BP 108/64   Pulse 68   Wt 227 lb (103 kg)   SpO2 99%   BMI 44.33 kg/m   General: Patient well-appearing, in no acute distress. MSK:no gross deformity upon inspection, no erythema or edema noted, no point tenderness upon palpation noted, full active and passive ROM along knee joints bilaterally Neuro: 5/5 LE strength bilaterally, gross sensation intact, normal gait  Ext: distal pulses strong and equal bilaterally    ASSESSMENT/PLAN:   Essential hypertension -BP 108/64, well-controlled -continue HCTZ therapy -instructed to stay hydrated and call clinic if she has any weakness or dizziness, but at this time no concern for her being over treated as the regimen is  keeping her BP at goal   Osteoarthritis of knees, bilateral -given patient's quality of life and improved health, I think she would greatly benefit from tramadol. I explained to her that I am willing to prescribe a lower dose as the lowest dose she can tolerate with benefits is best for her. While orthopedics is prescribing, I will allow them to do so as they deem appropriate and not prescribe. But if needed, I can prescribe a low dose of either tramdol 25 mg daily or 50 mg every other day if her orthopedics office is not able to provide her with refills for some reason. She agreed and prefers the tramadol 25 mg so that she will have something to take daily.  -continue to see ortho as appropriate -continue PT -follow up as appropriate    Conroy Goracke Larae Grooms, [place]

## 2022-06-16 NOTE — Patient Instructions (Signed)
It was great seeing you today!  Today we discussed your knee and your blood pressure. I will start to prescribe tramadol again at a lower dose if needed but while orthopedics is doing it I will hold off and let them continue for now.   Your blood pressure is normal, 108/64. Please continue to take your medication. If you start to feel weak, dizzy or about to pass out then please call the clinic.   Please follow up at your next scheduled appointment in 6 months, if anything arises between now and then, please don't hesitate to contact our office.   Thank you for allowing [place] to be a part of your medical care!  Thank you, Dr. Larae Grooms

## 2022-06-16 NOTE — Assessment & Plan Note (Signed)
-  BP 108/64, well-controlled -continue HCTZ therapy -instructed to stay hydrated and call clinic if she has any weakness or dizziness, but at this time no concern for her being over treated as the regimen is keeping her BP at goal

## 2022-06-17 ENCOUNTER — Encounter (INDEPENDENT_AMBULATORY_CARE_PROVIDER_SITE_OTHER): Payer: Self-pay | Admitting: Nurse Practitioner

## 2022-06-17 ENCOUNTER — Ambulatory Visit (INDEPENDENT_AMBULATORY_CARE_PROVIDER_SITE_OTHER): Payer: Medicaid Other | Admitting: Nurse Practitioner

## 2022-06-17 VITALS — BP 104/71 | HR 70 | Temp 97.7°F | Ht 60.0 in | Wt 225.0 lb

## 2022-06-17 DIAGNOSIS — E559 Vitamin D deficiency, unspecified: Secondary | ICD-10-CM

## 2022-06-17 DIAGNOSIS — R0609 Other forms of dyspnea: Secondary | ICD-10-CM | POA: Diagnosis not present

## 2022-06-17 DIAGNOSIS — E669 Obesity, unspecified: Secondary | ICD-10-CM | POA: Diagnosis not present

## 2022-06-17 DIAGNOSIS — Z6841 Body Mass Index (BMI) 40.0 and over, adult: Secondary | ICD-10-CM

## 2022-06-17 MED ORDER — VITAMIN D (ERGOCALCIFEROL) 1.25 MG (50000 UNIT) PO CAPS: ORAL_CAPSULE | ORAL | 0 refills | Status: DC

## 2022-06-20 ENCOUNTER — Ambulatory Visit: Payer: Medicaid Other | Attending: Family Medicine | Admitting: Physical Therapy

## 2022-06-20 ENCOUNTER — Encounter: Payer: Self-pay | Admitting: Physical Therapy

## 2022-06-20 DIAGNOSIS — R2681 Unsteadiness on feet: Secondary | ICD-10-CM | POA: Insufficient documentation

## 2022-06-20 DIAGNOSIS — M25562 Pain in left knee: Secondary | ICD-10-CM | POA: Diagnosis present

## 2022-06-20 DIAGNOSIS — M6281 Muscle weakness (generalized): Secondary | ICD-10-CM | POA: Insufficient documentation
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
## 2022-06-22 NOTE — Progress Notes (Signed)
Chief Complaint:   OBESITY Ashlea is here to discuss her progress with her obesity treatment plan along with follow-up of her obesity related diagnoses. Dashayla is on the Category 2 Plan and states she is following her eating plan approximately 85% of the time. Blondine states she is walking and going to the [employer] 40 minutes 3-4 times per week.  Today's visit was #: 28 Starting weight: 256 lbs Starting date: 06/13/2019 Today's weight: 225 lbs Today's date: 06/17/2022 Total lbs lost to date: 31 lbs Total lbs lost since last in-office visit: 1  Interim History: Geraldean's highest weight 260 lbs. She has done well with weight loss since her last visit. She started going back to the [facility] last week after getting a scholarship. Calories: [phone]. Protein: 60-120s grams. Some days just does not feel like cooking.   Subjective:   1. Vitamin D deficiency Raimi is currently taking prescription Vit D 50,000 IU every 10 days. .   2. Dyspnea on exertion Nyomie's last IC on 9/20 was at 1350. Today's IC at 1555.  Assessment/Plan:   1. Vitamin D deficiency We will refill Vit D 50,000 IU once a week for 1 month with 0 refills. Low Vitamin D level contributes to fatigue and are associated with obesity, breast, and colon cancer. She agrees to continue to take prescription Vitamin D '@50'$ ,000 IU every week and will follow-up for routine testing of Vitamin D, at least 2-3 times per year to avoid over-replacement.   -Refill Vitamin D, Ergocalciferol, (DRISDOL) 1.25 MG (50000 UNIT) CAPS capsule; One Capsule by mouth every 10 days  Dispense: 4 capsule; Refill: 0  2. Dyspnea on exertion IC improved.  Reviewed with patient today.   3. Obesity, Current BMI 44.2 Deatra is currently in the action stage of change. As such, her goal is to continue with weight loss efforts. She has agreed to keeping a food journal and adhering to recommended goals of 1100-1200 calories and 75+ grams of protein.    Exercise goals: As is.  Will obtain labs in 1-3 months.  Behavioral modification strategies: increasing lean protein intake, increasing vegetables, and increasing water intake.  Sage has agreed to follow-up with our clinic in 3 weeks. She was informed of the importance of frequent follow-up visits to maximize her success with intensive lifestyle modifications for her multiple health conditions.   Objective:   Blood pressure 104/71, pulse 70, temperature 97.7 F (36.5 C), height 5' (1.524 m), weight 225 lb (102.1 kg), SpO2 100 %. Body mass index is 43.94 kg/m.  General: Cooperative, alert, well developed, in no acute distress. HEENT: Conjunctivae and lids unremarkable. Cardiovascular: Regular rhythm.  Lungs: Normal work of breathing. Neurologic: No focal deficits.   Lab Results  Component Value Date   CREATININE 0.67 03/13/2022   BUN 16 03/13/2022   NA 137 03/13/2022   K 3.7 03/13/2022   CL 102 03/13/2022   CO2 24 03/13/2022   Lab Results  Component Value Date   ALT 13 03/13/2022   AST 21 03/13/2022   ALKPHOS 101 03/13/2022   BILITOT 0.9 03/13/2022   Lab Results  Component Value Date   HGBA1C 5.5 03/13/2022   HGBA1C 5.6 11/06/2021   HGBA1C 5.5 04/29/2021   HGBA1C 7.3 (H) 04/07/2021   HGBA1C 5.3 11/06/2020   Lab Results  Component Value Date   INSULIN 7.3 11/06/2021   INSULIN 3.6 04/07/2021   INSULIN 13.8 11/06/2020   INSULIN 11.6 07/10/2020   INSULIN 7.5 03/20/2020  Lab Results  Component Value Date   TSH 0.949 03/13/2022   Lab Results  Component Value Date   CHOL 198 03/13/2022   HDL 52 03/13/2022   LDLCALC 129 (H) 03/13/2022   LDLDIRECT 153 (H) 08/16/2020   TRIG 87 03/13/2022   CHOLHDL 3.8 03/13/2022   Lab Results  Component Value Date   VD25OH 79.9 11/06/2021   VD25OH 65.2 04/07/2021   VD25OH 61.9 11/06/2020   Lab Results  Component Value Date   WBC 8.1 03/13/2022   HGB 14.0 03/13/2022   HCT 44.0 03/13/2022   MCV 89.8 03/13/2022    PLT 296 03/13/2022   Lab Results  Component Value Date   IRON 36 (L) 12/11/2009   TIBC 318 12/11/2009   FERRITIN 19 12/11/2009   Attestation Statements:   Reviewed by clinician on day of visit: allergies, medications, problem list, medical history, surgical history, family history, social history, and previous encounter notes.  Time spent on visit including pre-visit chart review and post-visit care and charting was 30 minutes.   I, Brendell Tyus, RMA, am acting as transcriptionist for Everardo Pacific, FNP.  I have reviewed the above documentation for accuracy and completeness, and I agree with the above. Everardo Pacific, FNP

## 2022-06-25 ENCOUNTER — Encounter: Payer: Self-pay | Admitting: Physical Therapy

## 2022-06-25 ENCOUNTER — Ambulatory Visit: Payer: Medicaid Other | Admitting: Physical Therapy

## 2022-06-25 DIAGNOSIS — M25562 Pain in left knee: Secondary | ICD-10-CM

## 2022-06-25 DIAGNOSIS — M6281 Muscle weakness (generalized): Secondary | ICD-10-CM

## 2022-06-25 DIAGNOSIS — R2681 Unsteadiness on feet: Secondary | ICD-10-CM
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
## 2022-06-29 ENCOUNTER — Ambulatory Visit: Payer: Medicaid Other | Admitting: Family Medicine

## 2022-06-29 ENCOUNTER — Other Ambulatory Visit: Payer: Self-pay

## 2022-06-29 DIAGNOSIS — L989 Disorder of the skin and subcutaneous tissue, unspecified: Secondary | ICD-10-CM | POA: Diagnosis not present

## 2022-06-29 DIAGNOSIS — D2270 Melanocytic nevi of unspecified lower limb, including hip: Secondary | ICD-10-CM | POA: Insufficient documentation

## 2022-06-29 NOTE — Progress Notes (Signed)
    SUBJECTIVE:   CHIEF COMPLAINT / HPI:   Patient presents with a dark spot on the bottom of her right foot. First noticed about a week ago. States it stings when she walks. Noticed the stinging first then the bump. Denied having this prior. Denies it getting better or worse. Denies any trauma. Denies loss of sensation to feet. Denies any personal or family history of skin cancer   PERTINENT  PMH / PSH: Reviewed   OBJECTIVE:   BP 134/76   Pulse 70   Wt 231 lb 9.6 oz (105.1 kg)   SpO2 100%   BMI 45.23 kg/m    Physical exam General: well appearing, NAD Cardiovascular: RRR, no murmurs Lungs: CTAB. Normal WOB Abdomen: soft, non-distended, non-tender Skin: warm, dry. 1cm non tender hyperpigmented lesion on plantar surface of R foot (image below). Normal sensation      ASSESSMENT/PLAN:   Skin lesion of foot Patient presents with small 1cm hyperpigmented lesion on the plantar aspect of her right foot for the past week without any known injury or trauma. Likely ecchymosis but recommended close monitoring if it were to worsen then we could check for potential melanoma though less likely and no family history of skin cancer. Return precautions discussed.     [place]

## 2022-06-29 NOTE — Assessment & Plan Note (Signed)
Patient presents with small 1cm hyperpigmented lesion on the plantar aspect of her right foot for the past week without any known injury or trauma. Likely ecchymosis but recommended close monitoring if it were to worsen then we could check for potential melanoma though less likely and no family history of skin cancer. Return precautions discussed.

## 2022-07-02 ENCOUNTER — Other Ambulatory Visit: Payer: Self-pay | Admitting: Obstetrics and Gynecology

## 2022-07-02 NOTE — Patient Outreach (Signed)
Medicaid Managed Care   Nurse Care Manager Note  07/02/2022 Name:  April [name] MRN:  [id number] DOB:  1961/05/21  April [name] is an 61 y.o. year old female who is a primary patient of Fox Chase, Maud, DO.  The [facility] Managed Care Coordination team was consulted for assistance with:    Chronic healthcare management needs, HTN, GERD, osteoarthritis, LBP, anxiety/depression  Ms. April [name] was given information about Medicaid Managed Care Coordination team services today. April [name] Patient agreed to services and verbal consent obtained.  Engaged with patient by telephone for follow up visit in response to provider referral for case management and/or care coordination services.   Assessments/Interventions:  Review of past medical history, allergies, medications, health status, including review of consultants reports, laboratory and other test data, was performed as part of comprehensive evaluation and provision of chronic care management services.  SDOH (Social Determinants of Health) assessments and interventions performed: SDOH Interventions    Flowsheet Row Patient Outreach Telephone from 07/02/2022 in [place] Telephone from 05/25/2022 in [place] Patient Outreach Telephone from 05/22/2022 in [place] Interventions     Food Insecurity Interventions -- Other (Comment)  [Mailed food pantry list] Other (Comment)  [Care Guide referral]  Transportation Interventions -- -- Intervention Not Indicated  Utilities Interventions Ambulatory [id number] Order  [resources received in August] -- --  Alcohol Usage Interventions Intervention Not Indicated (Score <7) -- --  Financial Strain Interventions -- Other (Comment)  [mailed resources for utility assistance.] --     Care Plan  Allergies  Allergen Reactions   Percocet [Oxycodone-Acetaminophen]  Swelling   Naproxen Palpitations    Medications Reviewed Today     Reviewed by [name], RN (Registered Nurse) on 07/02/22 at 1430  Med List Status: <None>   Medication Order Taking? Sig Documenting Provider Last Dose Status Informant  benztropine (COGENTIN) 1 MG tablet [id number] No Take 1 tablet (1 mg total) by mouth daily. Freida Busman, MD Taking Active   diclofenac Sodium (VOLTAREN) 1 % GEL [id number] No APPLY 2 GRAMS TOPICALLY TO THE AFFECTED AREA FOUR TIMES DAILY  Patient taking differently: 2 g 4 (four) times daily as needed (Apply to affected area for pain).   Donney Dice, DO Taking Active Self  haloperidol (HALDOL) 1 MG tablet [id number] No Take 1 tablet (1 mg total) by mouth 2 (two) times daily. Freida Busman, MD Taking Active   hydrochlorothiazide (HYDRODIURIL) 25 MG tablet [id number] No Take 1 tablet (25 mg total) by mouth daily. Dennard Nip D, MD Taking Active   Vitamin D, Ergocalciferol, (DRISDOL) 1.25 MG (50000 UNIT) CAPS capsule [id number]  One Capsule by mouth every 10 days Everardo Pacific, FNP  Active            Patient Active Problem List   Diagnosis Date Noted   Skin lesion of foot 06/29/2022   Delusional disorder ([place]) 03/14/2022   Pap smear for cervical cancer screening 09/16/2021   Tension headache 01/01/2021   Essential hypertension 08/15/2020   Insulin resistance 07/11/2019   Class 3 severe obesity with serious comorbidity and body mass index (BMI) of 50.0 to 59.9 in adult [facility]) 07/11/2019   Vitamin D deficiency 03/15/2019   Vitiligo 03/03/2018   Insomnia 11/05/2017   Fatigue 03/02/2016   Osteoarthritis of knees, bilateral 09/23/2015   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   Low back pain 01/27/2012   GERD 03/22/2009  Anxiety state 11/14/2008   Eczema 05/18/2008   Depression 03/10/2007   HYPERCHOLESTEROLEMIA 12/09/2006   Anemia 12/09/2006   Conditions to be addressed/monitored per PCP order:  Chronic healthcare management needs,  HTN, GERD, osteoarthritis, LBP, anxiety/depression  Care Plan : RN Care Manager Plan of Care  Updates made by [name], RN since 07/02/2022 12:00 AM     Problem: Health Promotion or Disease Self-Management (General Plan of Care)      Long-Range Goal: Chronic Disease Management and Care Coordination Needs/SDOH   Start Date: 05/22/2022  Expected End Date: 08/22/2022  Priority: High  Note:   Current Barriers:  Care Coordination needs related to Limited access to food resources and utility resources-completed. Chronic Disease Management HTN, GERD, osteoarthritis, low back pain, obesity, anxiety/depression, delusional disorder. 07/02/22:  Patient has received food and utility resources.  Patient continues to attend [facility] monthly and therapy every 2 weeks.  Continues to lose weight improving knee pain.  RNCM Clinical Goal(s):  Patient will take all medications exactly as prescribed and will call provider for medication related questions as evidenced by patient report demonstrate understanding of rationale for each prescribed medication as evidenced by patient report attend all scheduled medical appointments as evidenced by patient report continue to work with RN Care Manager to address care management and care coordination needs related to SDOH. HTN, GERD, osteoarthritis, LBP, obesity, anxiety/depression as evidenced by adherence to CM Team Scheduled appointments work with [employer] care guide to address needs related to  Limited access to food and utility resources as evidenced by patient and/or community resource care guide support through collaboration with Consulting civil engineer, provider, and care team.   Interventions: Inter-disciplinary care team collaboration (see longitudinal plan of care) Evaluation of current treatment plan related to  self management and patient's adherence to plan as established by provider Care Guide referral for utility resources and  food resources-completed. Collaborated with Care Guide for food and utility resources  SDOH Barriers (Status:  New goal.) Long Term Goal Patient interviewed and SDOH assessment performed        SDOH Interventions    Flowsheet Row Most Recent Value  SDOH Interventions   Food Insecurity Interventions Other (Comment)  [Care Guide referral]  Transportation Interventions Intervention Not Indicated     Patient interviewed and appropriate assessments performed Referred patient to community resources care guide team for assistance with food and utility resources. Discussed plans with patient for ongoing care management follow up and provided patient with direct contact information for care management team  Patient Goals/Self-Care Activities: Take all medications as prescribed Attend all scheduled provider appointments Call pharmacy for medication refills 3-7 days in advance of running out of medications Perform all self care activities independently  Perform IADL's (shopping, preparing meals, housekeeping, managing finances) independently Call provider office for new concerns or questions   Follow Up Plan:  The patient has been provided with contact information for the care management team and has been advised to call with any health related questions or concerns.  The care management team will reach out to the patient again over the next 30 business  days.   Long-Range Goal: Establish Plan of Care for Chronic Disease Management Needs/SDOH   Priority: High  Note:   Timeframe:  Long-Range Goal Priority:  High Start Date:         05/22/22                    Expected End  Date:  ongoing                   Follow Up Date 08/05/22   - practice safe sex - schedule appointment for flu shot - schedule appointment for vaccines needed due to my age or health - schedule recommended health tests (blood work, mammogram, colonoscopy, pap test) - schedule and keep appointment for annual check-up     Why is this important?   Screening tests can find diseases early when they are easier to treat.  Your doctor or nurse will talk with you about which tests are important for you.  Getting shots for common diseases like the flu and shingles will help prevent them.  07/02/22:  Patient continues to follow with [facility] monthly.    Follow Up:  Patient agrees to Care Plan and Follow-up.  Plan: The Managed Medicaid care management team will reach out to the patient again over the next 30 business  days. and The  Patient has been provided with contact information for the Managed Medicaid care management team and has been advised to call with any health related questions or concerns.  Date/time of next scheduled RN care management/care coordination outreach:  08/05/22 at 1115.

## 2022-07-02 NOTE — Patient Instructions (Signed)
Hi Ms. Harlan, nice to speak with you today, have a nice afternoon.  Ms. Prezioso was given information about Medicaid Managed Care team care coordination services as a part of their Healthy [facility] Medicaid benefit. [name] verbally consented to engagement with the [facility] Managed Care team.   If you are experiencing a medical emergency, please call 911 or report to your local emergency department or urgent care.   If you have a non-emergency medical problem during routine business hours, please contact your provider's office and ask to speak with a nurse.   For questions related to your Healthy [facility] health plan, please call: [phone] or visit the homepage here: [email or web address]  If you would like to schedule transportation through your Healthy [facility] plan, please call the following number at least 2 days in advance of your appointment: [phone]  For information about your ride after you set it up, call Ride Assist at [phone]. Use this number to activate a Will Call pickup, or if your transportation is late for a scheduled pickup. Use this number, too, if you need to make a change or cancel a previously scheduled reservation.  If you need transportation services right away, call [phone]. The after-hours call center is staffed 24 hours to handle ride assistance and urgent reservation requests (including discharges) 365 days a year. Urgent trips include sick visits, hospital discharge requests and life-sustaining treatment.  Call the [place] at [phone], at any time, 24 hours a day, 7 days a week. If you are in danger or need immediate medical attention call 911.  If you would like help to quit smoking, call 1-800-QUIT-NOW 562-655-8952) OR Espaol: 1-855-Djelo-Ya (7-619-509-3267) o para ms informacin haga clic aqu or Text READY to 200-400 to register via text  April [name] - following  are the goals we discussed in your visit today:   Goals Addressed    Timeframe:  Long-Range Goal Priority:  High Start Date:         05/22/22                    Expected End Date:  ongoing                   Follow Up Date 08/05/22   - practice safe sex - schedule appointment for flu shot - schedule appointment for vaccines needed due to my age or health - schedule recommended health tests (blood work, mammogram, colonoscopy, pap test) - schedule and keep appointment for annual check-up    Why is this important?   Screening tests can find diseases early when they are easier to treat.  Your doctor or nurse will talk with you about which tests are important for you.  Getting shots for common diseases like the flu and shingles will help prevent them.  07/02/22:  Patient continues to follow with [facility].   Patient verbalizes understanding of instructions and care plan provided today and agrees to view in [place]. Active MyChart status and patient understanding of how to access instructions and care plan via MyChart confirmed with patient.     The Managed Medicaid care management team will reach out to the patient again over the next 30 business  days.  The  Patient  has been provided with contact information for the Managed Medicaid care management team and has been advised to call with any health related questions or concerns.   [name] RN, [place]  [facility] Curator - Managed Medicaid High Risk [phone]   Following is a copy of your plan of care:  Care Plan : [place] of Care  Updates made by [name], RN since 07/02/2022 12:00 AM     Problem: Health Promotion or Disease Self-Management (General Plan of Care)      Long-Range Goal: Chronic Disease Management and Care Coordination Needs/SDOH   Start Date: 05/22/2022  Expected End Date: 08/22/2022  Priority: High  Note:   Current  Barriers:  Care Coordination needs related to Limited access to food resources and utility resources-completed. Chronic Disease Management HTN, GERD, osteoarthritis, low back pain, obesity, anxiety/depression, delusional disorder. 07/02/22:  Patient has received food and utility resources.  Patient continues to attend [facility] monthly and therapy every 2 weeks.  Continues to lose weight improving knee pain.  RNCM Clinical Goal(s):  Patient will take all medications exactly as prescribed and will call provider for medication related questions as evidenced by patient report demonstrate understanding of rationale for each prescribed medication as evidenced by patient report attend all scheduled medical appointments as evidenced by patient report continue to work with RN Care Manager to address care management and care coordination needs related to SDOH. HTN, GERD, osteoarthritis, LBP, obesity, anxiety/depression as evidenced by adherence to CM Team Scheduled appointments work with [employer] care guide to address needs related to  Limited access to food and utility resources as evidenced by patient and/or community resource care guide support through collaboration with Consulting civil engineer, provider, and care team.   Interventions: Inter-disciplinary care team collaboration (see longitudinal plan of care) Evaluation of current treatment plan related to  self management and patient's adherence to plan as established by provider Care Guide referral for utility resources and food resources-completed. Collaborated with Care Guide for food and utility resources  SDOH Barriers (Status:  New goal.) Long Term Goal Patient interviewed and SDOH assessment performed        SDOH Interventions    Flowsheet Row Most Recent Value  SDOH Interventions   Food Insecurity Interventions Other (Comment)  [Care Guide referral]  Transportation Interventions Intervention Not Indicated      Patient interviewed and appropriate assessments performed Referred patient to community resources care guide team for assistance with food and utility resources. Discussed plans with patient for ongoing care management follow up and provided patient with direct contact information for care management team  Patient Goals/Self-Care Activities: Take all medications as prescribed Attend all scheduled provider appointments Call pharmacy for medication refills 3-7 days in advance of running out of medications Perform all self care activities independently  Perform IADL's (shopping, preparing meals, housekeeping, managing finances) independently Call provider office for new concerns or questions   Follow Up Plan:  The patient has been provided with contact information for the care management team and has been advised to call with any health related questions or concerns.  The care management team will reach out to the patient again over the next 30 business  days

## 2022-07-06 ENCOUNTER — Telehealth: Payer: Self-pay | Admitting: Family Medicine

## 2022-07-06 NOTE — Telephone Encounter (Signed)
Patient came in stating that she needs a refill of Tramadol please

## 2022-07-06 NOTE — Telephone Encounter (Signed)
Pt stated that the orthopedic office stopped prescribing it to her and that she was told by you if they stop prescribing it then you will start prescribing it.

## 2022-07-08 NOTE — Telephone Encounter (Signed)
Ok. Thank you.

## 2022-07-10 ENCOUNTER — Other Ambulatory Visit: Payer: Self-pay | Admitting: Family Medicine

## 2022-07-10 DIAGNOSIS — Z1231 Encounter for screening mammogram for malignant neoplasm of breast: Secondary | ICD-10-CM

## 2022-07-14 ENCOUNTER — Ambulatory Visit (INDEPENDENT_AMBULATORY_CARE_PROVIDER_SITE_OTHER): Payer: Medicaid Other | Admitting: Family Medicine

## 2022-07-14 ENCOUNTER — Encounter (INDEPENDENT_AMBULATORY_CARE_PROVIDER_SITE_OTHER): Payer: Self-pay | Admitting: Family Medicine

## 2022-07-14 VITALS — BP 123/78 | HR 64 | Temp 97.7°F | Ht 60.0 in | Wt 231.0 lb

## 2022-07-14 DIAGNOSIS — Z6841 Body Mass Index (BMI) 40.0 and over, adult: Secondary | ICD-10-CM | POA: Diagnosis not present

## 2022-07-14 DIAGNOSIS — E559 Vitamin D deficiency, unspecified: Secondary | ICD-10-CM | POA: Diagnosis not present

## 2022-07-14 DIAGNOSIS — I10 Essential (primary) hypertension: Secondary | ICD-10-CM | POA: Diagnosis not present

## 2022-07-14 DIAGNOSIS — E669 Obesity, unspecified: Secondary | ICD-10-CM | POA: Diagnosis not present

## 2022-07-14 MED ORDER — HYDROCHLOROTHIAZIDE 25 MG PO TABS: 25.0000 mg | ORAL_TABLET | Freq: Every day | ORAL | 0 refills | Status: DC

## 2022-07-14 MED ORDER — VITAMIN D (ERGOCALCIFEROL) 1.25 MG (50000 UNIT) PO CAPS: ORAL_CAPSULE | ORAL | 0 refills | Status: DC

## 2022-07-14 MED ORDER — TOPIRAMATE 25 MG PO TABS: 25.0000 mg | ORAL_TABLET | Freq: Two times a day (BID) | ORAL | 0 refills | Status: DC

## 2022-07-14 NOTE — Telephone Encounter (Signed)
Patient calls nurse line in regards to Tramadol refill.   Patient reports last fill from Ortho was 9/25 for #10.   Patient advised next fill date will be October 7th according to last PCP message.   Patient agreed with plan.

## 2022-07-20 LAB — VITAMIN D, 25-HYDROXY, TOTAL: Vitamin D, 25-Hydroxy, Serum: 67 ng/mL

## 2022-07-20 LAB — COMPREHENSIVE METABOLIC PANEL
ALT: 14 IU/L (ref 0–32)
AST: 18 IU/L (ref 0–40)
Albumin/Globulin Ratio: 1.6 (ref 1.2–2.2)
Albumin: 4.7 g/dL (ref 3.8–4.9)
Alkaline Phosphatase: 67 IU/L (ref 44–121)
BUN/Creatinine Ratio: 14 (ref 9–23)
BUN: 12 mg/dL (ref 6–24)
Bilirubin Total: 0.3 mg/dL (ref 0.0–1.2)
CO2: 24 mmol/L (ref 20–29)
Calcium: 9.9 mg/dL (ref 8.7–10.2)
Chloride: 102 mmol/L (ref 96–106)
Creatinine, Ser: 0.83 mg/dL (ref 0.57–1.00)
Globulin, Total: 3 g/dL (ref 1.5–4.5)
Glucose: 86 mg/dL (ref 70–99)
Potassium: 4.9 mmol/L (ref 3.5–5.2)
Sodium: 141 mmol/L (ref 134–144)
Total Protein: 7.7 g/dL (ref 6.0–8.5)
eGFR: 81 mL/min/{1.73_m2} (ref >59)

## 2022-07-22 NOTE — Progress Notes (Signed)
Chief Complaint:   OBESITY April [name] is here to discuss her progress with her obesity treatment plan along with follow-up of her obesity related diagnoses. [name] is on keeping a food journal and adhering to recommended goals of 1100-1200 calories and 75 protein and states she is following her eating plan approximately  8 0% of the time. [name] states she is walking 40-45 minutes 2-3 times per week.  Today's visit was #: 70 Starting weight: 256 lbs Starting date: 06/13/2019 Today's weight: 231 lbs Today's date: 07/14/2022 Total lbs lost to date: 25 lbs Total lbs lost since last in-office visit: +6 lbs  Interim History: Her daughter got married in between visits.  Had cheesecake.  Good about logging on my fitness pal.  Not getting in enough meat.  Tends to overdo starchy foods.  Has some sugar cravings.  Lacks support.  Eats fruits and veggies.  Rarely eats out.   Subjective:   1. Essential hypertension Blood pressure is well controlled on HCTZ 25 mg daily.  Denies chest pain.    2. Vitamin D deficiency She is on prescription Vitamin D 50,000 IU weekly.     Assessment/Plan:   1. Essential hypertension Check labs- Comprehensive metabolic panel  Refill - hydrochlorothiazide (HYDRODIURIL) 25 MG tablet; Take 1 tablet (25 mg total) by mouth daily.  Dispense: 90 tablet; Refill: 0  2. Vitamin D deficiency Refill - Vitamin D, Ergocalciferol, (DRISDOL) 1.25 MG (50000 UNIT) CAPS capsule; One Capsule by mouth every 10 days  Dispense: 4 capsule; Refill: 0  Check labs- Vitamin D, 25-Hydroxy, Total  3. Obesity, current BMI 45.1 Refill - topiramate (TOPAMAX) 25 MG tablet; Take 1 tablet (25 mg total) by mouth 2 (two) times daily.  Dispense: 60 tablet; Refill: 0  Allante is currently in the action stage of change. As such, her goal is to continue with weight loss efforts. She has agreed to keeping a food journal and adhering to recommended goals of 1200 calories and 75+ protein  daily.   Exercise goals:  increase walking to 30 minutes 4 times per week.  Behavioral modification strategies: increasing lean protein intake, decreasing simple carbohydrates, increasing vegetables, decreasing eating out, no skipping meals, meal planning and cooking strategies, better snacking choices, keeping a strict food journal, and decreasing junk food.  [name] has agreed to follow-up with our clinic in 3-4 weeks. She was informed of the importance of frequent follow-up visits to maximize her success with intensive lifestyle modifications for her multiple health conditions.   [name] was informed we would discuss her lab results at her next visit unless there is a critical issue that needs to be addressed sooner. [name] agreed to keep her next visit at the agreed upon time to discuss these results.  Objective:   Blood pressure 123/78, pulse 64, temperature 97.7 F (36.5 C), height 5' (1.524 m), weight 231 lb (104.8 kg), SpO2 99 %. Body mass index is 45.11 kg/m.  General: Cooperative, alert, well developed, in no acute distress. HEENT: Conjunctivae and lids unremarkable. Cardiovascular: Regular rhythm.  Lungs: Normal work of breathing. Neurologic: No focal deficits.   Lab Results  Component Value Date   CREATININE 0.83 07/14/2022   BUN 12 07/14/2022   NA 141 07/14/2022   K 4.9 07/14/2022   CL 102 07/14/2022   CO2 24 07/14/2022   Lab Results  Component Value Date   ALT 14 07/14/2022   AST 18 07/14/2022   ALKPHOS 67 07/14/2022   BILITOT 0.3 07/14/2022  Lab Results  Component Value Date   HGBA1C 5.5 03/13/2022   HGBA1C 5.6 11/06/2021   HGBA1C 5.5 04/29/2021   HGBA1C 7.3 (H) 04/07/2021   HGBA1C 5.3 11/06/2020   Lab Results  Component Value Date   INSULIN 7.3 11/06/2021   INSULIN 3.6 04/07/2021   INSULIN 13.8 11/06/2020   INSULIN 11.6 07/10/2020   INSULIN 7.5 03/20/2020   Lab Results  Component Value Date   TSH 0.949 03/13/2022   Lab Results   Component Value Date   CHOL 198 03/13/2022   HDL 52 03/13/2022   LDLCALC 129 (H) 03/13/2022   LDLDIRECT 153 (H) 08/16/2020   TRIG 87 03/13/2022   CHOLHDL 3.8 03/13/2022   Lab Results  Component Value Date   VD25OH 79.9 11/06/2021   VD25OH 65.2 04/07/2021   VD25OH 61.9 11/06/2020   Lab Results  Component Value Date   WBC 8.1 03/13/2022   HGB 14.0 03/13/2022   HCT 44.0 03/13/2022   MCV 89.8 03/13/2022   PLT 296 03/13/2022   Lab Results  Component Value Date   IRON 36 (L) 12/11/2009   TIBC 318 12/11/2009   FERRITIN 19 12/11/2009    Attestation Statements:   Reviewed by clinician on day of visit: allergies, medications, problem list, medical history, surgical history, family history, social history, and previous encounter notes.  I, Davy Pique, am acting as Location manager for Loyal Gambler, DO.  I have reviewed the above documentation for accuracy and completeness, and I agree with the above. Dell Ponto, DO

## 2022-07-23 ENCOUNTER — Encounter: Payer: Self-pay | Admitting: Student

## 2022-07-23 ENCOUNTER — Ambulatory Visit: Payer: Medicaid Other | Admitting: Student

## 2022-07-23 VITALS — BP 115/75 | HR 64 | Ht 60.0 in | Wt 229.0 lb

## 2022-07-23 DIAGNOSIS — D2270 Melanocytic nevi of unspecified lower limb, including hip: Secondary | ICD-10-CM | POA: Diagnosis not present

## 2022-07-23 DIAGNOSIS — L84 Corns and callosities: Secondary | ICD-10-CM | POA: Diagnosis not present

## 2022-07-23 NOTE — Patient Instructions (Signed)
It was great to see you today! Thank you for choosing [facility] for your primary care. April [name] was seen for spot on foot.  Today we addressed: I suspect this is either a callus or wart.  It has more features consistent with a callus/corn which would benefit from keeping pressure off of it.  You may buy corn cushions such as the Dr. Felicie Morn picture below but any device that will keep pressure off of the area, it should help resolve it.  If this does not work, we may consider shaving it down and freezing it which would be useful in treatment of a wart.  Lets monitor it for a month while using these corn cushions and have you return if that does not resolve it.    If you haven't already, sign up for My Chart to have easy access to your labs results, and communication with your primary care physician.  Call the clinic at [phone] if your symptoms worsen or you have any concerns.  You should return to our clinic Return if symptoms worsen or fail to improve. Please arrive 15 minutes before your appointment to ensure smooth check in process.  We appreciate your efforts in making this happen.  Thank you for allowing me to participate in your care, Wells Guiles, DO 07/23/2022, 10:44 AM PGY-2, [place]

## 2022-07-23 NOTE — Assessment & Plan Note (Signed)
Corn/callus versus viral wart.  Most consistent with callus, recommended OTC corn cushion/donut pad to use always while on feet.  Return in 1 month if not resolved, to treat as viral wart with shaving and cryotherapy.

## 2022-07-23 NOTE — Progress Notes (Cosign Needed Addendum)
  SUBJECTIVE:   CHIEF COMPLAINT / HPI:   Right foot pain: Patient states she was here previously for this.  States that for the last month, especially during walking and physical therapy she has had pain on the bottom of her right foot.  Says she cannot see the spot but she can feel it.  She has not tried any medications.  She wears comfortable sandals with good padding and always wears socks.  Does not recall having this in the past.  PERTINENT  PMH / PSH: N/A  OBJECTIVE:  BP 115/75   Pulse 64   Ht 5' (1.524 m)   Wt 229 lb (103.9 kg)   SpO2 100%   BMI 44.72 kg/m  Physical Exam Constitutional:      Appearance: Normal appearance.  Musculoskeletal:       Feet:  Feet:     Right foot:     Skin integrity: Callus present.  Neurological:     Mental Status: She is alert.  Psychiatric:        Mood and Affect: Mood normal.        Behavior: Behavior normal.       ASSESSMENT/PLAN:  Callus Assessment & Plan: Corn/callus versus viral wart.  Most consistent with callus, recommended OTC corn cushion/donut pad to use always while on feet.  Return in 1 month if not resolved, to treat as viral wart with shaving and cryotherapy.   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  Will monitor.   Return if symptoms worsen or fail to improve. Wells Guiles, DO 07/24/2022, 8:20 AM PGY-2, [facility]

## 2022-07-24 NOTE — Assessment & Plan Note (Signed)
Well-circumscribed, pigmentation even throughout, consistent with benign nevus.  Will monitor.

## 2022-08-05 ENCOUNTER — Other Ambulatory Visit: Payer: Self-pay | Admitting: Obstetrics and Gynecology

## 2022-08-05 NOTE — Patient Instructions (Signed)
HI April [name], thanks for speaking with me, don't forget to call healthy Blue and [employer] if needed.  April [name] was given information about Medicaid Managed Care team care coordination services as a part of their Healthy [facility] Medicaid benefit. April [name] verbally consented to engagement with the [facility] Managed Care team.   If you are experiencing a medical emergency, please call 911 or report to your local emergency department or urgent care.   If you have a non-emergency medical problem during routine business hours, please contact your provider's office and ask to speak with a nurse.   For questions related to your Healthy [facility] health plan, please call: [phone] or visit the homepage here: [email or web address]  If you would like to schedule transportation through your Healthy [facility] plan, please call the following number at least 2 days in advance of your appointment: [phone]  For information about your ride after you set it up, call Ride Assist at [phone]. Use this number to activate a Will Call pickup, or if your transportation is late for a scheduled pickup. Use this number, too, if you need to make a change or cancel a previously scheduled reservation.  If you need transportation services right away, call [phone]. The after-hours call center is staffed 24 hours to handle ride assistance and urgent reservation requests (including discharges) 365 days a year. Urgent trips include sick visits, hospital discharge requests and life-sustaining treatment.  Call the [place] at [phone], at any time, 24 hours a day, 7 days a week. If you are in danger or need immediate medical attention call 911.  If you would like help to quit smoking, call 1-800-QUIT-NOW 941-612-0299) OR Espaol: 1-855-Djelo-Ya (9-485-462-7035) o para ms informacin haga clic aqu or Text READY to 200-400 to register  via text  April [name] - following are the goals we discussed in your visit today:   Goals Addressed    Timeframe:  Long-Range Goal Priority:  High Start Date:         05/22/22                    Expected End Date:  ongoing                   Follow Up 09/07/22   - practice safe sex - schedule appointment for flu shot - schedule appointment for vaccines needed due to my age or health - schedule recommended health tests (blood work, mammogram, colonoscopy, pap test) - schedule and keep appointment for annual check-up    Why is this important?   Screening tests can find diseases early when they are easier to treat.  Your doctor or nurse will talk with you about which tests are important for you.  Getting shots for common diseases like the flu and shingles will help prevent them.  08/05/22:  Patient continues therapy services and [facility] visits.  Patient verbalizes understanding of instructions and care plan provided today and agrees to view in [place]. Active MyChart status and patient understanding of how to access instructions and care plan via MyChart confirmed with patient.     The Managed Medicaid care management team will reach out to the patient again over the next 30 business  days.  The  Patient  has been provided with contact information for the Managed Medicaid care management team and has been advised to call with any health related questions or concerns.   April [name] Ambulance person,  [place] Management Coordinator - Managed [name] High Risk [phone]   Following is a copy of your plan of care:  Care Plan : [place] of Care  Updates made by [name], RN since 08/05/2022 12:00 AM     Problem: Health Promotion or Disease Self-Management (General Plan of Care)      Long-Range Goal: Chronic Disease Management and Care Coordination Needs/SDOH   Start Date: 05/22/2022  Expected End Date: 11/05/2022   Priority: High  Note:   Current Barriers:  Care Coordination needs related to Limited access to food resources and utility resources-completed. Chronic Disease Management HTN, GERD, osteoarthritis, low back pain, obesity, anxiety/depression, delusional disorder. 08/05/22:  Discussed food resources and utility assistance with patient-patient has food resources and discussed [employer] benefits-she will f/u with [employer] for fruits and vegetables as offerd.  Patient will also f/u with [employer] for payment plan option-has utility resources as well.  No complaints today-BP stable-108-119/75  RNCM Clinical Goal(s):  Patient will take all medications exactly as prescribed and will call provider for medication related questions as evidenced by patient report demonstrate understanding of rationale for each prescribed medication as evidenced by patient report attend all scheduled medical appointments as evidenced by patient report continue to work with RN Care Manager to address care management and care coordination needs related to [place]. HTN, GERD, osteoarthritis, LBP, obesity, anxiety/depression as evidenced by adherence to CM Team Scheduled appointments work with [employer] care guide to address needs related to  Limited access to food and utility resources as evidenced by patient and/or community resource care guide support through collaboration with Consulting civil engineer, provider, and care team.   Interventions: Inter-disciplinary care team collaboration (see longitudinal plan of care) Evaluation of current treatment plan related to  self management and patient's adherence to plan as established by provider Care Guide referral for utility resources and food resources-completed. Collaborated with Care Guide for food and utility resources  SDOH Barriers (Status:  New goal.) Long Term Goal Patient interviewed and SDOH assessment performed        SDOH Interventions    Flowsheet Row  Most Recent Value  SDOH Interventions   Food Insecurity Interventions Other (Comment)  [Care Guide referral]  Transportation Interventions Intervention Not Indicated     Patient interviewed and appropriate assessments performed Referred patient to community resources care guide team for assistance with food and utility resources. Discussed plans with patient for ongoing care management follow up and provided patient with direct contact information for care management team  Patient Goals/Self-Care Activities: Take all medications as prescribed Attend all scheduled provider appointments Call pharmacy for medication refills 3-7 days in advance of running out of medications Perform all self care activities independently  Perform IADL's (shopping, preparing meals, housekeeping, managing finances) independently Call provider office for new concerns or questions  Patient will contact [employer] and [employer]  Follow Up Plan:  The patient has been provided with contact information for the care management team and has been advised to call with any health related questions or concerns.  The care management team will reach out to the patient again over the next 30 business  days.

## 2022-08-05 NOTE — Patient Outreach (Signed)
Medicaid Managed Care   Nurse Care Manager Note  08/05/2022 Name:  April [name] MRN:  [id number] DOB:  Dec 18, 1960  April [name] is an 61 y.o. year old female who is a primary patient of [place], [place], DO.  The [facility] Managed Care Coordination team was consulted for assistance with:    Chronic healthcare management needs, HTN, GERD, osteoarthritis, LBP, anxiety/depression  April [name] was given information about Medicaid Managed Care Coordination team services today. April [name] Patient agreed to services and verbal consent obtained.  Engaged with patient by telephone for follow up visit in response to provider referral for case management and/or care coordination services.   Assessments/Interventions:  Review of past medical history, allergies, medications, health status, including review of consultants reports, laboratory and other test data, was performed as part of comprehensive evaluation and provision of chronic care management services.  SDOH (Social Determinants of Health) assessments and interventions performed: SDOH Interventions    Flowsheet Row Patient Outreach Telephone from 08/05/2022 in [place] Patient Outreach Telephone from 07/02/2022 in [place] Coordination Telephone from 05/25/2022 in [place] Patient Outreach Telephone from 05/22/2022 in [place] Interventions      Food Insecurity Interventions -- -- Other (Comment)  [Mailed food pantry list] Other (Comment)  [Care Guide referral]  Transportation Interventions -- -- -- Intervention Not Indicated  Utilities Interventions -- Ambulatory [id number] Order  [resources received in August] -- --  Alcohol Usage Interventions -- Intervention Not Indicated (Score <7) -- --  Financial Strain Interventions -- -- Other (Comment)  [mailed resources for  utility assistance.] --  Stress Interventions Provide Counseling  [sees therapist] -- -- --       Care Plan  Allergies  Allergen Reactions   Percocet [Oxycodone-Acetaminophen] Swelling   Naproxen Palpitations    Medications Reviewed Today     Reviewed by [name], RN (Registered Nurse) on 08/05/22 at [place] List Status: <None>   Medication Order Taking? Sig Documenting Provider Last Dose Status Informant  benztropine (COGENTIN) 1 MG tablet [id number] No Take 1 tablet (1 mg total) by mouth daily. Freida Busman, MD Taking Active   diclofenac Sodium (VOLTAREN) 1 % GEL [id number] No APPLY 2 GRAMS TOPICALLY TO THE AFFECTED AREA FOUR TIMES DAILY  Patient taking differently: 2 g 4 (four) times daily as needed (Apply to affected area for pain).   Donney Dice, DO Taking Active Self  haloperidol (HALDOL) 1 MG tablet [id number] No Take 1 tablet (1 mg total) by mouth 2 (two) times daily. Freida Busman, MD Taking Active   hydrochlorothiazide (HYDRODIURIL) 25 MG tablet [id number]  Take 1 tablet (25 mg total) by mouth daily. Bowen, Collene Leyden, DO  Active   topiramate (TOPAMAX) 25 MG tablet [id number]  Take 1 tablet (25 mg total) by mouth 2 (two) times daily. Bowen, Collene Leyden, DO  Active   Vitamin D, Ergocalciferol, (DRISDOL) 1.25 MG (50000 UNIT) CAPS capsule [id number]  One Capsule by mouth every 10 days Valetta Close Collene Leyden, DO  Active             Patient Active Problem List   Diagnosis Date Noted   Callus 07/23/2022   Benign nevus of plantar aspect of foot 06/29/2022   Delusional disorder ([place]) 03/14/2022   Pap smear for cervical cancer screening 09/16/2021   Tension headache 01/01/2021   Essential hypertension 08/15/2020   Insulin resistance  07/11/2019   Class 3 severe obesity with serious comorbidity and body mass index (BMI) of 50.0 to 59.9 in adult ([place]) 07/11/2019   Vitamin D deficiency 03/15/2019   Vitiligo 03/03/2018   Insomnia 11/05/2017   Fatigue 03/02/2016   Osteoarthritis  of knees, bilateral 09/23/2015   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   Low back pain 01/27/2012   GERD 03/22/2009   Anxiety state 11/14/2008   Eczema 05/18/2008   Depression 03/10/2007   HYPERCHOLESTEROLEMIA 12/09/2006   Anemia 12/09/2006   Conditions to be addressed/monitored per PCP order:  Chronic healthcare management needs, HTN, GERD, osteoarthritis, LBP, anxiety/depression  Care Plan : RN Care Manager Plan of Care  Updates made by [name], RN since 08/05/2022 12:00 AM     Problem: Health Promotion or Disease Self-Management (General Plan of Care)      Long-Range Goal: Chronic Disease Management and Care Coordination Needs/SDOH   Start Date: 05/22/2022  Expected End Date: 11/05/2022  Priority: High  Note:   Current Barriers:  Care Coordination needs related to Limited access to food resources and utility resources-completed. Chronic Disease Management HTN, GERD, osteoarthritis, low back pain, obesity, anxiety/depression, delusional disorder. 08/05/22:  Discussed food resources and utility assistance with patient-patient has food resources and discussed [employer] benefits-she will f/u with [employer] for fruits and vegetables as offerd.  Patient will also f/u with [employer] for payment plan option-has utility resources as well.  No complaints today-BP stable-108-119/75  RNCM Clinical Goal(s):  Patient will take all medications exactly as prescribed and will call provider for medication related questions as evidenced by patient report demonstrate understanding of rationale for each prescribed medication as evidenced by patient report attend all scheduled medical appointments as evidenced by patient report continue to work with RN Care Manager to address care management and care coordination needs related to [place]. HTN, GERD, osteoarthritis, LBP, obesity, anxiety/depression as evidenced by adherence to CM Team Scheduled appointments work with [employer] care  guide to address needs related to  Limited access to food and utility resources as evidenced by patient and/or community resource care guide support through collaboration with Consulting civil engineer, provider, and care team.   Interventions: Inter-disciplinary care team collaboration (see longitudinal plan of care) Evaluation of current treatment plan related to  self management and patient's adherence to plan as established by provider Care Guide referral for utility resources and food resources-completed. Collaborated with Care Guide for food and utility resources  SDOH Barriers (Status:  New goal.) Long Term Goal Patient interviewed and SDOH assessment performed        SDOH Interventions    Flowsheet Row Most Recent Value  SDOH Interventions   Food Insecurity Interventions Other (Comment)  [Care Guide referral]  Transportation Interventions Intervention Not Indicated     Patient interviewed and appropriate assessments performed Referred patient to community resources care guide team for assistance with food and utility resources. Discussed plans with patient for ongoing care management follow up and provided patient with direct contact information for care management team  Patient Goals/Self-Care Activities: Take all medications as prescribed Attend all scheduled provider appointments Call pharmacy for medication refills 3-7 days in advance of running out of medications Perform all self care activities independently  Perform IADL's (shopping, preparing meals, housekeeping, managing finances) independently Call provider office for new concerns or questions  Patient will contact [employer] and [employer]  Follow Up Plan:  The patient has been provided with contact information for the care management  team and has been advised to call with any health related questions or concerns.  The care management team will reach out to the patient again over the next 30 business  days.   Long-Range  Goal: Establish Plan of Care for Chronic Disease Management Needs/SDOH   Priority: High  Note:   Timeframe:  Long-Range Goal Priority:  High Start Date:         05/22/22                    Expected End Date:  ongoing                   Follow Up 09/07/22   - practice safe sex - schedule appointment for flu shot - schedule appointment for vaccines needed due to my age or health - schedule recommended health tests (blood work, mammogram, colonoscopy, pap test) - schedule and keep appointment for annual check-up    Why is this important?   Screening tests can find diseases early when they are easier to treat.  Your doctor or nurse will talk with you about which tests are important for you.  Getting shots for common diseases like the flu and shingles will help prevent them.  08/05/22:  Patient continues therapy services and [facility] visits.   Follow Up:  Patient agrees to Care Plan and Follow-up.  Plan: The Managed Medicaid care management team will reach out to the patient again over the next 30 business  days. and The  Patient has been provided with contact information for the Managed Medicaid care management team and has been advised to call with any health related questions or concerns.  Date/time of next scheduled RN care management/care coordination outreach:  09/07/22 at 230.

## 2022-08-06 ENCOUNTER — Telehealth: Payer: Self-pay

## 2022-08-06 NOTE — Telephone Encounter (Signed)
Patient calls nurse line regarding tramadol refill.  She is a patient at [facility].    She states that she has been having issues getting filled from the orthopedic provider.   She was told if she was having issues, she could request from PCP.   Called pharmacy. Patient last picked up medication on 10/5. Tramadol 50 mg quantity of 10 tablets. Directions to take 1 tablet by mouth every 8 hours.   Patient is requesting that PCP send in refill.   *Of note, patient's insurance is only covering five day supply.   Talbot Grumbling, RN

## 2022-08-07 NOTE — Telephone Encounter (Signed)
Patient calls nurse line again in regards to Tramadol.   Patient reports she has been trying to get Tramadol filled through [facility], however their system is down. Patient reports she spoke with them this morning as well.   Patient reports she would prefer to have Tramadol refills through PCP, as getting medication through ortho has been a "constant struggle."  I have schedule patient with PCP for 11/14 to discuss.  Patient is asking for a refill.

## 2022-08-07 NOTE — Telephone Encounter (Signed)
Called patient and informed of provider message. Patient verbalizes understanding.   Talbot Grumbling, RN

## 2022-08-12 ENCOUNTER — Ambulatory Visit (HOSPITAL_COMMUNITY)
Admission: EM | Admit: 2022-08-12 | Discharge: 2022-08-12 | Disposition: A | Discharge status: Home / Self Care | Payer: Medicaid Other | Attending: Internal Medicine | Admitting: Internal Medicine

## 2022-08-12 ENCOUNTER — Encounter (HOSPITAL_COMMUNITY): Payer: Self-pay

## 2022-08-12 DIAGNOSIS — L299 Pruritus, unspecified: Secondary | ICD-10-CM

## 2022-08-12 DIAGNOSIS — J029 Acute pharyngitis, unspecified: Secondary | ICD-10-CM | POA: Diagnosis not present

## 2022-08-12 LAB — POCT RAPID STREP A, ED / UC: Streptococcus, Group A Screen (Direct): NEGATIVE

## 2022-08-12 MED ORDER — CETIRIZINE HCL 10 MG PO TABS: 10.0000 mg | ORAL_TABLET | Freq: Every day | ORAL | 2 refills | Status: DC

## 2022-08-12 NOTE — Discharge Instructions (Addendum)
Your sore throat is likely related to sleeping with a fan on and dry air. I would like for you to start taking cetirizine once daily to help with itching to the ears and postnasal drainage. Use salt water gargles and increase your water intake to at least 64 ounces of water per day to stay well-hydrated. If your symptoms get worse or fail to improve in the next couple of days, please return to urgent care for reevaluation. Otherwise, follow-up with your primary care provider for further evaluation. I hope you feel better!
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
## 2022-08-12 NOTE — ED Triage Notes (Signed)
Pt is here for bilateral ear pain and sore throat since this morning. Pt took aleeve at [employer] today

## 2022-08-13 ENCOUNTER — Ambulatory Visit (INDEPENDENT_AMBULATORY_CARE_PROVIDER_SITE_OTHER): Payer: Medicaid Other | Admitting: Family Medicine

## 2022-08-13 ENCOUNTER — Encounter (INDEPENDENT_AMBULATORY_CARE_PROVIDER_SITE_OTHER): Payer: Self-pay | Admitting: Family Medicine

## 2022-08-13 VITALS — BP 118/75 | HR 73 | Temp 97.9°F | Ht 60.0 in | Wt 226.0 lb

## 2022-08-13 DIAGNOSIS — F28 Other psychotic disorder not due to a substance or known physiological condition: Secondary | ICD-10-CM | POA: Diagnosis not present

## 2022-08-13 DIAGNOSIS — E669 Obesity, unspecified: Secondary | ICD-10-CM

## 2022-08-13 DIAGNOSIS — E559 Vitamin D deficiency, unspecified: Secondary | ICD-10-CM | POA: Diagnosis not present

## 2022-08-13 DIAGNOSIS — R632 Polyphagia: Secondary | ICD-10-CM

## 2022-08-13 DIAGNOSIS — Z6841 Body Mass Index (BMI) 40.0 and over, adult: Secondary | ICD-10-CM

## 2022-08-13 DIAGNOSIS — I10 Essential (primary) hypertension: Secondary | ICD-10-CM | POA: Diagnosis not present

## 2022-08-13 MED ORDER — TOPIRAMATE 50 MG PO TABS: 50.0000 mg | ORAL_TABLET | Freq: Two times a day (BID) | ORAL | 0 refills | Status: DC

## 2022-08-13 MED ORDER — VITAMIN D (ERGOCALCIFEROL) 1.25 MG (50000 UNIT) PO CAPS: ORAL_CAPSULE | ORAL | 0 refills | Status: DC

## 2022-08-13 MED ORDER — HYDROCHLOROTHIAZIDE 25 MG PO TABS: 25.0000 mg | ORAL_TABLET | Freq: Every day | ORAL | 0 refills | Status: DC

## 2022-08-14 ENCOUNTER — Ambulatory Visit: Payer: Medicaid Other

## 2022-08-25 ENCOUNTER — Encounter: Payer: Self-pay | Admitting: Family Medicine

## 2022-08-25 ENCOUNTER — Ambulatory Visit: Payer: Medicaid Other | Admitting: Family Medicine

## 2022-08-25 VITALS — BP 119/83 | HR 66 | Ht 60.0 in | Wt 229.1 lb

## 2022-08-25 DIAGNOSIS — M25562 Pain in left knee: Secondary | ICD-10-CM | POA: Diagnosis present

## 2022-08-25 DIAGNOSIS — G8929 Other chronic pain: Secondary | ICD-10-CM | POA: Diagnosis not present

## 2022-08-25 DIAGNOSIS — Z23 Encounter for immunization: Secondary | ICD-10-CM | POA: Diagnosis not present

## 2022-08-25 NOTE — Patient Instructions (Signed)
It was great seeing you today!  Today we discussed your pain medication. I agree that having this would help your knee pain improve and allow you to exercise and stay active. Please discuss the tramadol prescription with your orthopedics doctor next week to see if this technical issue with tramadol refills can be resolved.   Please follow up at your next scheduled appointment in 3-6 months, if anything arises between now and then, please don't hesitate to contact our office.   Thank you for allowing [place] to be a part of your medical care!  Thank you, Dr. Larae Grooms  Also a reminder of our clinic's no-show policy. Please make sure to arrive at least 15 minutes prior to your scheduled appointment time. Please try to cancel before 24 hours if you are not able to make it. If you no-show for 2 appointments then you will be receiving a warning letter. If you no-show after 3 visits, then you may be at risk of being dismissed from our clinic. This is to ensure that everyone is able to be seen in a timely manner. Thank you, we appreciate your assistance with this!

## 2022-08-25 NOTE — Progress Notes (Signed)
    SUBJECTIVE:   CHIEF COMPLAINT / HPI:   Patient presents for follow up for left knee pain that has been chronic. She was initially seeing ortho who was prescribed tramadol and is having issues with this prescription due to the system being down. She last had a prescription for tramadol was 10 days ago, she last took tramadol the day before yesterday. Follows with Raliegh Ip, sees then every 3 months. She sees the orthopedics office next week but they were having technically issues. I agreed at a prior visit to prescribe should I need to. She is able to be active with the tramadol. Going to healthy weight and wellness.   OBJECTIVE:   BP 119/83   Pulse 66   Ht 5' (1.524 m)   Wt 229 lb 2 oz (103.9 kg)   SpO2 100%   BMI 44.75 kg/m   General: Patient well-appearing, in no acute distress. Resp: normal work of breathing and breathing comfortably on room air MSK: no gross abnormality along knees bilaterally, no point tenderness noted, no crepitus noted  Neuro: full active and passive range of motion, 5/5 LE strength bilaterally, normal gait   ASSESSMENT/PLAN:   Left knee pain -chronic and stable -patient would definitely benefit from tramadol as this has improved her quality of life and allows her to exercise and stay active which has improved her overall well-being and health. Given that patient has a visit with ortho next week, instructed her to follow up and inquire about tramadol. I am happy to prescribe if ortho is no longer doing so. -follow up in 6 months or sooner as appropriate    -PHQ-9 score of 3 with negative question 9 reviewed.     Donney Dice, [place]

## 2022-08-25 NOTE — Assessment & Plan Note (Signed)
-  chronic and stable -patient would definitely benefit from tramadol as this has improved her quality of life and allows her to exercise and stay active which has improved her overall well-being and health. Given that patient has a visit with ortho next week, instructed her to follow up and inquire about tramadol. I am happy to prescribe if ortho is no longer doing so. -follow up in 6 months or sooner as appropriate

## 2022-08-26 NOTE — Progress Notes (Unsigned)
Chief Complaint:   OBESITY April [name] is here to discuss her progress with her obesity treatment plan along with follow-up of her obesity related diagnoses. April [name] is on keeping a food journal and adhering to recommended goals of 1200 calories and 75 protein and states she is following her eating plan approximately 80% of the time. April [name] states she is walking 30-45 minutes 3-4 times per week.  Today's visit was #: 25 Starting weight: 256 lbs Starting date: 06/13/2019 Today's weight: 226 lbs Today's date: 08/13/2022 Total lbs lost to date: 30 lbs Total lbs lost since last in-office visit: 5 lbs  Interim History: Started adding weight [facility] at the [facility] and is walking more.  Brought in food log.  Lacking fruit but has started buying cut up fruit.  Picky about some veggies.  Trying to get more water in.   Subjective:   1. Polyphagia Complains of increased hunger.  Food log reviewed.  Recommend increase in fruits and veggies.  She is taking Topamax 25 mg BID without adverse side effects.    2. Vitamin D deficiency Discussed labs with patient today. She is currently taking prescription vitamin D 50,000 IU each week. She denies nausea, vomiting or muscle weakness. Vitamin D level improved to 67.  Energy level stable.   3. Essential hypertension Doing well on HCTZ 25 mg daily without adverse side effects.  She denies chest pain.   Assessment/Plan:   1. Polyphagia Increase - topiramate (TOPAMAX) 50 MG tablet; Take 1 tablet (50 mg total) by mouth 2 (two) times daily.  Dispense: 60 tablet; Refill: 0  2. Vitamin D deficiency Reduce frequency of Vitamin D to every 14 days. Recheck level in 3-4 months.   Change - Vitamin D, Ergocalciferol, (DRISDOL) 1.25 MG (50000 UNIT) CAPS capsule; One Capsule by mouth every 14 days  Dispense: 4 capsule; Refill: 0  3. Essential hypertension Refill - hydrochlorothiazide (HYDRODIURIL) 25 MG tablet; Take 1 tablet (25 mg total) by mouth daily.   Dispense: 90 tablet; Refill: 0  4. Obesity, current BMI 44.3 1) Continue to increase fruit to 2 servings per day.  2) Try adding one new vegetable each week.   April [name] is currently in the action stage of change. As such, her goal is to continue with weight loss efforts. She has agreed to keeping a food journal and adhering to recommended goals of 1200 calories and 75 protein daily.   Exercise goals:  As is.   Behavioral modification strategies: increasing lean protein intake, increasing vegetables, increasing water intake, decreasing liquid calories, decreasing eating out, no skipping meals, meal planning and cooking strategies, keeping healthy foods in the home, and decreasing junk food.  April [name] has agreed to follow-up with our clinic in 4 weeks. She was informed of the importance of frequent follow-up visits to maximize her success with intensive lifestyle modifications for her multiple health conditions.   Objective:   Blood pressure 118/75, pulse 73, temperature 97.9 F (36.6 C), height 5' (1.524 m), weight 226 lb (102.5 kg), SpO2 98 %. Body mass index is 44.14 kg/m.  General: Cooperative, alert, well developed, in no acute distress. HEENT: Conjunctivae and lids unremarkable. Cardiovascular: Regular rhythm.  Lungs: Normal work of breathing. Neurologic: No focal deficits.   Lab Results  Component Value Date   CREATININE 0.83 07/14/2022   BUN 12 07/14/2022   NA 141 07/14/2022   K 4.9 07/14/2022   CL 102 07/14/2022   CO2 24 07/14/2022   Lab Results  Component Value  Date   ALT 14 07/14/2022   AST 18 07/14/2022   ALKPHOS 67 07/14/2022   BILITOT 0.3 07/14/2022   Lab Results  Component Value Date   HGBA1C 5.5 03/13/2022   HGBA1C 5.6 11/06/2021   HGBA1C 5.5 04/29/2021   HGBA1C 7.3 (H) 04/07/2021   HGBA1C 5.3 11/06/2020   Lab Results  Component Value Date   INSULIN 7.3 11/06/2021   INSULIN 3.6 04/07/2021   INSULIN 13.8 11/06/2020   INSULIN 11.6 07/10/2020    INSULIN 7.5 03/20/2020   Lab Results  Component Value Date   TSH 0.949 03/13/2022   Lab Results  Component Value Date   CHOL 198 03/13/2022   HDL 52 03/13/2022   LDLCALC 129 (H) 03/13/2022   LDLDIRECT 153 (H) 08/16/2020   TRIG 87 03/13/2022   CHOLHDL 3.8 03/13/2022   Lab Results  Component Value Date   VD25OH 79.9 11/06/2021   VD25OH 65.2 04/07/2021   VD25OH 61.9 11/06/2020   Lab Results  Component Value Date   WBC 8.1 03/13/2022   HGB 14.0 03/13/2022   HCT 44.0 03/13/2022   MCV 89.8 03/13/2022   PLT 296 03/13/2022   Lab Results  Component Value Date   IRON 36 (L) 12/11/2009   TIBC 318 12/11/2009   FERRITIN 19 12/11/2009   Attestation Statements:   Reviewed by clinician on day of visit: allergies, medications, problem list, medical history, surgical history, family history, social history, and previous encounter notes.  I, Davy Pique, am acting as Location manager for Loyal Gambler, DO.  I have reviewed the above documentation for accuracy and completeness, and I agree with the above. Collene Leyden Dayyan Krist, DO  I have personally spent 45 minutes total time today in preparation, patient care, and documentation for this visit, including the following: review of clinical lab tests; review of medical tests/procedures/services.

## 2022-09-01 DIAGNOSIS — M1712 Unilateral primary osteoarthritis, left knee: Secondary | ICD-10-CM | POA: Diagnosis not present

## 2022-09-07 ENCOUNTER — Ambulatory Visit: Payer: Medicaid Other | Admitting: Obstetrics and Gynecology

## 2022-09-08 ENCOUNTER — Other Ambulatory Visit (INDEPENDENT_AMBULATORY_CARE_PROVIDER_SITE_OTHER): Payer: Self-pay | Admitting: Family Medicine

## 2022-09-08 ENCOUNTER — Ambulatory Visit (INDEPENDENT_AMBULATORY_CARE_PROVIDER_SITE_OTHER): Payer: Medicaid Other | Admitting: Family Medicine

## 2022-09-08 ENCOUNTER — Encounter (INDEPENDENT_AMBULATORY_CARE_PROVIDER_SITE_OTHER): Payer: Self-pay | Admitting: Family Medicine

## 2022-09-08 VITALS — BP 119/69 | HR 65 | Temp 98.4°F | Ht 60.0 in | Wt 226.0 lb

## 2022-09-08 DIAGNOSIS — E559 Vitamin D deficiency, unspecified: Secondary | ICD-10-CM

## 2022-09-08 DIAGNOSIS — E669 Obesity, unspecified: Secondary | ICD-10-CM | POA: Diagnosis not present

## 2022-09-08 DIAGNOSIS — I10 Essential (primary) hypertension: Secondary | ICD-10-CM | POA: Diagnosis not present

## 2022-09-08 DIAGNOSIS — Z6841 Body Mass Index (BMI) 40.0 and over, adult: Secondary | ICD-10-CM

## 2022-09-08 DIAGNOSIS — R632 Polyphagia: Secondary | ICD-10-CM

## 2022-09-08 MED ORDER — TOPIRAMATE 25 MG PO TABS: 25.0000 mg | ORAL_TABLET | Freq: Two times a day (BID) | ORAL | 0 refills | Status: DC

## 2022-09-08 MED ORDER — VITAMIN D (ERGOCALCIFEROL) 1.25 MG (50000 UNIT) PO CAPS: ORAL_CAPSULE | ORAL | 0 refills | Status: DC

## 2022-09-14 ENCOUNTER — Other Ambulatory Visit: Payer: Medicaid Other | Admitting: Obstetrics and Gynecology

## 2022-09-14 ENCOUNTER — Encounter: Payer: Self-pay | Admitting: Obstetrics and Gynecology

## 2022-09-14 NOTE — Patient Outreach (Signed)
Medicaid Managed Care   Nurse Care Manager Note  09/14/2022 Name:  April [name] MRN:  [id number] DOB:  [birth year]  April [name] is an 61 y.o. year old female who is a primary patient of Harrison, [place], DO.  The [facility] Managed Care Coordination team was consulted for assistance with:    Chronic healthcare management needs, HTN, GERD, osteoarthtitis, anxiety/depression  April [name] was given information about Medicaid Managed Care Coordination team services today. April [name] Patient agreed to services and verbal consent obtained.  Engaged with patient by telephone for follow up visit in response to provider referral for case management and/or care coordination services.   Assessments/Interventions:  Review of past medical history, allergies, medications, health status, including review of consultants reports, laboratory and other test data, was performed as part of comprehensive evaluation and provision of chronic care management services.  SDOH (Social Determinants of Health) assessments and interventions performed: SDOH Interventions    Flowsheet Row Patient Outreach Telephone from 09/14/2022 in [place] Patient Outreach Telephone from 08/05/2022 in [place] Patient Outreach Telephone from 07/02/2022 in [place] Coordination Telephone from 05/25/2022 in [place] Patient Outreach Telephone from 05/22/2022 in [place] Interventions       Food Insecurity Interventions -- -- -- Other (Comment)  [Mailed food pantry list] Other (Comment)  Engineer, maintenance Guide referral]  Housing Interventions Intervention Not Indicated -- -- -- --  Transportation Interventions -- -- -- -- Intervention Not Indicated  Utilities Interventions -- -- Ambulatory [id number] Order  [resources received in August] -- --   Alcohol Usage Interventions -- -- Intervention Not Indicated (Score <7) -- --  Financial Strain Interventions -- -- -- Other (Comment)  [mailed resources for utility assistance.] --  Stress Interventions -- Provide Counseling  [sees therapist] -- -- --     Care Plan  Allergies  Allergen Reactions   Percocet [Oxycodone-Acetaminophen] Swelling   Naproxen Palpitations    Medications Reviewed Today     Reviewed by April [name], RN (Registered Nurse) on 09/14/22 at 1136  Med List Status: <None>   Medication Order Taking? Sig Documenting Provider Last Dose Status Informant  benztropine (COGENTIN) 1 MG tablet [id number] No Take 1 tablet (1 mg total) by mouth daily. Freida Busman, MD Taking Active   cetirizine (ZYRTEC) 10 MG tablet [id number] No Take 1 tablet (10 mg total) by mouth daily. Talbot Grumbling, FNP Taking Active   diclofenac Sodium (VOLTAREN) 1 % GEL [id number] No APPLY 2 GRAMS TOPICALLY TO THE AFFECTED AREA FOUR TIMES DAILY  Patient taking differently: 2 g 4 (four) times daily as needed (Apply to affected area for pain).   Donney Dice, DO Taking Active Self  haloperidol (HALDOL) 1 MG tablet [id number] No Take 1 tablet (1 mg total) by mouth 2 (two) times daily. Freida Busman, MD Taking Active   hydrochlorothiazide (HYDRODIURIL) 25 MG tablet [id number] No Take 1 tablet (25 mg total) by mouth daily. Bowen, Collene Leyden, DO Taking Active   topiramate (TOPAMAX) 25 MG tablet [id number] No Take 1 tablet (25 mg total) by mouth 2 (two) times daily. Bowen, Collene Leyden, DO Taking Active   traMADol (ULTRAM) 50 MG tablet [id number] No Take 50 mg by mouth every 8 (eight) hours as needed. [provider] Taking Active   Vitamin D, Ergocalciferol, (DRISDOL) 1.25 MG (50000 UNIT) CAPS capsule [id number] No One Capsule by  mouth every 14 days Dell Ponto, DO Taking Active            Patient Active Problem List   Diagnosis Date Noted   Left knee pain 08/25/2022   Polyphagia 08/13/2022    Callus 07/23/2022   Benign nevus of plantar aspect of foot 06/29/2022   Delusional disorder ([place]) 03/14/2022   Pap smear for cervical cancer screening 09/16/2021   Tension headache 01/01/2021   Essential hypertension 08/15/2020   Insulin resistance 07/11/2019   Class 3 severe obesity with serious comorbidity and body mass index (BMI) of 50.0 to 59.9 in adult [facility]) 07/11/2019   Vitamin D deficiency 03/15/2019   Vitiligo 03/03/2018   Insomnia 11/05/2017   Fatigue 03/02/2016   Osteoarthritis of knees, bilateral 09/23/2015   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   Low back pain 01/27/2012   GERD 03/22/2009   Anxiety state 11/14/2008   Eczema 05/18/2008   Depression 03/10/2007   HYPERCHOLESTEROLEMIA 12/09/2006   Anemia 12/09/2006   Conditions to be addressed/monitored per PCP order:  Chronic healthcare management needs, HTN, GERD, osteoarthtitis, anxiety/depression  Care Plan : RN Care Manager Plan of Care  Updates made by April [name], RN since 09/14/2022 12:00 AM     Problem: Health Promotion or Disease Self-Management (General Plan of Care)      Long-Range Goal: Chronic Disease Management and Care Coordination Needs/SDOH   Start Date: 05/22/2022  Expected End Date: 11/05/2022  Priority: High  Note:   Current Barriers:  Care Coordination needs related to Limited access to food resources and utility resources-completed. Chronic Disease Management HTN, GERD, osteoarthritis, low back pain, obesity, anxiety/depression, delusional disorder. 09/13/22:  No complaints today, steroid injection left knee helpful.  BP stable.  Continues to see [facility].  RNCM Clinical Goal(s):  Patient will take all medications exactly as prescribed and will call provider for medication related questions as evidenced by patient report demonstrate understanding of rationale for each prescribed medication as evidenced by patient report attend all scheduled medical appointments as  evidenced by patient report continue to work with RN Care Manager to address care management and care coordination needs related to SDOH. HTN, GERD, osteoarthritis, LBP, obesity, anxiety/depression as evidenced by adherence to CM Team Scheduled appointments work with [employer] care guide to address needs related to  Limited access to food and utility resources as evidenced by patient and/or community resource care guide support through collaboration with Consulting civil engineer, provider, and care team.   Interventions: Inter-disciplinary care team collaboration (see longitudinal plan of care) Evaluation of current treatment plan related to  self management and patient's adherence to plan as established by provider Care Guide referral for utility resources and food resources-completed. Collaborated with Care Guide for food and utility resources  Hypertension Interventions:  (Status:  New goal.) Long Term Goal Last practice recorded BP readings:  BP Readings from Last 3 Encounters:  09/08/22 119/69  08/25/22 119/83  08/13/22 118/75  Most recent eGFR/CrCl:  Lab Results  Component Value Date   EGFR 81 07/14/2022    No components found for: "CRCL"  Evaluation of current treatment plan related to hypertension self management and patient's adherence to plan as established by provider Reviewed medications with patient and discussed importance of compliance Discussed plans with patient for ongoing care management follow up and provided patient with direct contact information for care management team Advised patient, providing education and rationale, to monitor blood pressure daily and record, calling PCP for findings outside  established parameters Reviewed scheduled/upcoming provider appointments  Assessed social determinant of health barriers  SDOH Barriers (Status:  New goal.) Long Term Goal Patient interviewed and SDOH assessment performed        SDOH Interventions    Flowsheet Row Most  Recent Value  SDOH Interventions   Food Insecurity Interventions Other (Comment)  [Care Guide referral]  Transportation Interventions Intervention Not Indicated     Patient interviewed and appropriate assessments performed Referred patient to community resources care guide team for assistance with food and utility resources. Discussed plans with patient for ongoing care management follow up and provided patient with direct contact information for care management team  Patient Goals/Self-Care Activities: Take all medications as prescribed Attend all scheduled provider appointments Call pharmacy for medication refills 3-7 days in advance of running out of medications Perform all self care activities independently  Perform IADL's (shopping, preparing meals, housekeeping, managing finances) independently Call provider office for new concerns or questions  Patient will contact [employer] and [employer]  Follow Up Plan:  The patient has been provided with contact information for the care management team and has been advised to call with any health related questions or concerns.  The care management team will reach out to the patient again over the next 30 business  days.   Long-Range Goal: Establish Plan of Care for Chronic Disease Management Needs/SDOH   Priority: High  Note:   Timeframe:  Long-Range Goal Priority:  High Start Date:         05/22/22                    Expected End Date:  ongoing                   Follow Up 10/19/22   - practice safe sex - schedule appointment for flu shot - schedule appointment for vaccines needed due to my age or health - schedule recommended health tests (blood work, mammogram, colonoscopy, pap test) - schedule and keep appointment for annual check-up    Why is this important?   Screening tests can find diseases early when they are easier to treat.  Your doctor or nurse will talk with you about which tests are important for you.  Getting shots  for common diseases like the flu and shingles will help prevent them.  09/14/22:  Patient seen and evaluated by PCP 08/25/22, [facility] 08/13/22   Follow Up:  Patient agrees to Care Plan and Follow-up.  Plan: The Managed Medicaid care management team will reach out to the patient again over the next 30 business  days. and The  Patient has been provided with contact information for the Managed Medicaid care management team and has been advised to call with any health related questions or concerns.  Date/time of next scheduled RN care management/care coordination outreach:  10/20/22 at 0900.

## 2022-09-14 NOTE — Patient Instructions (Signed)
Hi April [name], thank you for speaking with me, have a wonderful day and week.  April [name] was given information about Medicaid Managed Care team care coordination services as a part of their Healthy [facility] Medicaid benefit. April [name] verbally consented to engagement with the [facility] Managed Care team.   If you are experiencing a medical emergency, please call 911 or report to your local emergency department or urgent care.   If you have a non-emergency medical problem during routine business hours, please contact your provider's office and ask to speak with a nurse.   For questions related to your Healthy [facility] health plan, please call: [phone] or visit the homepage here: [email or web address]  If you would like to schedule transportation through your Healthy [facility] plan, please call the following number at least 2 days in advance of your appointment: [phone]  For information about your ride after you set it up, call Ride Assist at [phone]. Use this number to activate a Will Call pickup, or if your transportation is late for a scheduled pickup. Use this number, too, if you need to make a change or cancel a previously scheduled reservation.  If you need transportation services right away, call [phone]. The after-hours call center is staffed 24 hours to handle ride assistance and urgent reservation requests (including discharges) 365 days a year. Urgent trips include sick visits, hospital discharge requests and life-sustaining treatment.  Call the [facility] at [phone], at any time, 24 hours a day, 7 days a week. If you are in danger or need immediate medical attention call 911.  If you would like help to quit smoking, call 1-800-QUIT-NOW 573-174-6207) OR Espaol: 1-855-Djelo-Ya (8-786-767-2094) o para ms informacin haga clic aqu or Text READY to 200-400 to register via text  April [name] -  following are the goals we discussed in your visit today:   Goals Addressed    Timeframe:  Long-Range Goal Priority:  High Start Date:         05/22/22                    Expected End Date:  ongoing                   Follow Up 10/19/22   - practice safe sex - schedule appointment for flu shot - schedule appointment for vaccines needed due to my age or health - schedule recommended health tests (blood work, mammogram, colonoscopy, pap test) - schedule and keep appointment for annual check-up    Why is this important?   Screening tests can find diseases early when they are easier to treat.  Your doctor or nurse will talk with you about which tests are important for you.  Getting shots for common diseases like the flu and shingles will help prevent them.  09/14/22:  Patient seen and evaluated by PCP 08/25/22, [facility] 08/13/22  Patient verbalizes understanding of instructions and care plan provided today and agrees to view in [place]. Active MyChart status and patient understanding of how to access instructions and care plan via MyChart confirmed with patient.     The Managed Medicaid care management team will reach out to the patient again over the next 30 business  days.  The  Patient has been provided with contact information for the Managed Medicaid care management team and has been advised to call with any health related questions or concerns.   [name] RN, [place]  [facility]ator - Managed Medicaid High Risk [phone]   Following is a copy of your plan of care:  Care Plan : [place] of Care  Updates made by [name], RN since 09/14/2022 12:00 AM     Problem: Health Promotion or Disease Self-Management (General Plan of Care)      Long-Range Goal: Chronic Disease Management and Care Coordination Needs/SDOH   Start Date: 05/22/2022  Expected End Date: 11/05/2022  Priority: High  Note:    Current Barriers:  Care Coordination needs related to Limited access to food resources and utility resources-completed. Chronic Disease Management HTN, GERD, osteoarthritis, low back pain, obesity, anxiety/depression, delusional disorder. 09/13/22:  No complaints today, steroid injection left knee helpful.  BP stable.  Continues to see [facility].  RNCM Clinical Goal(s):  Patient will take all medications exactly as prescribed and will call provider for medication related questions as evidenced by patient report demonstrate understanding of rationale for each prescribed medication as evidenced by patient report attend all scheduled medical appointments as evidenced by patient report continue to work with RN Care Manager to address care management and care coordination needs related to SDOH. HTN, GERD, osteoarthritis, LBP, obesity, anxiety/depression as evidenced by adherence to CM Team Scheduled appointments work with [employer] care guide to address needs related to  Limited access to food and utility resources as evidenced by patient and/or community resource care guide support through collaboration with Consulting civil engineer, provider, and care team.   Interventions: Inter-disciplinary care team collaboration (see longitudinal plan of care) Evaluation of current treatment plan related to  self management and patient's adherence to plan as established by provider Care Guide referral for utility resources and food resources-completed. Collaborated with Care Guide for food and utility resources  Hypertension Interventions:  (Status:  New goal.) Long Term Goal Last practice recorded BP readings:  BP Readings from Last 3 Encounters:  09/08/22 119/69  08/25/22 119/83  08/13/22 118/75  Most recent eGFR/CrCl:  Lab Results  Component Value Date   EGFR 81 07/14/2022    No components found for: "CRCL"  Evaluation of current treatment plan related to hypertension self  management and patient's adherence to plan as established by provider Reviewed medications with patient and discussed importance of compliance Discussed plans with patient for ongoing care management follow up and provided patient with direct contact information for care management team Advised patient, providing education and rationale, to monitor blood pressure daily and record, calling PCP for findings outside established parameters Reviewed scheduled/upcoming provider appointments  Assessed social determinant of health barriers  SDOH Barriers (Status:  New goal.) Long Term Goal Patient interviewed and SDOH assessment performed        SDOH Interventions    Flowsheet Row Most Recent Value  SDOH Interventions   Food Insecurity Interventions Other (Comment)  [Care Guide referral]  Transportation Interventions Intervention Not Indicated     Patient interviewed and appropriate assessments performed Referred patient to community resources care guide team for assistance with food and utility resources. Discussed plans with patient for ongoing care management follow up and provided patient with direct contact information for care management team  Patient Goals/Self-Care Activities: Take all medications as prescribed Attend all scheduled provider appointments Call pharmacy for medication refills 3-7 days in advance of running out of medications Perform all self care activities independently  Perform IADL's (shopping, preparing meals, housekeeping, managing finances) independently Call provider office for new concerns or questions  Patient will contact [employer] and [employer]  Follow Up Plan:  The patient has been provided with contact information for the care management team and has been advised to call with any health related questions or concerns.  The care management team will reach out to the patient again over the next 30 business  days.

## 2022-09-17 NOTE — Progress Notes (Signed)
Chief Complaint:   OBESITY April [name] is here to discuss her progress with her obesity treatment plan along with follow-up of her obesity related diagnoses. April [name] is on keeping a food journal and adhering to recommended goals of 1200 calories and 75 protein and states she is following her eating plan approximately 85% of the time. April [name] states she is some walking 60-120 minutes 2 times per week.  Today's visit was #: 64 Starting weight: 256 lbs Starting date: 06/13/2019 Today's weight: 226 lbs Today's date: 09/08/2022 Total lbs lost to date: 30 lbs Total lbs lost since last in-office visit: 0  Interim History: Has cut back on higher fat meats.  She feels better physically eating more chicken and [place].  She is still struggling to get in enough fruits and veggies.  Limited quantities of sweets at Thanksgiving.  She is working to increase walking.   Subjective:   1. Polyphagia Topamax dose increased to 50 mg BID.  Had GI side effects and paresthesias.  Denies side effects from 25 mg BID dose.  Not seeing as much food impulse control.    2. Vitamin D deficiency Changed Vitamin D to every 14 days.   3. Essential hypertension Blood pressure is well-controlled.  She is taking HCTZ 25 mg daily.  She denies chest pain today.  Assessment/Plan:   1. Polyphagia Reduce - topiramate (TOPAMAX) 25 MG tablet; Take 1 tablet (25 mg total) by mouth 2 (two) times daily.  Dispense: 60 tablet; Refill: 0  2. Vitamin D deficiency Recheck Vitamin D level in 3 months.   Refill - Vitamin D, Ergocalciferol, (DRISDOL) 1.25 MG (50000 UNIT) CAPS capsule; One Capsule by mouth every 14 days  Dispense: 4 capsule; Refill: 0  3. Essential hypertension Continue HCTZ 25 mg daily.  4. Obesity,current BMI 44.2 1) Reviewed overall progress:  she is down 30 lbs in 3 years.   2) Plan to increase time at the [facility] to 3 times a per week.   April [name] is currently in the action stage of change. As such, her  goal is to continue with weight loss efforts. She has agreed to keeping a food journal and adhering to recommended goals of 1200 calories and 75 protein daily.   Exercise goals:  As is.   Behavioral modification strategies: increasing lean protein intake, increasing water intake, decreasing eating out, meal planning and cooking strategies, keeping healthy foods in the home, and planning for success.  April [name] has agreed to follow-up with our clinic in 4 weeks. She was informed of the importance of frequent follow-up visits to maximize her success with intensive lifestyle modifications for her multiple health conditions.   Objective:   Blood pressure 119/69, pulse 65, temperature 98.4 F (36.9 C), height 5' (1.524 m), weight 226 lb (102.5 kg), SpO2 94 %. Body mass index is 44.14 kg/m.  General: Cooperative, alert, well developed, in no acute distress. HEENT: Conjunctivae and lids unremarkable. Cardiovascular: Regular rhythm.  Lungs: Normal work of breathing. Neurologic: No focal deficits.   Lab Results  Component Value Date   CREATININE 0.83 07/14/2022   BUN 12 07/14/2022   NA 141 07/14/2022   K 4.9 07/14/2022   CL 102 07/14/2022   CO2 24 07/14/2022   Lab Results  Component Value Date   ALT 14 07/14/2022   AST 18 07/14/2022   ALKPHOS 67 07/14/2022   BILITOT 0.3 07/14/2022   Lab Results  Component Value Date   HGBA1C 5.5 03/13/2022   HGBA1C 5.6 11/06/2021  HGBA1C 5.5 04/29/2021   HGBA1C 7.3 (H) 04/07/2021   HGBA1C 5.3 11/06/2020   Lab Results  Component Value Date   INSULIN 7.3 11/06/2021   INSULIN 3.6 04/07/2021   INSULIN 13.8 11/06/2020   INSULIN 11.6 07/10/2020   INSULIN 7.5 03/20/2020   Lab Results  Component Value Date   TSH 0.949 03/13/2022   Lab Results  Component Value Date   CHOL 198 03/13/2022   HDL 52 03/13/2022   LDLCALC 129 (H) 03/13/2022   LDLDIRECT 153 (H) 08/16/2020   TRIG 87 03/13/2022   CHOLHDL 3.8 03/13/2022   Lab Results  Component  Value Date   VD25OH 79.9 11/06/2021   VD25OH 65.2 04/07/2021   VD25OH 61.9 11/06/2020   Lab Results  Component Value Date   WBC 8.1 03/13/2022   HGB 14.0 03/13/2022   HCT 44.0 03/13/2022   MCV 89.8 03/13/2022   PLT 296 03/13/2022   Lab Results  Component Value Date   IRON 36 (L) 12/11/2009   TIBC 318 12/11/2009   FERRITIN 19 12/11/2009   Attestation Statements:   Reviewed by clinician on day of visit: allergies, medications, problem list, medical history, surgical history, family history, social history, and previous encounter notes.  I, Davy Pique, am acting as Location manager for Loyal Gambler, DO.  I have reviewed the above documentation for accuracy and completeness, and I agree with the above. Dell Ponto, DO

## 2022-09-29 ENCOUNTER — Encounter (INDEPENDENT_AMBULATORY_CARE_PROVIDER_SITE_OTHER): Payer: Self-pay | Admitting: Family Medicine

## 2022-09-29 ENCOUNTER — Ambulatory Visit (INDEPENDENT_AMBULATORY_CARE_PROVIDER_SITE_OTHER): Payer: Medicaid Other | Admitting: Family Medicine

## 2022-09-29 VITALS — BP 119/78 | HR 66 | Temp 98.2°F | Ht 60.0 in | Wt 223.0 lb

## 2022-09-29 DIAGNOSIS — E559 Vitamin D deficiency, unspecified: Secondary | ICD-10-CM | POA: Diagnosis not present

## 2022-09-29 DIAGNOSIS — Z6841 Body Mass Index (BMI) 40.0 and over, adult: Secondary | ICD-10-CM

## 2022-09-29 DIAGNOSIS — I10 Essential (primary) hypertension: Secondary | ICD-10-CM | POA: Diagnosis not present

## 2022-09-29 DIAGNOSIS — R632 Polyphagia: Secondary | ICD-10-CM

## 2022-09-29 DIAGNOSIS — E669 Obesity, unspecified: Secondary | ICD-10-CM | POA: Diagnosis not present

## 2022-09-29 MED ORDER — TOPIRAMATE 25 MG PO TABS: 25.0000 mg | ORAL_TABLET | Freq: Two times a day (BID) | ORAL | 0 refills | Status: DC

## 2022-10-01 ENCOUNTER — Other Ambulatory Visit (INDEPENDENT_AMBULATORY_CARE_PROVIDER_SITE_OTHER): Payer: Self-pay | Admitting: Family Medicine

## 2022-10-01 DIAGNOSIS — R632 Polyphagia: Secondary | ICD-10-CM

## 2022-10-06 NOTE — Progress Notes (Signed)
Chief Complaint:   OBESITY April [name] is here to discuss her progress with her obesity treatment plan along with follow-up of her obesity related diagnoses. April [name] is on keeping a food journal and adhering to recommended goals of 1200 calories and 75 protein and states she is following her eating plan approximately 80% of the time. April [name] states she is -not doing much at this time, [employer] 20-30 minutes 2 times per week.  Today's visit was #: 70 Starting weight: 256 lbs Starting date: 06/13/2019 Today's weight: 223 lbs Today's date: 09/29/2022 Total lbs lost to date: 33 lbs Total lbs lost since last in-office visit: 3 lbs  Interim History: Patient has had a cold since last visit.  Exercising less.  Eating moreturkey and listening to full and hunger cues.  Eating out less.  Needs more fruits, and veggies and.  Plans to get back to the [facility].  Subjective:   1. Polyphagia Improving with increased protein intake.  Reduced Topamax to 25 mg twice daily last visit, denies adverse side effects.  2. Vitamin D deficiency Patient is taking vitamin D every 14 days.  Fatigue has worsened by recent URI.  3. Essential hypertension Patient is on HCTZ 25 mg daily.  Her blood pressure is well-controlled.  She denies chest pain  Assessment/Plan:   1. Polyphagia Increase water intake for kidney stone prevention. Hunger has improved on Topamax.  Refill- topiramate (TOPAMAX) 25 MG tablet; Take 1 tablet (25 mg total) by mouth 2 (two) times daily.  Dispense: 60 tablet; Refill: 0  2. Vitamin D deficiency Recheck vitamin D level in 2 months.  Continue RX vitamin D 50,0000 IU q 14 days.  3. Essential hypertension Continue HCTZ 25 mg daily.  Reduce intake of sodium.  4. Obesity,current BMI 43.7 1.  Reviewed bioimpedance. 2.  Not increase water intake. 3.  Increase 1 LB muscle mass, decrease 3.6 pounds body fat in 3 weeks.  April [name] is currently in the action stage of change. As such, her goal  is to continue with weight loss efforts. She has agreed to the Category 2 Plan.   Exercise goals:  Walk, gym 3 times a week.  Behavioral modification strategies: increasing lean protein intake, increasing vegetables, increasing water intake, decreasing eating out, no skipping meals, meal planning and cooking strategies, and holiday eating strategies .  April [name] has agreed to follow-up with our clinic in 4 weeks. She was informed of the importance of frequent follow-up visits to maximize her success with intensive lifestyle modifications for her multiple health conditions.   Objective:   Blood pressure 119/78, pulse 66, temperature 98.2 F (36.8 C), height 5' (1.524 m), weight 223 lb (101.2 kg), SpO2 99 %. Body mass index is 43.55 kg/m.  General: Cooperative, alert, well developed, in no acute distress. HEENT: Conjunctivae and lids unremarkable. Cardiovascular: Regular rhythm.  Lungs: Normal work of breathing. Neurologic: No focal deficits.   Lab Results  Component Value Date   CREATININE 0.83 07/14/2022   BUN 12 07/14/2022   NA 141 07/14/2022   K 4.9 07/14/2022   CL 102 07/14/2022   CO2 24 07/14/2022   Lab Results  Component Value Date   ALT 14 07/14/2022   AST 18 07/14/2022   ALKPHOS 67 07/14/2022   BILITOT 0.3 07/14/2022   Lab Results  Component Value Date   HGBA1C 5.5 03/13/2022   HGBA1C 5.6 11/06/2021   HGBA1C 5.5 04/29/2021   HGBA1C 7.3 (H) 04/07/2021   HGBA1C 5.3 11/06/2020   Lab  Results  Component Value Date   INSULIN 7.3 11/06/2021   INSULIN 3.6 04/07/2021   INSULIN 13.8 11/06/2020   INSULIN 11.6 07/10/2020   INSULIN 7.5 03/20/2020   Lab Results  Component Value Date   TSH 0.949 03/13/2022   Lab Results  Component Value Date   CHOL 198 03/13/2022   HDL 52 03/13/2022   LDLCALC 129 (H) 03/13/2022   LDLDIRECT 153 (H) 08/16/2020   TRIG 87 03/13/2022   CHOLHDL 3.8 03/13/2022   Lab Results  Component Value Date   VD25OH 79.9 11/06/2021   VD25OH  65.2 04/07/2021   VD25OH 61.9 11/06/2020   Lab Results  Component Value Date   WBC 8.1 03/13/2022   HGB 14.0 03/13/2022   HCT 44.0 03/13/2022   MCV 89.8 03/13/2022   PLT 296 03/13/2022   Lab Results  Component Value Date   IRON 36 (L) 12/11/2009   TIBC 318 12/11/2009   FERRITIN 19 12/11/2009   Attestation Statements:   Reviewed by clinician on day of visit: allergies, medications, problem list, medical history, surgical history, family history, social history, and previous encounter notes.  I, Davy Pique, am acting as Location manager for Loyal Gambler, DO.  I have reviewed the above documentation for accuracy and completeness, and I agree with the above. Dell Ponto, DO

## 2022-10-07 ENCOUNTER — Encounter: Payer: Self-pay | Admitting: Family Medicine

## 2022-10-07 ENCOUNTER — Ambulatory Visit: Payer: Medicaid Other | Admitting: Family Medicine

## 2022-10-07 VITALS — BP 117/79 | HR 80 | Ht 60.0 in | Wt 225.1 lb

## 2022-10-07 DIAGNOSIS — M25562 Pain in left knee: Secondary | ICD-10-CM | POA: Diagnosis not present

## 2022-10-07 DIAGNOSIS — I10 Essential (primary) hypertension: Secondary | ICD-10-CM | POA: Diagnosis not present

## 2022-10-07 DIAGNOSIS — G8929 Other chronic pain: Secondary | ICD-10-CM

## 2022-10-07 NOTE — Patient Instructions (Signed)
It was great seeing you today!  Today we discussed many things. I will prescribe the tramadol at the next refill, please try to stay active as well. Your blood pressure looks great!   Please follow up at your next scheduled appointment in 3 months, if anything arises between now and then, please don't hesitate to contact our office.   Thank you for allowing [place] to be a part of your medical care!  Thank you, Dr. Larae Grooms  Also a reminder of our clinic's no-show policy. Please make sure to arrive at least 15 minutes prior to your scheduled appointment time. Please try to cancel before 24 hours if you are not able to make it. If you no-show for 2 appointments then you will be receiving a warning letter. If you no-show after 3 visits, then you may be at risk of being dismissed from our clinic. This is to ensure that everyone is able to be seen in a timely manner. Thank you, we appreciate your assistance with this!

## 2022-10-07 NOTE — Progress Notes (Unsigned)
    SUBJECTIVE:   CHIEF COMPLAINT / HPI:   Patient presents for follow up, she is requesting a handicap parking pass renewal. She has trouble walking longer distances especially at [place] when she gets shortness of breath after walking 2-3 minutes. I encouraged her to stay active and she agrees to continue to do this. Follows with ortho as well who prescribing the tramadol briefly but it seems that they are not able to consistently prescribe this. Tramadol has improved her knee pain, it allows her to be more active which has allowed her to lose weight and be a good participant at healthy [facility]. Sees ortho every 3 months and the new doctor will not be prescribed tramadol.   PERTINENT  PMH / PSH:   Hypertension  Reports compliance with HCTZ daily. Denies chest pain or leg swelling. Gets dyspnea occasionally with a lot of activity. Has been eating a balanced and healthy diet along with staying physically active. Having tramadol allows her to maintain her healthy, active lifestyle. She participates in healthy [facility], she is now at 90% of her plan. Has been losing weight and staying active.   OBJECTIVE:   BP 117/79   Pulse 80   Ht 5' (1.524 m)   Wt 225 lb 2 oz (102.1 kg)   SpO2 99%   BMI 43.97 kg/m   General: Patient well-appearing, in no acute distress. CV: RRR, no murmurs or gallops auscultated Resp: CTAB, no wheezing, rales or rhonchi noted HGD:JMEQ active ROM along both knees, no gross deformity or tenderness upon palpation, no crepitus, edema or erythema noted bilaterally  Neuro: 5/5 LE strength bilaterally, gross sensation intact, normal gait   ASSESSMENT/PLAN:   Essential hypertension -BP 117/79, at goal -continue HCTZ, no changes to antihypertensive regimen -recent [facility] Oct 2023 demonstrated appropriate electrolyte levels and renal function  -diet and exercise counseling provided   Left knee pain -I agreed to prescribe tramdol for patient as this  clearly improves her quality of life and health status allowing her to be more active and lose weight. I want her to continue these goals. We discussed that I want to lower her dose from tramadol 50 mg bid to a total daily 50 mg dose. I told her I am open to 25 bid or 50 daily and she agrees to the 50 daily.  -plan to refill tramadol with 50 mg daily at time of refill -agreed to temporary handicap parking pass but encouraged her to still remain active and try to park further away on days that her knee pain improves to motivate more physical activity, she agrees -continue ortho follow up every 3 months as appropriate     Lexie Koehl Larae Grooms, [place]

## 2022-10-08 NOTE — Assessment & Plan Note (Signed)
-  BP 117/79, at goal -continue HCTZ, no changes to antihypertensive regimen -recent [facility] Oct 2023 demonstrated appropriate electrolyte levels and renal function  -diet and exercise counseling provided

## 2022-10-08 NOTE — Assessment & Plan Note (Addendum)
-  I agreed to prescribe tramdol for patient as this clearly improves her quality of life and health status allowing her to be more active and lose weight. I want her to continue these goals. We discussed that I want to lower her dose from tramadol 50 mg bid to a total daily 50 mg dose. I told her I am open to 25 bid or 50 daily and she agrees to the 50 daily.  -plan to refill tramadol with 50 mg daily at time of refill -agreed to temporary handicap parking pass but encouraged her to still remain active and try to park further away on days that her knee pain improves to motivate more physical activity, she agrees -continue ortho follow up every 3 months as appropriate

## 2022-10-09 ENCOUNTER — Other Ambulatory Visit: Payer: Self-pay

## 2022-10-09 MED ORDER — TRAMADOL HCL 50 MG PO TABS: 50.0000 mg | ORAL_TABLET | Freq: Every day | ORAL | 0 refills | Status: DC

## 2022-10-14 ENCOUNTER — Ambulatory Visit
Admission: RE | Admit: 2022-10-14 | Discharge: 2022-10-14 | Disposition: A | Discharge status: Home / Self Care | Payer: Medicaid Other | Source: Ambulatory Visit | Attending: Family Medicine | Admitting: Family Medicine

## 2022-10-14 DIAGNOSIS — Z1231 Encounter for screening mammogram for malignant neoplasm of breast: Secondary | ICD-10-CM

## 2022-10-19 ENCOUNTER — Other Ambulatory Visit: Payer: Medicaid Other | Admitting: Obstetrics and Gynecology

## 2022-10-19 ENCOUNTER — Encounter: Payer: Self-pay | Admitting: Obstetrics and Gynecology

## 2022-10-19 NOTE — Patient Outreach (Signed)
Medicaid Managed Care   Nurse Care Manager Note  10/19/2022 Name:  April [name] MRN:  [id number] DOB:  [birth year]  April [name] is an 62 y.o. year old female who is a primary patient of [place], [place], DO.  The [facility] Managed Care Coordination team was consulted for assistance with:    Chronic healthcare management needs, HTN, GERD, osteoarthritis, anxiety/depression  April [name] was given information about Medicaid Managed Care Coordination team services today. April [name] Patient agreed to services and verbal consent obtained.  Engaged with patient by telephone for follow up visit in response to provider referral for case management and/or care coordination services.   Assessments/Interventions:  Review of past medical history, allergies, medications, health status, including review of consultants reports, laboratory and other test data, was performed as part of comprehensive evaluation and provision of chronic care management services.  SDOH (Social Determinants of Health) assessments and interventions performed: SDOH Interventions    Flowsheet Row Patient Outreach Telephone from 10/19/2022 in [place] Patient Outreach Telephone from 09/14/2022 in [place] Patient Outreach Telephone from 08/05/2022 in [place] Patient Outreach Telephone from 07/02/2022 in [place] Coordination Telephone from 05/25/2022 in [place] Patient Outreach Telephone from 05/22/2022 in [place] Interventions        Food Insecurity Interventions -- -- -- -- Other (Comment)  [Mailed food pantry list] Other (Comment)  [Care Guide referral]  Housing Interventions -- Intervention Not Indicated -- -- -- --  Transportation Interventions Intervention Not Indicated -- -- -- --  Intervention Not Indicated  Utilities Interventions -- -- -- Ambulatory [id number] Order  [resources received in August] -- --  Alcohol Usage Interventions -- -- -- Intervention Not Indicated (Score <7) -- --  Financial Strain Interventions -- -- -- -- Other (Comment)  [mailed resources for utility assistance.] --  Physical Activity Interventions Intervention Not Indicated -- -- -- -- --  Stress Interventions -- -- Provide Counseling  [sees therapist] -- -- --     Care Plan  Allergies  Allergen Reactions   Percocet [Oxycodone-Acetaminophen] Swelling   Naproxen Palpitations    Medications Reviewed Today     Reviewed by [name], RN (Registered Nurse) on 10/19/22 at [phone]  Med List Status: <None>   Medication Order Taking? Sig Documenting Provider Last Dose Status Informant  benztropine (COGENTIN) 1 MG tablet [id number] No Take 1 tablet (1 mg total) by mouth daily. Freida Busman, MD Taking Active   cetirizine (ZYRTEC) 10 MG tablet [id number] No Take 1 tablet (10 mg total) by mouth daily. Talbot Grumbling, FNP Taking Active   diclofenac Sodium (VOLTAREN) 1 % GEL [id number] No APPLY 2 GRAMS TOPICALLY TO THE AFFECTED AREA FOUR TIMES DAILY  Patient taking differently: 2 g 4 (four) times daily as needed (Apply to affected area for pain).   Donney Dice, DO Taking Active Self  haloperidol (HALDOL) 1 MG tablet [id number] No Take 1 tablet (1 mg total) by mouth 2 (two) times daily. Freida Busman, MD Taking Active   hydrochlorothiazide (HYDRODIURIL) 25 MG tablet [id number] No Take 1 tablet (25 mg total) by mouth daily. Bowen, Collene Leyden, DO Taking Active   topiramate (TOPAMAX) 25 MG tablet [id number] No Take 1 tablet (25 mg total) by mouth 2 (two) times daily. Bowen, Collene Leyden, DO Taking Active   traMADol (ULTRAM) 50 MG tablet [id number]  Take 1 tablet (50 mg total) by mouth daily. Donney Dice, DO  Active   Vitamin D, Ergocalciferol, (DRISDOL) 1.25 MG (50000 UNIT) CAPS capsule [id number] No One  Capsule by mouth every 14 days Dell Ponto, DO Taking Active            Patient Active Problem List   Diagnosis Date Noted   Left knee pain 08/25/2022   Polyphagia 08/13/2022   Callus 07/23/2022   Benign nevus of plantar aspect of foot 06/29/2022   Delusional disorder ([place]) 03/14/2022   Pap smear for cervical cancer screening 09/16/2021   Tension headache 01/01/2021   Essential hypertension 08/15/2020   Insulin resistance 07/11/2019   Class 3 severe obesity with serious comorbidity and body mass index (BMI) of 50.0 to 59.9 in adult ([place]) 07/11/2019   Vitamin D deficiency 03/15/2019   Vitiligo 03/03/2018   Insomnia 11/05/2017   Fatigue 03/02/2016   Osteoarthritis of knees, bilateral 09/23/2015   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   Low back pain 01/27/2012   GERD 03/22/2009   Anxiety state 11/14/2008   Eczema 05/18/2008   Depression 03/10/2007   HYPERCHOLESTEROLEMIA 12/09/2006   Anemia 12/09/2006   Conditions to be addressed/monitored per PCP order:  Chronic healthcare management needs, HTN, GERD, osteoarthritis, anxiety/depression, knee pain  Care Plan : RN Care Manager Plan of Care  Updates made by [name], RN since 10/19/2022 12:00 AM     Problem: Health Promotion or Disease Self-Management (General Plan of Care)      Long-Range Goal: Chronic Disease Management and Care Coordination Needs/SDOH   Start Date: 05/22/2022  Expected End Date: 01/18/2023  Priority: High  Note:   Current Barriers:  Care Coordination needs related to Limited access to food resources and utility resources-completed. Chronic Disease Management HTN, GERD, osteoarthritis, low back pain, obesity, anxiety/depression, delusional disorder. 10/19/22:  Patient doing well today, no complaints or needs identified.  Continues monthly visits to [facility].  Left knee pain improved with help of Tramadol and exercises.  [facility] which is helpful.  Weight decreased to  223.  BP stable.  RNCM Clinical Goal(s):  Patient will take all medications exactly as prescribed and will call provider for medication related questions as evidenced by patient report demonstrate understanding of rationale for each prescribed medication as evidenced by patient report attend all scheduled medical appointments as evidenced by patient report continue to work with RN Care Manager to address care management and care coordination needs related to SDOH. HTN, GERD, osteoarthritis, LBP, obesity, anxiety/depression as evidenced by adherence to CM Team Scheduled appointments work with [employer] care guide to address needs related to  Limited access to food and utility resources as evidenced by patient and/or community resource care guide support through collaboration with Consulting civil engineer, provider, and care team.   Interventions: Inter-disciplinary care team collaboration (see longitudinal plan of care) Evaluation of current treatment plan related to  self management and patient's adherence to plan as established by provider Care Guide referral for utility resources and food resources-completed. Collaborated with Care Guide for food and utility resources  Hypertension Interventions:  (Status:  New goal.) Long Term Goal Last practice recorded BP readings:  BP Readings from Last 3 Encounters:  09/08/22 119/69  08/25/22 119/83  08/13/22 118/75  10/08/22          117/91 Most recent eGFR/CrCl:  Lab Results  Component Value Date   EGFR 81 07/14/2022    No  components found for: "CRCL"  Evaluation of current treatment plan related to hypertension self management and patient's adherence to plan as established by provider Reviewed medications with patient and discussed importance of compliance Discussed plans with patient for ongoing care management follow up and provided patient with direct contact information for care management team Advised patient, providing education and  rationale, to monitor blood pressure daily and record, calling PCP for findings outside established parameters Reviewed scheduled/upcoming provider appointments  Assessed social determinant of health barriers  SDOH Barriers (Status:  New goal.) Long Term Goal Patient interviewed and SDOH assessment performed        SDOH Interventions    Flowsheet Row Most Recent Value  SDOH Interventions   Food Insecurity Interventions Other (Comment)  [Care Guide referral]  Transportation Interventions Intervention Not Indicated     Patient interviewed and appropriate assessments performed Referred patient to community resources care guide team for assistance with food and utility resources. Discussed plans with patient for ongoing care management follow up and provided patient with direct contact information for care management team  Patient Goals/Self-Care Activities: Take all medications as prescribed Attend all scheduled provider appointments Call pharmacy for medication refills 3-7 days in advance of running out of medications Perform all self care activities independently  Perform IADL's (shopping, preparing meals, housekeeping, managing finances) independently Call provider office for new concerns or questions  Patient will contact [employer] and [employer]  Follow Up Plan:  The patient has been provided with contact information for the care management team and has been advised to call with any health related questions or concerns.  The care management team will reach out to the patient again over the next 30 business  days.   Long-Range Goal: Establish Plan of Care for Chronic Disease Management Needs/SDOH   Priority: High  Note:   Timeframe:  Long-Range Goal Priority:  High Start Date:         05/22/22                    Expected End Date:  ongoing                   Follow Up 11/19/22   - practice safe sex - schedule appointment for flu shot - schedule appointment for vaccines  needed due to my age or health - schedule recommended health tests (blood work, mammogram, colonoscopy, pap test) - schedule and keep appointment for annual check-up    Why is this important?   Screening tests can find diseases early when they are easier to treat.  Your doctor or nurse will talk with you about which tests are important for you.  Getting shots for common diseases like the flu and shingles will help prevent them.  10/19/22:  Patient seen and evaluated by PCP 10/08/22, [facility] 09/29/22, has f/u appts scheduled.   Follow Up:  Patient agrees to Care Plan and Follow-up.  Plan: The Managed Medicaid care management team will reach out to the patient again over the next 30 business  days. and The  Patient has been provided with contact information for the Managed Medicaid care management team and has been advised to call with any health related questions or concerns.  Date/time of next scheduled RN care management/care coordination outreach:  11/13/22 at 1030.

## 2022-10-19 NOTE — Patient Instructions (Signed)
Hi Ms. Cale, nice to speak with you this morning, have a wonderful day and week!  Ms. Abadi was given information about Medicaid Managed Care team care coordination services as a part of their [facility] Medicaid benefit. [name] verbally consented to engagement with the [facility] Managed Care team.   If you are experiencing a medical emergency, please call 911 or report to your local emergency department or urgent care.   If you have a non-emergency medical problem during routine business hours, please contact your provider's office and ask to speak with a nurse.   For questions related to your Healthy [facility] health plan, please call: [phone] or visit the homepage here: [email or web address]  If you would like to schedule transportation through your [facility] plan, please call the following number at least 2 days in advance of your appointment: [phone]  For information about your ride after you set it up, call Ride Assist at [phone]. Use this number to activate a Will Call pickup, or if your transportation is late for a scheduled pickup. Use this number, too, if you need to make a change or cancel a previously scheduled reservation.  If you need transportation services right away, call [phone]. The after-hours call center is staffed 24 hours to handle ride assistance and urgent reservation requests (including discharges) 365 days a year. Urgent trips include sick visits, hospital discharge requests and life-sustaining treatment.  Call the [place] at [phone], at any time, 24 hours a day, 7 days a week. If you are in danger or need immediate medical attention call 911.  If you would like help to quit smoking, call 1-800-QUIT-NOW 8432435643) OR Espaol: 1-855-Djelo-Ya (4-854-627-0350) o para ms informacin haga clic aqu or Text READY to 200-400 to register via text  Ms.  [place] - following are the goals we discussed in your visit today:   Goals Addressed    Timeframe:  Long-Range Goal Priority:  High Start Date:         05/22/22                    Expected End Date:  ongoing                   Follow Up 11/19/22   - practice safe sex - schedule appointment for flu shot - schedule appointment for vaccines needed due to my age or health - schedule recommended health tests (blood work, mammogram, colonoscopy, pap test) - schedule and keep appointment for annual check-up    Why is this important?   Screening tests can find diseases early when they are easier to treat.  Your doctor or nurse will talk with you about which tests are important for you.  Getting shots for common diseases like the flu and shingles will help prevent them.  10/19/22:  Patient seen and evaluated by PCP 10/08/22, [facility] 09/29/22, has f/u appts scheduled.  Patient verbalizes understanding of instructions and care plan provided today and agrees to view in [place]. Active MyChart status and patient understanding of how to access instructions and care plan via MyChart confirmed with patient.     The Managed Medicaid care management team will reach out to the patient again over the next 30 business  days.  The  Patient  has been provided with contact information for the Managed Medicaid care management team and has been advised to call with any health related questions or concerns.  [name] RN, BSN [place] Management Coordinator - Managed Medicaid High Risk [phone]   Following is a copy of your plan of care:  Care Plan : [place] of Care  Updates made by [name], RN since 10/19/2022 12:00 AM     Problem: Health Promotion or Disease Self-Management (General Plan of Care)      Long-Range Goal: Chronic Disease Management and Care Coordination Needs/SDOH   Start Date: 05/22/2022  Expected End Date:  01/18/2023  Priority: High  Note:   Current Barriers:  Care Coordination needs related to Limited access to food resources and utility resources-completed. Chronic Disease Management HTN, GERD, osteoarthritis, low back pain, obesity, anxiety/depression, delusional disorder. 10/19/22:  Patient doing well today, no complaints or needs identified.  Continues monthly visits to health [facility].  Left knee pain improved with help of Tramadol and exercises.  [facility] which is helpful.  Weight decreased to 223.  BP stable.  RNCM Clinical Goal(s):  Patient will take all medications exactly as prescribed and will call provider for medication related questions as evidenced by patient report demonstrate understanding of rationale for each prescribed medication as evidenced by patient report attend all scheduled medical appointments as evidenced by patient report continue to work with RN Care Manager to address care management and care coordination needs related to SDOH. HTN, GERD, osteoarthritis, LBP, obesity, anxiety/depression as evidenced by adherence to CM Team Scheduled appointments work with [employer] care guide to address needs related to  Limited access to food and utility resources as evidenced by patient and/or community resource care guide support through collaboration with Consulting civil engineer, provider, and care team.   Interventions: Inter-disciplinary care team collaboration (see longitudinal plan of care) Evaluation of current treatment plan related to  self management and patient's adherence to plan as established by provider Care Guide referral for utility resources and food resources-completed. Collaborated with Care Guide for food and utility resources  Hypertension Interventions:  (Status:  New goal.) Long Term Goal Last practice recorded BP readings:  BP Readings from Last 3 Encounters:  09/08/22 119/69  08/25/22 119/83  08/13/22 118/75  10/08/22           117/91 Most recent eGFR/CrCl:  Lab Results  Component Value Date   EGFR 81 07/14/2022    No components found for: "CRCL"  Evaluation of current treatment plan related to hypertension self management and patient's adherence to plan as established by provider Reviewed medications with patient and discussed importance of compliance Discussed plans with patient for ongoing care management follow up and provided patient with direct contact information for care management team Advised patient, providing education and rationale, to monitor blood pressure daily and record, calling PCP for findings outside established parameters Reviewed scheduled/upcoming provider appointments  Assessed social determinant of health barriers  SDOH Barriers (Status:  New goal.) Long Term Goal Patient interviewed and SDOH assessment performed        SDOH Interventions    Flowsheet Row Most Recent Value  SDOH Interventions   Food Insecurity Interventions Other (Comment)  [Care Guide referral]  Transportation Interventions Intervention Not Indicated     Patient interviewed and appropriate assessments performed Referred patient to community resources care guide team for assistance with food and utility resources. Discussed plans with patient for ongoing care management follow up and provided patient with direct contact information for care management team  Patient Goals/Self-Care Activities: Take all  medications as prescribed Attend all scheduled provider appointments Call pharmacy for medication refills 3-7 days in advance of running out of medications Perform all self care activities independently  Perform IADL's (shopping, preparing meals, housekeeping, managing finances) independently Call provider office for new concerns or questions  Patient will contact [employer] and [employer]  Follow Up Plan:  The patient has been provided with contact information for the care management team and has been advised  to call with any health related questions or concerns.  The care management team will reach out to the patient again over the next 30 business  days.

## 2022-11-02 ENCOUNTER — Other Ambulatory Visit (INDEPENDENT_AMBULATORY_CARE_PROVIDER_SITE_OTHER): Payer: Self-pay | Admitting: Family Medicine

## 2022-11-02 ENCOUNTER — Encounter (INDEPENDENT_AMBULATORY_CARE_PROVIDER_SITE_OTHER): Payer: Self-pay | Admitting: Family Medicine

## 2022-11-02 ENCOUNTER — Ambulatory Visit (INDEPENDENT_AMBULATORY_CARE_PROVIDER_SITE_OTHER): Payer: Medicaid Other | Admitting: Family Medicine

## 2022-11-02 VITALS — BP 109/74 | HR 80 | Temp 98.1°F | Ht 60.0 in | Wt 220.0 lb

## 2022-11-02 DIAGNOSIS — E669 Obesity, unspecified: Secondary | ICD-10-CM | POA: Diagnosis not present

## 2022-11-02 DIAGNOSIS — M1712 Unilateral primary osteoarthritis, left knee: Secondary | ICD-10-CM | POA: Insufficient documentation

## 2022-11-02 DIAGNOSIS — R632 Polyphagia: Secondary | ICD-10-CM

## 2022-11-02 DIAGNOSIS — Z6841 Body Mass Index (BMI) 40.0 and over, adult: Secondary | ICD-10-CM

## 2022-11-02 DIAGNOSIS — E559 Vitamin D deficiency, unspecified: Secondary | ICD-10-CM | POA: Diagnosis not present

## 2022-11-02 MED ORDER — VITAMIN D (ERGOCALCIFEROL) 1.25 MG (50000 UNIT) PO CAPS: ORAL_CAPSULE | ORAL | 0 refills | Status: DC

## 2022-11-02 MED ORDER — TOPIRAMATE 25 MG PO TABS: 25.0000 mg | ORAL_TABLET | Freq: Two times a day (BID) | ORAL | 1 refills | Status: DC

## 2022-11-05 DIAGNOSIS — F418 Other specified anxiety disorders: Secondary | ICD-10-CM | POA: Diagnosis not present

## 2022-11-11 ENCOUNTER — Other Ambulatory Visit (INDEPENDENT_AMBULATORY_CARE_PROVIDER_SITE_OTHER): Payer: Self-pay | Admitting: Family Medicine

## 2022-11-11 DIAGNOSIS — R632 Polyphagia: Secondary | ICD-10-CM

## 2022-11-13 ENCOUNTER — Other Ambulatory Visit: Payer: Medicaid Other | Admitting: Obstetrics and Gynecology

## 2022-11-13 ENCOUNTER — Encounter: Payer: Self-pay | Admitting: Obstetrics and Gynecology

## 2022-11-13 NOTE — Patient Instructions (Signed)
Hi April [name] a great day today and this weekend-thanks for talking to me today.  April [name] was given information about Medicaid Managed Care team care coordination services as a part of their Healthy [facility] Medicaid benefit. April [name] verbally consented to engagement with the [facility] Managed Care team.   If you are experiencing a medical emergency, please call 911 or report to your local emergency department or urgent care.   If you have a non-emergency medical problem during routine business hours, please contact your provider's office and ask to speak with a nurse.   For questions related to your Healthy [facility] health plan, please call: [phone] or visit the homepage here: [name]  If you would like to schedule transportation through your Healthy [facility] plan, please call the following number at least 2 days in advance of your appointment: [phone]  For information about your ride after you set it up, call Ride Assist at [phone]. Use this number to activate a Will Call pickup, or if your transportation is late for a scheduled pickup. Use this number, too, if you need to make a change or cancel a previously scheduled reservation.  If you need transportation services right away, call [phone]. The after-hours call center is staffed 24 hours to handle ride assistance and urgent reservation requests (including discharges) 365 days a year. Urgent trips include sick visits, hospital discharge requests and life-sustaining treatment.  Call the [place] at [phone], at any time, 24 hours a day, 7 days a week. If you are in danger or need immediate medical attention call 911.  If you would like help to quit smoking, call 1-800-QUIT-NOW 575-533-6893) OR Espaol: 1-855-Djelo-Ya (5-916-384-6659) o para ms informacin haga clic aqu or Text READY to 200-400 to register via text  Ms.  Attridge - following are the goals we discussed in your visit today:   Goals Addressed    Timeframe:  Long-Range Goal Priority:  High Start Date:         05/22/22                    Expected End Date:  ongoing                   Follow Up 12/15/22   - practice safe sex - schedule appointment for flu shot - schedule appointment for vaccines needed due to my age or health - schedule recommended health tests (blood work, mammogram, colonoscopy, pap test) - schedule and keep appointment for annual check-up    Why is this important?   Screening tests can find diseases early when they are easier to treat.  Your doctor or nurse will talk with you about which tests are important for you.  Getting shots for common diseases like the flu and shingles will help prevent them.  11/13/22:  follows with [facility], PCP 3/5  Patient verbalizes understanding of instructions and care plan provided today and agrees to view in [place]. Active MyChart status and patient understanding of how to access instructions and care plan via MyChart confirmed with patient.     The Managed Medicaid care management team will reach out to the patient again over the next 30 business  days.  The  Patient has been provided with contact information for the Managed Medicaid care management team and has been advised to call with any health related questions or concerns.   April Raider RN, BSN [place]  Care Management Coordinator - Managed Medicaid High Risk [phone]   Following is a copy of your plan of care:  Care Plan : [place] of Care  Updates made by April [name], RN since 11/13/2022 12:00 AM     Problem: Health Promotion or Disease Self-Management (General Plan of Care)      Long-Range Goal: Chronic Disease Management and Care Coordination Needs/SDOH   Start Date: 05/22/2022  Expected End Date: 01/18/2023  Priority: High  Note:   Current Barriers:  Care  Coordination needs related to Limited access to food resources and utility resources-completed. Chronic Disease Management HTN, GERD, osteoarthritis, low back pain, obesity, anxiety/depression, delusional disorder. 11/13/22:  Patient states she feels weak and emotionally drained today-continues to see therapist and is helping her.  Continues to go to the [facility] and attend [facility].  RNCM Clinical Goal(s):  Patient will take all medications exactly as prescribed and will call provider for medication related questions as evidenced by patient report demonstrate understanding of rationale for each prescribed medication as evidenced by patient report attend all scheduled medical appointments as evidenced by patient report continue to work with RN Care Manager to address care management and care coordination needs related to SDOH. HTN, GERD, osteoarthritis, LBP, obesity, anxiety/depression as evidenced by adherence to CM Team Scheduled appointments work with April [name] care guide to address needs related to  Limited access to food and utility resources as evidenced by patient and/or community resource care guide support through collaboration with Consulting civil engineer, provider, and care team.   Interventions: Inter-disciplinary care team collaboration (see longitudinal plan of care) Evaluation of current treatment plan related to  self management and patient's adherence to plan as established by provider Care Guide referral for utility resources and food resources-completed. Collaborated with Care Guide for food and utility resources  Hypertension Interventions:  (Status:  New goal.) Long Term Goal Last practice recorded BP readings:  BP Readings from Last 3 Encounters:  09/08/22 119/69  08/25/22 119/83  08/13/22 118/75  10/08/22          117/91 Most recent eGFR/CrCl:  Lab Results  Component Value Date   EGFR 81 07/14/2022    No components found for: "CRCL"  Evaluation of  current treatment plan related to hypertension self management and patient's adherence to plan as established by provider Reviewed medications with patient and discussed importance of compliance Discussed plans with patient for ongoing care management follow up and provided patient with direct contact information for care management team Advised patient, providing education and rationale, to monitor blood pressure daily and record, calling PCP for findings outside established parameters Reviewed scheduled/upcoming provider appointments  Assessed social determinant of health barriers  SDOH Barriers (Status:  New goal.) Long Term Goal Patient interviewed and SDOH assessment performed        SDOH Interventions    Flowsheet Row Most Recent Value  SDOH Interventions   Food Insecurity Interventions Other (Comment)  [Care Guide referral]  Transportation Interventions Intervention Not Indicated     Patient interviewed and appropriate assessments performed Referred patient to community resources care guide team for assistance with food and utility resources. Discussed plans with patient for ongoing care management follow up and provided patient with direct contact information for care management team  Patient Goals/Self-Care Activities: Take all medications as prescribed Attend all scheduled provider appointments Call pharmacy for medication refills 3-7 days in advance of running out of medications Perform all self care activities independently  Perform IADL's (shopping, preparing meals, housekeeping, managing finances) independently Call provider office for new concerns or questions  Patient will contact [employer] [facility] [employer]  Follow Up Plan:  The patient has been provided with contact information for the care management team and has been advised to call with any health related questions or concerns.  The care management team will reach out to the patient again over the next 30 business   days.

## 2022-11-13 NOTE — Patient Outreach (Signed)
Medicaid Managed Care   Nurse Care Manager Note  11/13/2022 Name:  April [name] MRN:  [id number] DOB:  08-23-1961  April [name] is an 62 y.o. year old female who is a primary patient of [place], [place], DO.  The [facility] Managed Care Coordination team was consulted for assistance with:    Chronic healthcare management needs, HTN, GERD, osteoarthritis, anxiety/depression  April [name] was given information about Medicaid Managed Care Coordination team services today. April [name] Patient agreed to services and verbal consent obtained.  Engaged with patient by telephone for follow up visit in response to provider referral for case management and/or care coordination services.   Assessments/Interventions:  Review of past medical history, allergies, medications, health status, including review of consultants reports, laboratory and other test data, was performed as part of comprehensive evaluation and provision of chronic care management services.  SDOH (Social Determinants of Health) assessments and interventions performed: SDOH Interventions    Flowsheet Row Patient Outreach Telephone from 11/13/2022 in [place] Patient Outreach Telephone from 10/19/2022 in [place] Patient Outreach Telephone from 09/14/2022 in [place] Patient Outreach Telephone from 08/05/2022 in [place] Patient Outreach Telephone from 07/02/2022 in [place] Coordination Telephone from 05/25/2022 in [place] Interventions        Food Insecurity Interventions Intervention Not Indicated -- -- -- -- Other (Comment)  [Mailed food pantry list]  Housing Interventions -- -- Intervention Not Indicated -- -- --  Transportation Interventions -- Intervention Not Indicated -- -- -- --  Utilities Interventions -- --  -- -- Ambulatory [id number] Order  [resources received in August] --  Alcohol Usage Interventions -- -- -- -- Intervention Not Indicated (Score <7) --  Financial Strain Interventions Intervention Not Indicated -- -- -- -- Other (Comment)  [mailed resources for utility assistance.]  Physical Activity Interventions -- Intervention Not Indicated -- -- -- --  Stress Interventions -- -- -- Provide Counseling  [sees therapist] -- --     Care Plan  Allergies  Allergen Reactions   Percocet [Oxycodone-Acetaminophen] Swelling   Naproxen Palpitations    Medications Reviewed Today     Reviewed by [name], RN (Registered Nurse) on 11/13/22 at [place] List Status: <None>   Medication Order Taking? Sig Documenting Provider Last Dose Status Informant  benztropine (COGENTIN) 1 MG tablet [id number] No Take 1 tablet (1 mg total) by mouth daily. April Busman, MD Taking Active   cetirizine (ZYRTEC) 10 MG tablet [id number] No Take 1 tablet (10 mg total) by mouth daily. Talbot Grumbling, FNP Taking Active   diclofenac Sodium (VOLTAREN) 1 % GEL [id number] No APPLY 2 GRAMS TOPICALLY TO THE AFFECTED AREA FOUR TIMES DAILY  Patient taking differently: 2 g 4 (four) times daily as needed (Apply to affected area for pain).   Donney Dice, DO Taking Active Self  haloperidol (HALDOL) 1 MG tablet [id number] No Take 1 tablet (1 mg total) by mouth 2 (two) times daily. April Busman, MD Taking Active   hydrochlorothiazide (HYDRODIURIL) 25 MG tablet [id number] No Take 1 tablet (25 mg total) by mouth daily. Bowen, Collene Leyden, DO Taking Active   topiramate (TOPAMAX) 25 MG tablet [id number] No Take 1 tablet (25 mg total) by mouth 2 (two) times daily. Bowen, Collene Leyden, DO Taking Active   traMADol (ULTRAM) 50 MG tablet [id number] No Take 1 tablet (50  mg total) by mouth daily. Donney Dice, DO Taking Active   Vitamin D, Ergocalciferol, (DRISDOL) 1.25 MG (50000 UNIT) CAPS capsule [id number] No One Capsule by mouth every 14 days  April Ponto, DO Taking Active            Patient Active Problem List   Diagnosis Date Noted   Osteoarthritis of left knee 11/02/2022   Left knee pain 08/25/2022   Polyphagia 08/13/2022   Callus 07/23/2022   Benign nevus of plantar aspect of foot 06/29/2022   Delusional disorder ([place]) 03/14/2022   Pap smear for cervical cancer screening 09/16/2021   Tension headache 01/01/2021   Essential hypertension 08/15/2020   Insulin resistance 07/11/2019   Class 3 severe obesity with serious comorbidity and body mass index (BMI) of 50.0 to 59.9 in adult [facility]) 07/11/2019   Vitamin D deficiency 03/15/2019   Vitiligo 03/03/2018   Insomnia 11/05/2017   Fatigue 03/02/2016   Osteoarthritis of knees, bilateral 09/23/2015   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   Low back pain 01/27/2012   GERD 03/22/2009   Anxiety state 11/14/2008   Eczema 05/18/2008   Depression 03/10/2007   HYPERCHOLESTEROLEMIA 12/09/2006   Anemia 12/09/2006   Conditions to be addressed/monitored per PCP order:  Chronic healthcare management needs, HTN, GERD, osteoarthritis, anxiety/depression  Care Plan : RN Care Manager Plan of Care  Updates made by [name], RN since 11/13/2022 12:00 AM     Problem: Health Promotion or Disease Self-Management (General Plan of Care)      Long-Range Goal: Chronic Disease Management and Care Coordination Needs/SDOH   Start Date: 05/22/2022  Expected End Date: 01/18/2023  Priority: High  Note:   Current Barriers:  Care Coordination needs related to Limited access to food resources and utility resources-completed. Chronic Disease Management HTN, GERD, osteoarthritis, low back pain, obesity, anxiety/depression, delusional disorder. 11/13/22:  Patient states she feels weak and emotionally drained today-continues to see therapist and is helping her.  Continues to go to the [facility] and [facility].  RNCM Clinical Goal(s):  Patient will take all medications exactly  as prescribed and will call provider for medication related questions as evidenced by patient report demonstrate understanding of rationale for each prescribed medication as evidenced by patient report attend all scheduled medical appointments as evidenced by patient report continue to work with RN Care Manager to address care management and care coordination needs related to SDOH. HTN, GERD, osteoarthritis, LBP, obesity, anxiety/depression as evidenced by adherence to CM Team Scheduled appointments work with [employer] care guide to address needs related to  Limited access to food and utility resources as evidenced by patient and/or community resource care guide support through collaboration with Consulting civil engineer, provider, and care team.   Interventions: Inter-disciplinary care team collaboration (see longitudinal plan of care) Evaluation of current treatment plan related to  self management and patient's adherence to plan as established by provider Care Guide referral for utility resources and food resources-completed. Collaborated with Care Guide for food and utility resources  Hypertension Interventions:  (Status:  New goal.) Long Term Goal Last practice recorded BP readings:  BP Readings from Last 3 Encounters:  09/08/22 119/69  08/25/22 119/83  08/13/22 118/75  10/08/22          117/91 Most recent eGFR/CrCl:  Lab Results  Component Value Date   EGFR 81 07/14/2022    No components found for: "CRCL"  Evaluation of current treatment plan related to hypertension self management and patient's  adherence to plan as established by provider Reviewed medications with patient and discussed importance of compliance Discussed plans with patient for ongoing care management follow up and provided patient with direct contact information for care management team Advised patient, providing education and rationale, to monitor blood pressure daily and record, calling PCP for findings outside  established parameters Reviewed scheduled/upcoming provider appointments  Assessed social determinant of health barriers  SDOH Barriers (Status:  New goal.) Long Term Goal Patient interviewed and SDOH assessment performed        SDOH Interventions    Flowsheet Row Most Recent Value  SDOH Interventions   Food Insecurity Interventions Other (Comment)  [Care Guide referral]  Transportation Interventions Intervention Not Indicated     Patient interviewed and appropriate assessments performed Referred patient to community resources care guide team for assistance with food and utility resources. Discussed plans with patient for ongoing care management follow up and provided patient with direct contact information for care management team  Patient Goals/Self-Care Activities: Take all medications as prescribed Attend all scheduled provider appointments Call pharmacy for medication refills 3-7 days in advance of running out of medications Perform all self care activities independently  Perform IADL's (shopping, preparing meals, housekeeping, managing finances) independently Call provider office for new concerns or questions  Patient will contact [facility] [employer]  Follow Up Plan:  The patient has been provided with contact information for the care management team and has been advised to call with any health related questions or concerns.  The care management team will reach out to the patient again over the next 30 business  days.   Long-Range Goal: Establish Plan of Care for Chronic Disease Management Needs/SDOH   Priority: High  Note:   Timeframe:  Long-Range Goal Priority:  High Start Date:         05/22/22                    Expected End Date:  ongoing                   Follow Up 12/15/22   - practice safe sex - schedule appointment for flu shot - schedule appointment for vaccines needed due to my age or health - schedule recommended health tests (blood work, mammogram,  colonoscopy, pap test) - schedule and keep appointment for annual check-up    Why is this important?   Screening tests can find diseases early when they are easier to treat.  Your doctor or nurse will talk with you about which tests are important for you.  Getting shots for common diseases like the flu and shingles will help prevent them.  11/13/22:  follows with [facility], PCP 3/5   Follow Up:  Patient agrees to Care Plan and Follow-up.  Plan: The Managed Medicaid care management team will reach out to the patient again over the next 30 business  days. and The  Patient has been provided with contact information for the Managed Medicaid care management team and has been advised to call with any health related questions or concerns.  Date/time of next scheduled RN care management/care coordination outreach:  12/15/22 at 0900

## 2022-11-20 NOTE — Progress Notes (Unsigned)
Chief Complaint:   OBESITY April [name] is here to discuss her progress with her obesity treatment plan along with follow-up of her obesity related diagnoses. April [name] is on the Category 2 Plan and states she is following her eating plan approximately 90% of the time. April [name] states she is going to the [facility] and doing some walking 30 minutes 1 times per week.  Today's visit was #: 64 Starting weight: 256 lbs Starting date: 06/13/2019 Today's weight: 220 lbs Today's date: 11/02/2022 Total lbs lost to date: 36 Total lbs lost since last in-office visit: 3  Interim History: April [name] is doing better with meal planning and logging intake. She is trying to stay active. She has occasional soda and sweet tea and eats out 1-2 times a week. Pt gained 1.8 lbs of muscle and lost 4.8 lbs of body fat. She is getting in 2 meals and 1 snack most days.  Subjective:   1. Osteoarthritis of left knee, unspecified osteoarthritis type Pt is seeing ortho every 3 months. She has had corticosteroid injections. Weight loss has helped reduce pain. Pt had right total knee replacement in 2016. She hopes to wait in left total knee replacement.  2. Polyphagia Symptoms are improving on Topamax 25 mg twice a day.  She denies adverse side effects. Pt has not been hitting daily protein goal.  3. Vitamin D deficiency Pt is taking prescription Vitamin D 50,000 IU every 14 days.  On 07/14/2022 her Vit D level was 67.  Assessment/Plan:   1. Osteoarthritis of left knee, unspecified osteoarthritis type Continue weight reduction. We discussed adding water exercise at the [facility] 2 times a week.  2. Polyphagia Continue Topamax 25 mg as prescribed.  Refill- topiramate (TOPAMAX) 25 MG tablet; Take 1 tablet (25 mg total) by mouth 2 (two) times daily.  Dispense: 60 tablet; Refill: 1  3. Vitamin D deficiency Recheck level in 2-3 months. Continue prescription Vitamin D 50,000 IU every 14 days.  Refill- Vitamin D,  Ergocalciferol, (DRISDOL) 1.25 MG (50000 UNIT) CAPS capsule; One Capsule by mouth every 14 days  Dispense: 4 capsule; Refill: 0  4. Obesity,current BMI 43.0 Reviewed overall progress. She is down 36 lbs in 3+ years of medically supervised weight management, which is 14% total body weight loss. Stop meal skipping.  April [name] is currently in the action stage of change. As such, her goal is to continue with weight loss efforts. She has agreed to the Category 2 Plan.   Exercise goals: Do water exercises 2 times a week.  Behavioral modification strategies: increasing lean protein intake, increasing vegetables, increasing water intake, decreasing eating out, no skipping meals, meal planning and cooking strategies, keeping healthy foods in the home, and planning for success.  April [name] has agreed to follow-up with our clinic in 7 weeks. She was informed of the importance of frequent follow-up visits to maximize her success with intensive lifestyle modifications for her multiple health conditions.   Objective:   Blood pressure 109/74, pulse 80, temperature 98.1 F (36.7 C), height 5' (1.524 m), weight 220 lb (99.8 kg), SpO2 97 %. Body mass index is 42.97 kg/m.  General: Cooperative, alert, well developed, in no acute distress. HEENT: Conjunctivae and lids unremarkable. Cardiovascular: Regular rhythm.  Lungs: Normal work of breathing. Neurologic: No focal deficits.   Lab Results  Component Value Date   CREATININE 0.83 07/14/2022   BUN 12 07/14/2022   NA 141 07/14/2022   K 4.9 07/14/2022   CL 102 07/14/2022   CO2 24  07/14/2022   Lab Results  Component Value Date   ALT 14 07/14/2022   AST 18 07/14/2022   ALKPHOS 67 07/14/2022   BILITOT 0.3 07/14/2022   Lab Results  Component Value Date   HGBA1C 5.5 03/13/2022   HGBA1C 5.6 11/06/2021   HGBA1C 5.5 04/29/2021   HGBA1C 7.3 (H) 04/07/2021   HGBA1C 5.3 11/06/2020   Lab Results  Component Value Date   INSULIN 7.3 11/06/2021    INSULIN 3.6 04/07/2021   INSULIN 13.8 11/06/2020   INSULIN 11.6 07/10/2020   INSULIN 7.5 03/20/2020   Lab Results  Component Value Date   TSH 0.949 03/13/2022   Lab Results  Component Value Date   CHOL 198 03/13/2022   HDL 52 03/13/2022   LDLCALC 129 (H) 03/13/2022   LDLDIRECT 153 (H) 08/16/2020   TRIG 87 03/13/2022   CHOLHDL 3.8 03/13/2022   Lab Results  Component Value Date   VD25OH 79.9 11/06/2021   VD25OH 65.2 04/07/2021   VD25OH 61.9 11/06/2020   Lab Results  Component Value Date   WBC 8.1 03/13/2022   HGB 14.0 03/13/2022   HCT 44.0 03/13/2022   MCV 89.8 03/13/2022   PLT 296 03/13/2022   Lab Results  Component Value Date   IRON 36 (L) 12/11/2009   TIBC 318 12/11/2009   FERRITIN 19 12/11/2009   Attestation Statements:   Reviewed by clinician on day of visit: allergies, medications, problem list, medical history, surgical history, family history, social history, and previous encounter notes.  I, Kathlene November, BS, CMA, am acting as transcriptionist for Loyal Gambler, DO.   I have reviewed the above documentation for accuracy and completeness, and I agree with the above. Dell Ponto, DO

## 2022-11-23 ENCOUNTER — Encounter: Payer: Self-pay | Admitting: Family Medicine

## 2022-11-23 ENCOUNTER — Other Ambulatory Visit (HOSPITAL_COMMUNITY)
Admission: RE | Admit: 2022-11-23 | Discharge: 2022-11-23 | Disposition: A | Discharge status: Home / Self Care | Payer: Medicaid Other | Source: Ambulatory Visit | Attending: Family Medicine | Admitting: Family Medicine

## 2022-11-23 ENCOUNTER — Ambulatory Visit: Payer: Medicaid Other | Admitting: Family Medicine

## 2022-11-23 VITALS — BP 101/83 | HR 71 | Ht 60.0 in | Wt 222.0 lb

## 2022-11-23 DIAGNOSIS — N898 Other specified noninflammatory disorders of vagina: Secondary | ICD-10-CM | POA: Insufficient documentation

## 2022-11-23 DIAGNOSIS — I10 Essential (primary) hypertension: Secondary | ICD-10-CM

## 2022-11-23 NOTE — Progress Notes (Signed)
    SUBJECTIVE:   CHIEF COMPLAINT / HPI:   Patient presents with vaginal irritation for about 6 months. Endorses vaginal discharge for the last 4 weeks that she notices intermittently. Denies any associated odor. Denies fever, chills, pelvic pain or other symptoms. No new sexual partners and denies any recent sexual activity. History of chlamydia about 30 years ago. Denies any other concerns at this time.   OBJECTIVE:   BP 101/83   Pulse 71   Ht 5' (1.524 m)   Wt 222 lb (100.7 kg)   SpO2 98%   BMI 43.36 kg/m   General: Patient well-appearing, in no acute distress. Resp: normal work of breathing noted  GU: normal labia, hypopigmented area that is chronic, no rashes or lesions noted, white, thick cervical discharge noted without associated odor   GU exam performed in the presence of chaperone, [employer], CMA.   ASSESSMENT/PLAN:   Vaginal discharge -pending GC/Chlamydia, BV, yeast and trichomonas testing -patient politely declined RPR and HIV, she is low risk   Essential hypertension -BP 101/83, at goal -continue HCTZ 25 mg daily    -PHQ-9 score of 3 with negative question 9 reviewed.    Donney Dice, [place]

## 2022-11-23 NOTE — Assessment & Plan Note (Addendum)
-  BP 101/83, at goal -continue HCTZ 25 mg daily

## 2022-11-23 NOTE — Assessment & Plan Note (Addendum)
-  pending GC/Chlamydia, BV, yeast and trichomonas testing -patient politely declined RPR and HIV, she is low risk

## 2022-11-23 NOTE — Patient Instructions (Addendum)
It was great seeing you today!  Today we discussed your vaginal discharge, we did testing for this and I will let you know of the results.   Please follow up at your next scheduled appointment in 2 months, if anything arises between now and then, please don't hesitate to contact our office.   Thank you for allowing [place] to be a part of your medical care!  Thank you, Dr. Larae Grooms  Also a reminder of our clinic's no-show policy. Please make sure to arrive at least 15 minutes prior to your scheduled appointment time. Please try to cancel before 24 hours if you are not able to make it. If you no-show for 2 appointments then you will be receiving a warning letter. If you no-show after 3 visits, then you may be at risk of being dismissed from our clinic. This is to ensure that everyone is able to be seen in a timely manner. Thank you, we appreciate your assistance with this!

## 2022-11-24 DIAGNOSIS — M1712 Unilateral primary osteoarthritis, left knee: Secondary | ICD-10-CM | POA: Diagnosis not present

## 2022-11-27 ENCOUNTER — Telehealth: Payer: Self-pay

## 2022-11-27 LAB — CERVICOVAGINAL ANCILLARY ONLY
Bacterial Vaginitis (gardnerella): NEGATIVE
Candida Glabrata: NEGATIVE
Candida Vaginitis: NEGATIVE
Chlamydia: NEGATIVE
Comment: NEGATIVE
Comment: NEGATIVE
Comment: NEGATIVE
Comment: NEGATIVE
Comment: NEGATIVE
Comment: NORMAL
Neisseria Gonorrhea: NEGATIVE
Trichomonas: NEGATIVE

## 2022-11-27 MED ORDER — FLUCONAZOLE 150 MG PO TABS: 150.0000 mg | ORAL_TABLET | Freq: Once | ORAL | 0 refills | Status: AC

## 2022-11-27 NOTE — Telephone Encounter (Signed)
Patient calls nurse line regarding results from visit on 2/12.  Advised of negative results. Patient is asking if provider will still send in medication due to continued vaginal discharge.   Will forward to PCP.   Talbot Grumbling, RN

## 2022-11-27 NOTE — Telephone Encounter (Signed)
Called patient and informed of provider message.   Return precautions discussed.   Talbot Grumbling, RN

## 2022-12-15 ENCOUNTER — Encounter (INDEPENDENT_AMBULATORY_CARE_PROVIDER_SITE_OTHER): Payer: Self-pay | Admitting: Physician Assistant

## 2022-12-15 ENCOUNTER — Ambulatory Visit (INDEPENDENT_AMBULATORY_CARE_PROVIDER_SITE_OTHER): Payer: Medicaid Other | Admitting: Physician Assistant

## 2022-12-15 ENCOUNTER — Other Ambulatory Visit: Payer: Medicaid Other | Admitting: Obstetrics and Gynecology

## 2022-12-15 VITALS — BP 97/61 | HR 70 | Temp 98.4°F | Ht 60.0 in | Wt 220.0 lb

## 2022-12-15 DIAGNOSIS — E559 Vitamin D deficiency, unspecified: Secondary | ICD-10-CM | POA: Diagnosis not present

## 2022-12-15 DIAGNOSIS — E669 Obesity, unspecified: Secondary | ICD-10-CM

## 2022-12-15 DIAGNOSIS — R632 Polyphagia: Secondary | ICD-10-CM

## 2022-12-15 DIAGNOSIS — Z6841 Body Mass Index (BMI) 40.0 and over, adult: Secondary | ICD-10-CM

## 2022-12-15 MED ORDER — TOPIRAMATE 25 MG PO TABS: 25.0000 mg | ORAL_TABLET | Freq: Every day | ORAL | 1 refills | Status: DC

## 2022-12-15 NOTE — Progress Notes (Signed)
Office: [phone]  /  Fax: [phone]  WEIGHT SUMMARY AND BIOMETRICS  Weight Lost Since Last Visit: 0 lb  No data recorded  Vitals Temp: 98.4 F (36.9 C) BP: 97/61 Pulse Rate: 70 SpO2: 97 %   Anthropometric Measurements Height: 5' (1.524 m) Weight: 220 lb (99.8 kg) BMI (Calculated): 42.97 Weight at Last Visit: 220 lb Weight Lost Since Last Visit: 0 lb Starting Weight: 256 lb   Body Composition  Body Fat %: 50.4 % Fat Mass (lbs): 111.2 lbs Muscle Mass (lbs): 103.8 lbs Total Body Water (lbs): 75.6 lbs Visceral Fat Rating : 17   Other Clinical Data Fasting: no Labs: no Today's Visit #: 55 Starting Date: 06/13/19     HPI  Chief Complaint: OBESITY  April [name] is here to discuss her progress with her obesity treatment plan. She is on the the Category 2 Plan and keeping a food journal and adhering to recommended goals of 1200 calories and 75 grams of protein and states she is following her eating plan approximately 90 % of the time. She states she is exercising going to the [facility] walking 30 minutes 2 days per week.   Interval History:  Since last office visit she has been carefully journaling her intake and is doing well with her nutrition plan.  She was a little disappointed that she had not lost more weight, but has lost 36 pounds overall. She has been trying to stay more active and going to the [facility] and walking. Hunger and appetite are controlled overall and not excessive.   Pharmacotherapy for weight loss: She is currently taking Topamax for medical weight loss.  Denies side effects.     PHYSICAL EXAM:  Blood pressure 97/61, pulse 70, temperature 98.4 F (36.9 C), height 5' (1.524 m), weight 220 lb (99.8 kg), SpO2 97 %. Body mass index is 42.97 kg/m.  General: She is overweight, cooperative, alert, well developed, and in no acute distress. PSYCH: Has normal mood, affect and thought process.   Extremities: No edema.  Neurologic: No gross sensory  or motor deficits. No tremors or fasciculations noted.    DIAGNOSTIC DATA REVIEWED:  BMET    Component Value Date/Time   NA 141 07/14/2022 1642   K 4.9 07/14/2022 1642   CL 102 07/14/2022 1642   CO2 24 07/14/2022 1642   GLUCOSE 86 07/14/2022 1642   GLUCOSE 93 03/13/2022 1548   BUN 12 07/14/2022 1642   CREATININE 0.83 07/14/2022 1642   CREATININE 0.87 03/02/2016 1137   CALCIUM 9.9 07/14/2022 1642   GFRNONAA >60 03/13/2022 1548   GFRNONAA 76 03/02/2016 1137   GFRAA 111 11/06/2020 0907   GFRAA 88 03/02/2016 1137   Lab Results  Component Value Date   HGBA1C 5.5 03/13/2022   HGBA1C 5.5 12/01/2012   Lab Results  Component Value Date   INSULIN 7.3 11/06/2021   INSULIN 9.5 06/13/2019   Lab Results  Component Value Date   TSH 0.949 03/13/2022   CBC    Component Value Date/Time   WBC 8.1 03/13/2022 1548   RBC 4.90 03/13/2022 1548   HGB 14.0 03/13/2022 1548   HGB 15.1 10/24/2019 1237   HCT 44.0 03/13/2022 1548   HCT 45.4 10/24/2019 1237   PLT 296 03/13/2022 1548   PLT 354 10/24/2019 1237   MCV 89.8 03/13/2022 1548   MCV 85 10/24/2019 1237   MCH 28.6 03/13/2022 1548   MCHC 31.8 03/13/2022 1548   RDW 15.3 03/13/2022 1548   RDW 13.8 10/24/2019 1237  Iron Studies    Component Value Date/Time   IRON 36 (L) 12/11/2009 1848   TIBC 318 12/11/2009 1848   FERRITIN 19 12/11/2009 1848   IRONPCTSAT 11 (L) 12/11/2009 1848   Lipid Panel     Component Value Date/Time   CHOL 198 03/13/2022 1548   CHOL 231 (H) 11/06/2021 0956   TRIG 87 03/13/2022 1548   HDL 52 03/13/2022 1548   HDL 63 11/06/2021 0956   CHOLHDL 3.8 03/13/2022 1548   VLDL 17 03/13/2022 1548   LDLCALC 129 (H) 03/13/2022 1548   LDLCALC 153 (H) 11/06/2021 0956   LDLDIRECT 153 (H) 08/16/2020 1007   LDLDIRECT 108 (H) 12/01/2012 1506   Hepatic Function Panel     Component Value Date/Time   PROT 7.7 07/14/2022 1642   ALBUMIN 4.7 07/14/2022 1642   AST 18 07/14/2022 1642   ALT 14 07/14/2022 1642   ALKPHOS  67 07/14/2022 1642   BILITOT 0.3 07/14/2022 1642      Component Value Date/Time   TSH 0.949 03/13/2022 1548   TSH 1.420 06/01/2018 0946   Nutritional Lab Results  Component Value Date   VD25OH 79.9 11/06/2021   VD25OH 65.2 04/07/2021   VD25OH 61.9 11/06/2020     ASSESSMENT AND PLAN  TREATMENT PLAN FOR OBESITY:  Recommended Dietary Goals  April [name] is currently in the action stage of change. As such, her goal is to continue weight management plan. She has agreed to the Category 2 Plan and keeping a food journal and adhering to recommended goals of 1200 calories and 75+ grams of protein.  Behavioral Intervention  We discussed the following Behavioral Modification Strategies today: increasing lean protein intake, decreasing simple carbohydrates , increasing vegetables, and increasing water intake.  Additional resources provided today: NA  Recommended Physical Activity Goals  April [name] has been advised to work up to 150 minutes of moderate intensity aerobic activity a week and strengthening exercises 2-3 times per week for cardiovascular health, weight loss maintenance and preservation of muscle mass.   She has agreed to increase physical activity in their day and reduce sedentary time (increase NEAT).  and continue physical activity as is.    Pharmacotherapy We discussed various medication options to help April [name] with her weight loss efforts and we both agreed to continue topamax 25 mg once daily.  ASSOCIATED CONDITIONS ADDRESSED TODAY  Action/Plan  Polyphagia -     Topiramate; Take 1 tablet (25 mg total) by mouth daily.  Dispense: 30 tablet; Refill: 1  Vitamin D deficiency  Obesity ([place])- Start BMI 50.0  BMI 40.0-44.9, adult ([place]) Current BMI 43.1   Polyphagia Talon endorses excessive hunger but improved with topamax once daily.  Medication(s): Topamax once daily  Effects of medication:  well controlled. Cravings are well controlled.   Plan: Continue and  Refill topamax 25 mg once daily.    Vitamin D Deficiency Vitamin D is at goal of 50.  Most recent vitamin D level was 79.9. She is on  prescription ergocalciferol 50,000 IU every 14 days. (Decreased from weekly.)  Lab Results  Component Value Date   VD25OH 79.9 11/06/2021   VD25OH 65.2 04/07/2021   VD25OH 61.9 11/06/2020    Plan: Continue prescription vitamin D 50,000 IU every 14 days. Recheck vitamin D level 2-3 times yearly to avoid over supplementation.     Return in about 4 weeks (around 01/12/2023).[name] Kitchen She was informed of the importance of frequent follow up visits to maximize her success with intensive lifestyle modifications for her multiple  health conditions.   ATTESTASTION STATEMENTS:  Reviewed by clinician on day of visit: allergies, medications, problem list, medical history, surgical history, family history, social history, and previous encounter notes.   Time spent on visit including pre-visit chart review and post-visit care and charting was 42 minutes.    Rocky Gladden,PA-C

## 2022-12-15 NOTE — Patient Outreach (Signed)
  Medicaid Managed Care   Unsuccessful Attempt Note   12/15/2022 Name: April [name] MRN: [id number] DOB: 1961/02/24  Referred by: Donney Dice, DO Reason for referral : High Risk Managed Medicaid (Unsuccessful telephone outreach)   An unsuccessful telephone outreach was attempted today. The patient was referred to the case management team for assistance with care management and care coordination.    Follow Up Plan: The Managed Medicaid care management team will reach out to the patient again over the next 30 business  days. and The  Patient has been provided with contact information for the Managed Medicaid care management team and has been advised to call with any health related questions or concerns.    [name] RN, BSN [place]  [facility] - Managed Medicaid High Risk [phone]

## 2022-12-15 NOTE — Patient Instructions (Signed)
Hi Ms. Zeno, sorry to have missed you today, hope you are doing okay - as a part of your Medicaid benefit, you are eligible for care management and care coordination services at no cost or copay. I was unable to reach you by phone today but would be happy to help you with your health related needs. Please feel free to call me at [phone].  A member of the Managed Medicaid care management team will reach out to you again over the next 30 business days.   [name] RN, BSN [place]  [facility] - Managed Medicaid High Risk [phone]

## 2022-12-18 ENCOUNTER — Other Ambulatory Visit (INDEPENDENT_AMBULATORY_CARE_PROVIDER_SITE_OTHER): Payer: Self-pay | Admitting: Family Medicine

## 2022-12-18 DIAGNOSIS — E559 Vitamin D deficiency, unspecified: Secondary | ICD-10-CM

## 2022-12-31 DIAGNOSIS — F418 Other specified anxiety disorders: Secondary | ICD-10-CM | POA: Diagnosis not present

## 2023-01-01 ENCOUNTER — Encounter: Payer: Self-pay | Admitting: Family Medicine

## 2023-01-01 ENCOUNTER — Ambulatory Visit: Payer: Medicaid Other | Admitting: Family Medicine

## 2023-01-01 VITALS — BP 100/58 | HR 61 | Ht 60.0 in | Wt 224.2 lb

## 2023-01-01 DIAGNOSIS — N898 Other specified noninflammatory disorders of vagina: Secondary | ICD-10-CM | POA: Diagnosis not present

## 2023-01-01 NOTE — Patient Instructions (Signed)
It was great seeing you today!  Today we discussed your vaginal discharge, it seems that this is due to normal, physiologic discharge that is completely normal to get every once in awhile. If you start to have vaginal discharge that is not clear, bleeding, itching, fever, chills, foul odor, abdominal or pelvic pain then please schedule to be seen sooner.   Please follow up at your next scheduled appointment, if anything arises between now and then, please don't hesitate to contact our office.   Thank you for allowing [place] to be a part of your medical care!  Thank you, Dr. Larae Grooms  Also a reminder of our clinic's no-show policy. Please make sure to arrive at least 15 minutes prior to your scheduled appointment time. Please try to cancel before 24 hours if you are not able to make it. If you no-show for 2 appointments then you will be receiving a warning letter. If you no-show after 3 visits, then you may be at risk of being dismissed from our clinic. This is to ensure that everyone is able to be seen in a timely manner. Thank you, we appreciate your assistance with this!

## 2023-01-01 NOTE — Assessment & Plan Note (Addendum)
-  reassurance provided, it seems that this appears to be normal, physiologic discharge.  -patient is low risk for STI, she had cervical testing performed recently. Discussed blood work and she politely declines which I agree with given that I have a very low suspicion for an STI as she has not been sexually active  -strict return precautions discussed  -follow up as appropriate

## 2023-01-01 NOTE — Progress Notes (Signed)
    SUBJECTIVE:   CHIEF COMPLAINT / HPI:   Patient presents with vaginal discharge. Recently came in with this concern recently, at the time she had cervical testing done which was negative for gonorrhea, chlamydia, trichomonas, candida and BV. Was treated on a clinical basis with fluconazole. After this, she felt better. Denies any vaginal discharge currently and denies foul odor. Denies any rashes. Denies fever, chills, nausea, vomiting, pelvic or abdominal pain. No new partners. Last time she was sexually active was years ago. She sees very little fluid in her underwear that is clear.   OBJECTIVE:   BP (!) 100/58   Pulse 61   Ht 5' (1.524 m)   Wt 224 lb 3.2 oz (101.7 kg)   SpO2 99%   BMI 43.79 kg/m   General: Patient well-appearing, in no acute distress. CV: RRR, no murmurs or gallops auscultated Resp: CTAB, no wheezing, rales or rhonchi noted Abdomen: soft, nontender, nondistended, presence of bowel sounds GU: patient politely declined exam  Psych: mood appropriate, very pleasant   ASSESSMENT/PLAN:   Vaginal discharge -reassurance provided, it seems that this appears to be normal, physiologic discharge.  -patient is low risk for STI, she had cervical testing performed recently. Discussed blood work and she politely declines which I agree with given that I have a very low suspicion for an STI as she has not been sexually active  -strict return precautions discussed  -follow up as appropriate     Donney Dice, [place]

## 2023-01-06 ENCOUNTER — Encounter (INDEPENDENT_AMBULATORY_CARE_PROVIDER_SITE_OTHER): Payer: Self-pay | Admitting: Physician Assistant

## 2023-01-06 ENCOUNTER — Ambulatory Visit (INDEPENDENT_AMBULATORY_CARE_PROVIDER_SITE_OTHER): Payer: Medicaid Other | Admitting: Physician Assistant

## 2023-01-06 ENCOUNTER — Other Ambulatory Visit (INDEPENDENT_AMBULATORY_CARE_PROVIDER_SITE_OTHER): Payer: Self-pay | Admitting: Physician Assistant

## 2023-01-06 VITALS — BP 122/75 | Temp 97.7°F | Ht 60.0 in | Wt 222.0 lb

## 2023-01-06 DIAGNOSIS — E88819 Insulin resistance, unspecified: Secondary | ICD-10-CM | POA: Diagnosis not present

## 2023-01-06 DIAGNOSIS — I10 Essential (primary) hypertension: Secondary | ICD-10-CM | POA: Diagnosis not present

## 2023-01-06 DIAGNOSIS — E669 Obesity, unspecified: Secondary | ICD-10-CM | POA: Diagnosis not present

## 2023-01-06 DIAGNOSIS — R632 Polyphagia: Secondary | ICD-10-CM | POA: Diagnosis not present

## 2023-01-06 DIAGNOSIS — Z6841 Body Mass Index (BMI) 40.0 and over, adult: Secondary | ICD-10-CM | POA: Diagnosis not present

## 2023-01-06 DIAGNOSIS — E559 Vitamin D deficiency, unspecified: Secondary | ICD-10-CM | POA: Diagnosis not present

## 2023-01-06 MED ORDER — TOPIRAMATE 25 MG PO TABS: 50.0000 mg | ORAL_TABLET | Freq: Every day | ORAL | 1 refills | Status: DC

## 2023-01-06 MED ORDER — VITAMIN D (ERGOCALCIFEROL) 1.25 MG (50000 UNIT) PO CAPS: ORAL_CAPSULE | ORAL | 0 refills | Status: DC

## 2023-01-06 NOTE — Progress Notes (Addendum)
Office: [phone]  /  Fax: [phone]  WEIGHT SUMMARY AND BIOMETRICS  Vitals Temp: 97.7 F (36.5 C) BP: 122/75   Anthropometric Measurements Height: 5' (1.524 m) Weight: 222 lb (100.7 kg) BMI (Calculated): 43.36 Weight at Last Visit: 220 lb Weight Lost Since Last Visit: 0 lb Weight Gained Since Last Visit: 2 lb Starting Weight: 256 lb Total Weight Loss (lbs): 36 lb (16.3 kg)   Body Composition  Body Fat %: 50.6 % Fat Mass (lbs): 112.4 lbs Muscle Mass (lbs): 104.2 lbs Total Body Water (lbs): 76.6 lbs Visceral Fat Rating : 17   Other Clinical Data Fasting: No Labs: No Today's Visit #: 35 Starting Date: 06/13/19     HPI  Chief Complaint: OBESITY  April [name] is here to discuss her progress with her obesity treatment plan. She is on the the Category 2 Plan and states she is following her eating plan approximately 85 % of the time. She states she is exercising/walking 40 minutes 2-3 times per week.   Interval History:  Since last office visit she has been exercising more over the past few weeks. She is up 2 lbs, but up 0.6 lbs muscle mass, 1 lbs water and 1.2 lbs adipose.   She is focusing on getting protein needs met. She is cooking a lot of [place] breasts.  Trying to get in more fruits and vegetables.  Discussed strategies for quicker meal prep ideas and cooking for one person.     Pharmacotherapy: Topiramate 25 mg daily. Discussed increasing topiramate to 50 mg at bedtime and monitor response and for any side effects.   PHYSICAL EXAM:  Blood pressure 122/75, temperature 97.7 F (36.5 C), height 5' (1.524 m), weight 222 lb (100.7 kg). Body mass index is 43.36 kg/m.  General: She is overweight, cooperative, alert, well developed, and in no acute distress. PSYCH: Has normal mood, affect and thought process.   Cardiovascular: regular rhythm Lungs: Normal breathing effort, no conversational dyspnea. Neuro: no focal deficits DIAGNOSTIC DATA  REVIEWED:  BMET    Component Value Date/Time   NA 141 07/14/2022 1642   K 4.9 07/14/2022 1642   CL 102 07/14/2022 1642   CO2 24 07/14/2022 1642   GLUCOSE 86 07/14/2022 1642   GLUCOSE 93 03/13/2022 1548   BUN 12 07/14/2022 1642   CREATININE 0.83 07/14/2022 1642   CREATININE 0.87 03/02/2016 1137   CALCIUM 9.9 07/14/2022 1642   GFRNONAA >60 03/13/2022 1548   GFRNONAA 76 03/02/2016 1137   GFRAA 111 11/06/2020 0907   GFRAA 88 03/02/2016 1137   Lab Results  Component Value Date   HGBA1C 5.5 03/13/2022   HGBA1C 5.5 12/01/2012   Lab Results  Component Value Date   INSULIN 7.3 11/06/2021   INSULIN 9.5 06/13/2019   Lab Results  Component Value Date   TSH 0.949 03/13/2022   CBC    Component Value Date/Time   WBC 8.1 03/13/2022 1548   RBC 4.90 03/13/2022 1548   HGB 14.0 03/13/2022 1548   HGB 15.1 10/24/2019 1237   HCT 44.0 03/13/2022 1548   HCT 45.4 10/24/2019 1237   PLT 296 03/13/2022 1548   PLT 354 10/24/2019 1237   MCV 89.8 03/13/2022 1548   MCV 85 10/24/2019 1237   MCH 28.6 03/13/2022 1548   MCHC 31.8 03/13/2022 1548   RDW 15.3 03/13/2022 1548   RDW 13.8 10/24/2019 1237   Iron Studies    Component Value Date/Time   IRON 36 (L) 12/11/2009 1848   TIBC 318 12/11/2009  1848   FERRITIN 19 12/11/2009 1848   IRONPCTSAT 11 (L) 12/11/2009 1848   Lipid Panel     Component Value Date/Time   CHOL 198 03/13/2022 1548   CHOL 231 (H) 11/06/2021 0956   TRIG 87 03/13/2022 1548   HDL 52 03/13/2022 1548   HDL 63 11/06/2021 0956   CHOLHDL 3.8 03/13/2022 1548   VLDL 17 03/13/2022 1548   LDLCALC 129 (H) 03/13/2022 1548   LDLCALC 153 (H) 11/06/2021 0956   LDLDIRECT 153 (H) 08/16/2020 1007   LDLDIRECT 108 (H) 12/01/2012 1506   Hepatic Function Panel     Component Value Date/Time   PROT 7.7 07/14/2022 1642   ALBUMIN 4.7 07/14/2022 1642   AST 18 07/14/2022 1642   ALT 14 07/14/2022 1642   ALKPHOS 67 07/14/2022 1642   BILITOT 0.3 07/14/2022 1642      Component Value  Date/Time   TSH 0.949 03/13/2022 1548   TSH 1.420 06/01/2018 0946   Nutritional Lab Results  Component Value Date   VD25OH 79.9 11/06/2021   VD25OH 65.2 04/07/2021   VD25OH 61.9 11/06/2020    ASSOCIATED CONDITIONS ADDRESSED TODAY  ASSESSMENT AND PLAN  Problem List Items Addressed This Visit     Vitamin D deficiency    Vitamin D Deficiency Vitamin D is at goal of 86.  Most recent vitamin D level was 79. She is on  prescription ergocalciferol 50,000 IU every 14 days. Lab Results  Component Value Date   VD25OH 79.9 11/06/2021   VD25OH 65.2 04/07/2021   VD25OH 61.9 11/06/2020   Plan: Refill prescription vitamin D 50,000 IU every 14 days. Will plan to recheck vitamin D level next visit.        Relevant Medications   Vitamin D, Ergocalciferol, (DRISDOL) 1.25 MG (50000 UNIT) CAPS capsule   Insulin resistance    Insulin Resistance Last fasting insulin was 7.3. A1c was 5.5. Polyphagia:Yes Medication(s): Topiramate 25  mg daily at bedtime Lab Results  Component Value Date   HGBA1C 5.5 03/13/2022   HGBA1C 5.6 11/06/2021   HGBA1C 5.5 04/29/2021   HGBA1C 7.3 (H) 04/07/2021   HGBA1C 5.3 11/06/2020   Lab Results  Component Value Date   INSULIN 7.3 11/06/2021   INSULIN 3.6 04/07/2021   INSULIN 13.8 11/06/2020   INSULIN 11.6 07/10/2020   INSULIN 7.5 03/20/2020   Plan: Continue and increase dose Topiramate 50 mg at bedtime and monitor response.  Increase protein intake. Increase fiber intake.  Continue working on nutrition plan to decrease simple carbohydrates, increase lean proteins and exercise to promote weight loss, improve glycemic control and prevent progression to Type 2 diabetes.  Plan to recheck fasting labs next visit.         Essential hypertension    Hypertension Hypertension well controlled and no significant medication side effects noted.  Medication(s): HCTZ 25 mg daily.   BP Readings from Last 3 Encounters:  01/06/23 122/75  01/01/23 (!) 100/58   12/15/22 97/61   Lab Results  Component Value Date   CREATININE 0.83 07/14/2022   CREATININE 0.67 03/13/2022   CREATININE 0.79 12/09/2021  No results found for: "GFR"  Plan: Continue all antihypertensives at current dosages.Continue to work on nutrition plan to promote weight loss and improve BP control.  Check labs next visit.         Polyphagia - Primary    Polyphagia Betania has had issues with excessive hunger. This is currently moderately controlled. Medication(s): Topiramate 25 mg daily .  Was having some  tingling of extremities at 50 mg twice daily dosing.   Notes increased cravings. Will increase and dose at bedtime only and monitor response Plan: Continue and increase dose Topiramate 50 mg at bedtime and monitor closely.        Relevant Medications   topiramate (TOPAMAX) 25 MG tablet   Obesity ([place])- Start BMI 50.0   BMI 40.0-44.9, adult ([facility]) Current BMI 43.4   TREATMENT PLAN FOR OBESITY:  Recommended Dietary Goals  Morissa is currently in the action stage of change. As such, her goal is to continue weight management plan. She has agreed to the Category 2 Plan and keeping a food journal and adhering to recommended goals of 1200 calories and 75 + grams of  protein.  Behavioral Intervention  We discussed the following Behavioral Modification Strategies today: increasing lean protein intake, decreasing simple carbohydrates , increasing vegetables, increasing water intake, work on meal planning and easy cooking plans, work on Interior and spatial designer calories using tracking App, planning for success, and keeping healthy foods at home.  Additional resources provided today: NA  Recommended Physical Activity Goals  Dima has been advised to work up to 150 minutes of moderate intensity aerobic activity a week and strengthening exercises 2-3 times per week for cardiovascular health, weight loss maintenance and preservation of muscle mass.   She has agreed to  Continue current level of physical activity    Pharmacotherapy We discussed various medication options to help Achol with her weight loss efforts and we both agreed to increase topiramate to 50 mg at bedtime daily.    Return in about 4 weeks (around 02/03/2023) for Fasting Lab.[name] Kitchen She was informed of the importance of frequent follow up visits to maximize her success with intensive lifestyle modifications for her multiple health conditions.   ATTESTASTION STATEMENTS:  Reviewed by clinician on day of visit: allergies, medications, problem list, medical history, surgical history, family history, social history, and previous encounter notes.   I have personally spent 40 minutes total time today in preparation, patient care, nutritional counseling and documentation for this visit, including the following: review of clinical lab tests; review of medical tests/procedures/services.      Maxtyn Nuzum, PA-C

## 2023-01-06 NOTE — Assessment & Plan Note (Signed)
Vitamin D Deficiency Vitamin D is at goal of 50.  Most recent vitamin D level was 79. She is on  prescription ergocalciferol 50,000 IU every 14 days. Lab Results  Component Value Date   VD25OH 79.9 11/06/2021   VD25OH 65.2 04/07/2021   VD25OH 61.9 11/06/2020    Plan: Refill prescription vitamin D 50,000 IU every 14 days. Will plan to recheck vitamin D level next visit.

## 2023-01-06 NOTE — Assessment & Plan Note (Signed)
Polyphagia April [name] has had issues with excessive hunger. This is currently moderately controlled. Medication(s): Topiramate 25 mg daily .  Was having some tingling of extremities at 50 mg twice daily dosing.   Notes increased cravings. Will increase and dose at bedtime only and monitor response Plan: Continue and increase dose Topiramate 50 mg at bedtime and monitor closely.

## 2023-01-06 NOTE — Assessment & Plan Note (Addendum)
Hypertension Hypertension well controlled and no significant medication side effects noted.  Medication(s): HCTZ 25 mg daily.   BP Readings from Last 3 Encounters:  01/06/23 122/75  01/01/23 (!) 100/58  12/15/22 97/61   Lab Results  Component Value Date   CREATININE 0.83 07/14/2022   CREATININE 0.67 03/13/2022   CREATININE 0.79 12/09/2021   No results found for: "GFR"  Plan: Continue all antihypertensives at current dosages.Continue to work on nutrition plan to promote weight loss and improve BP control.  Check labs next visit.

## 2023-01-06 NOTE — Assessment & Plan Note (Signed)
Insulin Resistance Last fasting insulin was 7.3. A1c was 5.5. Polyphagia:Yes Medication(s): Topiramate 25  mg daily at bedtime Lab Results  Component Value Date   HGBA1C 5.5 03/13/2022   HGBA1C 5.6 11/06/2021   HGBA1C 5.5 04/29/2021   HGBA1C 7.3 (H) 04/07/2021   HGBA1C 5.3 11/06/2020   Lab Results  Component Value Date   INSULIN 7.3 11/06/2021   INSULIN 3.6 04/07/2021   INSULIN 13.8 11/06/2020   INSULIN 11.6 07/10/2020   INSULIN 7.5 03/20/2020    Plan: Continue and increase dose Topiramate 50 mg at bedtime and monitor response.  Increase protein intake. Increase fiber intake.  Continue working on nutrition plan to decrease simple carbohydrates, increase lean proteins and exercise to promote weight loss, improve glycemic control and prevent progression to Type 2 diabetes.  Plan to recheck fasting labs next visit.

## 2023-01-11 ENCOUNTER — Encounter: Payer: Self-pay | Admitting: Obstetrics and Gynecology

## 2023-01-11 ENCOUNTER — Other Ambulatory Visit: Payer: Medicaid Other | Admitting: Obstetrics and Gynecology

## 2023-01-11 NOTE — Patient Instructions (Signed)
Hi April [name], thanks for speaking with me-have a terrific day and week!  April [name] was given information about Medicaid Managed Care team care coordination services as a part of their Healthy [facility] Medicaid benefit. April [name] verbally consented to engagement with the [facility] Managed Care team.   If you are experiencing a medical emergency, please call 911 or report to your local emergency department or urgent care.   If you have a non-emergency medical problem during routine business hours, please contact your provider's office and ask to speak with a nurse.   For questions related to your Healthy [facility] health plan, please call: [phone] or visit the homepage here: [email or web address]  If you would like to schedule transportation through your Healthy [facility] plan, please call the following number at least 2 days in advance of your appointment: [phone]  For information about your ride after you set it up, call Ride Assist at [phone]. Use this number to activate a Will Call pickup, or if your transportation is late for a scheduled pickup. Use this number, too, if you need to make a change or cancel a previously scheduled reservation.  If you need transportation services right away, call [phone]. The after-hours call center is staffed 24 hours to handle ride assistance and urgent reservation requests (including discharges) 365 days a year. Urgent trips include sick visits, hospital discharge requests and life-sustaining treatment.  Call the [place] at [phone], at any time, 24 hours a day, 7 days a week. If you are in danger or need immediate medical attention call 911.  If you would like help to quit smoking, call 1-800-QUIT-NOW 639-651-4361) OR Espaol: 1-855-Djelo-Ya QO:409462) o para ms informacin haga clic aqu or Text READY to 200-400 to register via text  April [name] -  following are the goals we discussed in your visit today:   Goals Addressed    Timeframe:  Long-Range Goal Priority:  High Start Date:         05/22/22                    Expected End Date:  ongoing                   Follow Up 04/12/23   - practice safe sex - schedule appointment for flu shot - schedule appointment for vaccines needed due to my age or health - schedule recommended health tests (blood work, mammogram, colonoscopy, pap test) - schedule and keep appointment for annual check-up    Why is this important?   Screening tests can find diseases early when they are easier to treat.  Your doctor or nurse will talk with you about which tests are important for you.  Getting shots for common diseases like the flu and shingles will help prevent them.  01/11/23:   follows with [facility] month, PCP 3/22  Patient verbalizes understanding of instructions and care plan provided today and agrees to view in [place]. Active MyChart status and patient understanding of how to access instructions and care plan via MyChart confirmed with patient.     The Managed Medicaid care management team will reach out to the patient again over the next 90 business  days.  The  Patient  has been provided with contact information for the Managed Medicaid care management team and has been advised to call with any health related questions or concerns.   [name] RN, BSN [place]  [facility] Management Coordinator - Managed Medicaid High Risk [phone]   Following is a copy of your plan of care:  Care Plan : [facility] [place] [facility]  Updates made by April [name], RN since 01/11/2023 12:00 AM     Problem: Health Promotion or Disease Self-Management (General Plan of Care)      Long-Range Goal: Chronic Disease Management and Care Coordination Needs/SDOH   Start Date: 05/22/2022  Expected End Date: 01/18/2023  Priority: High  Note:   Current Barriers:   Care Coordination needs related to Limited access to food resources and utility resources-completed. Chronic Disease Management HTN, GERD, osteoarthritis, low back pain, obesity, anxiety/depression, delusional disorder. 01/11/23:  Patient with no complaints today, BP stable and continues with [facility].  RNCM Clinical Goal(s):  Patient will take all medications exactly as prescribed and will call provider for medication related questions as evidenced by patient report demonstrate understanding of rationale for each prescribed medication as evidenced by patient report attend all scheduled medical appointments as evidenced by patient report continue to work with RN Care Manager to address care management and care coordination needs related to SDOH. HTN, GERD, osteoarthritis, LBP, obesity, anxiety/depression as evidenced by adherence to CM Team Scheduled appointments work with [employer] care guide to address needs related to  Limited access to food and utility resources as evidenced by patient and/or community resource care guide support through collaboration with Consulting civil engineer, provider, and care team.   Interventions: Inter-disciplinary care team collaboration (see longitudinal plan of care) Evaluation of current treatment plan related to  self management and patient's adherence to plan as established by provider Care Guide referral for utility resources and food resources-completed. Collaborated with Care Guide for food and utility resources  Hypertension Interventions:  (Status:  New goal.) Long Term Goal Last practice recorded BP readings:  BP Readings from Last 3 Encounters:  09/08/22 119/69  08/25/22 119/83  08/13/22 118/75  10/08/22          117/9 01/06/23         122/75 Most recent eGFR/CrCl:  Lab Results  Component Value Date   EGFR 81 07/14/2022    No components found for: "CRCL"  Evaluation of current treatment plan related to hypertension self  management and patient's adherence to plan as established by provider Reviewed medications with patient and discussed importance of compliance Discussed plans with patient for ongoing care management follow up and provided patient with direct contact information for care management team Advised patient, providing education and rationale, to monitor blood pressure daily and record, calling PCP for findings outside established parameters Reviewed scheduled/upcoming provider appointments  Assessed social determinant of health barriers  SDOH Barriers (Status:  New goal.) Long Term Goal Patient interviewed and SDOH assessment performed        SDOH Interventions    Flowsheet Row Most Recent Value  SDOH Interventions   Food Insecurity Interventions Other (Comment)  [Care Guide referral]  Transportation Interventions Intervention Not Indicated     Patient interviewed and appropriate assessments performed Referred patient to community resources care guide team for assistance with food and utility resources. Discussed plans with patient for ongoing care management follow up and provided patient with direct contact information for care management team  Patient Goals/Self-Care Activities: Take all medications as prescribed Attend all scheduled provider appointments Call pharmacy for medication refills 3-7 days in advance of running out of medications Perform all self care activities independently  Perform IADL's (  shopping, preparing meals, housekeeping, managing finances) independently Call provider office for new concerns or questions  Patient will contact [facility] [employer]  Follow Up Plan:  The patient has been provided with contact information for the care management team and has been advised to call with any health related questions or concerns.  The care management team will reach out to the patient again over the next 90 business  days.

## 2023-01-11 NOTE — Patient Outreach (Signed)
Medicaid Managed Care   Nurse Care Manager Note  01/11/2023 Name:  April [name] MRN:  [id number] DOB:  1961-08-22  April [name] is an 62 y.o. year old female who is a primary patient of Hanna, [place], DO.  The [facility] Managed Care Coordination team was consulted for assistance with:    Chronic healthcare management needs, HTN, GERD, osteoarthritis, anxiety/depression  April [name] was given information about Medicaid Managed Care Coordination team services today. [name] Patient agreed to services and verbal consent obtained.  Engaged with patient by telephone for follow up visit in response to provider referral for case management and/or care coordination services.   Assessments/Interventions:  Review of past medical history, allergies, medications, health status, including review of consultants reports, laboratory and other test data, was performed as part of comprehensive evaluation and provision of chronic care management services.  SDOH (Social Determinants of Health) assessments and interventions performed: SDOH Interventions    Flowsheet Row Patient Outreach Telephone from 01/11/2023 in [place] Patient Outreach Telephone from 11/13/2022 in [place] Patient Outreach Telephone from 10/19/2022 in [place] Patient Outreach Telephone from 09/14/2022 in [place] Patient Outreach Telephone from 08/05/2022 in [place] Patient Outreach Telephone from 07/02/2022 in [place] Coordination  SDOH Interventions        Food Insecurity Interventions -- Intervention Not Indicated -- -- -- --  Housing Interventions -- -- -- Intervention Not Indicated -- --  Transportation Interventions -- -- Intervention Not Indicated -- -- --  Utilities Interventions Other (Comment)  [patient has resources]  -- -- -- -- Ambulatory [id number] Order  [resources received in August]  Alcohol Usage Interventions -- -- -- -- -- Intervention Not Indicated (Score <7)  Financial Strain Interventions -- Intervention Not Indicated -- -- -- --  Physical Activity Interventions -- -- Intervention Not Indicated -- -- --  Stress Interventions -- -- -- -- Provide Counseling  [sees therapist] --     Care Plan  Allergies  Allergen Reactions   Percocet [Oxycodone-Acetaminophen] Swelling   Naproxen Palpitations    Medications Reviewed Today     Reviewed by [name], RN (Registered Nurse) on 01/11/23 at 1035  Med List Status: <None>   Medication Order Taking? Sig Documenting Provider Last Dose Status Informant  benztropine (COGENTIN) 1 MG tablet [id number] No Take 1 tablet (1 mg total) by mouth daily. Freida Busman, MD Taking Active   cetirizine (ZYRTEC) 10 MG tablet [id number] No Take 1 tablet (10 mg total) by mouth daily.  Patient not taking: Reported on 12/15/2022   Talbot Grumbling, FNP Not Taking Active   diclofenac Sodium (VOLTAREN) 1 % GEL [id number] No APPLY 2 GRAMS TOPICALLY TO THE AFFECTED AREA FOUR TIMES DAILY  Patient taking differently: 2 g 4 (four) times daily as needed (Apply to affected area for pain).   Donney Dice, DO Taking Active Self  haloperidol (HALDOL) 1 MG tablet [id number] No Take 1 tablet (1 mg total) by mouth 2 (two) times daily. Freida Busman, MD Taking Active   hydrochlorothiazide (HYDRODIURIL) 25 MG tablet [id number] No Take 1 tablet (25 mg total) by mouth daily. Bowen, Collene Leyden, DO Taking Active   topiramate (TOPAMAX) 25 MG tablet [id number]  Take 2 tablets (50 mg total) by mouth at bedtime. [name], Neta Mends, PA-C  Active   traMADol (ULTRAM) 50 MG tablet [id number] No Take 1 tablet (  50 mg total) by mouth daily. Donney Dice, DO Taking Active   Vitamin D, Ergocalciferol, (DRISDOL) 1.25 MG (50000 UNIT) CAPS capsule [id number]  One Capsule by mouth every 14 days [name],  Neta Mends, PA-C  Active            Patient Active Problem List   Diagnosis Date Noted   Obesity ([place])- Start BMI 50.0 01/06/2023   BMI 40.0-44.9, adult ([place]) Current BMI 43.4 01/06/2023   Vaginal discharge 11/23/2022   Osteoarthritis of left knee 11/02/2022   Left knee pain 08/25/2022   Polyphagia 08/13/2022   Callus 07/23/2022   Benign nevus of plantar aspect of foot 06/29/2022   Delusional disorder 03/14/2022   Pap smear for cervical cancer screening 09/16/2021   Tension headache 01/01/2021   Essential hypertension 08/15/2020   Insulin resistance 07/11/2019   Class 3 severe obesity with serious comorbidity and body mass index (BMI) of 50.0 to 59.9 in adult 07/11/2019   Vitamin D deficiency 03/15/2019   Vitiligo 03/03/2018   Insomnia 11/05/2017   Fatigue 03/02/2016   Osteoarthritis of knees, bilateral 09/23/2015   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   Low back pain 01/27/2012   GERD 03/22/2009   Anxiety state 11/14/2008   Eczema 05/18/2008   Depression 03/10/2007   HYPERCHOLESTEROLEMIA 12/09/2006   Anemia 12/09/2006   Conditions to be addressed/monitored per PCP order:  Chronic healthcare management needs, HTN, GERD, osteoarthritis, anxiety/depression  Care Plan : RN Care Manager Plan of Care  Updates made by [name], RN since 01/11/2023 12:00 AM     Problem: Health Promotion or Disease Self-Management (General Plan of Care)      Long-Range Goal: Chronic Disease Management and Care Coordination Needs/SDOH   Start Date: 05/22/2022  Expected End Date: 01/18/2023  Priority: High  Note:   Current Barriers:  Care Coordination needs related to Limited access to food resources and utility resources-completed. Chronic Disease Management HTN, GERD, osteoarthritis, low back pain, obesity, anxiety/depression, delusional disorder. 01/11/23:  Patient with no complaints today, BP stable and continues with [facility].  RNCM Clinical Goal(s):   Patient will take all medications exactly as prescribed and will call provider for medication related questions as evidenced by patient report demonstrate understanding of rationale for each prescribed medication as evidenced by patient report attend all scheduled medical appointments as evidenced by patient report continue to work with RN Care Manager to address care management and care coordination needs related to SDOH. HTN, GERD, osteoarthritis, LBP, obesity, anxiety/depression as evidenced by adherence to CM Team Scheduled appointments work with [employer] care guide to address needs related to  Limited access to food and utility resources as evidenced by patient and/or community resource care guide support through collaboration with Consulting civil engineer, provider, and care team.   Interventions: Inter-disciplinary care team collaboration (see longitudinal plan of care) Evaluation of current treatment plan related to  self management and patient's adherence to plan as established by provider Care Guide referral for utility resources and food resources-completed. Collaborated with Care Guide for food and utility resources  Hypertension Interventions:  (Status:  New goal.) Long Term Goal Last practice recorded BP readings:  BP Readings from Last 3 Encounters:  09/08/22 119/69  08/25/22 119/83  08/13/22 118/75  10/08/22          117/9 01/06/23         122/75 Most recent eGFR/CrCl:  Lab Results  Component Value Date   EGFR 81 07/14/2022    No  components found for: "CRCL"  Evaluation of current treatment plan related to hypertension self management and patient's adherence to plan as established by provider Reviewed medications with patient and discussed importance of compliance Discussed plans with patient for ongoing care management follow up and provided patient with direct contact information for care management team Advised patient, providing education and rationale, to monitor  blood pressure daily and record, calling PCP for findings outside established parameters Reviewed scheduled/upcoming provider appointments  Assessed social determinant of health barriers  SDOH Barriers (Status:  New goal.) Long Term Goal Patient interviewed and SDOH assessment performed        SDOH Interventions    Flowsheet Row Most Recent Value  SDOH Interventions   Food Insecurity Interventions Other (Comment)  [Care Guide referral]  Transportation Interventions Intervention Not Indicated     Patient interviewed and appropriate assessments performed Referred patient to community resources care guide team for assistance with food and utility resources. Discussed plans with patient for ongoing care management follow up and provided patient with direct contact information for care management team  Patient Goals/Self-Care Activities: Take all medications as prescribed Attend all scheduled provider appointments Call pharmacy for medication refills 3-7 days in advance of running out of medications Perform all self care activities independently  Perform IADL's (shopping, preparing meals, housekeeping, managing finances) independently Call provider office for new concerns or questions  Patient will contact [facility] [employer]  Follow Up Plan:  The patient has been provided with contact information for the care management team and has been advised to call with any health related questions or concerns.  The care management team will reach out to the patient again over the next 90 business  days.     Long-Range Goal: Establish Plan of Care for Chronic Disease Management Needs/SDOH   Priority: High  Note:   Timeframe:  Long-Range Goal Priority:  High Start Date:         05/22/22                    Expected End Date:  ongoing                   Follow Up 04/12/23   - practice safe sex - schedule appointment for flu shot - schedule appointment for vaccines needed due to my age  or health - schedule recommended health tests (blood work, mammogram, colonoscopy, pap test) - schedule and keep appointment for annual check-up    Why is this important?   Screening tests can find diseases early when they are easier to treat.  Your doctor or nurse will talk with you about which tests are important for you.  Getting shots for common diseases like the flu and shingles will help prevent them.  01/11/23:   follows with [facility], PCP 3/22   Follow Up:  Patient agrees to Care Plan and Follow-up.  Plan: The Managed Medicaid care management team will reach out to the patient again over the next 90 business  days. and The  Patient has been provided with contact information for the Managed Medicaid care management team and has been advised to call with any health related questions or concerns.  Date/time of next scheduled RN care management/care coordination outreach: 04/12/23 at 0900

## 2023-01-14 DIAGNOSIS — F418 Other specified anxiety disorders: Secondary | ICD-10-CM | POA: Diagnosis not present

## 2023-01-27 ENCOUNTER — Other Ambulatory Visit: Payer: Self-pay

## 2023-01-27 MED ORDER — TRAMADOL HCL 50 MG PO TABS: 50.0000 mg | ORAL_TABLET | Freq: Every day | ORAL | 0 refills | Status: DC

## 2023-01-28 DIAGNOSIS — F418 Other specified anxiety disorders: Secondary | ICD-10-CM | POA: Diagnosis not present

## 2023-02-03 ENCOUNTER — Ambulatory Visit (INDEPENDENT_AMBULATORY_CARE_PROVIDER_SITE_OTHER): Payer: Medicaid Other | Admitting: Family Medicine

## 2023-02-03 ENCOUNTER — Encounter (INDEPENDENT_AMBULATORY_CARE_PROVIDER_SITE_OTHER): Payer: Self-pay | Admitting: Family Medicine

## 2023-02-03 VITALS — BP 124/88 | HR 61 | Temp 98.0°F | Ht 60.0 in | Wt 219.0 lb

## 2023-02-03 DIAGNOSIS — E781 Pure hyperglyceridemia: Secondary | ICD-10-CM

## 2023-02-03 DIAGNOSIS — E88819 Insulin resistance, unspecified: Secondary | ICD-10-CM | POA: Diagnosis not present

## 2023-02-03 DIAGNOSIS — E7849 Other hyperlipidemia: Secondary | ICD-10-CM

## 2023-02-03 DIAGNOSIS — E559 Vitamin D deficiency, unspecified: Secondary | ICD-10-CM

## 2023-02-03 DIAGNOSIS — Z6841 Body Mass Index (BMI) 40.0 and over, adult: Secondary | ICD-10-CM

## 2023-02-03 DIAGNOSIS — R632 Polyphagia: Secondary | ICD-10-CM

## 2023-02-03 DIAGNOSIS — I10 Essential (primary) hypertension: Secondary | ICD-10-CM

## 2023-02-03 DIAGNOSIS — E669 Obesity, unspecified: Secondary | ICD-10-CM | POA: Diagnosis not present

## 2023-02-03 MED ORDER — VITAMIN D (ERGOCALCIFEROL) 1.25 MG (50000 UNIT) PO CAPS: ORAL_CAPSULE | ORAL | 0 refills | Status: DC

## 2023-02-03 NOTE — Progress Notes (Unsigned)
Chief Complaint:   OBESITY April [name] is here to discuss her progress with her obesity treatment plan along with follow-up of her obesity related diagnoses. April [name] is on the Category 2 Plan and states she is following her eating plan approximately 85% of the time. April [name] states she is walking and at the [facility] for various minutes 3-4 times per week.    Today's visit was #: 60 Starting weight: 256 lbs Starting date: 06/13/2019 Today's weight: 219 lbs Today's date: 02/03/2023 Total lbs lost to date: 37 Total lbs lost since last in-office visit: 3  Interim History: April [name] has done well with her weight loss over the last month. She is working on following her plan and she has increased her exercise.   Subjective:   1. Insulin resistance April [name] is working on her diet, and she is due for labs. She denies hypoglycemia.   2. Hyperlipidemia, pure April [name] is not on statin, she is working on decreasing cholesterol in her diet and she is due for labs.   3. Vitamin D deficiency April [name] is on Vitamin D, and she is due to have labs done.   Assessment/Plan:   1. Insulin resistance We will check labs today. April [name] will continue with her diet and exercise.   - CMP14+EGFR - Vitamin B12 - Hemoglobin A1c - Insulin, random  2. Hyperlipidemia, pure We will check labs today. April [name] will continue with her diet and exercise.   - CBC with Differential/Platelet - Lipid Panel With LDL/HDL Ratio - TSH  3. Vitamin D deficiency We will check labs today, and we will refill prescription Vitamin D for 1 month.   - VITAMIN D 25 Hydroxy (Vit-D Deficiency, Fractures) - Vitamin D, Ergocalciferol, (DRISDOL) 1.25 MG (50000 UNIT) CAPS capsule; One Capsule by mouth every 14 days  Dispense: 4 capsule; Refill: 0  4. Obesity (H[facility])- Start BMI 50.0 April [name] is currently in the action stage of change. As such, her goal is to continue with weight loss efforts. She has agreed to the Category 2 Plan.    Exercise goals: As is.   Behavioral modification strategies: increasing lean protein intake.  April [name] has agreed to follow-up with our clinic in 4 weeks. She was informed of the importance of frequent follow-up visits to maximize her success with intensive lifestyle modifications for her multiple health conditions.   April [name] was informed we would discuss her lab results at her next visit unless there is a critical issue that needs to be addressed sooner. April [name] agreed to keep her next visit at the agreed upon time to discuss these results.  Objective:   Blood pressure 124/88, pulse 61, temperature 98 F (36.7 C), height 5' (1.524 m), weight 219 lb (99.3 kg), SpO2 98 %. Body mass index is 42.77 kg/m.  Lab Results  Component Value Date   CREATININE 0.83 07/14/2022   BUN 12 07/14/2022   NA 141 07/14/2022   K 4.9 07/14/2022   CL 102 07/14/2022   CO2 24 07/14/2022   Lab Results  Component Value Date   ALT 14 07/14/2022   AST 18 07/14/2022   ALKPHOS 67 07/14/2022   BILITOT 0.3 07/14/2022   Lab Results  Component Value Date   HGBA1C 5.5 03/13/2022   HGBA1C 5.6 11/06/2021   HGBA1C 5.5 04/29/2021   HGBA1C 7.3 (H) 04/07/2021   HGBA1C 5.3 11/06/2020   Lab Results  Component Value Date   INSULIN 7.3 11/06/2021   INSULIN 3.6 04/07/2021   INSULIN 13.8 11/06/2020   INSULIN  11.6 07/10/2020   INSULIN 7.5 03/20/2020   Lab Results  Component Value Date   TSH 0.949 03/13/2022   Lab Results  Component Value Date   CHOL 198 03/13/2022   HDL 52 03/13/2022   LDLCALC 129 (H) 03/13/2022   LDLDIRECT 153 (H) 08/16/2020   TRIG 87 03/13/2022   CHOLHDL 3.8 03/13/2022   Lab Results  Component Value Date   VD25OH 79.9 11/06/2021   VD25OH 65.2 04/07/2021   VD25OH 61.9 11/06/2020   Lab Results  Component Value Date   WBC 8.1 03/13/2022   HGB 14.0 03/13/2022   HCT 44.0 03/13/2022   MCV 89.8 03/13/2022   PLT 296 03/13/2022   Lab Results  Component Value Date   IRON 36  (L) 12/11/2009   TIBC 318 12/11/2009   FERRITIN 19 12/11/2009   Attestation Statements:   Reviewed by clinician on day of visit: allergies, medications, problem list, medical history, surgical history, family history, social history, and previous encounter notes.   I, Burt Knack, am acting as transcriptionist for Quillian Quince, MD.  I have reviewed the above documentation for accuracy and completeness, and I agree with the above. -  Quillian Quince, MD

## 2023-02-04 ENCOUNTER — Encounter: Payer: Self-pay | Admitting: Family Medicine

## 2023-02-04 ENCOUNTER — Telehealth (INDEPENDENT_AMBULATORY_CARE_PROVIDER_SITE_OTHER): Payer: Self-pay | Admitting: Family Medicine

## 2023-02-04 ENCOUNTER — Ambulatory Visit: Payer: Medicaid Other | Admitting: Family Medicine

## 2023-02-04 VITALS — BP 126/90 | HR 65 | Ht 60.0 in | Wt 222.4 lb

## 2023-02-04 DIAGNOSIS — I10 Essential (primary) hypertension: Secondary | ICD-10-CM

## 2023-02-04 DIAGNOSIS — Z0100 Encounter for examination of eyes and vision without abnormal findings: Secondary | ICD-10-CM | POA: Diagnosis not present

## 2023-02-04 DIAGNOSIS — Z7689 Persons encountering health services in other specified circumstances: Secondary | ICD-10-CM

## 2023-02-04 NOTE — Patient Instructions (Addendum)
It was great seeing you today!  Today we discussed many things. Please continue to take your hydrochlorothiazide.   Regarding your sleep, I am worried that you may have sleep apnea so we will get a sleep study.   I have also placed another referral to the eye doctor, they should be in touch with you within the next few weeks.   Please follow up at your next scheduled appointment on 6/27 at 9:30 am, if anything arises between now and then, please don't hesitate to contact our office.   Thank you for allowing [place] to be a part of your medical care!  Thank you, Dr. Robyne Peers

## 2023-02-04 NOTE — Progress Notes (Signed)
    SUBJECTIVE:   CHIEF COMPLAINT / HPI:   Patient presents with concern of needing a new eye doctor. Previously went to [facility], they scheduled her to have an eye exam but the person that does this is no longer their so she would like to see someone else. Denies blurry vision or other vision changes. Last saw the eye doctor a few months ago but it was not an annual vision screen at the time.   Also concerned about her sleep. She says that she falls asleep easily at night but then when she happens to wake up she has trouble returning back to sleep. This has been an issue for about a year. She is unsure, but does not believe that she snores. Feels tired during the daytime. She does not recall episodes where she has stopped breathing during her sleep. Avoids naps. Gets about 5 hours of sleep.   History of hypertension, compliant HCTZ daily. Denies chest pain, dyspnea, leg swelling or other symptoms. Continues to maintain routine follow up with health weight and wellness.   OBJECTIVE:   BP (!) 126/90   Pulse 65   Ht 5' (1.524 m)   Wt 222 lb 6.4 oz (100.9 kg)   SpO2 100%   BMI 43.43 kg/m   General: Patient well-appearing, in no acute distress. CV: RRR, no murmurs or gallops auscultated Resp: CTAB, no wheezing, rales or rhonchi noted  Ext: no LE edema noted bilaterally Psych: mood appropriate, pleasant   ASSESSMENT/PLAN:   Sleep concern -concern for sleep apnea, STOP BANG score of 3 places patient within intermediate risk of OSA. Sleep study ordered to evaluate for this. -sleep hygiene discussed -follow up scheduled in 2 months   Essential hypertension -BP at goal, continue HCTZ daily -diet and exercise counseling -follow up in 2 months, plan to obtain lipid panel, A1c, BMP at that time  Annual eye exam -referral placed to ophthalmology so that patient can establish care and maintain annual follow up   -PHQ-9 score of 3 with negative question 9 reviewed.     [name], DO [facility]

## 2023-02-04 NOTE — Assessment & Plan Note (Signed)
-  concern for sleep apnea, STOP BANG score of 3 places patient within intermediate risk of OSA. Sleep study ordered to evaluate for this. -sleep hygiene discussed -follow up scheduled in 2 months

## 2023-02-04 NOTE — Telephone Encounter (Signed)
Call to patient today.  Explained that the labs are in Epic and her PCP should be able to see them since they are a [facility] provider.  Patient wrote the labs down and will remind her PCP to look at what she needs.  No further needs today.  Call ended.

## 2023-02-04 NOTE — Assessment & Plan Note (Signed)
-  BP at goal, continue HCTZ daily -diet and exercise counseling -follow up in 2 months, plan to obtain lipid panel, A1c, BMP at that time

## 2023-02-04 NOTE — Telephone Encounter (Signed)
Patient wants a hard copy of her lab orders in order to have her labs drawn at [facility]. Pt was here yesterday, but we were not able to get blood from her.

## 2023-02-05 DIAGNOSIS — E88819 Insulin resistance, unspecified: Secondary | ICD-10-CM | POA: Diagnosis not present

## 2023-02-05 DIAGNOSIS — E7849 Other hyperlipidemia: Secondary | ICD-10-CM | POA: Diagnosis not present

## 2023-02-05 DIAGNOSIS — E559 Vitamin D deficiency, unspecified: Secondary | ICD-10-CM | POA: Diagnosis not present

## 2023-02-06 LAB — VITAMIN B12: Vitamin B-12: 929 pg/mL (ref 232–1245)

## 2023-02-06 LAB — CMP14+EGFR
ALT: 9 IU/L (ref 0–32)
AST: 17 IU/L (ref 0–40)
Albumin/Globulin Ratio: 1.3 (ref 1.2–2.2)
Albumin: 4.4 g/dL (ref 3.8–4.9)
Alkaline Phosphatase: 151 IU/L — ABNORMAL HIGH (ref 44–121)
BUN/Creatinine Ratio: 30 — ABNORMAL HIGH (ref 12–28)
BUN: 19 mg/dL (ref 8–27)
Bilirubin Total: 0.3 mg/dL (ref 0.0–1.2)
CO2: 19 mmol/L — ABNORMAL LOW (ref 20–29)
Calcium: 9.5 mg/dL (ref 8.7–10.3)
Chloride: 103 mmol/L (ref 96–106)
Creatinine, Ser: 0.63 mg/dL (ref 0.57–1.00)
Globulin, Total: 3.3 g/dL (ref 1.5–4.5)
Glucose: 82 mg/dL (ref 70–99)
Potassium: 4.2 mmol/L (ref 3.5–5.2)
Sodium: 136 mmol/L (ref 134–144)
Total Protein: 7.7 g/dL (ref 6.0–8.5)
eGFR: 101 mL/min/{1.73_m2} (ref >59)

## 2023-02-06 LAB — LIPID PANEL WITH LDL/HDL RATIO
Cholesterol, Total: 200 mg/dL — ABNORMAL HIGH (ref 100–199)
HDL: 58 mg/dL (ref >39)
LDL Chol Calc (NIH): 130 mg/dL — ABNORMAL HIGH (ref 0–99)
LDL/HDL Ratio: 2.2 ratio (ref 0.0–3.2)
Triglycerides: 68 mg/dL (ref 0–149)
VLDL Cholesterol Cal: 12 mg/dL (ref 5–40)

## 2023-02-06 LAB — CBC WITH DIFFERENTIAL/PLATELET
Basophils Absolute: 0 10*3/uL (ref 0.0–0.2)
Basos: 0 %
EOS (ABSOLUTE): 0.1 10*3/uL (ref 0.0–0.4)
Eos: 2 %
Hematocrit: 41.8 % (ref 34.0–46.6)
Hemoglobin: 13.7 g/dL (ref 11.1–15.9)
Immature Grans (Abs): 0 10*3/uL (ref 0.0–0.1)
Immature Granulocytes: 0 %
Lymphocytes Absolute: 3.1 10*3/uL (ref 0.7–3.1)
Lymphs: 48 %
MCH: 28.1 pg (ref 26.6–33.0)
MCHC: 32.8 g/dL (ref 31.5–35.7)
MCV: 86 fL (ref 79–97)
Monocytes Absolute: 0.6 10*3/uL (ref 0.1–0.9)
Monocytes: 9 %
Neutrophils Absolute: 2.6 10*3/uL (ref 1.4–7.0)
Neutrophils: 41 %
Platelets: 341 10*3/uL (ref 150–450)
RBC: 4.87 x10E6/uL (ref 3.77–5.28)
RDW: 14.1 % (ref 11.7–15.4)
WBC: 6.3 10*3/uL (ref 3.4–10.8)

## 2023-02-06 LAB — HEMOGLOBIN A1C
Est. average glucose Bld gHb Est-mCnc: 120 mg/dL
Hgb A1c MFr Bld: 5.8 % — ABNORMAL HIGH (ref 4.8–5.6)

## 2023-02-06 LAB — INSULIN, RANDOM: INSULIN: 9.1 u[IU]/mL (ref 2.6–24.9)

## 2023-02-06 LAB — TSH: TSH: 1.41 u[IU]/mL (ref 0.450–4.500)

## 2023-02-06 LAB — VITAMIN D 25 HYDROXY (VIT D DEFICIENCY, FRACTURES): Vit D, 25-Hydroxy: 60 ng/mL (ref 30.0–100.0)

## 2023-02-23 DIAGNOSIS — M1712 Unilateral primary osteoarthritis, left knee: Secondary | ICD-10-CM | POA: Diagnosis not present

## 2023-03-03 ENCOUNTER — Encounter (INDEPENDENT_AMBULATORY_CARE_PROVIDER_SITE_OTHER): Payer: Self-pay | Admitting: Physician Assistant

## 2023-03-03 ENCOUNTER — Ambulatory Visit (INDEPENDENT_AMBULATORY_CARE_PROVIDER_SITE_OTHER): Payer: Medicaid Other | Admitting: Physician Assistant

## 2023-03-03 VITALS — BP 124/85 | HR 64 | Temp 98.3°F | Ht 60.0 in | Wt 218.0 lb

## 2023-03-03 DIAGNOSIS — E669 Obesity, unspecified: Secondary | ICD-10-CM

## 2023-03-03 DIAGNOSIS — I10 Essential (primary) hypertension: Secondary | ICD-10-CM

## 2023-03-03 DIAGNOSIS — Z6841 Body Mass Index (BMI) 40.0 and over, adult: Secondary | ICD-10-CM | POA: Diagnosis not present

## 2023-03-03 DIAGNOSIS — R7303 Prediabetes: Secondary | ICD-10-CM | POA: Diagnosis not present

## 2023-03-03 DIAGNOSIS — E7849 Other hyperlipidemia: Secondary | ICD-10-CM

## 2023-03-03 DIAGNOSIS — E559 Vitamin D deficiency, unspecified: Secondary | ICD-10-CM | POA: Diagnosis not present

## 2023-03-03 DIAGNOSIS — E88819 Insulin resistance, unspecified: Secondary | ICD-10-CM

## 2023-03-03 HISTORY — DX: Other hyperlipidemia: E78.49

## 2023-03-03 MED ORDER — VITAMIN D (ERGOCALCIFEROL) 1.25 MG (50000 UNIT) PO CAPS: ORAL_CAPSULE | ORAL | 0 refills | Status: DC

## 2023-03-03 NOTE — Progress Notes (Signed)
.smr  Office: [phone]  /  Fax: [phone]  WEIGHT SUMMARY AND BIOMETRICS  Vitals Temp: 98.3 F (36.8 C) BP: 124/85 Pulse Rate: 64 SpO2: 98 %   Anthropometric Measurements Height: 5' (1.524 m) Weight: 218 lb (98.9 kg) BMI (Calculated): 42.58 Weight at Last Visit: 219 lb Weight Lost Since Last Visit: 1 lb Weight Gained Since Last Visit: 0 Starting Weight: 256 lb   Body Composition  Body Fat %: 44.8 % Fat Mass (lbs): 97.6 lbs Muscle Mass (lbs): 114.4 lbs Total Body Water (lbs): 74.4 lbs Visceral Fat Rating : 15   Other Clinical Data Fasting: No Labs: No Today's Visit #: 26 Starting Date: 06/13/19     HPI  Chief Complaint: OBESITY  April [name] is here to discuss her progress with her obesity treatment plan. She is on the the Category 2 Plan and states she is following her eating plan approximately 85 % of the time. She states she is exercising/walking/[employer] 40 minutes 1 times per week.   Interval History:  Since last office visit she is down 1 lb Bio impedence scale: Up 1.6 lbs muscle mass, down 3.2 lbs adipose mass Journaling and feels she is meeting protein goals more consistently.   Hunger/appetite-well controlled overall. Cravings- denies excessive cravings Stress- manageable Sleep- better lately, but has sleep evaluation scheduled.  Exercise-walking and going to [facility] 1-2 days a week and discussed increasing to 3 days/wk Hydration-Doing better drinking more water   She is journaling intake and activities and getting more comfortable with this.   Pharmacotherapy: topamax 25 mg at bedtime nightly. Some daytime fatigue and discussed staying at 25 mg dose and monitor. No other side effects with topamax.   TREATMENT PLAN FOR OBESITY:  Recommended Dietary Goals  April [name] is currently in the action stage of change. As such, her goal is to continue weight management plan. She has agreed to the Category 2 Plan.  Behavioral Intervention  We discussed  the following Behavioral Modification Strategies today: increasing lean protein intake, decreasing simple carbohydrates , increasing vegetables, increasing lower glycemic fruits, increasing water intake, work on tracking and journaling calories using tracking application, continue to practice mindfulness when eating, and planning for success.  Additional resources provided today: NA  Recommended Physical Activity Goals  April [name] has been advised to work up to 150 minutes of moderate intensity aerobic activity a week and strengthening exercises 2-3 times per week for cardiovascular health, weight loss maintenance and preservation of muscle mass.   She has agreed to Continue current level of physical activity  and Increase the intensity, frequency or duration of aerobic exercises     Pharmacotherapy We discussed various medication options to help April [name] with her weight loss efforts and we both agreed to continue to work on nutritional and behavioral strategies to promote weight loss and continue topamax 25 mg at bedtime for emotional eating/cravings.      Return in about 4 weeks (around 03/31/2023).[name] Kitchen She was informed of the importance of frequent follow up visits to maximize her success with intensive lifestyle modifications for her multiple health conditions.  PHYSICAL EXAM:  Blood pressure 124/85, pulse 64, temperature 98.3 F (36.8 C), height 5' (1.524 m), weight 218 lb (98.9 kg), SpO2 98 %. Body mass index is 42.58 kg/m.  General: She is overweight, cooperative, alert, well developed, and in no acute distress. PSYCH: Has normal mood, affect and thought process.   Cardiovascular: HR 60's regular Lungs: Normal breathing effort, no conversational dyspnea. Neuro: no focal deficits  DIAGNOSTIC DATA  REVIEWED:  BMET    Component Value Date/Time   NA 136 02/05/2023 1004   K 4.2 02/05/2023 1004   CL 103 02/05/2023 1004   CO2 19 (L) 02/05/2023 1004   GLUCOSE 82 02/05/2023 1004    GLUCOSE 93 03/13/2022 1548   BUN 19 02/05/2023 1004   CREATININE 0.63 02/05/2023 1004   CREATININE 0.87 03/02/2016 1137   CALCIUM 9.5 02/05/2023 1004   GFRNONAA >60 03/13/2022 1548   GFRNONAA 76 03/02/2016 1137   GFRAA 111 11/06/2020 0907   GFRAA 88 03/02/2016 1137   Lab Results  Component Value Date   HGBA1C 5.8 (H) 02/05/2023   HGBA1C 5.5 12/01/2012   Lab Results  Component Value Date   INSULIN 9.1 02/05/2023   INSULIN 9.5 06/13/2019   Lab Results  Component Value Date   TSH 1.410 02/05/2023   CBC    Component Value Date/Time   WBC 6.3 02/05/2023 1004   WBC 8.1 03/13/2022 1548   RBC 4.87 02/05/2023 1004   RBC 4.90 03/13/2022 1548   HGB 13.7 02/05/2023 1004   HCT 41.8 02/05/2023 1004   PLT 341 02/05/2023 1004   MCV 86 02/05/2023 1004   MCH 28.1 02/05/2023 1004   MCH 28.6 03/13/2022 1548   MCHC 32.8 02/05/2023 1004   MCHC 31.8 03/13/2022 1548   RDW 14.1 02/05/2023 1004   Iron Studies    Component Value Date/Time   IRON 36 (L) 12/11/2009 1848   TIBC 318 12/11/2009 1848   FERRITIN 19 12/11/2009 1848   IRONPCTSAT 11 (L) 12/11/2009 1848   Lipid Panel     Component Value Date/Time   CHOL 200 (H) 02/05/2023 1004   TRIG 68 02/05/2023 1004   HDL 58 02/05/2023 1004   CHOLHDL 3.8 03/13/2022 1548   VLDL 17 03/13/2022 1548   LDLCALC 130 (H) 02/05/2023 1004   LDLDIRECT 153 (H) 08/16/2020 1007   LDLDIRECT 108 (H) 12/01/2012 1506   Hepatic Function Panel     Component Value Date/Time   PROT 7.7 02/05/2023 1004   ALBUMIN 4.4 02/05/2023 1004   AST 17 02/05/2023 1004   ALT 9 02/05/2023 1004   ALKPHOS 151 (H) 02/05/2023 1004   BILITOT 0.3 02/05/2023 1004      Component Value Date/Time   TSH 1.410 02/05/2023 1004   Nutritional Lab Results  Component Value Date   VD25OH 60.0 02/05/2023   VD25OH 79.9 11/06/2021   VD25OH 65.2 04/07/2021    ASSOCIATED CONDITIONS ADDRESSED TODAY  ASSESSMENT AND PLAN  Problem List Items Addressed This Visit     Vitamin D  deficiency   Relevant Medications   Vitamin D, Ergocalciferol, (DRISDOL) 1.25 MG (50000 UNIT) CAPS capsule   Essential hypertension   Obesity (HCC)- Start BMI 50.0   BMI 40.0-44.9, adult (HCC) Current BMI 43.4   Hyperlipidemia, pure   Other Visit Diagnoses     Prediabetes    -  Primary      Prediabetes Labs were reviewed today and discussed with the patient.  Last A1c was 5.8- increasing/ Insulin 9.1 slightly increased.   Medication(s): None Polyphagia:No She is working on nutrition plan to decrease simple carbohydrates, increase lean proteins and exercise to promote weight loss, improve glycemic control and prevent progression to Type 2 diabetes.   Lab Results  Component Value Date   HGBA1C 5.8 (H) 02/05/2023   HGBA1C 5.5 03/13/2022   HGBA1C 5.6 11/06/2021   HGBA1C 5.5 04/29/2021   HGBA1C 7.3 (H) 04/07/2021   Lab Results  Component  Value Date   INSULIN 9.1 02/05/2023   INSULIN 7.3 11/06/2021   INSULIN 3.6 04/07/2021   INSULIN 13.8 11/06/2020   INSULIN 11.6 07/10/2020    Plan: Will monitor progress and may consider starting metformin for primary indication of prediabetes if continues to trend upward.   Continue working on nutrition plan to decrease simple carbohydrates, increase lean proteins and exercise to promote weight loss, improve glycemic control and prevent progression to Type 2 diabetes.   Vitamin D Deficiency Labs were reviewed today and discussed with the patient.  Vitamin D is at goal of 50.  Most recent vitamin D level was 60.0. She is on  prescription ergocalciferol 50,000 IU every 14 days. No N/V or muscle weakness or other side effects with vitamin D.  Lab Results  Component Value Date   VD25OH 60.0 02/05/2023   VD25OH 79.9 11/06/2021   VD25OH 65.2 04/07/2021    Plan:  Refill and continue Ergocalciferol 50,000 IU every 14 days.  Low vitamin D levels can be associated with adiposity and may result in leptin resistance and weight gain. Also  associated with fatigue. Currently on vitamin D supplementation without any adverse effects.    Hypertension Labs were reviewed today and discussed with the patient.  Hypertension stable, asymptomatic, reasonably well controlled, and no significant medication side effects noted.  Medication(s): HCTZ 25 mg daily Renal function is normal.   BP Readings from Last 3 Encounters:  03/03/23 124/85  02/04/23 (!) 126/90  02/03/23 124/88   Lab Results  Component Value Date   CREATININE 0.63 02/05/2023   CREATININE 0.83 07/14/2022   CREATININE 0.67 03/13/2022   No results found for: "GFR"  Plan: Continue all antihypertensives at current dosages. Continue to work on nutrition plan to promote weight loss and improve BP control.    Hyperlipidemia Labs were reviewed today and discussed with the patient.  LDL is not at goal. Medication(s): None Cardiovascular risk factors: dyslipidemia, hypertension, obesity (BMI >= 30 kg/m2), and sedentary lifestyle  Lab Results  Component Value Date   CHOL 200 (H) 02/05/2023   HDL 58 02/05/2023   LDLCALC 130 (H) 02/05/2023   LDLDIRECT 153 (H) 08/16/2020   TRIG 68 02/05/2023   CHOLHDL 3.8 03/13/2022   CHOLHDL 3.7 11/06/2021   CHOLHDL 3.0 04/07/2021   Lab Results  Component Value Date   ALT 9 02/05/2023   AST 17 02/05/2023   ALKPHOS 151 (H) 02/05/2023   BILITOT 0.3 02/05/2023   The 10-year ASCVD risk score (Arnett DK, et al., 2019) is: 11.7%   Values used to calculate the score:     Age: 61 years     Sex: Female     Is Non-Hispanic African American: Yes     Diabetic: No     Tobacco smoker: Yes     Systolic Blood Pressure: 124 mmHg     Is BP treated: Yes     HDL Cholesterol: 58 mg/dL     Total Cholesterol: 200 mg/dL  Plan: Trend of LDL downward since starting program and HDL and Triglycerides are at goals.  Continue to work on nutrition plan -decreasing simple carbohydrates, increasing lean proteins, decreasing saturated fats and  cholesterol , avoiding trans fats and exercise as able to promote weight loss, improve lipids and decrease cardiovascular risks. Plan to recheck lipids in ~ 4 months.      ATTESTASTION STATEMENTS:  Reviewed by clinician on day of visit: allergies, medications, problem list, medical history, surgical history, family history, social history, and  previous encounter notes.   I have personally spent 45 minutes total time today in preparation, patient care, nutritional counseling and documentation for this visit, including the following: review of clinical lab tests; review of medical tests/procedures/services.      April Kalama, PA-C

## 2023-03-18 ENCOUNTER — Observation Stay (HOSPITAL_COMMUNITY)
Admission: EM | Admit: 2023-03-18 | Discharge: 2023-03-19 | Disposition: A | Discharge status: Home / Self Care | Payer: Medicaid Other | Attending: Internal Medicine | Admitting: Internal Medicine

## 2023-03-18 ENCOUNTER — Other Ambulatory Visit: Payer: Self-pay

## 2023-03-18 ENCOUNTER — Emergency Department (HOSPITAL_COMMUNITY): Payer: Medicaid Other

## 2023-03-18 ENCOUNTER — Encounter (HOSPITAL_COMMUNITY): Payer: Self-pay

## 2023-03-18 DIAGNOSIS — R55 Syncope and collapse: Principal | ICD-10-CM | POA: Diagnosis present

## 2023-03-18 DIAGNOSIS — R42 Dizziness and giddiness: Secondary | ICD-10-CM | POA: Diagnosis not present

## 2023-03-18 DIAGNOSIS — Z87891 Personal history of nicotine dependence: Secondary | ICD-10-CM | POA: Diagnosis not present

## 2023-03-18 DIAGNOSIS — Z6841 Body Mass Index (BMI) 40.0 and over, adult: Secondary | ICD-10-CM | POA: Diagnosis not present

## 2023-03-18 DIAGNOSIS — E876 Hypokalemia: Secondary | ICD-10-CM | POA: Diagnosis present

## 2023-03-18 DIAGNOSIS — F22 Delusional disorders: Secondary | ICD-10-CM | POA: Diagnosis present

## 2023-03-18 DIAGNOSIS — Z96651 Presence of right artificial knee joint: Secondary | ICD-10-CM | POA: Diagnosis not present

## 2023-03-18 DIAGNOSIS — R231 Pallor: Secondary | ICD-10-CM | POA: Diagnosis not present

## 2023-03-18 DIAGNOSIS — E66813 Obesity, class 3: Secondary | ICD-10-CM

## 2023-03-18 DIAGNOSIS — I959 Hypotension, unspecified: Secondary | ICD-10-CM | POA: Diagnosis not present

## 2023-03-18 DIAGNOSIS — Z79899 Other long term (current) drug therapy: Secondary | ICD-10-CM | POA: Insufficient documentation

## 2023-03-18 DIAGNOSIS — I10 Essential (primary) hypertension: Secondary | ICD-10-CM | POA: Diagnosis not present

## 2023-03-18 DIAGNOSIS — R519 Headache, unspecified: Secondary | ICD-10-CM | POA: Diagnosis not present

## 2023-03-18 HISTORY — DX: Syncope and collapse: R55

## 2023-03-18 LAB — COMPREHENSIVE METABOLIC PANEL
ALT: 14 U/L (ref 0–44)
AST: 18 U/L (ref 15–41)
Albumin: 3.9 g/dL (ref 3.5–5.0)
Alkaline Phosphatase: 109 U/L (ref 38–126)
Anion gap: 11 (ref 5–15)
BUN: 24 mg/dL — ABNORMAL HIGH (ref 6–20)
CO2: 25 mmol/L (ref 22–32)
Calcium: 9.5 mg/dL (ref 8.9–10.3)
Chloride: 101 mmol/L (ref 98–111)
Creatinine, Ser: 0.79 mg/dL (ref 0.44–1.00)
GFR, Estimated: >60 mL/min (ref >60)
Glucose, Bld: 95 mg/dL (ref 70–99)
Potassium: 3.3 mmol/L — ABNORMAL LOW (ref 3.5–5.1)
Sodium: 137 mmol/L (ref 135–145)
Total Bilirubin: 0.3 mg/dL (ref 0.3–1.2)
Total Protein: 8.5 g/dL — ABNORMAL HIGH (ref 6.5–8.1)

## 2023-03-18 LAB — CBC WITH DIFFERENTIAL/PLATELET
Abs Immature Granulocytes: 0.02 10*3/uL (ref 0.00–0.07)
Basophils Absolute: 0 10*3/uL (ref 0.0–0.1)
Basophils Relative: 0 %
Eosinophils Absolute: 0.1 10*3/uL (ref 0.0–0.5)
Eosinophils Relative: 1 %
HCT: 42.8 % (ref 36.0–46.0)
Hemoglobin: 13.8 g/dL (ref 12.0–15.0)
Immature Granulocytes: 0 %
Lymphocytes Relative: 21 %
Lymphs Abs: 1.9 10*3/uL (ref 0.7–4.0)
MCH: 27.8 pg (ref 26.0–34.0)
MCHC: 32.2 g/dL (ref 30.0–36.0)
MCV: 86.1 fL (ref 80.0–100.0)
Monocytes Absolute: 0.7 10*3/uL (ref 0.1–1.0)
Monocytes Relative: 7 %
Neutro Abs: 6.4 10*3/uL (ref 1.7–7.7)
Neutrophils Relative %: 71 %
Platelets: 366 10*3/uL (ref 150–400)
RBC: 4.97 MIL/uL (ref 3.87–5.11)
RDW: 15.6 % — ABNORMAL HIGH (ref 11.5–15.5)
WBC: 9.1 10*3/uL (ref 4.0–10.5)
nRBC: 0 % (ref 0.0–0.2)

## 2023-03-18 LAB — URINALYSIS, COMPLETE (UACMP) WITH MICROSCOPIC
Bacteria, UA: NONE SEEN
Bilirubin Urine: NEGATIVE
Glucose, UA: NEGATIVE mg/dL
Hgb urine dipstick: NEGATIVE
Ketones, ur: 5 mg/dL — AB
Nitrite: NEGATIVE
Protein, ur: NEGATIVE mg/dL
Specific Gravity, Urine: 1.021 (ref 1.005–1.030)
pH: 5 (ref 5.0–8.0)

## 2023-03-18 LAB — RAPID URINE DRUG SCREEN, HOSP PERFORMED
Amphetamines: NOT DETECTED
Barbiturates: NOT DETECTED
Benzodiazepines: NOT DETECTED
Cocaine: NOT DETECTED
Opiates: NOT DETECTED
Tetrahydrocannabinol: POSITIVE — AB

## 2023-03-18 LAB — TSH: TSH: 0.934 u[IU]/mL (ref 0.350–4.500)

## 2023-03-18 LAB — TROPONIN I (HIGH SENSITIVITY)
Troponin I (High Sensitivity): 3 ng/L (ref <18)
Troponin I (High Sensitivity): 3 ng/L (ref <18)

## 2023-03-18 LAB — D-DIMER, QUANTITATIVE: D-Dimer, Quant: 0.42 ug/mL-FEU (ref 0.00–0.50)

## 2023-03-18 LAB — CK: Total CK: 76 U/L (ref 38–234)

## 2023-03-18 LAB — PHOSPHORUS: Phosphorus: 4 mg/dL (ref 2.5–4.6)

## 2023-03-18 LAB — MAGNESIUM: Magnesium: 2 mg/dL (ref 1.7–2.4)

## 2023-03-18 MED ORDER — TRAMADOL HCL 50 MG PO TABS
50.0000 mg | ORAL_TABLET | Freq: Four times a day (QID) | ORAL | Status: DC | PRN
  Administered 2023-03-19: 50 mg via ORAL
  Filled 2023-03-18: qty 1

## 2023-03-18 MED ORDER — BENZTROPINE MESYLATE 1 MG PO TABS
1.0000 mg | ORAL_TABLET | Freq: Every day | ORAL | Status: DC
  Administered 2023-03-19: 1 mg via ORAL
  Filled 2023-03-18: qty 1

## 2023-03-18 MED ORDER — POTASSIUM CHLORIDE 10 MEQ/100ML IV SOLN
10.0000 meq | INTRAVENOUS | Status: AC
  Administered 2023-03-18 – 2023-03-19 (×4): 10 meq via INTRAVENOUS
  Filled 2023-03-18 (×4): qty 100

## 2023-03-18 MED ORDER — ACETAMINOPHEN 500 MG PO TABS
1000.0000 mg | ORAL_TABLET | Freq: Once | ORAL | Status: AC
  Administered 2023-03-18: 1000 mg via ORAL
  Filled 2023-03-18: qty 2

## 2023-03-18 MED ORDER — SODIUM CHLORIDE 0.9 % IV SOLN: INTRAVENOUS | Status: AC

## 2023-03-18 MED ORDER — ONDANSETRON HCL 4 MG PO TABS: 4.0000 mg | ORAL_TABLET | Freq: Four times a day (QID) | ORAL | Status: DC | PRN

## 2023-03-18 MED ORDER — HALOPERIDOL 1 MG PO TABS
1.0000 mg | ORAL_TABLET | Freq: Two times a day (BID) | ORAL | Status: DC
  Administered 2023-03-19 (×2): 1 mg via ORAL
  Filled 2023-03-18 (×2): qty 1

## 2023-03-18 MED ORDER — ONDANSETRON HCL 4 MG/2ML IJ SOLN: 4.0000 mg | Freq: Four times a day (QID) | INTRAMUSCULAR | Status: DC | PRN

## 2023-03-18 MED ORDER — TOPIRAMATE 25 MG PO TABS
50.0000 mg | ORAL_TABLET | Freq: Every day | ORAL | Status: DC
  Administered 2023-03-19: 50 mg via ORAL
  Filled 2023-03-18: qty 2

## 2023-03-18 NOTE — Assessment & Plan Note (Signed)
given risk factor will admit rehydrate obtain CE, monitor on tele and obtain carotid dopplers obtain echo , will need cardiology follow up or consult if work up abnormal

## 2023-03-18 NOTE — Assessment & Plan Note (Signed)
Chronic follow up as an out pt

## 2023-03-18 NOTE — Assessment & Plan Note (Signed)
Will replace and recheck, check mag and phosphate

## 2023-03-18 NOTE — ED Triage Notes (Signed)
Patient BIB [facility] from home. Was standing up in the kitchen, began feeling sweaty, dizzy and then woke up on the floor. Complaining of left elbow pain.

## 2023-03-18 NOTE — Assessment & Plan Note (Signed)
Continue home medication including Haldol and Congentin
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
## 2023-03-18 NOTE — Subjective & Objective (Signed)
Patient comes in from home was standing in the kitchen when she felt sweaty or lightheaded and woke up on the floor reports left elbow pain Denies hitting her head feels that she probably woke up pretty quickly when she fell down no associated chest pain or back pain abdominal pain no recent fevers or chills no prior similar episodes

## 2023-03-18 NOTE — Assessment & Plan Note (Signed)
Allow permissive HTn for tonight 
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
## 2023-03-19 ENCOUNTER — Observation Stay (HOSPITAL_BASED_OUTPATIENT_CLINIC_OR_DEPARTMENT_OTHER): Payer: Medicaid Other

## 2023-03-19 ENCOUNTER — Observation Stay (HOSPITAL_COMMUNITY): Payer: Medicaid Other

## 2023-03-19 DIAGNOSIS — R55 Syncope and collapse: Secondary | ICD-10-CM | POA: Diagnosis not present

## 2023-03-19 DIAGNOSIS — Z96651 Presence of right artificial knee joint: Secondary | ICD-10-CM | POA: Diagnosis not present

## 2023-03-19 DIAGNOSIS — Z6841 Body Mass Index (BMI) 40.0 and over, adult: Secondary | ICD-10-CM | POA: Diagnosis not present

## 2023-03-19 DIAGNOSIS — E876 Hypokalemia: Secondary | ICD-10-CM | POA: Diagnosis not present

## 2023-03-19 DIAGNOSIS — Z79899 Other long term (current) drug therapy: Secondary | ICD-10-CM | POA: Diagnosis not present

## 2023-03-19 DIAGNOSIS — F22 Delusional disorders: Secondary | ICD-10-CM | POA: Diagnosis not present

## 2023-03-19 DIAGNOSIS — I10 Essential (primary) hypertension: Secondary | ICD-10-CM | POA: Diagnosis not present

## 2023-03-19 DIAGNOSIS — Z87891 Personal history of nicotine dependence: Secondary | ICD-10-CM | POA: Diagnosis not present

## 2023-03-19 LAB — CBC
HCT: 38.7 % (ref 36.0–46.0)
Hemoglobin: 12.6 g/dL (ref 12.0–15.0)
MCH: 28.1 pg (ref 26.0–34.0)
MCHC: 32.6 g/dL (ref 30.0–36.0)
MCV: 86.4 fL (ref 80.0–100.0)
Platelets: 306 10*3/uL (ref 150–400)
RBC: 4.48 MIL/uL (ref 3.87–5.11)
RDW: 15.6 % — ABNORMAL HIGH (ref 11.5–15.5)
WBC: 7 10*3/uL (ref 4.0–10.5)
nRBC: 0 % (ref 0.0–0.2)

## 2023-03-19 LAB — COMPREHENSIVE METABOLIC PANEL
ALT: 12 U/L (ref 0–44)
AST: 16 U/L (ref 15–41)
Albumin: 3.4 g/dL — ABNORMAL LOW (ref 3.5–5.0)
Alkaline Phosphatase: 96 U/L (ref 38–126)
Anion gap: 9 (ref 5–15)
BUN: 19 mg/dL (ref 6–20)
CO2: 25 mmol/L (ref 22–32)
Calcium: 8.6 mg/dL — ABNORMAL LOW (ref 8.9–10.3)
Chloride: 104 mmol/L (ref 98–111)
Creatinine, Ser: 0.7 mg/dL (ref 0.44–1.00)
GFR, Estimated: >60 mL/min (ref >60)
Glucose, Bld: 98 mg/dL (ref 70–99)
Potassium: 3.7 mmol/L (ref 3.5–5.1)
Sodium: 138 mmol/L (ref 135–145)
Total Bilirubin: 0.4 mg/dL (ref 0.3–1.2)
Total Protein: 7.2 g/dL (ref 6.5–8.1)

## 2023-03-19 LAB — ECHOCARDIOGRAM COMPLETE
Area-P 1/2: 3.65 cm2
Calc EF: 73.3 %
Height: 61 in
MV VTI: 1.89 cm2
S' Lateral: 2.6 cm
Single Plane A2C EF: 74.3 %
Single Plane A4C EF: 70.3 %
Weight: 3552 oz

## 2023-03-19 LAB — LACTIC ACID, PLASMA
Lactic Acid, Venous: 2 mmol/L (ref 0.5–1.9)
Lactic Acid, Venous: 1 mmol/L (ref 0.5–1.9)

## 2023-03-19 LAB — PREALBUMIN: Prealbumin: 21 mg/dL (ref 18–38)

## 2023-03-19 LAB — PHOSPHORUS: Phosphorus: 3.9 mg/dL (ref 2.5–4.6)

## 2023-03-19 LAB — HIV ANTIBODY (ROUTINE TESTING W REFLEX): HIV Screen 4th Generation wRfx: NONREACTIVE

## 2023-03-19 LAB — MAGNESIUM: Magnesium: 2 mg/dL (ref 1.7–2.4)

## 2023-03-19 MED ORDER — PNEUMOCOCCAL 20-VAL CONJ VACC 0.5 ML IM SUSY: 0.5000 mL | PREFILLED_SYRINGE | INTRAMUSCULAR | Status: DC

## 2023-03-19 MED ORDER — PNEUMOCOCCAL 20-VAL CONJ VACC 0.5 ML IM SUSY: 0.5000 mL | PREFILLED_SYRINGE | INTRAMUSCULAR | Status: DC | PRN

## 2023-03-19 NOTE — Discharge Instructions (Signed)
Please do not take HCTZ. Your BP is a little low. Make sure you drink and adequate amount of water this summer.

## 2023-03-19 NOTE — Progress Notes (Signed)
Bilateral carotid ultrasound study completed.   Please see CV Procedures for preliminary results.  Shaleen Talamantez, RVT  8:53 AM 03/19/23

## 2023-03-19 NOTE — TOC Initial Note (Signed)
Transition of Care [facility]) - Initial/Assessment Note    Patient Details  Name: April [name] MRN: [id number] Date of Birth: 04/25/1961  [facility]) CM/SW Contact:    Otelia Santee, LCSW Phone Number: 03/19/2023, 10:17 AM  Clinical Narrative:                 Met with pt to discuss SDOH concern for utility insecurity. Pt shares she often does not have the income to pay her bills. Pt also shares she struggles to afford food at times. Pt is agreeable to resources being placed on DC instructions and referrals being made through [facility].  Resource added to AVS. Referrals made through [facility].  TOC will continue to follow for discharge planning needs.   Expected Discharge Plan: Home/Self Care Barriers to Discharge: Continued Medical Work up   Patient Goals and CMS Choice Patient states their goals for this hospitalization and ongoing recovery are:: To return home CMS Medicare.gov Compare Post Acute Care list provided to:: Patient Choice offered to / list presented to : Patient [place] ownership interest in [facility].provided to:: Patient    Expected Discharge Plan and Services In-house Referral: NA Discharge Planning Services: NA Post Acute Care Choice: NA Living arrangements for the past 2 months: Apartment                 DME Arranged: N/A DME Agency: NA                  Prior Living Arrangements/Services Living arrangements for the past 2 months: Apartment Lives with:: Self Patient language and need for interpreter reviewed:: Yes Do you feel safe going back to the place where you live?: Yes      Need for Family Participation in Patient Care: No (Comment) Care giver support system in place?: No (comment)   Criminal Activity/Legal Involvement Pertinent to Current Situation/Hospitalization: No - Comment as needed  Activities of Daily Living Home Assistive Devices/Equipment: None ADL Screening (condition at time of  admission) Patient's cognitive ability adequate to safely complete daily activities?: Yes Is the patient deaf or have difficulty hearing?: No Does the patient have difficulty seeing, even when wearing glasses/contacts?: No Does the patient have difficulty concentrating, remembering, or making decisions?: No Patient able to express need for assistance with ADLs?: Yes Does the patient have difficulty dressing or bathing?: No Independently performs ADLs?: Yes (appropriate for developmental age) Does the patient have difficulty walking or climbing stairs?: No Weakness of Legs: Both Weakness of Arms/Hands: None  Permission Sought/Granted   Permission granted to share information with : No              Emotional Assessment Appearance:: Appears stated age Attitude/Demeanor/Rapport: Engaged Affect (typically observed): Accepting Orientation: : Oriented to Self, Oriented to Place, Oriented to  Time, Oriented to Situation Alcohol / Substance Use: Other (comment) (THC) Psych Involvement: No (comment)  Admission diagnosis:  Syncope and collapse [R55] Syncope [R55] Patient Active Problem List   Diagnosis Date Noted   Syncope 03/18/2023   Hypokalemia 03/18/2023   Hyperlipidemia, pure 03/03/2023   Obesity (HCC)- Start BMI 50.0 01/06/2023   BMI 40.0-44.9, adult [facility]) Current BMI 43.4 01/06/2023   Vaginal discharge 11/23/2022   Osteoarthritis of left knee 11/02/2022   Left knee pain 08/25/2022   Polyphagia 08/13/2022   Callus 07/23/2022   Benign nevus of plantar aspect of foot 06/29/2022   Delusional disorder (HCC) 03/14/2022   Pap smear for cervical cancer screening  09/16/2021   Tension headache 01/01/2021   Essential hypertension 08/15/2020   Insulin resistance 07/11/2019   Class 3 severe obesity with serious comorbidity and body mass index (BMI) of 50.0 to 59.9 in adult [facility]) 07/11/2019   Vitamin D deficiency 03/15/2019   Vitiligo 03/03/2018   Insomnia 11/05/2017   Sleep concern  03/02/2016   Osteoarthritis of knees, bilateral 09/23/2015   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   Low back pain 01/27/2012   GERD 03/22/2009   Anxiety state 11/14/2008   Eczema 05/18/2008   Depression 03/10/2007   HYPERCHOLESTEROLEMIA 12/09/2006   Anemia 12/09/2006   PCP:  [name], DO Pharmacy:   [facility] [phone] - [place], [place] - [address] AT [facility] [address] [place] [address] Phone: [phone] Fax: [phone]     Social Determinants of Health (SDOH) Social History: SDOH Screenings   Food Insecurity: No Food Insecurity (11/13/2022)  Housing: Patient Declined (09/14/2022)  Transportation Needs: No Transportation Needs (10/19/2022)  Utilities: At Risk (01/11/2023)  Alcohol Screen: Low Risk  (07/02/2022)  Depression (PHQ2-9): Low Risk  (02/04/2023)  Financial Resource Strain: Low Risk  (11/13/2022)  Physical Activity: Insufficiently Active (10/19/2022)  Social Connections: Moderately Isolated (01/11/2023)  Stress: Stress Concern Present (08/05/2022)  Tobacco Use: Medium Risk (03/18/2023)   SDOH Interventions: Utilities Interventions: [id number] Referral, Inpatient TOC   Readmission Risk Interventions     No data to display

## 2023-03-19 NOTE — Progress Notes (Signed)
  Echocardiogram 2D Echocardiogram has been performed.  April [name] 03/19/2023, 9:02 AM

## 2023-03-19 NOTE — Plan of Care (Signed)

## 2023-03-19 NOTE — Progress Notes (Signed)
SATURATION QUALIFICATIONS: (This note is used to comply with regulatory documentation for home oxygen) ° °Patient Saturations on Room Air at Rest = 99% ° °Patient Saturations on Room Air while Ambulating = 96% ° °

## 2023-03-19 NOTE — Plan of Care (Signed)

## 2023-03-19 NOTE — Discharge Summary (Signed)
Physician Discharge Summary  April [name] YNW:[id number] DOB: Jun 28, 1961 DOA: 03/18/2023  PCP: [name], DO  Admit date: 03/18/2023 Discharge date: 03/19/2023 Discharging to: home Recommendations for Outpatient Follow-up:  Please f/u BP and orthostatic vitals at next visit  Consults:  none Procedures:  none   Discharge Diagnoses:   Principal Problem:   Syncope Active Problems:   Class 3 severe obesity with serious comorbidity and body mass index (BMI) of 50.0 to 59.9 in adult [facility])   Essential hypertension   Delusional disorder (HCC)   Hypokalemia     Hospital Course:  This is a 62 year old female with hypertension and history of delusions who presents to the hospital after she became lightheaded and had a syncopal episode.  She states that she was in her kitchen when she felt the episode coming on.  She does recall that it was hot in the house, she does not feel that she has been drinking enough water and has been urinating quite a lot.  She takes HCTZ regularly. In the ED, she was noted to have a blood pressure of 107/73, mild tachypnea, lactic acid of 2.0, a potassium of 3.3 and a BUN creatinine ratio of greater than 20. UDS was noted to be positive for cannabis. CT of the head not show any abnormalities EKG revealed sinus bradycardia at 58 bpm  Principal Problem:   Syncope -Further workup included a 2D echo which does not reveal any abnormalities with her heart - Carotid ultrasound does not reveal any stenosis - Telemetry has not revealed any arrhythmias - Due to elevated BUN/creatinine ratio, elevated lactic acid, patient's complaint of not drinking enough fluids and urinating quite a lot, we both feel that she was likely dehydrated -She has received IV fluids and today is ambulating in the halls without any dizziness -Lactic acid has improved to 1.0 and BUN/creatinine ratio has also improved-she is drinking much more fluids today - Have recommended to ensure  adequate fluid intake at home   Active Problems:   Essential hypertension? - BP has been in low 100s - dc HCTZ    Class 3 severe obesity with serious comorbidity and body mass index (BMI) of 50.0 to 59.9 in adult [facility]) Body mass index is 41.95 kg/m.   Delusional disorder  -Continue all home meds as noted below    Hypokalemia -Likely secondary to HCTZ as well  - replaced         Discharge Instructions  Discharge Instructions     Increase activity slowly   Complete by: As directed       Allergies as of 03/19/2023       Reactions   Percocet [oxycodone-acetaminophen] Swelling   Naproxen Palpitations        Medication List     STOP taking these medications    hydrochlorothiazide 25 MG tablet Commonly known as: HYDRODIURIL       TAKE these medications    benztropine 1 MG tablet Commonly known as: COGENTIN Take 1 tablet (1 mg total) by mouth daily.   cetirizine 10 MG tablet Commonly known as: ZYRTEC Take 1 tablet (10 mg total) by mouth daily. What changed: when to take this   diclofenac Sodium 1 % Gel Commonly known as: VOLTAREN APPLY 2 GRAMS TOPICALLY TO THE AFFECTED AREA FOUR TIMES DAILY What changed: See the new instructions.   haloperidol 1 MG tablet Commonly known as: HALDOL Take 1 tablet (1 mg total) by mouth 2 (two) times daily. What changed:  when to  take this reasons to take this   topiramate 25 MG tablet Commonly known as: Topamax Take 2 tablets (50 mg total) by mouth at bedtime.   traMADol 50 MG tablet Commonly known as: ULTRAM Take 1 tablet (50 mg total) by mouth daily. What changed:  when to take this reasons to take this   Vitamin D (Ergocalciferol) 1.25 MG (50000 UNIT) Caps capsule Commonly known as: DRISDOL One Capsule by mouth every 14 days            The results of significant diagnostics from this hospitalization (including imaging, microbiology, ancillary and laboratory) are listed below for reference.    VAS  US CAROTID  Result Date: 03/19/2023 Carotid Arterial Duplex Study Patient Name:  April [name]  Date of Exam:   03/19/2023 Medical Rec #: [id number]           Accession #:    [id number] Date of Birth: 01/20/1961           Patient Gender: F Patient Age:   62 years Exam Location:  [facility] Procedure:      VAS US CAROTID Referring Phys: Jonny Ruiz DOUTOVA --------------------------------------------------------------------------------  Indications:       Syncope. Risk Factors:      Hypertension, hyperlipidemia, current smoker. Comparison Study:  No previous study. Performing Technologist: McKayla Maag RVT, [place]  Examination Guidelines: A complete evaluation includes B-mode imaging, spectral Doppler, color Doppler, and power Doppler as needed of all accessible portions of each vessel. Bilateral testing is considered an integral part of a complete examination. Limited examinations for reoccurring indications may be performed as noted.  Right Carotid Findings: +----------+--------+--------+--------+-------------------------+--------+           PSV cm/sEDV cm/sStenosisPlaque Description       Comments +----------+--------+--------+--------+-------------------------+--------+ CCA Prox  81      17                                                +----------+--------+--------+--------+-------------------------+--------+ CCA Distal71      19                                                +----------+--------+--------+--------+-------------------------+--------+ ICA Prox  101     23      1-39%   homogeneous and irregular         +----------+--------+--------+--------+-------------------------+--------+ ICA Distal89      31                                                +----------+--------+--------+--------+-------------------------+--------+ ECA       99      14                                                 +----------+--------+--------+--------+-------------------------+--------+ +----------+--------+-------+----------------+-------------------+           PSV cm/sEDV cmsDescribe        Arm Pressure (mmHG) +----------+--------+-------+----------------+-------------------+ [id number]  Multiphasic, WNL                    +----------+--------+-------+----------------+-------------------+ +---------+--------+--+--------+-+---------+ VertebralPSV cm/s29EDV cm/s8Antegrade +---------+--------+--+--------+-+---------+  Left Carotid Findings: +----------+--------+--------+--------+--------------------------+--------+           PSV cm/sEDV cm/sStenosisPlaque Description        Comments +----------+--------+--------+--------+--------------------------+--------+ CCA Prox  111     23                                                 +----------+--------+--------+--------+--------------------------+--------+ CCA Distal80      21                                                 +----------+--------+--------+--------+--------------------------+--------+ ICA Prox  55      15      1-39%   heterogenous and irregular         +----------+--------+--------+--------+--------------------------+--------+ ICA Distal93      34                                                 +----------+--------+--------+--------+--------------------------+--------+ ECA       44                                                         +----------+--------+--------+--------+--------------------------+--------+ +----------+--------+--------+----------------+-------------------+           PSV cm/sEDV cm/sDescribe        Arm Pressure (mmHG) +----------+--------+--------+----------------+-------------------+ [id number]             Multiphasic, WNL                    +----------+--------+--------+----------------+-------------------+ +---------+--------+--+--------+--+---------+  VertebralPSV cm/s42EDV cm/s17Antegrade +---------+--------+--+--------+--+---------+   Summary: Right Carotid: Velocities in the right ICA are consistent with a 1-39% stenosis. Left Carotid: Velocities in the left ICA are consistent with a 1-39% stenosis. Vertebrals:  Bilateral vertebral arteries demonstrate antegrade flow. Subclavians: Normal flow hemodynamics were seen in bilateral subclavian              arteries. *See table(s) above for measurements and observations.     Preliminary    ECHOCARDIOGRAM COMPLETE  Result Date: 03/19/2023    ECHOCARDIOGRAM REPORT   Patient Name:   April [name] Date of Exam: 03/19/2023 Medical Rec #:  [id number]          Height:       61.0 in Accession #:    [id number]         Weight:       222.0 lb Date of Birth:  [birth year]          BSA:          1.974 m Patient Age:    61 years           BP:           117/76 mmHg Patient Gender: F  HR:           60 bpm. Exam Location:  Inpatient Procedure: 2D Echo, 3D Echo, Cardiac Doppler and Color Doppler Indications:    Syncope  History:        Patient has no prior history of Echocardiogram examinations.                 Risk Factors:Former Smoker, Obesity and Hypertension. Hx of                 substance abuse.  Sonographer:    Milda Smart Referring Phys: [id number] ANASTASSIA DOUTOVA  Sonographer Comments: Image acquisition challenging due to respiratory motion. IMPRESSIONS  1. Left ventricular ejection fraction, by estimation, is 60 to 65%. The left ventricle has normal function. The left ventricle has no regional wall motion abnormalities. Left ventricular diastolic parameters were normal.  2. Right ventricular systolic function is normal. The right ventricular size is normal.  3. The mitral valve is normal in structure. Trivial mitral valve regurgitation. No evidence of mitral stenosis.  4. The aortic valve is normal in structure. Aortic valve regurgitation is not visualized. No aortic stenosis is present.  5. The  inferior vena cava is normal in size with greater than 50% respiratory variability, suggesting right atrial pressure of 3 mmHg.  6. There is a cystic structure noted in the liver on subcostal imaging. Consider dedicated liver imaging. FINDINGS  Left Ventricle: Left ventricular ejection fraction, by estimation, is 60 to 65%. The left ventricle has normal function. The left ventricle has no regional wall motion abnormalities. The left ventricular internal cavity size was normal in size. There is  no left ventricular hypertrophy. Left ventricular diastolic parameters were normal. Right Ventricle: The right ventricular size is normal. No increase in right ventricular wall thickness. Right ventricular systolic function is normal. Left Atrium: Left atrial size was normal in size. Right Atrium: Right atrial size was normal in size. Pericardium: There is no evidence of pericardial effusion. Mitral Valve: The mitral valve is normal in structure. Trivial mitral valve regurgitation. No evidence of mitral valve stenosis. MV peak gradient, 5.4 mmHg. The mean mitral valve gradient is 2.0 mmHg. Tricuspid Valve: The tricuspid valve is normal in structure. Tricuspid valve regurgitation is mild . No evidence of tricuspid stenosis. Aortic Valve: The aortic valve is normal in structure. Aortic valve regurgitation is not visualized. No aortic stenosis is present. Pulmonic Valve: The pulmonic valve was normal in structure. Pulmonic valve regurgitation is trivial. No evidence of pulmonic stenosis. Aorta: The aortic root is normal in size and structure. Venous: The inferior vena cava is normal in size with greater than 50% respiratory variability, suggesting right atrial pressure of 3 mmHg. IAS/Shunts: No atrial level shunt detected by color flow Doppler.  LEFT VENTRICLE PLAX 2D LVIDd:         4.70 cm     Diastology LVIDs:         2.60 cm     LV e' medial:    8.27 cm/s LV PW:         0.90 cm     LV E/e' medial:  12.5 LV IVS:        0.60 cm      LV e' lateral:   10.60 cm/s LVOT diam:     1.90 cm     LV E/e' lateral: 9.7 LV SV:         79 LV SV Index:   40 LVOT Area:  2.84 cm                             3D Volume EF: LV Volumes (MOD)           3D EF:        67 % LV vol d, MOD A2C: 74.2 ml LV EDV:       115 ml LV vol d, MOD A4C: 84.9 ml LV ESV:       38 ml LV vol s, MOD A2C: 19.1 ml LV SV:        77 ml LV vol s, MOD A4C: 25.2 ml LV SV MOD A2C:     55.1 ml LV SV MOD A4C:     84.9 ml LV SV MOD BP:      60.6 ml RIGHT VENTRICLE RV Basal diam:  3.20 cm RV S prime:     13.30 cm/s TAPSE (M-mode): 2.9 cm LEFT ATRIUM             Index        RIGHT ATRIUM           Index LA diam:        3.40 cm 1.72 cm/m   RA Area:     13.80 cm LA Vol (A2C):   49.7 ml 25.17 ml/m  RA Volume:   26.50 ml  13.42 ml/m LA Vol (A4C):   33.5 ml 16.97 ml/m LA Biplane Vol: 44.0 ml 22.29 ml/m  AORTIC VALVE LVOT Vmax:   127.00 cm/s LVOT Vmean:  92.500 cm/s LVOT VTI:    0.279 m  AORTA Ao Root diam: 2.70 cm Ao Asc diam:  3.00 cm MITRAL VALVE                TRICUSPID VALVE MV Area (PHT): 3.65 cm     TR Peak grad:   23.8 mmHg MV Area VTI:   1.89 cm     TR Mean grad:   17.0 mmHg MV Peak grad:  5.4 mmHg     TR Vmax:        244.00 cm/s MV Mean grad:  2.0 mmHg     TR Vmean:       198.0 cm/s MV Vmax:       1.16 m/s MV Vmean:      70.7 cm/s    SHUNTS MV Decel Time: 208 msec     Systemic VTI:  0.28 m MV E velocity: 103.00 cm/s  Systemic Diam: 1.90 cm MV A velocity: 109.00 cm/s MV E/A ratio:  0.94 Arvilla Meres MD Electronically signed by Arvilla Meres MD Signature Date/Time: 03/19/2023/9:35:56 AM    Final    CT Head Wo Contrast  Result Date: 03/18/2023 CLINICAL DATA:  New onset headaches, initial encounter EXAM: CT HEAD WITHOUT CONTRAST TECHNIQUE: Contiguous axial images were obtained from the base of the skull through the vertex without intravenous contrast. RADIATION DOSE REDUCTION: This exam was performed according to the departmental dose-optimization program which includes  automated exposure control, adjustment of the mA and/or kV according to patient size and/or use of iterative reconstruction technique. COMPARISON:  None Available. FINDINGS: Brain: No evidence of acute infarction, hemorrhage, hydrocephalus, extra-axial collection or mass lesion/mass effect. Vascular: No hyperdense vessel or unexpected calcification. Skull: Normal. Negative for fracture or focal lesion. Sinuses/Orbits: No acute finding. Other: None. IMPRESSION: No acute intracranial abnormality noted. Electronically Signed   By: Alcide Clever M.D.   On: 03/18/2023 19:36  DG Chest Port 1 View  Result Date: 03/18/2023 CLINICAL DATA:  Syncopal episode. EXAM: PORTABLE CHEST 1 VIEW COMPARISON:  November 21, 2021 FINDINGS: The heart size and mediastinal contours are within normal limits. Both lungs are clear. The visualized skeletal structures are unremarkable. IMPRESSION: No active disease. Electronically Signed   By: Aram Candela M.D.   On: 03/18/2023 18:41   Labs:   Basic Metabolic Panel: Recent Labs  Lab 03/18/23 1900 03/19/23 0346  NA 137 138  K 3.3* 3.7  CL 101 104  CO2 25 25  GLUCOSE 95 98  BUN 24* 19  CREATININE 0.79 0.70  CALCIUM 9.5 8.6*  MG 2.0 2.0  PHOS 4.0 3.9     CBC: Recent Labs  Lab 03/18/23 1900 03/19/23 0346  WBC 9.1 7.0  NEUTROABS 6.4  --   HGB 13.8 12.6  HCT 42.8 38.7  MCV 86.1 86.4  PLT 366 306         SIGNED:   Calvert Cantor, MD  [facility] 03/19/2023, 2:45 PM

## 2023-03-19 NOTE — Progress Notes (Signed)
Mobility Specialist - Progress Note   03/19/23 0928  Mobility  Activity Ambulated with assistance in hallway  Level of Assistance Standby assist, set-up cues, supervision of patient - no hands on  Assistive Device None  Distance Ambulated (ft) 180 ft  Range of Motion/Exercises Active  Activity Response Tolerated well  Mobility Referral Yes  $Mobility charge 1 Mobility  Mobility Specialist Start Time (ACUTE ONLY) [phone]  Mobility Specialist Stop Time (ACUTE ONLY) 0926  Mobility Specialist Time Calculation (min) (ACUTE ONLY) 9 min   Pt received in bed and agreed to mobility, with no issues throughout session pt returned to bed with all needs met.   Marilynne Halsted Mobility Specialist

## 2023-03-24 ENCOUNTER — Ambulatory Visit: Payer: Medicaid Other | Admitting: Neurology

## 2023-03-24 ENCOUNTER — Encounter: Payer: Self-pay | Admitting: Neurology

## 2023-03-24 VITALS — BP 108/65 | HR 58 | Ht 60.0 in | Wt 226.0 lb

## 2023-03-24 DIAGNOSIS — G4700 Insomnia, unspecified: Secondary | ICD-10-CM | POA: Diagnosis not present

## 2023-03-24 DIAGNOSIS — Z9189 Other specified personal risk factors, not elsewhere classified: Secondary | ICD-10-CM | POA: Diagnosis not present

## 2023-03-24 DIAGNOSIS — G4719 Other hypersomnia: Secondary | ICD-10-CM | POA: Diagnosis not present

## 2023-03-24 DIAGNOSIS — R351 Nocturia: Secondary | ICD-10-CM | POA: Diagnosis not present

## 2023-03-24 DIAGNOSIS — R0683 Snoring: Secondary | ICD-10-CM

## 2023-03-24 NOTE — Patient Instructions (Signed)
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
## 2023-04-01 ENCOUNTER — Other Ambulatory Visit (INDEPENDENT_AMBULATORY_CARE_PROVIDER_SITE_OTHER): Payer: Self-pay | Admitting: Physician Assistant

## 2023-04-01 ENCOUNTER — Telehealth (HOSPITAL_COMMUNITY): Payer: Self-pay | Admitting: *Deleted

## 2023-04-01 ENCOUNTER — Ambulatory Visit (INDEPENDENT_AMBULATORY_CARE_PROVIDER_SITE_OTHER): Payer: Medicaid Other | Admitting: Physician Assistant

## 2023-04-01 ENCOUNTER — Other Ambulatory Visit (HOSPITAL_COMMUNITY): Payer: Self-pay | Admitting: Student in an Organized Health Care Education/Training Program

## 2023-04-01 ENCOUNTER — Encounter (INDEPENDENT_AMBULATORY_CARE_PROVIDER_SITE_OTHER): Payer: Self-pay | Admitting: Physician Assistant

## 2023-04-01 VITALS — BP 122/78 | HR 60 | Temp 98.2°F | Ht 60.0 in | Wt 224.0 lb

## 2023-04-01 DIAGNOSIS — R632 Polyphagia: Secondary | ICD-10-CM

## 2023-04-01 DIAGNOSIS — I10 Essential (primary) hypertension: Secondary | ICD-10-CM

## 2023-04-01 DIAGNOSIS — R7303 Prediabetes: Secondary | ICD-10-CM

## 2023-04-01 DIAGNOSIS — F22 Delusional disorders: Secondary | ICD-10-CM | POA: Diagnosis not present

## 2023-04-01 DIAGNOSIS — E669 Obesity, unspecified: Secondary | ICD-10-CM

## 2023-04-01 DIAGNOSIS — E559 Vitamin D deficiency, unspecified: Secondary | ICD-10-CM

## 2023-04-01 DIAGNOSIS — Z6841 Body Mass Index (BMI) 40.0 and over, adult: Secondary | ICD-10-CM | POA: Diagnosis not present

## 2023-04-01 MED ORDER — TOPIRAMATE 25 MG PO TABS: 50.0000 mg | ORAL_TABLET | Freq: Every day | ORAL | 1 refills | Status: DC

## 2023-04-01 MED ORDER — BENZTROPINE MESYLATE 1 MG PO TABS
1.00 mg | ORAL_TABLET | Freq: Every day | ORAL | 2 refills | Status: DC
Start: 2023-04-01 — End: 2023-04-16

## 2023-04-01 MED ORDER — VITAMIN D (ERGOCALCIFEROL) 1.25 MG (50000 UNIT) PO CAPS: ORAL_CAPSULE | ORAL | 0 refills | Status: DC

## 2023-04-01 NOTE — Progress Notes (Signed)
.smr  Office: [phone]  /  Fax: [phone]  WEIGHT SUMMARY AND BIOMETRICS  Vitals Temp: 98.2 F (36.8 C) BP: 122/78 Pulse Rate: 60 SpO2: 97 %   Anthropometric Measurements Height: 5' (1.524 m) Weight: 224 lb (101.6 kg) BMI (Calculated): 43.75 Weight at Last Visit: 218 lb Weight Lost Since Last Visit: 0 lb Weight Gained Since Last Visit: 6 lb Starting Weight: 256 lb   Body Composition  Body Fat %: 50.5 % Fat Mass (lbs): 113.6 lbs Muscle Mass (lbs): 105.6 lbs Total Body Water (lbs): 77.8 lbs Visceral Fat Rating : 17   Other Clinical Data Fasting: no Labs: no Today's Visit #: 20 Starting Date: 06/13/19     HPI  Chief Complaint: OBESITY  April [name] is here to discuss her progress with her obesity treatment plan. She is on the the Category 2 Plan and states she is following her eating plan approximately 80 % of the time. She states she is exercising walking/[employer] 40-45 minutes 2 times per week.   Interval History:  Since last office visit she up 6 lbs. Hospitalized 6/6-03/17/23 for syncopal episode thought to be brought on by dehydration. Patient also comes in today reporting she has started drinking energy drinks and may have been drinking more than 1 drink daily (caffeine content of the drinks 150-200 mg per drink) We discussed that the caffeine content is excessive and she should limit to a max of 1 drink daily if at all. She is going to see her PCP for post hospital follow up next week and she will discuss further with PCP as well as whether she should resume hydrochlorothiazide.   Hunger/appetite-not excessive. Denies skipping meals, but does report some financial difficulties affording food. Provided the patient with FoodFinder APP and downloaded to the patient's phone today to use as resource for food banks, etc.   Cravings- Has been indulging in ice cream and we discussed some healthier options  Stress- reports manageable overall Sleep- Reports waking up  frequently - Has sleep study pending and will follow up with PCP on making sure this is completed.  [facility] and discussed increasing to 3 times per week.  Hydration-Discussed drinking at least 64 oz of water daily and discussed how caffeine may decrease overall hydration and best to focus on drinking more water.    Pharmacotherapy: topamax 25 mg at bedtime for emotional eating behavior/cravings. No side effects on lower dose and feels is helping curb cravings.   TREATMENT PLAN FOR OBESITY:  Recommended Dietary Goals  April [name] is currently in the action stage of change. As such, her goal is to continue weight management plan. She has agreed to the Category 2 Plan.  Behavioral Intervention  We discussed the following Behavioral Modification Strategies today: increasing lean protein intake, decreasing simple carbohydrates , increasing vegetables, increasing lower glycemic fruits, increasing fiber rich foods, avoiding skipping meals, increasing water intake, continue to practice mindfulness when eating, and planning for success.  Additional resources provided today: FoodFinder App  Recommended Physical Activity Goals  April [name] has been advised to work up to 150 minutes of moderate intensity aerobic activity a week and strengthening exercises 2-3 times per week for cardiovascular health, weight loss maintenance and preservation of muscle mass.   She has agreed to Continue current level of physical activity  and Think about ways to increase daily physical activity and overcoming barriers to exercise   Pharmacotherapy We discussed various medication options to help April [name] with her weight loss efforts and we both  agreed to continue topamax 25 mg at bedtime to help with emotional eating behaviors.    Return in about 4 weeks (around 04/29/2023) for Fasting Lab.[name] Kitchen She was informed of the importance of frequent follow up visits to maximize her success with intensive lifestyle  modifications for her multiple health conditions.  PHYSICAL EXAM:  Blood pressure 122/78, pulse 60, temperature 98.2 F (36.8 C), height 5' (1.524 m), weight 224 lb (101.6 kg), SpO2 97 %. Body mass index is 43.75 kg/m.  General: She is overweight, cooperative, alert, well developed, and in no acute distress. PSYCH: Has normal mood, affect and thought process.   Cardiovascular: HR 60's regular, BP 122/78 Lungs: Normal breathing effort, no conversational dyspnea.  DIAGNOSTIC DATA REVIEWED:  BMET    Component Value Date/Time   NA 138 03/19/2023 0346   NA 136 02/05/2023 1004   K 3.7 03/19/2023 0346   CL 104 03/19/2023 0346   CO2 25 03/19/2023 0346   GLUCOSE 98 03/19/2023 0346   BUN 19 03/19/2023 0346   BUN 19 02/05/2023 1004   CREATININE 0.70 03/19/2023 0346   CREATININE 0.87 03/02/2016 1137   CALCIUM 8.6 (L) 03/19/2023 0346   GFRNONAA >60 03/19/2023 0346   GFRNONAA 76 03/02/2016 1137   GFRAA 111 11/06/2020 0907   GFRAA 88 03/02/2016 1137   Lab Results  Component Value Date   HGBA1C 5.8 (H) 02/05/2023   HGBA1C 5.5 12/01/2012   Lab Results  Component Value Date   INSULIN 9.1 02/05/2023   INSULIN 9.5 06/13/2019   Lab Results  Component Value Date   TSH 0.934 03/18/2023   CBC    Component Value Date/Time   WBC 7.0 03/19/2023 0346   RBC 4.48 03/19/2023 0346   HGB 12.6 03/19/2023 0346   HGB 13.7 02/05/2023 1004   HCT 38.7 03/19/2023 0346   HCT 41.8 02/05/2023 1004   PLT 306 03/19/2023 0346   PLT 341 02/05/2023 1004   MCV 86.4 03/19/2023 0346   MCV 86 02/05/2023 1004   MCH 28.1 03/19/2023 0346   MCHC 32.6 03/19/2023 0346   RDW 15.6 (H) 03/19/2023 0346   RDW 14.1 02/05/2023 1004   Iron Studies    Component Value Date/Time   IRON 36 (L) 12/11/2009 1848   TIBC 318 12/11/2009 1848   FERRITIN 19 12/11/2009 1848   IRONPCTSAT 11 (L) 12/11/2009 1848   Lipid Panel     Component Value Date/Time   CHOL 200 (H) 02/05/2023 1004   TRIG 68 02/05/2023 1004   HDL  58 02/05/2023 1004   CHOLHDL 3.8 03/13/2022 1548   VLDL 17 03/13/2022 1548   LDLCALC 130 (H) 02/05/2023 1004   LDLDIRECT 153 (H) 08/16/2020 1007   LDLDIRECT 108 (H) 12/01/2012 1506   Hepatic Function Panel     Component Value Date/Time   PROT 7.2 03/19/2023 0346   PROT 7.7 02/05/2023 1004   ALBUMIN 3.4 (L) 03/19/2023 0346   ALBUMIN 4.4 02/05/2023 1004   AST 16 03/19/2023 0346   ALT 12 03/19/2023 0346   ALKPHOS 96 03/19/2023 0346   BILITOT 0.4 03/19/2023 0346   BILITOT 0.3 02/05/2023 1004      Component Value Date/Time   TSH 0.934 03/18/2023 1900   TSH 1.410 02/05/2023 1004   Nutritional Lab Results  Component Value Date   VD25OH 60.0 02/05/2023   VD25OH 79.9 11/06/2021   VD25OH 65.2 04/07/2021    ASSOCIATED CONDITIONS ADDRESSED TODAY  ASSESSMENT AND PLAN  Problem List Items Addressed This Visit  Vitamin D deficiency   Relevant Medications   Vitamin D, Ergocalciferol, (DRISDOL) 1.25 MG (50000 UNIT) CAPS capsule   Essential hypertension   Polyphagia   Relevant Medications   topiramate (TOPAMAX) 25 MG tablet   Obesity (HCC)- Start BMI 50.0   BMI 40.0-44.9, adult (HCC) Current BMI 43.4   Other Visit Diagnoses     Prediabetes    -  Primary      Prediabetes Last A1c was 5.8- increasing/ Insulin 9.1 increasing, not at goals.   Medication(s): None Polyphagia:Yes She is on topamax for cravings/emotional eating behaviors.  She is working on Engineer, technical sales to decrease simple carbohydrates, increase lean proteins and exercise to promote weight loss, improve glycemic control and prevent progression to Type 2 diabetes.   Lab Results  Component Value Date   HGBA1C 5.8 (H) 02/05/2023   HGBA1C 5.5 03/13/2022   HGBA1C 5.6 11/06/2021   HGBA1C 5.5 04/29/2021   HGBA1C 7.3 (H) 04/07/2021   Lab Results  Component Value Date   INSULIN 9.1 02/05/2023   INSULIN 7.3 11/06/2021   INSULIN 3.6 04/07/2021   INSULIN 13.8 11/06/2020   INSULIN 11.6 07/10/2020    Plan:   Worsening of A1C/ Insulin levels. She is getting back on track with her nutrition plan after recent hospitalization. Some food insecurity and downloaded FoodFinder App to patient's phone for locations of churches with food banks and other food bank resources for the patient.  Continue working on nutrition plan to decrease simple carbohydrates, increase lean proteins and exercise to promote weight loss, improve glycemic control and prevent progression to Type 2 diabetes.  Will plan to recheck labs in 2-3 months. Consider metformin .  Polyphagia Currently this is poorly controlled. Medication(s): Topiramate 25 mg at bedtime .  Reported side effects: None  Plan: Continue and refill Topiramate 25 mg at bedtime She will continue to work on nutritional and behavioral strategies to promote weight loss.    Hypertension Hypertension well controlled and asymptomatic.  Medication(s): Off of hydrochlorothiazide for now after felt to have had dehydration resulting in hypotension/syncopal episode at home alone. Work up otherwise negative.  She has remained off hydrochlorothiazide and BP is normal today. She reports hydrating well , but also use of energy drinks with significant amounts of caffeine.  Renal function appears normal.   BP Readings from Last 3 Encounters:  04/01/23 122/78  03/24/23 108/65  03/19/23 104/69   Lab Results  Component Value Date   CREATININE 0.70 03/19/2023   CREATININE 0.79 03/18/2023   CREATININE 0.63 02/05/2023   No results found for: "GFR"  Plan: Monitor off medications for now. Encouraged to hydrate well daily and drink mostly water or water based drinks rather than energy drinks at this point. She has follow up with her PCP next week and will discuss medication further with PCP.   Vitamin D Deficiency Vitamin D is at goal of 50.  Most recent vitamin D level was 60. She is on  prescription ergocalciferol 50,000 IU every 14 days. No N/V or muscle weakness or other  side effects with Ergocalciferol. Lab Results  Component Value Date   VD25OH 60.0 02/05/2023   VD25OH 79.9 11/06/2021   VD25OH 65.2 04/07/2021    Plan: Continue and refill  prescription ergocalciferol 50,000 IU every 14 days Low vitamin D levels can be associated with adiposity and may result in leptin resistance and weight gain. Also associated with fatigue. Currently on vitamin D supplementation without any adverse effects.  Recheck vitamin D level  in 2-3 months.    ATTESTASTION STATEMENTS:  Reviewed by clinician on day of visit: allergies, medications, problem list, medical history, surgical history, family history, social history, and previous encounter notes.   I have personally spent 30 minutes total time today in preparation, patient care, nutritional counseling and documentation for this visit, including the following: review of clinical lab tests; review of medical tests/procedures/services.      Glen Blatchley, PA-C

## 2023-04-01 NOTE — Telephone Encounter (Signed)
benztropine (COGENTIN) 1 MG tablet [[id number]]   Order Details Dose: 1 mg Route: Oral Frequency: Daily  Dispense Quantity: 60 tablet Refills: 2        Sig: Take 1 tablet (1 mg total) by mouth daily.       Start Date: 05/11/22 End Date: 05/11/23 after 365 doses  Written Date: 05/11/22 Expiration Date: 05/11/23     Associated Diagnoses: Delusional disorder [facility]) [F22]  Providers  Authorizing Provider: [name], MD   [facility] [phone] - , [place] - [address]   Next Appt--04/16/23

## 2023-04-02 ENCOUNTER — Telehealth: Payer: Self-pay | Admitting: Neurology

## 2023-04-02 NOTE — Telephone Encounter (Signed)
NPSG- [facility] Healthy blue pending uploaded notes.

## 2023-04-08 ENCOUNTER — Ambulatory Visit: Payer: Medicaid Other | Admitting: Family Medicine

## 2023-04-08 ENCOUNTER — Encounter: Payer: Self-pay | Admitting: Family Medicine

## 2023-04-08 VITALS — BP 109/74 | HR 63 | Ht 60.0 in | Wt 225.5 lb

## 2023-04-08 DIAGNOSIS — I10 Essential (primary) hypertension: Secondary | ICD-10-CM | POA: Diagnosis not present

## 2023-04-08 NOTE — Patient Instructions (Addendum)
It was great seeing you today!  Today we discussed your blood pressure, it seems to be doing well and I do not want it to be too low causing you to get dizzy and fall so please do not take your hydrochlorothiazide anymore.   Make sure to stay hydrated, drink at least 6-8 glasses of water a day.  If you experience chest pain, shortness of breath, heart racing, headaches, weakness or dizziness then please go to the emergency department.   Please follow up at your next scheduled appointment on 7/11 at 8:30 am with Dr. Claudean Severance, if anything arises between now and then, please don't hesitate to contact our office.   Thank you for allowing [place] to be a part of your medical care!  Thank you, Dr. Robyne Peers

## 2023-04-08 NOTE — Assessment & Plan Note (Addendum)
-  BP at goal, concerned for hypotension given recent fall. Orthostatics positive. Discontinued hydrochlorothiazide. Up to date on BMP, renal function and electrolytes appropriate.  -diet and exercise counseling, encouraged maintenance of active lifestyle  -strict return and ED precautions discussed  -follow up scheduled in 2 weeks for BP check to ensure she can be off of antihypertensive, may consider ambulatory BP monitoring

## 2023-04-08 NOTE — Progress Notes (Signed)
    SUBJECTIVE:   CHIEF COMPLAINT / HPI:   Patient presents with history of hypertension. Was taking hydrochlorothiazide daily and then has been taking it a few times a week. Initially was getting dizzy and now feels a lot better. Last took her BP med 3 days ago. Denies chest pain, dyspnea, leg swelling, palpitations, dizziness or weakness. Continues to participate in the healthy weight and wellness program, she remains very active. Earlier this month, she had a fall in the kitchen when she got dizzy and called the ambulance. Denies head trauma or loss of consciousness.   OBJECTIVE:   BP 109/74   Pulse 63   Ht 5' (1.524 m)   Wt 225 lb 8 oz (102.3 kg)   SpO2 100%   BMI 44.04 kg/m   General: Patient well-appearing, in no acute distress. CV: RRR, no murmurs or gallops auscultated Resp: CTAB, no wheezing, rales or rhonchi noted Neuro: CN 2-12 grossly intact, 5/5 UE and LE strength bilaterally, gross sensation intact, normal gait  Psych: mood appropriate   ASSESSMENT/PLAN:   Essential hypertension -BP at goal, concerned for hypotension given recent fall. Orthostatics positive. Discontinued hydrochlorothiazide. Up to date on BMP, renal function and electrolytes appropriate.  -diet and exercise counseling, encouraged maintenance of active lifestyle  -strict return and ED precautions discussed  -follow up scheduled in 2 weeks for BP check to ensure she can be off of antihypertensive, may consider ambulatory BP monitoring   Health maintenance -Lipid panel and A1c up to date    [name], DO [facility]

## 2023-04-12 ENCOUNTER — Other Ambulatory Visit: Payer: Medicaid Other | Admitting: Obstetrics and Gynecology

## 2023-04-12 ENCOUNTER — Encounter: Payer: Self-pay | Admitting: Obstetrics and Gynecology

## 2023-04-12 NOTE — Patient Instructions (Signed)
Hi April [name], great to speak with you this morning-have a great week !  April [name] was given information about Medicaid Managed Care team care coordination services as a part of their Healthy [facility] Medicaid benefit. April [name] verbally consented to engagement with the [facility] Managed Care team.   If you are experiencing a medical emergency, please call 911 or report to your local emergency department or urgent care.   If you have a non-emergency medical problem during routine business hours, please contact your provider's office and ask to speak with a nurse.   For questions related to your Healthy [facility] health plan, please call: [phone] or visit the homepage here: [email or web address]  If you would like to schedule transportation through your Healthy [facility] plan, please call the following number at least 2 days in advance of your appointment: [phone]  For information about your ride after you set it up, call Ride Assist at [phone]. Use this number to activate a Will Call pickup, or if your transportation is late for a scheduled pickup. Use this number, too, if you need to make a change or cancel a previously scheduled reservation.  If you need transportation services right away, call [phone]. The after-hours call center is staffed 24 hours to handle ride assistance and urgent reservation requests (including discharges) 365 days a year. Urgent trips include sick visits, hospital discharge requests and life-sustaining treatment.  Call the [facility] Line at [phone], at any time, 24 hours a day, 7 days a week. If you are in danger or need immediate medical attention call 911.  If you would like help to quit smoking, call 1-800-QUIT-NOW (418-871-8358) OR Espaol: 1-855-Djelo-Ya (4-742-595-6387) o para ms informacin haga clic aqu or Text READY to 564-332 to register via text  April [name] -  following are the goals we discussed in your visit today:   Goals Addressed    Timeframe:  Long-Range Goal Priority:  High Start Date:         05/22/22                    Expected End Date:  ongoing                   Follow Up 07/13/23   - practice safe sex - schedule appointment for flu shot - schedule appointment for vaccines needed due to my age or health - schedule recommended health tests (blood work, mammogram, colonoscopy, pap test) - schedule and keep appointment for annual check-up    Why is this important?   Screening tests can find diseases early when they are easier to treat.  Your doctor or nurse will talk with you about which tests are important for you.  Getting shots for common diseases like the flu and shingles will help prevent them.  04/12/23-seen by PCP 6/27, follows at [facility].  Upcoming eye appt.  Awaiting sleep study  Patient verbalizes understanding of instructions and care plan provided today and agrees to view in MyChart. Active MyChart status and patient understanding of how to access instructions and care plan via MyChart confirmed with patient.     The Managed Medicaid care management team will reach out to the patient again over the next 90 business  days.  The  Patient has been provided with contact information for the Managed Medicaid care management team and has been advised to call with any health related questions or concerns.   April [name] Therapist, nutritional  RN, BSN [place]  [facility] Care Management Coordinator - Managed [place] High Risk [phone]  Following is a copy of your plan of care:  Care Plan : RN Care Manager Plan of Care  Updates made by [name], RN since 04/12/2023 12:00 AM     Problem: Health Promotion or Disease Self-Management (General Plan of Care)      Long-Range Goal: Chronic Disease Management and Care Coordination Needs/SDOH   Start Date: 05/22/2022  Expected End Date: 07/13/2023   Priority: High  Note:   Current Barriers:  Care Coordination needs related to Limited access to food resources and utility resources-completed. Chronic Disease Management HTN, GERD, osteoarthritis, low back pain, obesity, anxiety/depression, delusional disorder. 04/12/23:  patient hospitalized 6/6-6/7 for syncope.  BP stable-hydrochlorothiazide discontinued and has f/u again with PCP 7/11.  Continues to follow with therapist and [facility].  Has upcoming eye appt and is awaiting sleep study.    RNCM Clinical Goal(s):  Patient will take all medications exactly as prescribed and will call provider for medication related questions as evidenced by patient report demonstrate understanding of rationale for each prescribed medication as evidenced by patient report attend all scheduled medical appointments as evidenced by patient report continue to work with RN Care Manager to address care management and care coordination needs related to SDOH. HTN, GERD, osteoarthritis, LBP, obesity, anxiety/depression as evidenced by adherence to CM Team Scheduled appointments work with [employer] care guide to address needs related to  Limited access to food and utility resources as evidenced by patient and/or community resource care guide support through collaboration with Medical illustrator, provider, and care team.   Interventions: Inter-disciplinary care team collaboration (see longitudinal plan of care) Evaluation of current treatment plan related to  self management and patient's adherence to plan as established by provider Care Guide referral for utility resources and food resources-completed. Collaborated with Care Guide for food and utility resources  Hypertension Interventions:  (Status:  New goal.) Long Term Goal Last practice recorded BP readings:  BP Readings from Last 3 Encounters:  09/08/22 119/69  08/25/22 119/83  08/13/22 118/75  10/08/22          117/9 01/06/23          122/75 04/08/23         109/74  Most recent eGFR/CrCl:  Lab Results  Component Value Date   EGFR 81 07/14/2022    No components found for: "CRCL"  Evaluation of current treatment plan related to hypertension self management and patient's adherence to plan as established by provider Reviewed medications with patient and discussed importance of compliance Discussed plans with patient for ongoing care management follow up and provided patient with direct contact information for care management team Advised patient, providing education and rationale, to monitor blood pressure daily and record, calling PCP for findings outside established parameters Reviewed scheduled/upcoming provider appointments  Assessed social determinant of health barriers  SDOH Barriers (Status:  New goal.) Long Term Goal Patient interviewed and SDOH assessment performed        SDOH Interventions    Flowsheet Row Most Recent Value  SDOH Interventions   Food Insecurity Interventions Other (Comment)  [Care Guide referral]  Transportation Interventions Intervention Not Indicated     Patient interviewed and appropriate assessments performed Referred patient to community resources care guide team for assistance with food and utility resources. Discussed plans with patient for ongoing care management follow up and provided patient with direct contact information  for care management team  Patient Goals/Self-Care Activities: Take all medications as prescribed Attend all scheduled provider appointments Call pharmacy for medication refills 3-7 days in advance of running out of medications Perform all self care activities independently  Perform IADL's (shopping, preparing meals, housekeeping, managing finances) independently Call provider office for new concerns or questions  Patient will contact [employer] and [employer]  Follow Up Plan:  The patient has been provided with contact information for the care management  team and has been advised to call with any health related questions or concerns.  The care management team will reach out to the patient again over the next 90 business  days.

## 2023-04-12 NOTE — Patient Outreach (Signed)
Medicaid Managed Care   Nurse Care Manager Note  04/12/2023 Name:  April [name] MRN:  [id number] DOB:  05-29-1961  April [name] is an 62 y.o. year old female who is a primary patient of Tiffany Kocher, [place].  The [facility] Managed Care Coordination team was consulted for assistance with:    Chronic healthcare management needs, HTN, GERD, osteoarthritis, anxiety/depression, HLD, obesity, Vitamin D deficiency, delusional disorder  [name] was given information about Medicaid Managed Care Coordination team services today. April [name] Patient agreed to services and verbal consent obtained.  Engaged with patient by telephone for follow up visit in response to provider referral for case management and/or care coordination services.   Assessments/Interventions:  Review of past medical history, allergies, medications, health status, including review of consultants reports, laboratory and other test data, was performed as part of comprehensive evaluation and provision of chronic care management services.  SDOH (Social Determinants of Health) assessments and interventions performed: SDOH Interventions    Flowsheet Row Patient Outreach Telephone from 04/12/2023 in [place] [facility]PART[facility] ED to Hosp-Admission (Discharged) from 03/18/2023 in [facility] Patient Outreach Telephone from 01/11/2023 in [place] POPULATION [facility]ALTH DEPARTMENT Patient Outreach Telephone from 11/13/2022 in [place] POPULATION HEALTH DEPARTMENT Patient Outreach Telephone from 10/19/2022 in [place] POPULATION [facility]ALTH DEPARTMENT Patient Outreach Telephone from 09/14/2022 in [place] POPULATION HEALTH DEPARTMENT  SDOH Interventions        Food Insecurity Interventions -- -- -- Intervention Not Indicated -- --  Housing Interventions -- -- -- -- -- Intervention Not Indicated  Transportation Interventions -- -- -- -- Intervention Not Indicated --  Utilities  Interventions -- [id number] Referral, Inpatient TOC Other (Comment)  [patient has resources] -- -- --  Financial Strain Interventions -- -- -- Intervention Not Indicated -- --  Physical Activity Interventions -- -- -- -- Intervention Not Indicated --  Stress Interventions Provide Counseling  [sees therapist] -- -- -- -- --     Care Plan  Allergies  Allergen Reactions   Percocet [Oxycodone-Acetaminophen] Swelling   Naproxen Palpitations    Medications Reviewed Today     Reviewed by [name], RN (Registered Nurse) on 04/12/23 at 0919  Med List Status: <None>   Medication Order Taking? Sig Documenting Provider Last Dose Status Informant  benztropine (COGENTIN) 1 MG tablet [id number]  Take 1 tablet (1 mg total) by mouth daily. [name], MD  Active   cetirizine (ZYRTEC) 10 MG tablet [id number] No Take 1 tablet (10 mg total) by mouth daily.  Patient taking differently: Take 10 mg by mouth every other day.   Carlisle Beers, FNP Taking Active Self  diclofenac Sodium (VOLTAREN) 1 % GEL [id number] No APPLY 2 GRAMS TOPICALLY TO THE AFFECTED AREA FOUR TIMES DAILY  Patient taking differently: 2 g 4 (four) times daily as needed (Apply to affected area for pain).   [name], DO Taking Active Self  haloperidol (HALDOL) 1 MG tablet [id number] No Take 1 tablet (1 mg total) by mouth 2 (two) times daily.  Patient taking differently: Take 1 mg by mouth 2 (two) times daily as needed for agitation.   [name], MD Taking Active Self  topiramate (TOPAMAX) 25 MG tablet [id number]  Take 2 tablets (50 mg total) by mouth at bedtime. [name], [name], PA-C  Active   traMADol (ULTRAM) 50 MG tablet [id number] No Take 1 tablet (50 mg total) by mouth daily.  Patient taking differently:  Take 50 mg by mouth every 6 (six) hours as needed for moderate pain or severe pain.   [name], DO Taking Active Self  Vitamin D, Ergocalciferol, (DRISDOL) 1.25 MG (50000 UNIT) CAPS capsule  [id number]  One Capsule by mouth every 14 days [name], [name], PA-C  Active            Patient Active Problem List   Diagnosis Date Noted   Syncope 03/18/2023   Hypokalemia 03/18/2023   Hyperlipidemia, pure 03/03/2023   Obesity (HCC)- Start BMI 50.0 01/06/2023   BMI 40.0-44.9, adult (HCC) Current BMI 43.4 01/06/2023   Vaginal discharge 11/23/2022   Osteoarthritis of left knee 11/02/2022   Left knee pain 08/25/2022   Polyphagia 08/13/2022   Callus 07/23/2022   Benign nevus of plantar aspect of foot 06/29/2022   Delusional disorder (HCC) 03/14/2022   Pap smear for cervical cancer screening 09/16/2021   Tension headache 01/01/2021   Essential hypertension 08/15/2020   Insulin resistance 07/11/2019   Class 3 severe obesity with serious comorbidity and body mass index (BMI) of 50.0 to 59.9 in adult (HCC) 07/11/2019   Vitamin D deficiency 03/15/2019   Vitiligo 03/03/2018   Insomnia 11/05/2017   Sleep concern 03/02/2016   Osteoarthritis of knees, bilateral 09/23/2015   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   Low back pain 01/27/2012   GERD 03/22/2009   Anxiety state 11/14/2008   Eczema 05/18/2008   Depression 03/10/2007   HYPERCHOLESTEROLEMIA 12/09/2006   Anemia 12/09/2006   Conditions to be addressed/monitored per PCP order:  Chronic healthcare management needs, HTN, GERD, osteoarthritis, anxiety/depression, HLD, obesity, Vitamin D deficiency, delusional disorder  Care Plan : RN Care Manager Plan of Care  Updates made by [name], RN since 04/12/2023 12:00 AM     Problem: Health Promotion or Disease Self-Management (General Plan of Care)      Long-Range Goal: Chronic Disease Management and Care Coordination Needs/SDOH   Start Date: 05/22/2022  Expected End Date: 07/13/2023  Priority: High  Note:   Current Barriers:  Care Coordination needs related to Limited access to food resources and utility resources-completed. Chronic Disease Management HTN, GERD,  osteoarthritis, low back pain, obesity, anxiety/depression, delusional disorder. 04/12/23:  patient hospitalized 6/6-6/7 for syncope.  BP stable-hydrochlorothiazide discontinued and has f/u again with PCP 7/11.  Continues to follow with therapist and [facility].  Has upcoming eye appt and is awaiting sleep study.    RNCM Clinical Goal(s):  Patient will take all medications exactly as prescribed and will call provider for medication related questions as evidenced by patient report demonstrate understanding of rationale for each prescribed medication as evidenced by patient report attend all scheduled medical appointments as evidenced by patient report continue to work with RN Care Manager to address care management and care coordination needs related to SDOH. HTN, GERD, osteoarthritis, LBP, obesity, anxiety/depression as evidenced by adherence to CM Team Scheduled appointments work with [employer] care guide to address needs related to  Limited access to food and utility resources as evidenced by patient and/or community resource care guide support through collaboration with Medical illustrator, provider, and care team.   Interventions: Inter-disciplinary care team collaboration (see longitudinal plan of care) Evaluation of current treatment plan related to  self management and patient's adherence to plan as established by provider Care Guide referral for utility resources and food resources-completed. Collaborated with Care Guide for food and utility resources  Hypertension Interventions:  (Status:  New goal.) Long Term Goal Last  practice recorded BP readings:  BP Readings from Last 3 Encounters:  09/08/22 119/69  08/25/22 119/83  08/13/22 118/75  10/08/22          117/9 01/06/23         122/75 04/08/23         109/74  Most recent eGFR/CrCl:  Lab Results  Component Value Date   EGFR 81 07/14/2022    No components found for: "CRCL"  Evaluation of current treatment  plan related to hypertension self management and patient's adherence to plan as established by provider Reviewed medications with patient and discussed importance of compliance Discussed plans with patient for ongoing care management follow up and provided patient with direct contact information for care management team Advised patient, providing education and rationale, to monitor blood pressure daily and record, calling PCP for findings outside established parameters Reviewed scheduled/upcoming provider appointments  Assessed social determinant of health barriers  SDOH Barriers (Status:  New goal.) Long Term Goal Patient interviewed and SDOH assessment performed        SDOH Interventions    Flowsheet Row Most Recent Value  SDOH Interventions   Food Insecurity Interventions Other (Comment)  [Care Guide referral]  Transportation Interventions Intervention Not Indicated     Patient interviewed and appropriate assessments performed Referred patient to community resources care guide team for assistance with food and utility resources. Discussed plans with patient for ongoing care management follow up and provided patient with direct contact information for care management team  Patient Goals/Self-Care Activities: Take all medications as prescribed Attend all scheduled provider appointments Call pharmacy for medication refills 3-7 days in advance of running out of medications Perform all self care activities independently  Perform IADL's (shopping, preparing meals, housekeeping, managing finances) independently Call provider office for new concerns or questions  Patient will contact [facility] and [employer]  Follow Up Plan:  The patient has been provided with contact information for the care management team and has been advised to call with any health related questions or concerns.  The care management team will reach out to the patient again over the next 90 business  days.    Long-Range Goal: Establish Plan of Care for Chronic Disease Management Needs/SDOH   Priority: High  Note:   Timeframe:  Long-Range Goal Priority:  High Start Date:         05/22/22                    Expected End Date:  ongoing                   Follow Up 07/13/23   - practice safe sex - schedule appointment for flu shot - schedule appointment for vaccines needed due to my age or health - schedule recommended health tests (blood work, mammogram, colonoscopy, pap test) - schedule and keep appointment for annual check-up    Why is this important?   Screening tests can find diseases early when they are easier to treat.  Your doctor or nurse will talk with you about which tests are important for you.  Getting shots for common diseases like the flu and shingles will help prevent them.  04/12/23-seen by PCP 6/27, follows at [facility] monthly.  Upcoming eye appt.  Awaiting sleep study   Follow Up:  Patient agrees to Care Plan and Follow-up.  Plan: The Managed Medicaid care management team will reach out to the patient again over the next 90 business  days. and The  Patient has been provided with contact information for the Managed Medicaid care management team and has been advised to call with any health related questions or concerns.  Date/time of next scheduled RN care management/care coordination outreach:  07/13/23 at 0900.

## 2023-04-13 DIAGNOSIS — F22 Delusional disorders: Secondary | ICD-10-CM | POA: Diagnosis not present

## 2023-04-16 ENCOUNTER — Ambulatory Visit (INDEPENDENT_AMBULATORY_CARE_PROVIDER_SITE_OTHER): Payer: Medicaid Other | Admitting: Student in an Organized Health Care Education/Training Program

## 2023-04-16 VITALS — BP 118/61 | HR 65 | Resp 16 | Wt 227.0 lb

## 2023-04-16 DIAGNOSIS — F1220 Cannabis dependence, uncomplicated: Secondary | ICD-10-CM | POA: Diagnosis not present

## 2023-04-16 DIAGNOSIS — F19959 Other psychoactive substance use, unspecified with psychoactive substance-induced psychotic disorder, unspecified: Secondary | ICD-10-CM

## 2023-04-16 DIAGNOSIS — Z8659 Personal history of other mental and behavioral disorders: Secondary | ICD-10-CM | POA: Diagnosis not present

## 2023-04-16 DIAGNOSIS — F29 Unspecified psychosis not due to a substance or known physiological condition: Secondary | ICD-10-CM | POA: Diagnosis not present

## 2023-04-16 HISTORY — DX: Other psychoactive substance use, unspecified with psychoactive substance-induced psychotic disorder, unspecified: F19.959

## 2023-04-16 MED ORDER — HALOPERIDOL 1 MG PO TABS
1.0000 mg | ORAL_TABLET | Freq: Every day | ORAL | 2 refills | Status: DC
Start: 2023-04-16 — End: 2023-10-22

## 2023-04-16 MED ORDER — BENZTROPINE MESYLATE 1 MG PO TABS
1.0000 mg | ORAL_TABLET | Freq: Every day | ORAL | 2 refills | Status: DC
Start: 2023-04-16 — End: 2023-10-22
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
## 2023-04-19 ENCOUNTER — Other Ambulatory Visit: Payer: Self-pay

## 2023-04-19 MED ORDER — DICLOFENAC SODIUM 1 % EX GEL: 2.0000 g | Freq: Four times a day (QID) | CUTANEOUS | 0 refills | Status: AC | PRN

## 2023-04-22 ENCOUNTER — Ambulatory Visit: Payer: Medicaid Other | Admitting: Student

## 2023-04-22 ENCOUNTER — Encounter: Payer: Self-pay | Admitting: Student

## 2023-04-22 VITALS — BP 127/89 | HR 60 | Ht 60.0 in | Wt 228.1 lb

## 2023-04-22 DIAGNOSIS — I10 Essential (primary) hypertension: Secondary | ICD-10-CM | POA: Diagnosis not present

## 2023-04-22 DIAGNOSIS — L80 Vitiligo: Secondary | ICD-10-CM | POA: Diagnosis not present

## 2023-04-22 NOTE — Assessment & Plan Note (Signed)
BP controlled without medication. Hydrochlorothiazide D/C'd d/t orthostatic vitals in June. Does not have means to measure BP at home, and would like follow-up. -No medication at this time -Return and ED precautions provided -Follow-up scheduled

## 2023-04-22 NOTE — Progress Notes (Signed)
    SUBJECTIVE:   CHIEF COMPLAINT / HPI:   Hypertension Dizziness and feinting have resolved after stopping hydrochlorothiazide on June 27. No chest pain, SOB, exercise intolerance, NV, edema. Patient reports no other HTN medications. Has been staying hydrated well. BP today WNL.  Vitiligo  Would like to see dermatologist locally, as driving to [facility] is cumbersome.   OBJECTIVE:   BP 127/89   Pulse 60   Ht 5' (1.524 m)   Wt 228 lb 2 oz (103.5 kg)   SpO2 100%   BMI 44.55 kg/m    General: NAD, pleasant Cardio: RRR, no MRG. Cap Refill <2s. Respiratory: CTAB, normal wob on RA Extremities: No edema Skin: Warm and dry  ASSESSMENT/PLAN:   Essential hypertension BP controlled without medication. Hydrochlorothiazide D/C'd d/t orthostatic vitals in June. Does not have means to measure BP at home, and would like follow-up. -No medication at this time -Return and ED precautions provided -Follow-up scheduled   Vitiligo -Derm referral for local practice sent  Follow-up recommendations Discuss health maintenance care gaps  Tiffany Kocher, DO [facility]

## 2023-04-22 NOTE — Patient Instructions (Addendum)
It was great to see you! Thank you for allowing me to participate in your care!   I recommend that you always bring your medications to each appointment as this makes it easy to ensure we are on the correct medications and helps [place] not miss when refills are needed.  Our plans for today:  - Follow-up in June 18, 2023 @ 1:30 pm - I have sent a dermatology referral   Take care and seek immediate care sooner if you develop any concerns. Please remember to show up 15 minutes before your scheduled appointment time!  Tiffany Kocher, DO [facility]

## 2023-04-28 ENCOUNTER — Ambulatory Visit (INDEPENDENT_AMBULATORY_CARE_PROVIDER_SITE_OTHER): Payer: Medicaid Other | Admitting: Physician Assistant

## 2023-04-28 ENCOUNTER — Encounter (INDEPENDENT_AMBULATORY_CARE_PROVIDER_SITE_OTHER): Payer: Self-pay | Admitting: Physician Assistant

## 2023-04-28 VITALS — BP 114/78 | HR 67 | Temp 98.2°F | Ht 60.0 in | Wt 223.0 lb

## 2023-04-28 DIAGNOSIS — E559 Vitamin D deficiency, unspecified: Secondary | ICD-10-CM

## 2023-04-28 DIAGNOSIS — I10 Essential (primary) hypertension: Secondary | ICD-10-CM | POA: Diagnosis not present

## 2023-04-28 DIAGNOSIS — R632 Polyphagia: Secondary | ICD-10-CM | POA: Diagnosis not present

## 2023-04-28 DIAGNOSIS — R7303 Prediabetes: Secondary | ICD-10-CM | POA: Diagnosis not present

## 2023-04-28 DIAGNOSIS — E669 Obesity, unspecified: Secondary | ICD-10-CM | POA: Diagnosis not present

## 2023-04-28 DIAGNOSIS — Z6841 Body Mass Index (BMI) 40.0 and over, adult: Secondary | ICD-10-CM

## 2023-04-28 NOTE — Progress Notes (Signed)
.smr  Office: [phone]  /  Fax: [phone]  WEIGHT SUMMARY AND BIOMETRICS  Vitals Temp: 98.2 F (36.8 C) BP: 114/78 Pulse Rate: 67 SpO2: 100 %   Anthropometric Measurements Height: 5' (1.524 m) Weight: 223 lb (101.2 kg) BMI (Calculated): 43.55 Weight at Last Visit: 224lb Weight Lost Since Last Visit: 1lb Weight Gained Since Last Visit: 0lb Starting Weight: 256lb Total Weight Loss (lbs): 32 lb (14.5 kg)   Body Composition  Body Fat %: 44.4 % Fat Mass (lbs): 99 lbs Muscle Mass (lbs): 117.8 lbs Total Body Water (lbs): 78.6 lbs Visceral Fat Rating : 15   Other Clinical Data Fasting: no Labs: no Today's Visit #: 39 Starting Date: 06/13/19     HPI  Chief Complaint: OBESITY  [name] is here to discuss her progress with her obesity treatment plan. She is on the the Category 2 Plan and keeping a food journal and adhering to recommended goals of 1200-1300 calories and 85 grams of  protein and states she is following her eating plan approximately 80 % of the time. She states she is exercising [employer] walking/stretching 40 minutes 2 times per week.  Discussed the use of AI scribe software for clinical note transcription with the patient, who gave verbal consent to proceed.  History of Present Illness     The patient, with a history of obesity, prediabetes, polyphagia, hypertension, and vitamin D deficiency, presents for a follow-up visit. She reports fluctuations in hunger and appetite, which she has been managing with Topamax. The patient has been trying to increase their muscle mass through regular exercise at the local [employer] and manage their cravings for unhealthy snacks. She has been successful in increasing their muscle mass and reducing their adipose tissue, as evidenced by their body composition measurements. However, she expresses frustration at feeling "stuck" in her weight loss journey, with a goal to reduce their weight to 215 pounds. The patient also mentions  taking benztropine (Cogentin) for anxiety, which she reports may be contributing to feelings of fatigue.      Interval History:  Since last office visit she down 1 lb. Bioimpedance scale reviewed with patient: Up 12.2 pounds muscle mass Down 13.6 pounds adipose mass Up 0.8 pounds total body water  Hunger/appetite-moderate control She is journaling daily and generally between 1100 to 1400 cal at max/protein 72 to 80 g with a high of 102 g. Cravings-not excessive [facility]  We specifically discussed working on some strengthening with weights 2-3 times a week to continue to work on muscle mass maintenance/increase. Hydration-she reports trying to get at least 64 ounces of water daily.   Pharmacotherapy: Topamax 25 mg at bedtime for cravings/emotional eating.  She reports no side effects with the medication but did not increase the dose as she was concerned about kidney stones.  She has not had a history of kidney stones in the past however.  TREATMENT PLAN FOR OBESITY: Obesity: Patient reports variable appetite and cravings. She has been maintaining a food log and has been generally successful in keeping her caloric intake within the recommended range. She has been exercising regularly and has seen an increase in muscle mass and a decrease in adipose tissue. She is currently on Topamax for appetite control. -Continue Topamax, consider increasing dose from 25mg  to 50mg  at bedtime if tolerated. -Encourage continued regular exercise and strength training 2-3 times a week. -Continue to monitor food intake and aim for at least 85 grams of protein daily. -  Schedule follow-up appointment in 4 weeks.  Recommended Dietary Goals  [name] is currently in the action stage of change. As such, her goal is to continue weight management plan. She has agreed to the Category 2 Plan and keeping a food journal and adhering to recommended goals of 1200-1300 calories and 85  grams of protein.  Behavioral Intervention  We discussed the following Behavioral Modification Strategies today: increasing lean protein intake, decreasing simple carbohydrates , increasing vegetables, increasing lower glycemic fruits, avoiding skipping meals, increasing water intake, work on tracking and journaling calories using tracking application, emotional eating strategies and understanding the difference between hunger signals and cravings, continue to practice mindfulness when eating, and planning for success.  Additional resources provided today: NA  Recommended Physical Activity Goals  [name] has been advised to work up to 150 minutes of moderate intensity aerobic activity a week and strengthening exercises 2-3 times per week for cardiovascular health, weight loss maintenance and preservation of muscle mass.   She has agreed to Continue current level of physical activity  and Start strengthening exercises with a goal of 2-3 sessions a week    Pharmacotherapy We discussed various medication options to help [name] with her weight loss efforts and we both agreed to continue Topamax 25 mg at bedtime and if she feels comfortable she can increase to 50 mg at bedtime for cravings/emotional eating..    Return in about 4 weeks (around 05/26/2023).[name] Kitchen She was informed of the importance of frequent follow up visits to maximize her success with intensive lifestyle modifications for her multiple health conditions.  PHYSICAL EXAM:  Blood pressure 114/78, pulse 67, temperature 98.2 F (36.8 C), height 5' (1.524 m), weight 223 lb (101.2 kg), SpO2 100%. Body mass index is 43.55 kg/m.  General: She is overweight, cooperative, alert, well developed, and in no acute distress. PSYCH: Has normal mood, affect and thought process.   Cardiovascular: HR67, regular, BP 114/78.  No peripheral edema. Lungs: Normal breathing effort, no conversational dyspnea. Neuro: No focal deficit  DIAGNOSTIC DATA  REVIEWED:  BMET    Component Value Date/Time   NA 138 03/19/2023 0346   NA 136 02/05/2023 1004   K 3.7 03/19/2023 0346   CL 104 03/19/2023 0346   CO2 25 03/19/2023 0346   GLUCOSE 98 03/19/2023 0346   BUN 19 03/19/2023 0346   BUN 19 02/05/2023 1004   CREATININE 0.70 03/19/2023 0346   CREATININE 0.87 03/02/2016 1137   CALCIUM 8.6 (L) 03/19/2023 0346   GFRNONAA >60 03/19/2023 0346   GFRNONAA 76 03/02/2016 1137   GFRAA 111 11/06/2020 0907   GFRAA 88 03/02/2016 1137   Lab Results  Component Value Date   HGBA1C 5.8 (H) 02/05/2023   HGBA1C 5.5 12/01/2012   Lab Results  Component Value Date   INSULIN 9.1 02/05/2023   INSULIN 9.5 06/13/2019   Lab Results  Component Value Date   TSH 0.934 03/18/2023   CBC    Component Value Date/Time   WBC 7.0 03/19/2023 0346   RBC 4.48 03/19/2023 0346   HGB 12.6 03/19/2023 0346   HGB 13.7 02/05/2023 1004   HCT 38.7 03/19/2023 0346   HCT 41.8 02/05/2023 1004   PLT 306 03/19/2023 0346   PLT 341 02/05/2023 1004   MCV 86.4 03/19/2023 0346   MCV 86 02/05/2023 1004   MCH 28.1 03/19/2023 0346   MCHC 32.6 03/19/2023 0346   RDW 15.6 (H) 03/19/2023 0346   RDW 14.1 02/05/2023 1004   Iron Studies  Component Value Date/Time   IRON 36 (L) 12/11/2009 1848   TIBC 318 12/11/2009 1848   FERRITIN 19 12/11/2009 1848   IRONPCTSAT 11 (L) 12/11/2009 1848   Lipid Panel     Component Value Date/Time   CHOL 200 (H) 02/05/2023 1004   TRIG 68 02/05/2023 1004   HDL 58 02/05/2023 1004   CHOLHDL 3.8 03/13/2022 1548   VLDL 17 03/13/2022 1548   LDLCALC 130 (H) 02/05/2023 1004   LDLDIRECT 153 (H) 08/16/2020 1007   LDLDIRECT 108 (H) 12/01/2012 1506   Hepatic Function Panel     Component Value Date/Time   PROT 7.2 03/19/2023 0346   PROT 7.7 02/05/2023 1004   ALBUMIN 3.4 (L) 03/19/2023 0346   ALBUMIN 4.4 02/05/2023 1004   AST 16 03/19/2023 0346   ALT 12 03/19/2023 0346   ALKPHOS 96 03/19/2023 0346   BILITOT 0.4 03/19/2023 0346   BILITOT 0.3  02/05/2023 1004      Component Value Date/Time   TSH 0.934 03/18/2023 1900   TSH 1.410 02/05/2023 1004   Nutritional Lab Results  Component Value Date   VD25OH 60.0 02/05/2023   VD25OH 79.9 11/06/2021   VD25OH 65.2 04/07/2021    ASSOCIATED CONDITIONS ADDRESSED TODAY  ASSESSMENT AND PLAN  Problem List Items Addressed This Visit     Vitamin D deficiency   Essential hypertension   Polyphagia   Obesity (HCC)- Start BMI 50.0   Other Visit Diagnoses     Prediabetes    -  Primary     Prediabetes Last A1c was 5.8/Insulin 9.1- not at goals.   Medication(s): None Polyphagia:No She is working on nutrition plan to decrease simple carbohydrates, increase lean proteins and exercise to promote weight loss, improve glycemic control and prevent progression to Type 2 diabetes.   Lab Results  Component Value Date   HGBA1C 5.8 (H) 02/05/2023   HGBA1C 5.5 03/13/2022   HGBA1C 5.6 11/06/2021   HGBA1C 5.5 04/29/2021   HGBA1C 7.3 (H) 04/07/2021   Lab Results  Component Value Date   INSULIN 9.1 02/05/2023   INSULIN 7.3 11/06/2021   INSULIN 3.6 04/07/2021   INSULIN 13.8 11/06/2020   INSULIN 11.6 07/10/2020    Plan:  Continue working on nutrition plan to decrease simple carbohydrates, increase lean proteins and exercise to promote weight loss, improve glycemic control and prevent progression to Type 2 diabetes.  Prediabetes: No specific discussion in this visit, but patient has been working on lifestyle modifications for weight loss which will also benefit her prediabetes. -Continue monitoring.  Polyphagia Currently this is moderately controlled. Medication(s): Topiramate 25 mg nightly at bedtime .  Reported side effects: None  Plan: Continue Topiramate 25 mg nightly and increase to 50 mg nightly if tolerates well. Continue to work on nutrition plan and exercise to promote weight loss.   Hypertension: Patient's blood pressure is well-controlled without the need for  hydrochlorothiazide at this time.She is following up regular with her PCP and will continue to monitor at home. She has stopped drinking as many caffeinated energy drinks. Maybe drinking 1 daily at this point and we discussed keeping at a maximum of 1 energy drink daily.  -Continue monitoring.  Vitamin D Deficiency Vitamin D is at goal of 50.  Most recent vitamin D level was 60.0. She is on  prescription ergocalciferol 50,000 IU every 14 days. Lab Results  Component Value Date   VD25OH 60.0 02/05/2023   VD25OH 79.9 11/06/2021   VD25OH 65.2 04/07/2021    Plan: Continue  prescription ergocalciferol 50,000 IU every 14 days No refill needed this visit. Low vitamin D levels can be associated with adiposity and may result in leptin resistance and weight gain. Also associated with fatigue. Currently on vitamin D supplementation without any adverse effects.  Recheck vitamin D levels 3-4 times yearly to optimize supplemenation.    ATTESTASTION STATEMENTS:  Reviewed by clinician on day of visit: allergies, medications, problem list, medical history, surgical history, family history, social history, and previous encounter notes.   I have personally spent 45 minutes total time today in preparation, patient care, nutritional counseling and documentation for this visit, including the following: review of clinical lab tests; review of medical tests/procedures/services.      April Gutt, PA-C

## 2023-04-29 DIAGNOSIS — R7303 Prediabetes: Secondary | ICD-10-CM | POA: Insufficient documentation

## 2023-04-29 DIAGNOSIS — F22 Delusional disorders: Secondary | ICD-10-CM | POA: Diagnosis not present

## 2023-05-10 NOTE — Telephone Encounter (Signed)
NPSG- patient is scheduled at [facility] for 07/20/23 at 8pm.  Healthy blue Berkley Harvey: [id number] (exp. 04/02/23 to 05/31/23)  The Berkley Harvey will expire before she has the study I will redo the auth.  Mailed the packet to the patient.

## 2023-05-13 DIAGNOSIS — F22 Delusional disorders: Secondary | ICD-10-CM | POA: Diagnosis not present

## 2023-05-25 DIAGNOSIS — M1712 Unilateral primary osteoarthritis, left knee: Secondary | ICD-10-CM | POA: Diagnosis not present

## 2023-05-27 ENCOUNTER — Other Ambulatory Visit: Payer: Self-pay

## 2023-05-27 DIAGNOSIS — F22 Delusional disorders: Secondary | ICD-10-CM | POA: Diagnosis not present

## 2023-05-28 MED ORDER — TRAMADOL HCL 50 MG PO TABS: 50.0000 mg | ORAL_TABLET | Freq: Every day | ORAL | 0 refills | Status: DC

## 2023-05-31 ENCOUNTER — Ambulatory Visit (INDEPENDENT_AMBULATORY_CARE_PROVIDER_SITE_OTHER): Payer: Medicaid Other | Admitting: Physician Assistant

## 2023-05-31 DIAGNOSIS — I10 Essential (primary) hypertension: Secondary | ICD-10-CM

## 2023-05-31 DIAGNOSIS — E559 Vitamin D deficiency, unspecified: Secondary | ICD-10-CM

## 2023-05-31 DIAGNOSIS — R7303 Prediabetes: Secondary | ICD-10-CM

## 2023-06-02 ENCOUNTER — Ambulatory Visit (INDEPENDENT_AMBULATORY_CARE_PROVIDER_SITE_OTHER): Payer: Medicaid Other | Admitting: Physician Assistant

## 2023-06-02 ENCOUNTER — Encounter (INDEPENDENT_AMBULATORY_CARE_PROVIDER_SITE_OTHER): Payer: Self-pay | Admitting: Physician Assistant

## 2023-06-02 VITALS — BP 112/75 | HR 75 | Temp 98.5°F | Ht 60.0 in | Wt 222.0 lb

## 2023-06-02 DIAGNOSIS — R632 Polyphagia: Secondary | ICD-10-CM

## 2023-06-02 DIAGNOSIS — E559 Vitamin D deficiency, unspecified: Secondary | ICD-10-CM | POA: Diagnosis not present

## 2023-06-02 DIAGNOSIS — E669 Obesity, unspecified: Secondary | ICD-10-CM

## 2023-06-02 DIAGNOSIS — R7303 Prediabetes: Secondary | ICD-10-CM | POA: Diagnosis not present

## 2023-06-02 DIAGNOSIS — Z6841 Body Mass Index (BMI) 40.0 and over, adult: Secondary | ICD-10-CM | POA: Diagnosis not present

## 2023-06-02 MED ORDER — VITAMIN D (ERGOCALCIFEROL) 1.25 MG (50000 UNIT) PO CAPS
ORAL_CAPSULE | ORAL | 0 refills | Status: DC
Start: 2023-06-02 — End: 2023-07-07

## 2023-06-02 NOTE — Progress Notes (Signed)
.smr  Office: [phone]  /  Fax: [phone]  WEIGHT SUMMARY AND BIOMETRICS  Vitals Temp: 98.5 F (36.9 C) BP: 112/75 Pulse Rate: 75 SpO2: 100 %   Anthropometric Measurements Height: 5' (1.524 m) Weight: 222 lb (100.7 kg) BMI (Calculated): 43.36 Weight at Last Visit: 223lb Weight Lost Since Last Visit: 1lb Weight Gained Since Last Visit: 0lb Starting Weight: 256lb Total Weight Loss (lbs): 33 lb (15 kg)   Body Composition  Body Fat %: 45.5 % Fat Mass (lbs): 101 lbs Muscle Mass (lbs): 114.8 lbs Total Body Water (lbs): 76.2 lbs Visceral Fat Rating : 16   Other Clinical Data Fasting: no Labs: no Today's Visit #: 70 Starting Date: 06/13/19     HPI  Chief Complaint: OBESITY  April [name] is here to discuss her progress with her obesity treatment plan. She is on the the Category 2 Plan and states she is following her eating plan approximately 80-85 % of the time. She states she is exercising [employer]/walking 40-60 minutes 2 times per week.   Interval History:  Since last office visit she is down 1 lb.  She has been volunteering at [facility] for a cultural history program about once weekly and is very excited about this program.  Hunger/appetite-moderate control Cravings- denies excessive cravings Stress- Feels like stress level is better overall Sleep- Has sleep study coming up to rule out sleep apnea Exercise-Trying to be more active.  Hydration-Working on increasing water intake.    Pharmacotherapy: Topamax 25 mg at bedtime for cravings/emotional eating. Does not always remember to take, but does feel it is helping to reduce cravings. She reports no side effects with the medication but did not increase the dose as she was concerned about kidney stones. She has not had a history of kidney stones in the past however.   TREATMENT PLAN FOR OBESITY: Total weight loss of 33 lbs TBW loss of 12.9% Wants to get at least to 215 lbs.  Recommended Dietary Goals  April [name] is  currently in the action stage of change. As such, her goal is to continue weight management plan. She has agreed to the Category 2 Plan.  Behavioral Intervention  We discussed the following Behavioral Modification Strategies today: increasing lean protein intake, decreasing simple carbohydrates , increasing vegetables, increasing lower glycemic fruits, increasing water intake, emotional eating strategies and understanding the difference between hunger signals and cravings, work on managing stress, creating time for self-care and relaxation measures, continue to practice mindfulness when eating, and planning for success.  Additional resources provided today: NA  Recommended Physical Activity Goals  April [name] has been advised to work up to 150 minutes of moderate intensity aerobic activity a week and strengthening exercises 2-3 times per week for cardiovascular health, weight loss maintenance and preservation of muscle mass.   She has agreed to Continue current level of physical activity  and Think about ways to increase daily physical activity and overcoming barriers to exercise   Pharmacotherapy We discussed various medication options to help April [name] with her weight loss efforts and we both agreed to continue topamax for emotional eating/cravings.    Return in about 5 weeks (around 07/07/2023).[name] Kitchen She was informed of the importance of frequent follow up visits to maximize her success with intensive lifestyle modifications for her multiple health conditions.  PHYSICAL EXAM:  Blood pressure 112/75, pulse 75, temperature 98.5 F (36.9 C), height 5' (1.524 m), weight 222 lb (100.7 kg), SpO2 100%. Body mass index is 43.36 kg/m.  General: She is overweight,  cooperative, alert, well developed, and in no acute distress. PSYCH: Has normal mood, affect and thought process.   Cardiovascular: HR 70's BP 112/75 Lungs: Normal breathing effort, no conversational dyspnea.   DIAGNOSTIC DATA  REVIEWED:  BMET    Component Value Date/Time   NA 138 03/19/2023 0346   NA 136 02/05/2023 1004   K 3.7 03/19/2023 0346   CL 104 03/19/2023 0346   CO2 25 03/19/2023 0346   GLUCOSE 98 03/19/2023 0346   BUN 19 03/19/2023 0346   BUN 19 02/05/2023 1004   CREATININE 0.70 03/19/2023 0346   CREATININE 0.87 03/02/2016 1137   CALCIUM 8.6 (L) 03/19/2023 0346   GFRNONAA >60 03/19/2023 0346   GFRNONAA 76 03/02/2016 1137   GFRAA 111 11/06/2020 0907   GFRAA 88 03/02/2016 1137   Lab Results  Component Value Date   HGBA1C 5.8 (H) 02/05/2023   HGBA1C 5.5 12/01/2012   Lab Results  Component Value Date   INSULIN 9.1 02/05/2023   INSULIN 9.5 06/13/2019   Lab Results  Component Value Date   TSH 0.934 03/18/2023   CBC    Component Value Date/Time   WBC 7.0 03/19/2023 0346   RBC 4.48 03/19/2023 0346   HGB 12.6 03/19/2023 0346   HGB 13.7 02/05/2023 1004   HCT 38.7 03/19/2023 0346   HCT 41.8 02/05/2023 1004   PLT 306 03/19/2023 0346   PLT 341 02/05/2023 1004   MCV 86.4 03/19/2023 0346   MCV 86 02/05/2023 1004   MCH 28.1 03/19/2023 0346   MCHC 32.6 03/19/2023 0346   RDW 15.6 (H) 03/19/2023 0346   RDW 14.1 02/05/2023 1004   Iron Studies    Component Value Date/Time   IRON 36 (L) 12/11/2009 1848   TIBC 318 12/11/2009 1848   FERRITIN 19 12/11/2009 1848   IRONPCTSAT 11 (L) 12/11/2009 1848   Lipid Panel     Component Value Date/Time   CHOL 200 (H) 02/05/2023 1004   TRIG 68 02/05/2023 1004   HDL 58 02/05/2023 1004   CHOLHDL 3.8 03/13/2022 1548   VLDL 17 03/13/2022 1548   LDLCALC 130 (H) 02/05/2023 1004   LDLDIRECT 153 (H) 08/16/2020 1007   LDLDIRECT 108 (H) 12/01/2012 1506   Hepatic Function Panel     Component Value Date/Time   PROT 7.2 03/19/2023 0346   PROT 7.7 02/05/2023 1004   ALBUMIN 3.4 (L) 03/19/2023 0346   ALBUMIN 4.4 02/05/2023 1004   AST 16 03/19/2023 0346   ALT 12 03/19/2023 0346   ALKPHOS 96 03/19/2023 0346   BILITOT 0.4 03/19/2023 0346   BILITOT 0.3  02/05/2023 1004      Component Value Date/Time   TSH 0.934 03/18/2023 1900   TSH 1.410 02/05/2023 1004   Nutritional Lab Results  Component Value Date   VD25OH 60.0 02/05/2023   VD25OH 79.9 11/06/2021   VD25OH 65.2 04/07/2021    ASSOCIATED CONDITIONS ADDRESSED TODAY  ASSESSMENT AND PLAN  Problem List Items Addressed This Visit     Vitamin D deficiency - Primary   Relevant Medications   Vitamin D, Ergocalciferol, (DRISDOL) 1.25 MG (50000 UNIT) CAPS capsule   Polyphagia   Obesity (HCC)- Start BMI 50.0   BMI 40.0-44.9, adult (HCC) Current BMI 43.4   Prediabetes   Vitamin D Deficiency Vitamin D is at goal of 50.  Most recent vitamin D level was 60.0. She is on  prescription ergocalciferol 50,000 IU every 14 days. No N/V or muscle weakness with Ergocalciferol  Lab Results  Component Value  Date   VD25OH 60.0 02/05/2023   VD25OH 79.9 11/06/2021   VD25OH 65.2 04/07/2021    Plan: Continue and refill  prescription ergocalciferol 50,000 IU every 14 days Low vitamin D levels can be associated with adiposity and may result in leptin resistance and weight gain. Also associated with fatigue. Currently on vitamin D supplementation without any adverse effects.  Recheck vitamin D level over next 1-2 months to avoid over supplementation.   Prediabetes Last A1c was 5.8- not at goal. Insulin 9.1- not at goal.   Medication(s): None Polyphagia:No She is working on nutrition plan to decrease simple carbohydrates, increase lean proteins and exercise to promote weight loss, improve glycemic control and prevent progression to Type 2 diabetes.   Lab Results  Component Value Date   HGBA1C 5.8 (H) 02/05/2023   HGBA1C 5.5 03/13/2022   HGBA1C 5.6 11/06/2021   HGBA1C 5.5 04/29/2021   HGBA1C 7.3 (H) 04/07/2021   Lab Results  Component Value Date   INSULIN 9.1 02/05/2023   INSULIN 7.3 11/06/2021   INSULIN 3.6 04/07/2021   INSULIN 13.8 11/06/2020   INSULIN 11.6 07/10/2020    Plan:   Continue working on nutrition plan to decrease simple carbohydrates, increase lean proteins and exercise to promote weight loss, improve glycemic control and prevent progression to Type 2 diabetes.  Plan to recheck fasting labs in next 1-2 months.   Polyphagia Currently this is moderately controlled. Medication(s): Topiramate 25 mg at bedtime .  Reported side effects: None  Plan: Continue Topiramate 25 mg at bedtime nightly  No refill needed this visit.  Discussed trying to take consistently at night as she does feel it is helpful to decrease her cravings.   ATTESTASTION STATEMENTS:  Reviewed by clinician on day of visit: allergies, medications, problem list, medical history, surgical history, family history, social history, and previous encounter notes.   I have personally spent 30 minutes total time today in preparation, patient care, nutritional counseling and documentation for this visit, including the following: review of clinical lab tests; review of medical tests/procedures/services.      Gwenn Teodoro, PA-C

## 2023-06-10 DIAGNOSIS — F22 Delusional disorders: Secondary | ICD-10-CM | POA: Diagnosis not present

## 2023-06-10 NOTE — Telephone Encounter (Signed)
Updated insurance auth:   NPSG- [employer] Healthy blue Berkley Harvey: [id number] (exp. 07/20/23 to 09/17/23)

## 2023-06-18 ENCOUNTER — Ambulatory Visit: Payer: Self-pay | Admitting: Student

## 2023-06-24 DIAGNOSIS — F22 Delusional disorders: Secondary | ICD-10-CM | POA: Diagnosis not present

## 2023-06-25 ENCOUNTER — Encounter: Payer: Self-pay | Admitting: Student

## 2023-06-25 ENCOUNTER — Ambulatory Visit: Payer: Medicaid Other | Admitting: Student

## 2023-06-25 VITALS — BP 131/80 | HR 68 | Ht 60.0 in | Wt 225.6 lb

## 2023-06-25 DIAGNOSIS — M25562 Pain in left knee: Secondary | ICD-10-CM | POA: Diagnosis not present

## 2023-06-25 DIAGNOSIS — Z23 Encounter for immunization: Secondary | ICD-10-CM | POA: Diagnosis not present

## 2023-06-25 NOTE — Assessment & Plan Note (Signed)
Acute on chronic left knee pain. Suspect meniscal injury, may also have component of acute arthritic pain. S/P steroid injection 1 month ago, followed by [name].  -RICE -OTC tylenol, ibuprofen -Recommend PT, will hold off per patient preference -Follow-up in 2-4 weeks with PCP or Orthopedic surgeon -Consider MRI if worsening symptoms

## 2023-06-25 NOTE — Patient Instructions (Addendum)
It was great to see you! Thank you for allowing me to participate in your care!   I recommend that you always bring your medications to each appointment as this makes it easy to ensure we are on the correct medications and helps [place] not miss when refills are needed.  Our plans for today:  - Please use ice, rest, and compression for your knee. Elevate the knee when possible. Wear your compression sleeve. Use tylenol and ibuprofen for pain.  - Follow-up in 2 weeks with me or your orthopedic surgeon in 2 weeks if not improving   Please call the dermatologist at [facility] [phone]  We are checking some labs today, I will call you if they are abnormal will send you a MyChart message or a letter if they are normal.  If you do not hear about your labs in the next 2 weeks please let us know.  Take care and seek immediate care sooner if you develop any concerns. Please remember to show up 15 minutes before your scheduled appointment time!  Tiffany Kocher, DO [facility]

## 2023-06-25 NOTE — Progress Notes (Signed)
    SUBJECTIVE:   CHIEF COMPLAINT / HPI:   HTN Recently stopped hydrochlorothiazide on June 27 due to dizziness and fainting found to have orthostatic hypotension.  Blood pressure recheck today is within normal limits.  We will hold off on blood pressure medication at this time.  Knee pain Left Patient has acute on chronic left knee pain.  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  Left knee pain started approximately 3 days ago, denies any known injury.  She does report that she has been in the gym more and lifting more weights.  She has had a 30 pound weight loss, which is fantastic, followed closely by [facility].  She recently had a corticosteroid injection of her left knee within the past month.  Pain is primarily on the lateral aspect of the knee.  She does endorse pain worse at night, feels the knee is swollen and will occasionally be difficult to flex.  The knee has not locked nor buckled under her weight.  She has not tried any medical therapy outside of her chronic pain medication.   OBJECTIVE:   BP 131/80   Pulse 68   Ht 5' (1.524 m)   Wt 225 lb 9.6 oz (102.3 kg)   SpO2 100%   BMI 44.06 kg/m    General: NAD, pleasant Cardio: RRR, no MRG. Cap Refill <2s. Respiratory: CTAB, normal wob on RA  Left knee: No gross deformity, no ecchymosis, small effusion present on exam.  TTP over lateral aspect of knee joint.  Full range of motion.  5/5 strength and normal sensation. FADIR/FABER negative for hip pain Stable joint with valgus/varus stressing Negative anterior/posterior drawer Negative Ober Weak hip abductors Positive McMurray and [place]ssaly  ASSESSMENT/PLAN:   Assessment & Plan Acute pain of left knee Acute on chronic left knee pain. Suspect meniscal injury, may also have component of acute arthritic pain. S/P steroid injection 1 month ago, followed by [facility].  -RICE -OTC tylenol,  ibuprofen -Recommend PT, will hold off per patient preference -Follow-up in 2-4 weeks with PCP or Orthopedic surgeon -Consider MRI if worsening symptoms Encounter for immunization Patient received covid-19 vaccination  Tiffany Kocher, DO [facility]

## 2023-07-01 DIAGNOSIS — M1712 Unilateral primary osteoarthritis, left knee: Secondary | ICD-10-CM | POA: Diagnosis not present

## 2023-07-02 ENCOUNTER — Encounter (HOSPITAL_COMMUNITY): Payer: Medicaid Other | Admitting: Student in an Organized Health Care Education/Training Program

## 2023-07-07 ENCOUNTER — Encounter (INDEPENDENT_AMBULATORY_CARE_PROVIDER_SITE_OTHER): Payer: Self-pay | Admitting: Physician Assistant

## 2023-07-07 ENCOUNTER — Ambulatory Visit (INDEPENDENT_AMBULATORY_CARE_PROVIDER_SITE_OTHER): Payer: Medicaid Other | Admitting: Physician Assistant

## 2023-07-07 VITALS — BP 105/71 | HR 58 | Temp 98.1°F | Ht 60.0 in | Wt 223.0 lb

## 2023-07-07 DIAGNOSIS — R632 Polyphagia: Secondary | ICD-10-CM | POA: Diagnosis not present

## 2023-07-07 DIAGNOSIS — Z6841 Body Mass Index (BMI) 40.0 and over, adult: Secondary | ICD-10-CM | POA: Diagnosis not present

## 2023-07-07 DIAGNOSIS — E559 Vitamin D deficiency, unspecified: Secondary | ICD-10-CM | POA: Diagnosis not present

## 2023-07-07 DIAGNOSIS — E669 Obesity, unspecified: Secondary | ICD-10-CM | POA: Diagnosis not present

## 2023-07-07 DIAGNOSIS — M1712 Unilateral primary osteoarthritis, left knee: Secondary | ICD-10-CM

## 2023-07-07 MED ORDER — VITAMIN D (ERGOCALCIFEROL) 1.25 MG (50000 UNIT) PO CAPS: ORAL_CAPSULE | ORAL | 0 refills | Status: DC

## 2023-07-07 NOTE — Progress Notes (Signed)
.smr  Office: [phone]  /  Fax: [phone]  WEIGHT SUMMARY AND BIOMETRICS  Vitals Temp: 98.1 F (36.7 C) BP: 105/71 Pulse Rate: (!) 58 SpO2: 99 %   Anthropometric Measurements Height: 5' (1.524 m) Weight: 223 lb (101.2 kg) BMI (Calculated): 43.55 Weight at Last Visit: 222 lb Weight Lost Since Last Visit: 0 Weight Gained Since Last Visit: 1 lb Starting Weight: 256 lb Total Weight Loss (lbs): 33 lb (15 kg) Peak Weight: 268 lb   Body Composition  Body Fat %: 44.8 % Fat Mass (lbs): 100 lbs Muscle Mass (lbs): 116.8 lbs Total Body Water (lbs): 80.6 lbs Visceral Fat Rating : 15   Other Clinical Data Fasting: no Labs: no Today's Visit #: 101 Starting Date: 06/13/19     HPI  Chief Complaint: OBESITY  April [name] is here to discuss her progress with her obesity treatment plan. She is on the the Category 2 Plan and states she is following her eating plan approximately 80-85 % of the time. She states she is exercising walking/gym 40 minutes 2 times per week.   Interval History:  Since last office visit she is up 1 lb. Bio impedence scale reviewed with the patient:  Up 2 lbs muscle mass Down 1 lbs adipose mass  The patient, with a history of vitamin D deficiency, presents for a follow-up of her obesity treatment plan. She reports adherence to her nutrition plan, maintaining a daily caloric intake of approximately 1200 calories.Calories range from [phone]/ Protein 59-107 grams.   She notes occasional forgetfulness in taking her prescribed Topamax, but believes it aids in her weight loss efforts when taken regularly.  The patient has been engaging in regular physical activity, including walking and strength training with ankle weights. She expresses a desire to lose more weight in preparation for a potential left knee replacement surgery, having already undergone Right knee replacement surgery in the past. She also mentions a previous peak weight of 268 pounds, from  which she has significantly reduced.  The patient's upcoming birthday presents a potential challenge to her diet, but she expresses a commitment to returning to her nutrition plan immediately afterward. She also notes an increase in water intake, which may be contributing to a slight increase in water weight.  Pharmacotherapy: topamax for cravings/emotional eating. No side effects. Does feel it helps when she remembers to take it.   TREATMENT PLAN FOR OBESITY:  Recommended Dietary Goals  Natara is currently in the action stage of change. As such, her goal is to continue weight management plan. She has agreed to the Category 2 Plan and keeping a food journal and adhering to recommended goals of 1200 calories and 85+ grams of protein.  Behavioral Intervention  We discussed the following Behavioral Modification Strategies today: increasing lean protein intake, decreasing simple carbohydrates , increasing vegetables, increasing lower glycemic fruits, increasing water intake, work on tracking and journaling calories using tracking application, decreasing eating out or consumption of processed foods, and making healthy choices when eating convenient foods, emotional eating strategies and understanding the difference between hunger signals and cravings, continue to practice mindfulness when eating, planning for success, and celebration eating strategies.  Additional resources provided today: NA  Recommended Physical Activity Goals  Mintie has been advised to work up to 150 minutes of moderate intensity aerobic activity a week and strengthening exercises 2-3 times per week for cardiovascular health, weight loss maintenance and preservation of muscle mass.   She has agreed to Continue current level of physical activity  Pharmacotherapy We discussed various medication options to help [name] with her weight loss efforts and we both agreed to continue topamax for cravings/emotional eating  behaviors.    Return in about 5 weeks (around 08/11/2023) for labs A1c and vitamin D.. She was informed of the importance of frequent follow up visits to maximize her success with intensive lifestyle modifications for her multiple health conditions.  PHYSICAL EXAM:  Blood pressure 105/71, pulse (!) 58, temperature 98.1 F (36.7 C), height 5' (1.524 m), weight 223 lb (101.2 kg), SpO2 99%. Body mass index is 43.55 kg/m.  General: She is overweight, cooperative, alert, well developed, and in no acute distress. PSYCH: Has normal mood, affect and thought process.   Cardiovascular: HR 50's BP 105/71 Lungs: Normal breathing effort, no conversational dyspnea. Neuro: no focal deficits.   DIAGNOSTIC DATA REVIEWED:  BMET    Component Value Date/Time   NA 138 03/19/2023 0346   NA 136 02/05/2023 1004   K 3.7 03/19/2023 0346   CL 104 03/19/2023 0346   CO2 25 03/19/2023 0346   GLUCOSE 98 03/19/2023 0346   BUN 19 03/19/2023 0346   BUN 19 02/05/2023 1004   CREATININE 0.70 03/19/2023 0346   CREATININE 0.87 03/02/2016 1137   CALCIUM 8.6 (L) 03/19/2023 0346   GFRNONAA >60 03/19/2023 0346   GFRNONAA 76 03/02/2016 1137   GFRAA 111 11/06/2020 0907   GFRAA 88 03/02/2016 1137   Lab Results  Component Value Date   HGBA1C 5.8 (H) 02/05/2023   HGBA1C 5.5 12/01/2012   Lab Results  Component Value Date   INSULIN 9.1 02/05/2023   INSULIN 9.5 06/13/2019   Lab Results  Component Value Date   TSH 0.934 03/18/2023   CBC    Component Value Date/Time   WBC 7.0 03/19/2023 0346   RBC 4.48 03/19/2023 0346   HGB 12.6 03/19/2023 0346   HGB 13.7 02/05/2023 1004   HCT 38.7 03/19/2023 0346   HCT 41.8 02/05/2023 1004   PLT 306 03/19/2023 0346   PLT 341 02/05/2023 1004   MCV 86.4 03/19/2023 0346   MCV 86 02/05/2023 1004   MCH 28.1 03/19/2023 0346   MCHC 32.6 03/19/2023 0346   RDW 15.6 (H) 03/19/2023 0346   RDW 14.1 02/05/2023 1004   Iron Studies    Component Value Date/Time   IRON 36 (L)  12/11/2009 1848   TIBC 318 12/11/2009 1848   FERRITIN 19 12/11/2009 1848   IRONPCTSAT 11 (L) 12/11/2009 1848   Lipid Panel     Component Value Date/Time   CHOL 200 (H) 02/05/2023 1004   TRIG 68 02/05/2023 1004   HDL 58 02/05/2023 1004   CHOLHDL 3.8 03/13/2022 1548   VLDL 17 03/13/2022 1548   LDLCALC 130 (H) 02/05/2023 1004   LDLDIRECT 153 (H) 08/16/2020 1007   LDLDIRECT 108 (H) 12/01/2012 1506   Hepatic Function Panel     Component Value Date/Time   PROT 7.2 03/19/2023 0346   PROT 7.7 02/05/2023 1004   ALBUMIN 3.4 (L) 03/19/2023 0346   ALBUMIN 4.4 02/05/2023 1004   AST 16 03/19/2023 0346   ALT 12 03/19/2023 0346   ALKPHOS 96 03/19/2023 0346   BILITOT 0.4 03/19/2023 0346   BILITOT 0.3 02/05/2023 1004      Component Value Date/Time   TSH 0.934 03/18/2023 1900   TSH 1.410 02/05/2023 1004   Nutritional Lab Results  Component Value Date   VD25OH 60.0 02/05/2023   VD25OH 79.9 11/06/2021   VD25OH 65.2 04/07/2021  ASSOCIATED CONDITIONS ADDRESSED TODAY  ASSESSMENT AND PLAN  Problem List Items Addressed This Visit     Vitamin D deficiency   Relevant Medications   Vitamin D, Ergocalciferol, (DRISDOL) 1.25 MG (50000 UNIT) CAPS capsule   Polyphagia - Primary   Osteoarthritis of left knee   Obesity (HCC)- Start BMI 50.0   BMI 40.0-44.9, adult (HCC) Current BMI 43.4   Obesity Patient has been adhering to a 1200 calorie diet with some variability. She has been taking Topamax inconsistently. Despite minimal weight loss, there has been an increase in muscle mass and decrease in adipose tissue, as well as a decrease in visceral adipose rating. -Continue current diet and Topamax regimen. -Encourage consistent use of Topamax. -Consider adding strength training to exercise routine to increase muscle mass.  Polyphagia Currently this is moderately controlled. Medication(s): Topiramate 50 mg at bedtime nightly .  Reported side effects: None  Plan: Continue Topiramate 50  mg nightly \\No  refill needed today.  Continue to work on emotional eating strategies.   Vitamin D deficiency Patient is due for a refill of her Vitamin D supplement. Ergocalciferol 50,000 units every 14 days. No N/V or muscle weakness with Ergocalciferol.  -Refill /Continue Ergocalciferol 50,000 units every 14 days.  -Order labs to check Vitamin D and A1C levels on next visit (08/11/2023). Low vitamin D levels can be associated with adiposity and may result in leptin resistance and weight gain. Also associated with fatigue. Currently on vitamin D supplementation without any adverse effects.   Left knee arthritis:  Patient is considering another knee replacement surgery and is working on weight loss to decrease BMI to less than 40 to qualify for the surgery. -Continue weight loss efforts to decrease BMI.  Follow-up Next appointment scheduled for 08/11/2023 at 9:45am. Labs to be drawn include A1C and Vitamin D levels. ATTESTASTION STATEMENTS:  Reviewed by clinician on day of visit: allergies, medications, problem list, medical history, surgical history, family history, social history, and previous encounter notes.   I have personally spent 30 minutes total time today in preparation, patient care, nutritional counseling and documentation for this visit, including the following: review of clinical lab tests; review of medical tests/procedures/services.      Aser Nylund, PA-C

## 2023-07-08 DIAGNOSIS — F22 Delusional disorders: Secondary | ICD-10-CM | POA: Diagnosis not present

## 2023-07-11 DIAGNOSIS — H5213 Myopia, bilateral: Secondary | ICD-10-CM | POA: Diagnosis not present

## 2023-07-13 ENCOUNTER — Other Ambulatory Visit: Payer: Medicaid Other | Admitting: Obstetrics and Gynecology

## 2023-07-13 NOTE — Patient Outreach (Signed)
Medicaid Managed Care   Unsuccessful Attempt Note   07/13/2023 Name: April [name] MRN: [id number] DOB: 11-07-1960  Referred by: Tiffany Kocher, DO Reason for referral : High Risk Managed Medicaid (Unsuccessful telephone outreach)  An unsuccessful telephone outreach was attempted today. The patient was referred to the case management team for assistance with care management and care coordination.    Follow Up Plan: The Managed Medicaid care management team will reach out to the patient again over the next 30 business  days. and The  Patient has been provided with contact information for the Managed Medicaid care management team and has been advised to call with any health related questions or concerns.   Kathi Der RN, BSN [place]  [employer], production - Managed Medicaid High Risk [phone]

## 2023-07-13 NOTE — Patient Instructions (Signed)
Hi Ms. April [name], I am sorry I missed you today, I hope you are doing okay - as a part of your Medicaid benefit, you are eligible for care management and care coordination services at no cost or copay. I was unable to reach you by phone today but would be happy to help you with your health related needs. Please feel free to call me at [phone].  A member of the Managed Medicaid care management team will reach out to you again over the next 30 business  days.   Kathi Der RN, BSN [place]  [employer] - Managed Medicaid High Risk [phone]

## 2023-07-14 NOTE — Telephone Encounter (Signed)
Patient called and needed to r/s her SS appt.   She is r/s for 08/30/2023 at 8 pm.

## 2023-07-22 DIAGNOSIS — F22 Delusional disorders: Secondary | ICD-10-CM | POA: Diagnosis not present

## 2023-07-23 ENCOUNTER — Encounter (HOSPITAL_COMMUNITY): Payer: Medicaid Other | Admitting: Student in an Organized Health Care Education/Training Program

## 2023-08-05 ENCOUNTER — Other Ambulatory Visit: Payer: Self-pay | Admitting: Obstetrics and Gynecology

## 2023-08-05 DIAGNOSIS — F22 Delusional disorders: Secondary | ICD-10-CM | POA: Diagnosis not present

## 2023-08-05 NOTE — Patient Outreach (Signed)
Medicaid Managed Care   Nurse Care Manager Note  08/05/2023 Name:  April [name] MRN:  [id number] DOB:  04-11-1961  April [name] is an 62 y.o. year old female who is a primary patient of Tiffany Kocher, [place].  [facility] Managed Care Coordination team was consulted for assistance with:    Chronic healthcare management needs, HTN, GERD, osteoarthritis, anxiety/depression, HLD, obesity, Vit D deficiency, delusional disorder, PTSD, PA  [name] was given information about Medicaid Managed Care Coordination team services today. [name] Patient agreed to services and verbal consent obtained.  Engaged with patient by telephone for follow up visit in response to provider referral for case management and/or care coordination services.   Assessments/Interventions:  Review of past medical history, allergies, medications, health status, including review of consultants reports, laboratory and other test data, was performed as part of comprehensive evaluation and provision of chronic care management services.  SDOH (Social Determinants of Health) assessments and interventions performed: SDOH Interventions    Flowsheet Row Patient Outreach Telephone from 08/05/2023 in [place] POPULATION HEALTH DEPARTMENT Patient Outreach Telephone from 04/12/2023 in [place] POPULATION HEALTH DEPARTMENT ED to Hosp-Admission (Discharged) from 03/18/2023 in [facility] Patient Outreach Telephone from 01/11/2023 in [place] POPULATION HEALTH DEPARTMENT Patient Outreach Telephone from 11/13/2022 in [place] POPULATION HEALTH DEPARTMENT Patient Outreach Telephone from 10/19/2022 in [place] POPULATION HEALTH DEPARTMENT  SDOH Interventions        Food Insecurity Interventions -- -- -- -- Intervention Not Indicated --  Housing Interventions Intervention Not Indicated -- -- -- -- --  Transportation Interventions -- -- -- -- -- Intervention Not Indicated   Utilities Interventions -- -- [id number] Referral, Inpatient TOC Other (Comment)  [patient has resources] -- --  Financial Strain Interventions -- -- -- -- Intervention Not Indicated --  Physical Activity Interventions -- -- -- -- -- Intervention Not Indicated  Stress Interventions -- Provide Counseling  [sees therapist] -- -- -- --  Health Literacy Interventions Intervention Not Indicated -- -- -- -- --     Care Plan Allergies  Allergen Reactions   Percocet [Oxycodone-Acetaminophen] Swelling   Naproxen Palpitations    Medications Reviewed Today     Reviewed by [name], RN (Registered Nurse) on 08/05/23 at 1524  Med List Status: <None>   Medication Order Taking? Sig Documenting Provider Last Dose Status Informant  benztropine (COGENTIN) 1 MG tablet [id number] No Take 1 tablet (1 mg total) by mouth daily. [name], MD Taking Active   cetirizine (ZYRTEC) 10 MG tablet [id number] No Take 1 tablet (10 mg total) by mouth daily.  Patient taking differently: Take 10 mg by mouth every other day.   Carlisle Beers, FNP Taking Active Self  diclofenac Sodium (VOLTAREN) 1 % GEL [id number] No Apply 2 g topically 4 (four) times daily as needed (Apply to affected area for pain). Tiffany Kocher, DO Taking Active   haloperidol (HALDOL) 1 MG tablet [id number] No Take 1 tablet (1 mg total) by mouth at bedtime. [name], MD Taking Active   topiramate (TOPAMAX) 25 MG tablet [id number] No TAKE 2 TABLETS(50 MG) BY MOUTH AT BEDTIME [name], [name], PA-C Taking Active   traMADol (ULTRAM) 50 MG tablet [id number] No Take 1 tablet (50 mg total) by mouth daily. 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/16/2023   History of posttraumatic stress disorder (PTSD) 04/16/2023   Syncope 03/18/2023   Hypokalemia 03/18/2023   Hyperlipidemia, pure 03/03/2023   Obesity (HCC)- Start BMI 50.0 01/06/2023   BMI 40.0-44.9, adult (HCC) Current BMI 43.4 01/06/2023   Vaginal discharge 11/23/2022   Osteoarthritis of left knee 11/02/2022   Left knee pain 08/25/2022   Polyphagia 08/13/2022   Callus 07/23/2022   Benign nevus of plantar aspect of foot 06/29/2022   Delusional disorder (HCC) 03/14/2022   Pap smear for cervical cancer screening 09/16/2021   Tension headache 01/01/2021   Essential hypertension 08/15/2020   Insulin resistance 07/11/2019   Class 3 severe obesity with serious comorbidity and body mass index (BMI) of 50.0 to 59.9 in adult (HCC) 07/11/2019   Vitamin D deficiency 03/15/2019   Vitiligo 03/03/2018   Insomnia 11/05/2017   Sleep concern 03/02/2016   Osteoarthritis of knees, bilateral 09/23/2015   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   Low back pain 01/27/2012   GERD 03/22/2009   Anxiety state 11/14/2008   Eczema 05/18/2008   Depression 03/10/2007   HYPERCHOLESTEROLEMIA 12/09/2006   Anemia 12/09/2006   Conditions to be addressed/monitored per PCP order:  Chronic healthcare management needs, HTN, GERD, osteoarthritis, anxiety/depression, HLD, obesity, Vit D deficiency, delusional disorder, PTSD, PA  Care Plan : RN Care Manager Plan of Care  Updates made by [name], RN since 08/05/2023 12:00 AM     Problem: Health Promotion or Disease Self-Management (General Plan of Care)      Long-Range Goal: Chronic Disease Management and Care Coordination Needs/SDOH   Start Date: 05/22/2022  Expected End Date: 11/05/2023  Priority: High  Note:   Current Barriers:  Care Coordination needs related to Limited access to food resources and utility resources-completed. Chronic Disease Management HTN, GERD, osteoarthritis, low back pain, obesity, anxiety/depression,  delusional disorder. 08/05/23:  No complaints today-does not check BP.  Continues to follow with therapist and [facility].  Upcoming sleep study.  Awaiting new glasses.  Denies issues with pain today.    RNCM Clinical Goal(s):  Patient will take all medications exactly as prescribed and will call provider for medication related questions as evidenced by patient report demonstrate understanding of rationale for each prescribed medication as evidenced by patient report attend all scheduled medical appointments as evidenced by patient report continue to work with RN Care Manager to address care management and care coordination needs related to SDOH. HTN, GERD, osteoarthritis, LBP, obesity, anxiety/depression as evidenced by adherence to CM Team Scheduled appointments work with [employer] care guide to address needs related to  Limited access to food and utility resources as evidenced by patient and/or community resource care guide support through collaboration with Medical illustrator, provider, and care team.   Interventions: Inter-disciplinary care team collaboration (see longitudinal plan of care) Evaluation of current treatment plan related to  self management and patient's adherence to plan as established by provider Care Guide referral for utility resources and food resources-completed. Collaborated with Care Guide for food and utility resources  Hypertension Interventions:  (Status:  New goal.) Long Term Goal Last practice recorded BP readings:  BP Readings from Last 3 Encounters:           08/05/23         105/71 01/06/23  122/75 04/08/23         109/74  Most recent eGFR/CrCl:  Lab Results  Component Value Date   EGFR 81 07/14/2022    No components found for: "CRCL"  Evaluation of current treatment plan related to hypertension self management and patient's adherence to plan as established by provider Reviewed medications with patient and discussed  importance of compliance Discussed plans with patient for ongoing care management follow up and provided patient with direct contact information for care management team Advised patient, providing education and rationale, to monitor blood pressure daily and record, calling PCP for findings outside established parameters Reviewed scheduled/upcoming provider appointments  Assessed social determinant of health barriers  SDOH Barriers (Status:  New goal.) Long Term Goal Patient interviewed and SDOH assessment performed        SDOH Interventions    Flowsheet Row Most Recent Value  SDOH Interventions   Food Insecurity Interventions Other (Comment)  [Care Guide referral]  Transportation Interventions Intervention Not Indicated      Patient interviewed and appropriate assessments performed Referred patient to community resources care guide team for assistance with food and utility resources. Discussed plans with patient for ongoing care management follow up and provided patient with direct contact information for care management team  Patient Goals/Self-Care Activities: Take all medications as prescribed Attend all scheduled provider appointments Call pharmacy for medication refills 3-7 days in advance of running out of medications Perform all self care activities independently  Perform IADL's (shopping, preparing meals, housekeeping, managing finances) independently Call provider office for new concerns or questions  Patient will contact [facility] and [employer]  Follow Up Plan:  The patient has been provided with contact information for the care management team and has been advised to call with any health related questions or concerns.  The care management team will reach out to the patient again over the next 90 business  days.   Long-Range Goal: Establish Plan of Care for Chronic Disease Management Needs/SDOH   Priority: High  Note:   Timeframe:  Long-Range Goal Priority:   High Start Date:         05/22/22                    Expected End Date:  ongoing                   Follow Up 10/05/23   - practice safe sex - schedule appointment for flu shot - schedule appointment for vaccines needed due to my age or health - schedule recommended health tests (blood work, mammogram, colonoscopy, pap test) - schedule and keep appointment for annual check-up    Why is this important?   Screening tests can find diseases early when they are easier to treat.  Your doctor or nurse will talk with you about which tests are important for you.  Getting shots for common diseases like the flu and shingles will help prevent them.  08/05/23:  Upcoming [facility] appt-sleep study 11/8.  Recently seen by eye provider-glasses   Follow Up:  Patient agrees to Care Plan and Follow-up.  Plan: The Managed Medicaid care management team will reach out to the patient again over the next 90 business  days. and The  Patient has been provided with contact information for the Managed Medicaid care management team and has been advised to call with any health related questions or concerns.  Date/time of next scheduled RN care management/care coordination outreach:  10/05/23

## 2023-08-05 NOTE — Patient Instructions (Signed)
Hi April [name], thank you for April updates this afternoon, have a nice rest of April day!  April [name] was given information about Medicaid Managed Care team care coordination services as a part of their Healthy [facility] Medicaid benefit. April [name] verbally consented to engagement with April [facility] Managed Care team.   If you are experiencing a medical emergency, please call 911 or report to your local emergency department or urgent care.   If you have a non-emergency medical problem during routine business hours, please contact your provider's office and ask to speak with a nurse.   For questions related to your Healthy [facility] health plan, please call: [phone] or visit April homepage here: [email or web address]  If you would like to schedule transportation through your Healthy [employer] plan, please call April following number at least 2 days in advance of your appointment: [phone]  For information about your ride after you set it up, call Ride Assist at [phone]. Use this number to activate a Will Call pickup, or if your transportation is late for a scheduled pickup. Use this number, too, if you need to make a change or cancel a previously scheduled reservation.  If you need transportation services right away, call [phone]. April after-hours call center is staffed 24 hours to handle ride assistance and urgent reservation requests (including discharges) 365 days a year. Urgent trips include sick visits, hospital discharge requests and life-sustaining treatment.  Call April [facility] Line at [phone], at any time, 24 hours a day, 7 days a week. If you are in danger or need immediate medical attention call 911.  If you would like help to quit smoking, call 1-800-QUIT-NOW (850-336-6305) OR Espaol: 1-855-Djelo-Ya (4-270-623-7628) o para ms informacin haga clic aqu or Text READY to 315-176 to register via text  Ms.  Ruediger - following are April goals we discussed in your visit today:   Goals Addressed    Timeframe:  Long-Range Goal Priority:  High Start Date:         05/22/22                    Expected End Date:  ongoing                   Follow Up 10/05/23   - practice safe sex - schedule appointment for flu shot - schedule appointment for vaccines needed due to my age or health - schedule recommended health tests (blood work, mammogram, colonoscopy, pap test) - schedule and keep appointment for annual check-up    Why is this important?   Screening tests can find diseases early when they are easier to treat.  Your doctor or nurse will talk with you about which tests are important for you.  Getting shots for common diseases like April flu and shingles will help prevent them.  08/05/23:  Upcoming [facility] appt-sleep study 11/8.  Recently seen by eye provider-glasses  [name] verbalizes understanding of instructions and care plan provided today and agrees to view in MyChart. Active MyChart status and [name] understanding of how to access instructions and care plan via MyChart confirmed with [name].     April Managed Medicaid care management team will reach out to April [name] again over April next 90 business  days.  April  [name]  has been provided with contact information for April Managed Medicaid care management team and has been advised to call with any health related questions or concerns.   Refujio Haymer  Kristene Liberati RN, BSN [place]  [facility] Care Management Coordinator - Managed [place] High Risk [phone]   Following is a copy of your plan of care:  Care Plan : RN Care Manager Plan of Care  Updates made by [name], RN since 08/05/2023 12:00 AM     Problem: Health Promotion or Disease Self-Management (General Plan of Care)      Long-Range Goal: Chronic Disease Management and Care Coordination Needs/SDOH   Start Date: 05/22/2022  Expected End Date:  11/05/2023  Priority: High  Note:   Current Barriers:  Care Coordination needs related to Limited access to food resources and utility resources-completed. Chronic Disease Management HTN, GERD, osteoarthritis, low back pain, obesity, anxiety/depression, delusional disorder. 08/05/23:  No complaints today-does not check BP.  Continues to follow with therapist and [facility].  Upcoming sleep study.  Awaiting new glasses.  Denies issues with pain today.    RNCM Clinical Goal(s):  [name] will take all medications exactly as prescribed and will call provider for medication related questions as evidenced by [name] report demonstrate understanding of rationale for each prescribed medication as evidenced by [name] report attend all scheduled medical appointments as evidenced by [name] report continue to work with RN Care Manager to address care management and care coordination needs related to SDOH. HTN, GERD, osteoarthritis, LBP, obesity, anxiety/depression as evidenced by adherence to CM Team Scheduled appointments work with [employer] care guide to address needs related to  Limited access to food and utility resources as evidenced by [name] and/or community resource care guide support through collaboration with Medical illustrator, provider, and care team.   Interventions: Inter-disciplinary care team collaboration (see longitudinal plan of care) Evaluation of current treatment plan related to  self management and [name]'s adherence to plan as established by provider Care Guide referral for utility resources and food resources-completed. Collaborated with Care Guide for food and utility resources  Hypertension Interventions:  (Status:  New goal.) Long Term Goal Last practice recorded BP readings:  BP Readings from Last 3 Encounters:           08/05/23         105/71 01/06/23         122/75 04/08/23         109/74  Most recent eGFR/CrCl:  Lab Results  Component  Value Date   EGFR 81 07/14/2022    No components found for: "CRCL"  Evaluation of current treatment plan related to hypertension self management and [name]'s adherence to plan as established by provider Reviewed medications with [name] and discussed importance of compliance Discussed plans with [name] for ongoing care management follow up and provided [name] with direct contact information for care management team Advised [name], providing education and rationale, to monitor blood pressure daily and record, calling PCP for findings outside established parameters Reviewed scheduled/upcoming provider appointments  Assessed social determinant of health barriers  SDOH Barriers (Status:  New goal.) Long Term Goal [name] interviewed and SDOH assessment performed        SDOH Interventions    Flowsheet Row Most Recent Value  SDOH Interventions   Food Insecurity Interventions Other (Comment)  [Care Guide referral]  Transportation Interventions Intervention Not Indicated      [name] interviewed and appropriate assessments performed Referred [name] to community resources care guide team for assistance with food and utility resources. Discussed plans with [name] for ongoing care management follow up and provided [name] with direct contact information for care management team  [name] Goals/Self-Care Activities: Take all medications as prescribed Attend all scheduled provider appointments Call pharmacy for medication refills 3-7 days in advance of running out of medications Perform all self care activities independently  Perform IADL's (shopping, preparing meals, housekeeping, managing finances) independently Call provider office for new concerns or questions  [name] will contact [employer]  Follow Up Plan:  April [name] has been provided with contact information for April care management team and has been advised to call with any health related questions or concerns.   April care management team will reach out to April [name] again over April next 90 business days

## 2023-08-06 ENCOUNTER — Encounter (HOSPITAL_COMMUNITY): Payer: Self-pay | Admitting: Student in an Organized Health Care Education/Training Program

## 2023-08-06 ENCOUNTER — Ambulatory Visit (INDEPENDENT_AMBULATORY_CARE_PROVIDER_SITE_OTHER): Payer: Medicaid Other | Admitting: Student in an Organized Health Care Education/Training Program

## 2023-08-06 VITALS — BP 125/78 | HR 68 | Wt 224.0 lb

## 2023-08-06 DIAGNOSIS — F19959 Other psychoactive substance use, unspecified with psychoactive substance-induced psychotic disorder, unspecified: Secondary | ICD-10-CM

## 2023-08-06 DIAGNOSIS — F22 Delusional disorders: Secondary | ICD-10-CM

## 2023-08-06 DIAGNOSIS — F1220 Cannabis dependence, uncomplicated: Secondary | ICD-10-CM | POA: Diagnosis not present
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
## 2023-08-09 ENCOUNTER — Telehealth (HOSPITAL_COMMUNITY): Payer: Self-pay | Admitting: Student in an Organized Health Care Education/Training Program

## 2023-08-10 NOTE — Telephone Encounter (Signed)
Spoke with patient about her concerns. 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 Patient reports that she wanted to talk about her initial dx, which was SIPD. Patient endorses that she is aware that when she arrived to her first Behavorial health assessment that she was delusional.  Patient endorses that she only started using THC frequently after the delusional psychosis began. Clarified again for patient that provider is concerned that [facility] is prolonging the delusional and paranoid preoccupations she has been having. Recommend that patient really try to dc THC use to possibly alleviate delusions. Patient endorsed understanding and did agree with Delusional d/o dx and endorsed trying to wrap her mind around the possible co-morbid SIPD.   PGY-4 Eliseo Gum, MD

## 2023-08-11 ENCOUNTER — Other Ambulatory Visit (INDEPENDENT_AMBULATORY_CARE_PROVIDER_SITE_OTHER): Payer: Self-pay | Admitting: Physician Assistant

## 2023-08-11 ENCOUNTER — Encounter (INDEPENDENT_AMBULATORY_CARE_PROVIDER_SITE_OTHER): Payer: Self-pay | Admitting: Physician Assistant

## 2023-08-11 ENCOUNTER — Ambulatory Visit (INDEPENDENT_AMBULATORY_CARE_PROVIDER_SITE_OTHER): Payer: Medicaid Other | Admitting: Physician Assistant

## 2023-08-11 VITALS — BP 104/72 | HR 65 | Temp 97.9°F | Ht 60.0 in | Wt 223.0 lb

## 2023-08-11 DIAGNOSIS — E559 Vitamin D deficiency, unspecified: Secondary | ICD-10-CM | POA: Diagnosis not present

## 2023-08-11 DIAGNOSIS — E785 Hyperlipidemia, unspecified: Secondary | ICD-10-CM

## 2023-08-11 DIAGNOSIS — R7303 Prediabetes: Secondary | ICD-10-CM | POA: Diagnosis not present

## 2023-08-11 DIAGNOSIS — Z6841 Body Mass Index (BMI) 40.0 and over, adult: Secondary | ICD-10-CM | POA: Diagnosis not present

## 2023-08-11 DIAGNOSIS — R632 Polyphagia: Secondary | ICD-10-CM

## 2023-08-11 DIAGNOSIS — E669 Obesity, unspecified: Secondary | ICD-10-CM | POA: Diagnosis not present

## 2023-08-11 DIAGNOSIS — E7849 Other hyperlipidemia: Secondary | ICD-10-CM | POA: Diagnosis not present

## 2023-08-11 MED ORDER — TOPIRAMATE 25 MG PO TABS: 50.0000 mg | ORAL_TABLET | Freq: Every evening | ORAL | 0 refills | Status: DC

## 2023-08-11 NOTE — Progress Notes (Signed)
.[facility]  Office: [phone]  /  Fax: [phone]  WEIGHT SUMMARY AND BIOMETRICS  Vitals Temp: 97.9 F (36.6 C) BP: 104/72 Pulse Rate: 65 SpO2: 99 %   Anthropometric Measurements Height: 5' (1.524 m) Weight: 223 lb (101.2 kg) BMI (Calculated): 43.55 Weight at Last Visit: 223 lb Weight Lost Since Last Visit: 0 Weight Gained Since Last Visit: 0 Starting Weight: 256 lb Total Weight Loss (lbs): 33 lb (15 kg) Peak Weight: 268 lb   Body Composition  Body Fat %: 50.4 % Fat Mass (lbs): 112.6 lbs Muscle Mass (lbs): 105.4 lbs Total Body Water (lbs): 77.4 lbs Visceral Fat Rating : 17   Other Clinical Data Fasting: yes Labs: no Today's Visit #: 66 Starting Date: 06/13/19     HPI  Chief Complaint: OBESITY  April [name] is here to discuss her progress with her obesity treatment plan. She is on the the Category 2 Plan and keeping a food journal and adhering to recommended goals of 1200 calories and 85 grams of  protein and states she is following her eating plan approximately 85 % of the time. She states she is exercising gym/walking and using ankle weights 40-120 minutes 2-4 times per week.   Interval History:  Since last office visit she maintained her weight.   The patient, with a history of prediabetes and vitamin D deficiency, presents for a follow-up of her obesity treatment plan. She reports adherence to a healthy diet and regular exercise regimen, but expresses frustration at a perceived plateau in her weight loss progress. She has been taking Topamax (topiramate) to curb cravings, but occasionally forgets to take it. She is currently on a 25mg  dose, but after discussing with the doctor, she agrees to increase the dose to 50mg  to potentially enhance its effect on her cravings.  The patient also reports improved sleep compared to a crisis three years ago when she struggled with insomnia. She denies any significant plans for Halloween that might disrupt her diet, and shares  that she enjoyed a balanced meal and a small dessert splurge for her recent birthday. She confirms that she quickly returned to her diet regimen the following day.  The patient also mentions that she has been using ankle weights for exercise, which she feels has strengthened her legs without exacerbating her knee pain. She expresses interest in resuming use of her online patient portal, MyChart, but has forgotten her password.  Fasting labs obtained today.  She was informed we would discuss her lab results at her next visit unless there is a critical issue that needs to be addressed sooner. She agreed to keep her next visit at the agreed upon time to discuss these results.   Pharmacotherapy: topamax for emotional eating/cravings. No side effects. Feels it is helpful overall. Sleeping better overall.   TREATMENT PLAN FOR OBESITY: Obesity Patient is maintaining a healthy diet and exercise regimen, but reports a plateau in weight loss. Currently on Topiramate 25mg , but sometimes forgets to take it. -Increase Topiramate to 50mg  nightly to help curb cravings. Recommended Dietary Goals  April [name] is currently in the action stage of change. As such, her goal is to continue weight management plan. She has agreed to the Category 2 Plan and keeping a food journal and adhering to recommended goals of 1200 calories and 85 grams of protein.  Behavioral Intervention  We discussed the following Behavioral Modification Strategies today: continue to work on maintaining a reduced calorie state, getting the recommended amount of protein, incorporating whole foods, making healthy  choices, staying well hydrated and practicing mindfulness when eating..  Additional resources provided today: NA  Recommended Physical Activity Goals  April [name] has been advised to work up to 150 minutes of moderate intensity aerobic activity a week and strengthening exercises 2-3 times per week for cardiovascular health, weight loss  maintenance and preservation of muscle mass.   She has agreed to Think about enjoyable ways to increase daily physical activity and overcoming barriers to exercise and Increase physical activity in their day and reduce sedentary time (increase NEAT).   Pharmacotherapy We discussed various medication options to help April [name] with her weight loss efforts and we both agreed to continue topamax for emotional eating behaviors and continue to work on nutritional and behavioral strategies to promote weight loss.    Return in about 4 weeks (around 09/08/2023).[name] Kitchen She was informed of the importance of frequent follow up visits to maximize her success with intensive lifestyle modifications for her multiple health conditions.  PHYSICAL EXAM:  Blood pressure 104/72, pulse 65, temperature 97.9 F (36.6 C), height 5' (1.524 m), weight 223 lb (101.2 kg), SpO2 99%. Body mass index is 43.55 kg/m.  General: She is overweight, cooperative, alert, well developed, and in no acute distress. PSYCH: Has normal mood, affect and thought process.   Cardiovascular: HR 60's BP 104/72 Lungs: Normal breathing effort, no conversational dyspnea. Neuro: no focal deficits  DIAGNOSTIC DATA REVIEWED:  BMET    Component Value Date/Time   NA 138 03/19/2023 0346   NA 136 02/05/2023 1004   K 3.7 03/19/2023 0346   CL 104 03/19/2023 0346   CO2 25 03/19/2023 0346   GLUCOSE 98 03/19/2023 0346   BUN 19 03/19/2023 0346   BUN 19 02/05/2023 1004   CREATININE 0.70 03/19/2023 0346   CREATININE 0.87 03/02/2016 1137   CALCIUM 8.6 (L) 03/19/2023 0346   GFRNONAA >60 03/19/2023 0346   GFRNONAA 76 03/02/2016 1137   GFRAA 111 11/06/2020 0907   GFRAA 88 03/02/2016 1137   Lab Results  Component Value Date   HGBA1C 5.8 (H) 02/05/2023   HGBA1C 5.5 12/01/2012   Lab Results  Component Value Date   INSULIN 9.1 02/05/2023   INSULIN 9.5 06/13/2019   Lab Results  Component Value Date   TSH 0.934 03/18/2023   CBC    Component  Value Date/Time   WBC 7.0 03/19/2023 0346   RBC 4.48 03/19/2023 0346   HGB 12.6 03/19/2023 0346   HGB 13.7 02/05/2023 1004   HCT 38.7 03/19/2023 0346   HCT 41.8 02/05/2023 1004   PLT 306 03/19/2023 0346   PLT 341 02/05/2023 1004   MCV 86.4 03/19/2023 0346   MCV 86 02/05/2023 1004   MCH 28.1 03/19/2023 0346   MCHC 32.6 03/19/2023 0346   RDW 15.6 (H) 03/19/2023 0346   RDW 14.1 02/05/2023 1004   Iron Studies    Component Value Date/Time   IRON 36 (L) 12/11/2009 1848   TIBC 318 12/11/2009 1848   FERRITIN 19 12/11/2009 1848   IRONPCTSAT 11 (L) 12/11/2009 1848   Lipid Panel     Component Value Date/Time   CHOL 200 (H) 02/05/2023 1004   TRIG 68 02/05/2023 1004   HDL 58 02/05/2023 1004   CHOLHDL 3.8 03/13/2022 1548   VLDL 17 03/13/2022 1548   LDLCALC 130 (H) 02/05/2023 1004   LDLDIRECT 153 (H) 08/16/2020 1007   LDLDIRECT 108 (H) 12/01/2012 1506   Hepatic Function Panel     Component Value Date/Time   PROT 7.2 03/19/2023 0346  PROT 7.7 02/05/2023 1004   ALBUMIN 3.4 (L) 03/19/2023 0346   ALBUMIN 4.4 02/05/2023 1004   AST 16 03/19/2023 0346   ALT 12 03/19/2023 0346   ALKPHOS 96 03/19/2023 0346   BILITOT 0.4 03/19/2023 0346   BILITOT 0.3 02/05/2023 1004      Component Value Date/Time   TSH 0.934 03/18/2023 1900   TSH 1.410 02/05/2023 1004   Nutritional Lab Results  Component Value Date   VD25OH 60.0 02/05/2023   VD25OH 79.9 11/06/2021   VD25OH 65.2 04/07/2021    ASSOCIATED CONDITIONS ADDRESSED TODAY  ASSESSMENT AND PLAN  Problem List Items Addressed This Visit     Vitamin D deficiency - Primary   Relevant Orders   VITAMIN D 25 Hydroxy (Vit-D Deficiency, Fractures)   Polyphagia   Relevant Medications   topiramate (TOPAMAX) 25 MG tablet   Obesity (HCC)- Start BMI 50.0   Hyperlipidemia, pure   Relevant Orders   Lipid Panel With LDL/HDL Ratio   Prediabetes   Relevant Orders   Hemoglobin A1c   Insulin, random   CMP14+EGFR  Obesity Patient is  maintaining a healthy diet and exercise regimen, but reports a plateau in weight loss. Currently on Topiramate 25mg , but sometimes forgets to take it. -Increase Topiramate to 50mg  nightly to help curb cravings.  Prediabetes Last A1c was 5.8- not at goal. Insulin 9.1- nearing goal.   Medication(s): None Polyphagia:No She is working on a nutrition plan to decrease simple carbohydrates, increase lean proteins and exercise to promote weight loss, improve glycemic control and prevent progression to Type 2 diabetes.   Lab Results  Component Value Date   HGBA1C 5.8 (H) 02/05/2023   HGBA1C 5.5 03/13/2022   HGBA1C 5.6 11/06/2021   HGBA1C 5.5 04/29/2021   HGBA1C 7.3 (H) 04/07/2021   Lab Results  Component Value Date   INSULIN 9.1 02/05/2023   INSULIN 7.3 11/06/2021   INSULIN 3.6 04/07/2021   INSULIN 13.8 11/06/2020   INSULIN 11.6 07/10/2020    Plan:  Continue working on nutrition plan to decrease simple carbohydrates, increase lean proteins and exercise to promote weight loss, improve glycemic control and prevent progression to Type 2 diabetes.  Recheck fasting labs today including A1c, Insulin and CMET.   Vitamin D Deficiency Vitamin D is at goal of 50.  Most recent vitamin D level was 60.0. She is on  prescription ergocalciferol 50,000 IU every 14 days. Lab Results  Component Value Date   VD25OH 60.0 02/05/2023   VD25OH 79.9 11/06/2021   VD25OH 65.2 04/07/2021    Plan: Continue and refill  prescription ergocalciferol 50,000 IU every 14 days Low vitamin D levels can be associated with adiposity and may result in leptin resistance and weight gain. Also associated with fatigue. Currently on vitamin D supplementation without any adverse effects.  Recheck vitamin D level today.   Polyphagia Currently this is moderately controlled. Medication(s): Topiramate 25 mg nightly .  Reported side effects: None  Plan: Continue and increase dose /Refill Topiramate 50 mg nightly Monitor  response and titrate accordingly.   Hyperlipidemia LDL is not at goal. HDL at goal -58. Trig 68- at goal.  Medication(s): None Cardiovascular risk factors: dyslipidemia, obesity (BMI >= 30 kg/m2), and smoking/ tobacco exposure  Lab Results  Component Value Date   CHOL 200 (H) 02/05/2023   HDL 58 02/05/2023   LDLCALC 130 (H) 02/05/2023   LDLDIRECT 153 (H) 08/16/2020   TRIG 68 02/05/2023   CHOLHDL 3.8 03/13/2022   CHOLHDL 3.7 11/06/2021  CHOLHDL 3.0 04/07/2021   Lab Results  Component Value Date   ALT 12 03/19/2023   AST 16 03/19/2023   ALKPHOS 96 03/19/2023   BILITOT 0.4 03/19/2023   The 10-year ASCVD risk score (Arnett DK, et al., 2019) is: 3.1%   Values used to calculate the score:     Age: 62 years     Sex: Female     Is Non-Hispanic African American: Yes     Diabetic: No     Tobacco smoker: No     Systolic Blood Pressure: 104 mmHg     Is BP treated: No     HDL Cholesterol: 58 mg/dL     Total Cholesterol: 200 mg/dL  Plan:  No statin therapy currently. LDL consistently elevated. May want to start low dose statin several times weekly to decrease CV risks.  Continue to work on nutrition plan -decreasing simple carbohydrates, increasing lean proteins, decreasing saturated fats and cholesterol , avoiding trans fats and exercise as able to promote weight loss, improve lipids and decrease cardiovascular risks. Recheck fasting lipid panel today.   General Health Maintenance -Order labs including lipids, insulin level, chemistry, and vitamin D. -Follow-up appointment scheduled for 09/08/2023.  ATTESTASTION STATEMENTS:  Reviewed by clinician on day of visit: allergies, medications, problem list, medical history, surgical history, family history, social history, and previous encounter notes.   I have personally spent 34 minutes total time today in preparation, patient care, nutritional counseling and documentation for this visit, including the following: review of clinical  lab tests; review of medical tests/procedures/services.      Rhondalyn Clingan, PA-C

## 2023-08-12 LAB — CMP14+EGFR
ALT: 10 [IU]/L (ref 0–32)
AST: 17 [IU]/L (ref 0–40)
Albumin: 4.2 g/dL (ref 3.9–4.9)
Alkaline Phosphatase: 144 [IU]/L — ABNORMAL HIGH (ref 44–121)
BUN/Creatinine Ratio: 24 (ref 12–28)
BUN: 16 mg/dL (ref 8–27)
Bilirubin Total: 0.4 mg/dL (ref 0.0–1.2)
CO2: 22 mmol/L (ref 20–29)
Calcium: 9.3 mg/dL (ref 8.7–10.3)
Chloride: 104 mmol/L (ref 96–106)
Creatinine, Ser: 0.67 mg/dL (ref 0.57–1.00)
Globulin, Total: 3.5 g/dL (ref 1.5–4.5)
Glucose: 79 mg/dL (ref 70–99)
Potassium: 4.4 mmol/L (ref 3.5–5.2)
Sodium: 141 mmol/L (ref 134–144)
Total Protein: 7.7 g/dL (ref 6.0–8.5)
eGFR: 99 mL/min/{1.73_m2} (ref >59)

## 2023-08-12 LAB — VITAMIN D 25 HYDROXY (VIT D DEFICIENCY, FRACTURES): Vit D, 25-Hydroxy: 86.4 ng/mL (ref 30.0–100.0)

## 2023-08-12 LAB — LIPID PANEL WITH LDL/HDL RATIO
Cholesterol, Total: 221 mg/dL — ABNORMAL HIGH (ref 100–199)
HDL: 60 mg/dL (ref >39)
LDL Chol Calc (NIH): 150 mg/dL — ABNORMAL HIGH (ref 0–99)
LDL/HDL Ratio: 2.5 ratio (ref 0.0–3.2)
Triglycerides: 61 mg/dL (ref 0–149)
VLDL Cholesterol Cal: 11 mg/dL (ref 5–40)

## 2023-08-12 LAB — INSULIN, RANDOM: INSULIN: 6.2 u[IU]/mL (ref 2.6–24.9)

## 2023-08-19 DIAGNOSIS — F22 Delusional disorders: Secondary | ICD-10-CM | POA: Diagnosis not present

## 2023-08-20 ENCOUNTER — Encounter: Payer: Self-pay | Admitting: Internal Medicine

## 2023-08-30 ENCOUNTER — Ambulatory Visit (INDEPENDENT_AMBULATORY_CARE_PROVIDER_SITE_OTHER): Payer: Medicaid Other | Admitting: Neurology

## 2023-08-30 DIAGNOSIS — G4720 Circadian rhythm sleep disorder, unspecified type: Secondary | ICD-10-CM

## 2023-08-30 DIAGNOSIS — G4733 Obstructive sleep apnea (adult) (pediatric): Secondary | ICD-10-CM

## 2023-08-30 DIAGNOSIS — G4700 Insomnia, unspecified: Secondary | ICD-10-CM

## 2023-08-30 DIAGNOSIS — G4719 Other hypersomnia: Secondary | ICD-10-CM

## 2023-08-30 DIAGNOSIS — R0683 Snoring: Secondary | ICD-10-CM

## 2023-08-30 DIAGNOSIS — R351 Nocturia: Secondary | ICD-10-CM

## 2023-08-30 DIAGNOSIS — Z9189 Other specified personal risk factors, not elsewhere classified: Secondary | ICD-10-CM

## 2023-08-31 NOTE — Procedures (Unsigned)
Physician Interpretation:    Referred by: Tiffany Kocher, DO    History and Indication for Testing: 62 year old female with an underlying medical history of anemia, back pain, hypertension, hyperlipidemia, osteoarthritis, remote history of substance use disorder, eczema, anxiety, depression, and severe obesity with a BMI of over 40, who reports snoring and excessive daytime somnolence.  She has trouble maintaining sleep.  Her Epworth sleepiness score is 5 out of 24, fatigue severity score is 46 out of 63.    EEG: Review of the EEG showed no abnormal electrical discharges and symmetrical bihemispheric findings.     EKG: The EKG revealed normal sinus rhythm (NSR). ***   AUDIO/VIDEO REVIEW: The audio and video review did not show any abnormal or unusual behaviors, movements, phonations or vocalizations. The patient took one restroom breaks. No significant or audible snoring was noted.    POST-STUDY QUESTIONNAIRE: Post study, the patient indicated, that sleep was the same as usual.    IMPRESSION:   Dysfunctions associated with sleep stages or arousal from sleep   RECOMMENDATIONS:   This study does not demonstrate any significant obstructive or central sleep disordered breathing with an AHI of less than 5/hour - ***0.2/hour - and oxygen saturations at or above ***91% for the night. Mild ***intermittent snoring was noted. Treatment with a positive airway pressure device, such as CPAP or autoPAP is not *** indicated. ***Weight loss may aid in reducing her snoring.  This study shows sleep fragmentation and abnormal sleep stage percentages; these are nonspecific findings and per se do not signify an intrinsic sleep disorder or a cause for the patient's sleep-related symptoms. Causes include (but are not limited to) the first night effect of the sleep study, circadian rhythm disturbances, medication effect or an underlying mood disorder or medical problem.  The patient should be cautioned not to  drive, work at heights, or operate dangerous or heavy equipment when tired or sleepy. Review and reiteration of good sleep hygiene measures should be pursued with any patient. The patient will be advised to follow up with the referring provider, who will be notified of the test results.      I certify that I have reviewed the entire raw data recording prior to the issuance of this report in accordance with the Standards of Accreditation of the American Academy of Sleep Medicine (AASM).   Huston Foley, MD, PhD Medical Director, [facility]) Diplomat, ABPN (Neurology and Sleep)   Technical Report:   ***

## 2023-09-02 ENCOUNTER — Telehealth: Payer: Self-pay | Admitting: *Deleted

## 2023-09-02 DIAGNOSIS — M1712 Unilateral primary osteoarthritis, left knee: Secondary | ICD-10-CM | POA: Diagnosis not present

## 2023-09-02 NOTE — Telephone Encounter (Signed)
Spoke with patient and discussed sleep study results. Pt verbalized understanding and her questions were answered. She stated that so far she has been trying to lose weight. She is down 3 lbs since last appt. She will also try to avoid sleeping on her back although she reports she spends most nights on her sides. Pt is going to think about other treatment options. I answered her questions about cpap and oral appliance. She will call us back next week with her decision. She thanked me for the call.

## 2023-09-02 NOTE — Telephone Encounter (Signed)
Noted, thank you

## 2023-09-02 NOTE — Telephone Encounter (Signed)
-----   Message from Huston Foley sent at 09/01/2023  5:22 PM EST ----- Please advise patient that her sleep study on 08/30/23 showed very mild OSA. Her AHI was 6/hour, normal is up to 5/hour and O2 remained at or above 91% for the night.  We can consider treatment with an autoPAP machine, if she would like to try. Other treatment options include weight loss and avoiding sleeping on the back or a dental device through dentistry. Let me know how she would like to proceed. I would be happy to make a referral to dentistry for evaluation of an oral appliance.

## 2023-09-08 ENCOUNTER — Ambulatory Visit (INDEPENDENT_AMBULATORY_CARE_PROVIDER_SITE_OTHER): Payer: Medicaid Other | Admitting: Physician Assistant

## 2023-09-13 ENCOUNTER — Encounter (INDEPENDENT_AMBULATORY_CARE_PROVIDER_SITE_OTHER): Payer: Self-pay | Admitting: Physician Assistant

## 2023-09-13 ENCOUNTER — Other Ambulatory Visit (INDEPENDENT_AMBULATORY_CARE_PROVIDER_SITE_OTHER): Payer: Self-pay | Admitting: Physician Assistant

## 2023-09-13 ENCOUNTER — Ambulatory Visit (INDEPENDENT_AMBULATORY_CARE_PROVIDER_SITE_OTHER): Payer: Medicaid Other | Admitting: Physician Assistant

## 2023-09-13 VITALS — BP 111/77 | HR 71 | Temp 98.1°F | Ht 60.0 in | Wt 226.0 lb

## 2023-09-13 DIAGNOSIS — Z6841 Body Mass Index (BMI) 40.0 and over, adult: Secondary | ICD-10-CM

## 2023-09-13 DIAGNOSIS — E559 Vitamin D deficiency, unspecified: Secondary | ICD-10-CM

## 2023-09-13 DIAGNOSIS — R632 Polyphagia: Secondary | ICD-10-CM | POA: Diagnosis not present

## 2023-09-13 DIAGNOSIS — E669 Obesity, unspecified: Secondary | ICD-10-CM | POA: Diagnosis not present

## 2023-09-13 DIAGNOSIS — R7303 Prediabetes: Secondary | ICD-10-CM

## 2023-09-13 DIAGNOSIS — E785 Hyperlipidemia, unspecified: Secondary | ICD-10-CM

## 2023-09-13 MED ORDER — TOPIRAMATE 25 MG PO TABS: 50.0000 mg | ORAL_TABLET | Freq: Every evening | ORAL | 0 refills | Status: DC

## 2023-09-13 NOTE — Progress Notes (Signed)
SUBJECTIVE:  Chief Complaint: Obesity Discussed the use of AI scribe software for clinical note transcription with the patient, who gave verbal consent to proceed.  History of Present Illness     Interim History: She is up 3 lbs since last visit.  She is down 30 lbs overall TBW loss of 11.7% April [name], a 62 year old ifemale with a history of vitamin D deficiency, prediabetes, and polyphagia, presents for a follow-up visit regarding her obesity treatment plan. She has been on Topamax for cravings and ergocalciferol for vitamin D deficiency.  Over the recent holiday period, the patient reports indulging in meals out with family, including a meal at [employer] where she consumed salmon, collard greens, and a baked potato. Despite this, she has maintained a significant weight loss of 30 pounds from her starting weight, with a slight increase of 1.2 pounds over the holiday period. She reports a decrease in her exercise routine over the holiday period but expresses a desire to get back on track.  The patient's diet typically includes [place] sausage, boiled eggs, low-calorie bread, chicken breasts, fish, and shrimp. However, she reports increased hunger and has been consuming snacks such as chips and Cheetos, as well as sweets like pound cake and macaroni and cheese. She has been taking Topamax at night but is unsure if it is helping with her cravings.  In terms of her obesity treatment plan, the patient has been successful in losing weight and building muscle. However, she expresses a desire to continue losing weight and is open to adjusting her treatment plan to achieve this goal. She is particularly interested in increasing her protein intake to manage her hunger.  The patient's recent blood work shows an improvement in her insulin levels, which are now close to the goal. However, her cholesterol levels remain slightly elevated, and she expresses a willingness to make dietary adjustments to  manage this. She is not currently on any cholesterol medication.  April [name] is here to discuss her progress with her obesity treatment plan. She is on the Category 2 Plan and keeping a food journal and adhering to recommended goals of 1200 calories and 85 grams of protein and states she is following her eating plan approximately 80 % of the time. She states she is exercising going to the gym/walking/stretching exercises 30-40 minutes 3-4 times per week.   OBJECTIVE: Visit Diagnoses: Problem List Items Addressed This Visit     Vitamin D deficiency   Polyphagia   Obesity (HCC)- Start BMI 50.0   Prediabetes - Primary  Obesity 62 year old with obesity, currently on a weight loss plan. Lost over 10% of body weight  Increased intake of snacks and carbohydrates during the holidays. Discussed dietary habits and use of Topamax (topiramate) for cravings, with no significant side effects but uncertain efficacy. Plan to trial off Topamax for one week to assess impact on cravings. - Continue weight loss plan with emphasis on protein intake. - Encourage healthy snacks such as 100-calorie protein snacks, homemade popcorn, and low-fat cheeses. - Trial off Topamax for one week. - Follow-up on January 6th at 12:30 PM.  Prediabetes Lab Results  Component Value Date   HGBA1C 5.8 (H) 02/05/2023   HGBA1C 5.5 03/13/2022   HGBA1C 5.6 11/06/2021   Lab Results  Component Value Date   LDLCALC 150 (H) 08/11/2023   CREATININE 0.67 08/11/2023     Prediabetes with recent insulin level of 6.2, close to goal of 5, indicating good control. Encouraged to monitor carbohydrate intake. Continue working  on nutrition plan to decrease simple carbohydrates, increase lean proteins and exercise to promote weight loss, improve glycemic control and prevent progression to Type 2 diabetes.  Recheck A1c and fasting insulin levels in 2-3 months.   Polyphagia Currently this is moderately controlled. Medication(s): Topiramate  50 mg nightly .  Reported side effects: None Discussed dietary habits and use of Topamax (topiramate) for cravings, with no significant side effects but uncertain efficacy. Plan to trial off Topamax for one week to assess impact on cravings. Plan: Continue and refill Topiramate 50 mg nightly.  She is going to try off topiramate for the next week and if does not feel it has improved cravings, she will remain off and not refill. If she feels it has been helpful, she will refill and continue as ordered above.   Vitamin D Deficiency Vitamin D is at goal of 50.  Most recent vitamin D level was 86.4. She is on  prescription ergocalciferol 50,000 IU every 14 days. Lab Results  Component Value Date   VD25OH 86.4 08/11/2023   VD25OH 60.0 02/05/2023   VD25OH 79.9 11/06/2021    Plan: Vitamin D deficiency, currently on ergocalciferol 50,000 units every 14 days. Recent blood work shows significant improvement. Plan to discontinue ergocalciferol after current supply and recheck levels early next year. - Discontinue ergocalciferol after current supply. - Recheck vitamin D levels early next year.   Hyperlipidemia LDL is not at goal. Medication(s): None Cardiovascular risk factors: dyslipidemia, obesity (BMI >= 30 kg/m2), and smoking/ tobacco exposure  Lab Results  Component Value Date   CHOL 221 (H) 08/11/2023   HDL 60 08/11/2023   LDLCALC 150 (H) 08/11/2023   LDLDIRECT 153 (H) 08/16/2020   TRIG 61 08/11/2023   CHOLHDL 3.8 03/13/2022   CHOLHDL 3.7 11/06/2021   CHOLHDL 3.0 04/07/2021   Lab Results  Component Value Date   ALT 10 08/11/2023   AST 17 08/11/2023   ALKPHOS 144 (H) 08/11/2023   BILITOT 0.4 08/11/2023   The 10-year ASCVD risk score (Arnett DK, et al., 2019) is: 4%   Values used to calculate the score:     Age: 55 years     Sex: Female     Is Non-Hispanic African American: Yes     Diabetic: No     Tobacco smoker: No     Systolic Blood Pressure: 111 mmHg     Is BP treated:  No     HDL Cholesterol: 60 mg/dL     Total Cholesterol: 221 mg/dL  Dyslipidemia Elevated LDL cholesterol (150 mg/dL) and good HDL cholesterol (60 mg/dL). Current 10-year cardiovascular risk is 4%. Discussed reducing saturated fat intake and periodic cholesterol monitoring. - Continue dietary modifications to reduce saturated fat intake. - Monitor cholesterol levels periodically. Plan: Continue to work on nutrition plan -decreasing simple carbohydrates, increasing lean proteins, decreasing saturated fats and cholesterol , avoiding trans fats and exercise as able to promote weight loss, improve lipids and decrease cardiovascular risks.     Vitals Temp: 98.1 F (36.7 C) BP: 111/77 Pulse Rate: 71 SpO2: 100 %   Anthropometric Measurements Height: 5' (1.524 m) Weight: 226 lb (102.5 kg) BMI (Calculated): 44.14 Weight at Last Visit: 223 lb Weight Lost Since Last Visit: 0 Weight Gained Since Last Visit: 3 lb Starting Weight: 256 lb Total Weight Loss (lbs): 30 lb (13.6 kg) Peak Weight: 268 lb   Body Composition  Body Fat %: 50.3 % Fat Mass (lbs): 113.8 lbs Muscle Mass (lbs): 106.8 lbs Total Body  Water (lbs): 77.4 lbs Visceral Fat Rating : 18   Other Clinical Data Fasting: no Labs: no Today's Visit #: 65 Starting Date: 06/13/19     ASSESSMENT AND PLAN:  Diet: Sanvika is currently in the action stage of change. As such, her goal is to continue with weight loss efforts. She has agreed to Category 2 Plan.  Exercise: Johnnay has been instructed to work up to a goal of 150 minutes of combined cardio and strengthening exercise per week for weight loss and overall health benefits.   Behavior Modification:  We discussed the following Behavioral Modification Strategies today: increasing lean protein intake, decreasing simple carbohydrates, increasing vegetables, increase H2O intake, increase high fiber foods, no skipping meals, meal planning and cooking strategies, and  holiday eating strategies. We discussed various medication options to help Anaissa with her weight loss efforts and we both agreed to continue to work on nutritional and behavioral strategies to promote weight loss.  .  No follow-ups on file.[name] Kitchen She was informed of the importance of frequent follow up visits to maximize her success with intensive lifestyle modifications for her multiple health conditions.  Attestation Statements:   Reviewed by clinician on day of visit: allergies, medications, problem list, medical history, surgical history, family history, social history, and previous encounter notes.   Time spent on visit including pre-visit chart review and post-visit care and charting was 48 minutes.    Franki Monte ]

## 2023-09-18 DIAGNOSIS — F22 Delusional disorders: Secondary | ICD-10-CM | POA: Diagnosis not present

## 2023-09-23 ENCOUNTER — Other Ambulatory Visit: Payer: Self-pay | Admitting: Family Medicine

## 2023-09-23 DIAGNOSIS — Z1231 Encounter for screening mammogram for malignant neoplasm of breast: Secondary | ICD-10-CM

## 2023-09-28 ENCOUNTER — Ambulatory Visit: Payer: Medicaid Other | Admitting: Student

## 2023-10-01 ENCOUNTER — Encounter (HOSPITAL_COMMUNITY): Payer: Medicaid Other | Admitting: Student in an Organized Health Care Education/Training Program

## 2023-10-01 DIAGNOSIS — F22 Delusional disorders: Secondary | ICD-10-CM | POA: Diagnosis not present

## 2023-10-02 DIAGNOSIS — F22 Delusional disorders: Secondary | ICD-10-CM | POA: Diagnosis not present

## 2023-10-05 ENCOUNTER — Other Ambulatory Visit: Payer: Self-pay | Admitting: Obstetrics and Gynecology

## 2023-10-05 NOTE — Patient Instructions (Signed)
Hi April [name], I am glad you are having a good day-Merry Christmas!  Ms. April [name] was given information about Medicaid Managed Care team care coordination services as a part of their Healthy [facility] Medicaid benefit. [name] verbally consented to engagement with the [facility] Managed Care team.   If you are experiencing a medical emergency, please call 911 or report to your local emergency department or urgent care.   If you have a non-emergency medical problem during routine business hours, please contact your provider's office and ask to speak with a nurse.   For questions related to your Healthy [facility] health plan, please call: [phone] or visit the homepage here: [email or web address]  If you would like to schedule transportation through your Healthy [facility] plan, please call the following number at least 2 days in advance of your appointment: [phone]  For information about your ride after you set it up, call Ride Assist at [phone]. Use this number to activate a Will Call pickup, or if your transportation is late for a scheduled pickup. Use this number, too, if you need to make a change or cancel a previously scheduled reservation.  If you need transportation services right away, call [phone]. The after-hours call center is staffed 24 hours to handle ride assistance and urgent reservation requests (including discharges) 365 days a year. Urgent trips include sick visits, hospital discharge requests and life-sustaining treatment.  Call the [facility] Line at [phone], at any time, 24 hours a day, 7 days a week. If you are in danger or need immediate medical attention call 911.  If you would like help to quit smoking, call 1-800-QUIT-NOW (206-864-8941) OR Espaol: 1-855-Djelo-Ya (1-093-235-5732) o para ms informacin haga clic aqu or Text READY to 202-542 to register via text  Ms. April [name] - following  are the goals we discussed in your visit today:   Goals Addressed    Timeframe:  Long-Range Goal Priority:  High Start Date:         05/22/22                    Expected End Date:  ongoing                   Follow Up 12/06/23   - practice safe sex - schedule appointment for flu shot - schedule appointment for vaccines needed due to my age or health - schedule recommended health tests (blood work, mammogram, colonoscopy, pap test) - schedule and keep appointment for annual check-up    Why is this important?   Screening tests can find diseases early when they are easier to treat.  Your doctor or nurse will talk with you about which tests are important for you.  Getting shots for common diseases like the flu and shingles will help prevent them.  10/05/23:  Upcoming PCP and MMG.  Sleep study completed.  Continues therapy and Healthy Weight and Wellness visits.  Patient verbalizes understanding of instructions and care plan provided today and agrees to view in MyChart. Active MyChart status and patient understanding of how to access instructions and care plan via MyChart confirmed with patient.     The Managed Medicaid care management team will reach out to the patient again over the next 90 business  days.  The  Patient  has been provided with contact information for the Managed Medicaid care management team and has been advised to call with any health related questions or concerns.   [name] Therapist, nutritional  RN, BSN [place]  [facility] Care Management Coordinator - Managed [place] High Risk [phone]   Following is a copy of your plan of care:  Care Plan : RN Care Manager Plan of Care  Updates made by [name], RN since 10/05/2023 12:00 AM     Problem: Health Promotion or Disease Self-Management (General Plan of Care)      Long-Range Goal: Chronic Disease Management and Care Coordination Needs/SDOH   Start Date: 05/22/2022  Expected End Date: 01/03/2024  Priority:  High  Note:   Current Barriers:  Care Coordination needs related to Limited access to food resources and utility resources-completed. Chronic Disease Management HTN, GERD, osteoarthritis, low back pain, obesity, anxiety/depression, delusional disorder. 10/05/23:  No complaints today-has new glasses.  Sleep study completed.  No CPAP for now-continues to work on lifestyle changes.  Continues therapy and has peer support in place.  Does not check BP.  RNCM Clinical Goal(s):  Patient will take all medications exactly as prescribed and will call provider for medication related questions as evidenced by patient report demonstrate understanding of rationale for each prescribed medication as evidenced by patient report attend all scheduled medical appointments as evidenced by patient report continue to work with RN Care Manager to address care management and care coordination needs related to SDOH. HTN, GERD, osteoarthritis, LBP, obesity, anxiety/depression as evidenced by adherence to CM Team Scheduled appointments work with [employer] care guide to address needs related to  Limited access to food and utility resources as evidenced by patient and/or community resource care guide support through collaboration with Medical illustrator, provider, and care team.   Interventions: Inter-disciplinary care team collaboration (see longitudinal plan of care) Evaluation of current treatment plan related to  self management and patient's adherence to plan as established by provider Care Guide referral for utility resources and food resources-completed. Collaborated with Care Guide for food and utility resources  Hypertension Interventions:  (Status:  New goal.) Long Term Goal Last practice recorded BP readings:  BP Readings from Last 3 Encounters:  08/30/23 104/72         04/08/23         109/74  Most recent eGFR/CrCl:  Lab Results  Component Value Date   EGFR 81 07/14/2022    No components found  for: "CRCL"  Evaluation of current treatment plan related to hypertension self management and patient's adherence to plan as established by provider Reviewed medications with patient and discussed importance of compliance Discussed plans with patient for ongoing care management follow up and provided patient with direct contact information for care management team Advised patient, providing education and rationale, to monitor blood pressure daily and record, calling PCP for findings outside established parameters Reviewed scheduled/upcoming provider appointments  Assessed social determinant of health barriers  SDOH Barriers (Status:  New goal.) Long Term Goal Patient interviewed and SDOH assessment performed        SDOH Interventions    Flowsheet Row Most Recent Value  SDOH Interventions   Food Insecurity Interventions Other (Comment)  [Care Guide referral]  Transportation Interventions Intervention Not Indicated      Patient interviewed and appropriate assessments performed Referred patient to community resources care guide team for assistance with food and utility resources. Discussed plans with patient for ongoing care management follow up and provided patient with direct contact information for care management team  Patient Goals/Self-Care Activities: Take all medications as prescribed Attend all scheduled provider appointments Call pharmacy for medication refills 3-7  days in advance of running out of medications Perform all self care activities independently  Perform IADL's (shopping, preparing meals, housekeeping, managing finances) independently Call provider office for new concerns or questions  Patient will contact [employer] and [employer]-completed  Follow Up Plan:  The patient has been provided with contact information for the care management team and has been advised to call with any health related questions or concerns.  The care management team will reach out to  the patient again over the next 90 business  days.

## 2023-10-05 NOTE — Patient Outreach (Signed)
Medicaid Managed Care   Nurse Care Manager Note  10/05/2023 Name:  April [name] MRN:  [id number] DOB:  1960/11/13  April [name] is an 62 y.o. year old female who is a primary patient of Tiffany Kocher, [place].  The [facility] Managed Care Coordination team was consulted for assistance with:    Chronic healthcare management needs, HTN, GERD, osteoarthritis, anxiety/depression/MDD/PTSD/ PA?delusional disorder, HLD, Vit D deficiency  Ms. Hauswirth was given information about Medicaid Managed Care Coordination team services today. [name] Patient agreed to services and verbal consent obtained.  Engaged with patient by telephone for follow up visit in response to provider referral for case management and/or care coordination services.   Patient is participating in a Managed Medicaid Plan:  Yes  Assessments/Interventions:  Review of past medical history, allergies, medications, health status, including review of consultants reports, laboratory and other test data, was performed as part of comprehensive evaluation and provision of chronic care management services.  SDOH (Social Drivers of Health) assessments and interventions performed: SDOH Interventions    Flowsheet Row Patient Outreach Telephone from 10/05/2023 in [place] POPULATION HEALTH DEPARTMENT Patient Outreach Telephone from 08/05/2023 in [place] POPULATION HEALTH DEPARTMENT Patient Outreach Telephone from 04/12/2023 in [place] POPULATION HEALTH DEPARTMENT ED to Hosp-Admission (Discharged) from 03/18/2023 in [place] [facility] Patient Outreach Telephone from 01/11/2023 in [place] POPULATION HEALTH DEPARTMENT Patient Outreach Telephone from 11/13/2022 in [place] POPULATION HEALTH DEPARTMENT  SDOH Interventions        Food Insecurity Interventions -- -- -- -- -- Intervention Not Indicated  Housing Interventions -- Intervention Not Indicated -- -- -- --  Utilities Interventions -- --  -- [id number] Referral, Inpatient TOC Other (Comment)  [patient has resources] --  Alcohol Usage Interventions Intervention Not Indicated (Score <7) -- -- -- -- --  Financial Strain Interventions -- -- -- -- -- Intervention Not Indicated  Stress Interventions -- -- Provide Counseling  [sees therapist] -- -- --  Health Literacy Interventions -- Intervention Not Indicated -- -- -- --     Care Plan Allergies  Allergen Reactions   Percocet [Oxycodone-Acetaminophen] Swelling   Naproxen Palpitations    Medications Reviewed Today     Reviewed by [name], RN (Registered Nurse) on 10/05/23 at 1037  Med List Status: <None>   Medication Order Taking? Sig Documenting Provider Last Dose Status Informant  benztropine (COGENTIN) 1 MG tablet [id number] No Take 1 tablet (1 mg total) by mouth daily. [name], MD Taking Active   cetirizine (ZYRTEC) 10 MG tablet [id number] No Take 1 tablet (10 mg total) by mouth daily.  Patient taking differently: Take 10 mg by mouth every other day.   Carlisle Beers, FNP Taking Active Self  diclofenac Sodium (VOLTAREN) 1 % GEL [id number] No Apply 2 g topically 4 (four) times daily as needed (Apply to affected area for pain). Tiffany Kocher, DO Taking Active   haloperidol (HALDOL) 1 MG tablet [id number] No Take 1 tablet (1 mg total) by mouth at bedtime. [name], MD Taking Active   topiramate (TOPAMAX) 25 MG tablet [id number]  Take 2 tablets (50 mg total) by mouth at bedtime. [name], [name], PA-C  Active   traMADol (ULTRAM) 50 MG tablet [id number] No Take 1 tablet (50 mg total) by mouth daily. Tiffany Kocher, DO Taking Active   Vitamin D, Ergocalciferol, (DRISDOL) 1.25 MG (50000 UNIT) CAPS capsule [id number] No One Capsule by mouth every 14 days [name], Shawn  Ericka Pontiff, PA-C Taking Active            Patient Active Problem List   Diagnosis Date Noted   Prediabetes 04/29/2023   Psychoactive substance-induced psychosis (HCC)  04/16/2023   History of posttraumatic stress disorder (PTSD) 04/16/2023   Syncope 03/18/2023   Hypokalemia 03/18/2023   Hyperlipidemia, pure 03/03/2023   Obesity (HCC)- Start BMI 50.0 01/06/2023   BMI 40.0-44.9, adult (HCC) Current BMI 43.4 01/06/2023   Vaginal discharge 11/23/2022   Osteoarthritis of left knee 11/02/2022   Left knee pain 08/25/2022   Polyphagia 08/13/2022   Callus 07/23/2022   Benign nevus of plantar aspect of foot 06/29/2022   Delusional disorder (HCC) 03/14/2022   Pap smear for cervical cancer screening 09/16/2021   Tension headache 01/01/2021   Essential hypertension 08/15/2020   Insulin resistance 07/11/2019   Class 3 severe obesity with serious comorbidity and body mass index (BMI) of 50.0 to 59.9 in adult (HCC) 07/11/2019   Vitamin D deficiency 03/15/2019   Vitiligo 03/03/2018   Insomnia 11/05/2017   Sleep concern 03/02/2016   Osteoarthritis of knees, bilateral 09/23/2015   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   Low back pain 01/27/2012   GERD 03/22/2009   Anxiety state 11/14/2008   Eczema 05/18/2008   Depression 03/10/2007   HYPERCHOLESTEROLEMIA 12/09/2006   Anemia 12/09/2006   Conditions to be addressed/monitored per PCP order:  Chronic healthcare management needs, HTN, GERD, osteoarthritis, anxiety/depression/MDD/PTSD/ PA?delusional disorder, HLD, Vit D deficiency  Care Plan : RN Care Manager Plan of Care  Updates made by [name], RN since 10/05/2023 12:00 AM     Problem: Health Promotion or Disease Self-Management (General Plan of Care)      Long-Range Goal: Chronic Disease Management and Care Coordination Needs/SDOH   Start Date: 05/22/2022  Expected End Date: 01/03/2024  Priority: High  Note:   Current Barriers:  Care Coordination needs related to Limited access to food resources and utility resources-completed. Chronic Disease Management HTN, GERD, osteoarthritis, low back pain, obesity, anxiety/depression, delusional  disorder. 10/05/23:  No complaints today-has new glasses.  Sleep study completed.  No CPAP for now-continues to work on lifestyle changes.  Continues therapy and has peer support in place.  Does not check BP.  RNCM Clinical Goal(s):  Patient will take all medications exactly as prescribed and will call provider for medication related questions as evidenced by patient report demonstrate understanding of rationale for each prescribed medication as evidenced by patient report attend all scheduled medical appointments as evidenced by patient report continue to work with RN Care Manager to address care management and care coordination needs related to SDOH. HTN, GERD, osteoarthritis, LBP, obesity, anxiety/depression as evidenced by adherence to CM Team Scheduled appointments work with [employer] care guide to address needs related to  Limited access to food and utility resources as evidenced by patient and/or community resource care guide support through collaboration with Medical illustrator, provider, and care team.   Interventions: Inter-disciplinary care team collaboration (see longitudinal plan of care) Evaluation of current treatment plan related to  self management and patient's adherence to plan as established by provider Care Guide referral for utility resources and food resources-completed. Collaborated with Care Guide for food and utility resources  Hypertension Interventions:  (Status:  New goal.) Long Term Goal Last practice recorded BP readings:  BP Readings from Last 3 Encounters:  08/30/23 104/72         04/08/23         109/74  Most  recent eGFR/CrCl:  Lab Results  Component Value Date   EGFR 81 07/14/2022    No components found for: "CRCL"  Evaluation of current treatment plan related to hypertension self management and patient's adherence to plan as established by provider Reviewed medications with patient and discussed importance of compliance Discussed plans with  patient for ongoing care management follow up and provided patient with direct contact information for care management team Advised patient, providing education and rationale, to monitor blood pressure daily and record, calling PCP for findings outside established parameters Reviewed scheduled/upcoming provider appointments  Assessed social determinant of health barriers  SDOH Barriers (Status:  New goal.) Long Term Goal Patient interviewed and SDOH assessment performed        SDOH Interventions    Flowsheet Row Most Recent Value  SDOH Interventions   Food Insecurity Interventions Other (Comment)  [Care Guide referral]  Transportation Interventions Intervention Not Indicated      Patient interviewed and appropriate assessments performed Referred patient to community resources care guide team for assistance with food and utility resources. Discussed plans with patient for ongoing care management follow up and provided patient with direct contact information for care management team  Patient Goals/Self-Care Activities: Take all medications as prescribed Attend all scheduled provider appointments Call pharmacy for medication refills 3-7 days in advance of running out of medications Perform all self care activities independently  Perform IADL's (shopping, preparing meals, housekeeping, managing finances) independently Call provider office for new concerns or questions  Patient will contact [employer] and [employer]-completed  Follow Up Plan:  The patient has been provided with contact information for the care management team and has been advised to call with any health related questions or concerns.  The care management team will reach out to the patient again over the next 90 business  days.   Long-Range Goal: Establish Plan of Care for Chronic Disease Management Needs/SDOH   Priority: High  Note:   Timeframe:  Long-Range Goal Priority:  High Start Date:         05/22/22                     Expected End Date:  ongoing                   Follow Up 12/06/23   - practice safe sex - schedule appointment for flu shot - schedule appointment for vaccines needed due to my age or health - schedule recommended health tests (blood work, mammogram, colonoscopy, pap test) - schedule and keep appointment for annual check-up    Why is this important?   Screening tests can find diseases early when they are easier to treat.  Your doctor or nurse will talk with you about which tests are important for you.  Getting shots for common diseases like the flu and shingles will help prevent them.  10/05/23:  Upcoming PCP and MMG.  Sleep study completed.  Continues therapy and Healthy Weight and Wellness visits.   Follow Up:  Patient agrees to Care Plan and Follow-up.  Plan: The Managed Medicaid care management team will reach out to the patient again over the next 90 business  days. and The  Patient has been provided with contact information for the Managed Medicaid care management team and has been advised to call with any health related questions or concerns.  Date/time of next scheduled RN care management/care coordination outreach:  12/06/23

## 2023-10-08 ENCOUNTER — Encounter: Payer: Self-pay | Admitting: Student

## 2023-10-08 ENCOUNTER — Ambulatory Visit: Payer: Medicaid Other | Admitting: Student

## 2023-10-08 VITALS — BP 135/94 | HR 67 | Ht 60.0 in | Wt 230.4 lb

## 2023-10-08 DIAGNOSIS — K439 Ventral hernia without obstruction or gangrene: Secondary | ICD-10-CM | POA: Diagnosis not present

## 2023-10-08 DIAGNOSIS — K602 Anal fissure, unspecified: Secondary | ICD-10-CM

## 2023-10-08 MED ORDER — NITROGLYCERIN 0.4 % RE OINT: 1.0000 "application " | TOPICAL_OINTMENT | Freq: Two times a day (BID) | RECTAL | 0 refills | Status: DC

## 2023-10-08 MED ORDER — CETIRIZINE HCL 10 MG PO TABS: 10.0000 mg | ORAL_TABLET | Freq: Every day | ORAL | 2 refills | Status: DC

## 2023-10-08 NOTE — Patient Instructions (Addendum)
It was great to see you! Thank you for allowing me to participate in your care!   I recommend that you always bring your medications to each appointment as this makes it easy to ensure we are on the correct medications and helps [place] not miss when refills are needed.  Our plans for today:  - Please use Sitz Baths and take a fiber supplement (I recommend Psyllium over the counter) - Use a pea sized amount of nitroglycerin ointment to part of anus that is painful, twice daily for 3 weeks or until pain resolves.  Take care and seek immediate care sooner if you develop any concerns. Please remember to show up 15 minutes before your scheduled appointment time!  Tiffany Kocher, DO [facility]

## 2023-10-08 NOTE — Progress Notes (Cosign Needed Addendum)
    SUBJECTIVE:   CHIEF COMPLAINT / HPI:   Anal pruritus/pain Patient reports several weeks of pruritus and pain with wiping.  Known internal hemorrhoids.  She occasionally has blood after wiping.  She has used OTC hemorrhoid ointment this past week without relief.  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  She reports intermittent pain, believe this is related to her hernia.  Additionally patient requesting refill of Zyrtec.  OBJECTIVE:   BP (!) 135/94   Pulse 67   Ht 5' (1.524 m)   Wt 230 lb 6.4 oz (104.5 kg)   SpO2 100%   BMI 45.00 kg/m    **Chaperone Alexis CMA present during visual anal exam**  General: NAD, pleasant Cardio: RRR, no MRG. Cap Refill <2s. Respiratory: CTAB, normal wob on RA GI: Abdomen is soft, not tender. BS present.  Small protruding mass in left lower quadrant, reducible, no evidence of incarceration/strangulation. Anal exam: No protruding masses, no visible bleeding, 0.5 cm abrasion at 6 O'clock (posterior) position consistent with anal fissure. Skin: Warm and dry  ASSESSMENT/PLAN:   Assessment & Plan Anal fissure Anal fissure on exam today, likely cause of pruritus and pain. - Counseled on fiber supplementation and sitz bath's - Trial of nitroglycerin ointment, twice daily for 21 days - Follow-up if symptoms improve Spigelian hernia Intermittent pain over spigelian hernia.  Using shared decision making, defer surgical consult at this time given intermittent and mild symptoms with no evidence of incarceration/regulation. - Return precautions discussed - Consider surgery consult if symptoms do not improve  Tiffany Kocher, DO [facility]

## 2023-10-09 DIAGNOSIS — F22 Delusional disorders: Secondary | ICD-10-CM | POA: Diagnosis not present

## 2023-10-14 DIAGNOSIS — F22 Delusional disorders: Secondary | ICD-10-CM | POA: Diagnosis not present

## 2023-10-17 DIAGNOSIS — F22 Delusional disorders: Secondary | ICD-10-CM | POA: Diagnosis not present

## 2023-10-18 ENCOUNTER — Other Ambulatory Visit (INDEPENDENT_AMBULATORY_CARE_PROVIDER_SITE_OTHER): Payer: Self-pay | Admitting: Physician Assistant

## 2023-10-18 ENCOUNTER — Ambulatory Visit (INDEPENDENT_AMBULATORY_CARE_PROVIDER_SITE_OTHER): Payer: Medicaid Other | Admitting: Physician Assistant

## 2023-10-18 ENCOUNTER — Encounter (INDEPENDENT_AMBULATORY_CARE_PROVIDER_SITE_OTHER): Payer: Self-pay | Admitting: Physician Assistant

## 2023-10-18 VITALS — BP 102/64 | HR 79 | Temp 98.8°F | Ht 60.0 in | Wt 231.0 lb

## 2023-10-18 DIAGNOSIS — R7303 Prediabetes: Secondary | ICD-10-CM

## 2023-10-18 DIAGNOSIS — R200 Anesthesia of skin: Secondary | ICD-10-CM | POA: Diagnosis not present

## 2023-10-18 DIAGNOSIS — R632 Polyphagia: Secondary | ICD-10-CM

## 2023-10-18 DIAGNOSIS — E559 Vitamin D deficiency, unspecified: Secondary | ICD-10-CM

## 2023-10-18 DIAGNOSIS — Z6841 Body Mass Index (BMI) 40.0 and over, adult: Secondary | ICD-10-CM

## 2023-10-18 DIAGNOSIS — M25562 Pain in left knee: Secondary | ICD-10-CM

## 2023-10-18 DIAGNOSIS — E669 Obesity, unspecified: Secondary | ICD-10-CM

## 2023-10-18 MED ORDER — VITAMIN D (ERGOCALCIFEROL) 1.25 MG (50000 UNIT) PO CAPS
ORAL_CAPSULE | ORAL | 0 refills | Status: DC
Start: 2023-10-18 — End: 2023-10-18

## 2023-10-18 MED ORDER — TOPIRAMATE 25 MG PO TABS: 50.0000 mg | ORAL_TABLET | Freq: Every evening | ORAL | 0 refills | Status: DC

## 2023-10-18 NOTE — Progress Notes (Signed)
 SUBJECTIVE: Discussed the use of AI scribe software for clinical note transcription with the patient, who gave verbal consent to proceed.  Chief Complaint: Obesity  Interim History: She is up 5 lbs since her last visit.  April [name], a 63 year old patient with a history of prediabetes, polyphagia, vitamin D  deficiency, and hyperlipidemia, presents for a follow-up visit regarding her obesity treatment plan. The patient admits to deviating from her diet plan during the holiday season, indulging in sweets and chocolates. Despite this, she expresses a commitment to return to her diet plan.  The patient reports that she has resumed taking Topamax , which she initially thought was not beneficial. However, she now believes it has been helpful in managing her symptoms. She also continues to take vitamin D  supplements without any reported issues.  The patient engages in volunteer work at [employer] and plans to return to the gym after a brief hiatus during the holidays. She also walks in her neighborhood when knee pain prevents her from going to the gym. She uses a wrap and Voltaren  gel for her knee pain, which she reports as effective.  The patient has been experimenting with new foods as part of her diet plan, including rice cakes, mozzarella sticks, turkey jerky, carrots, and hummus. She also keeps a food journal and estimates her daily caloric intake to be around 1200 calories and 90 grams of protein.  The patient reports occasional numbness in her right foot, which she describes as intermittent and resolving with movement. She expresses concern about this symptom but forgot to mention it during her last visit to her family medicine doctor. The patient had a knee replacement on the same side, but she does not believe the numbness is related to this. She plans to discuss this symptom during her next visit with her primary care provider.  April [name] is here to discuss her progress with her obesity treatment  plan. She is on the Category 2 Plan and states she is following her eating plan approximately 85 % of the time. She states she is exercising walking/[employer] 40 minutes 2 times per week.   OBJECTIVE: Visit Diagnoses: Problem List Items Addressed This Visit     Vitamin D  deficiency   Polyphagia   Obesity (HCC)- Start BMI 50.0   Prediabetes - Primary   Other Visit Diagnoses       BMI 45.0-49.9, adult (HCC) Current BMI 45.11         Obesity Obesity management follow-up. Reports dietary deviations over the holidays but is back on track. Topiramate  (Topamax ) has been helpful in managing cravings. Current regimen includes 1  25 mg tablet at night, with the option to increase to 2 tablets or 50 mg nightly, if cravings persist. Maintaining a 1200 calorie/85+ grams of protein nutrition plan and tracking intake. Discussed zero sugar coffee creamers and April [name]'s prepared meats for convenience. Emphasized maintaining sodium intake below 2400 mg/day. - Continue topiramate  1 tablet at night, may increase to 2 tablets if needed - Maintain 1200 calorie diet - Continue food journaling - Use April [name]'s prepared meats for convenience - Use zero sugar coffee creamers - Maintain sodium intake below 2400 mg/day  Prediabetes Last A1c was 5.8- not at goal / insukin 6.2- nearing goal  Medication(s): Topiramate  25-50 mg nightly Polyphagia:Yes- at times She is working on a nutrition plan to decrease simple carbohydrates, increase lean proteins and exercise to promote weight loss, improve glycemic control and prevent progression to Type 2 diabetes.   Lab Results  Component Value  Date   HGBA1C 5.8 (H) 02/05/2023   HGBA1C 5.5 03/13/2022   HGBA1C 5.6 11/06/2021   HGBA1C 5.5 04/29/2021   HGBA1C 7.3 (H) 04/07/2021   Lab Results  Component Value Date   INSULIN  6.2 08/11/2023   INSULIN  9.1 02/05/2023   INSULIN  7.3 11/06/2021   INSULIN  3.6 04/07/2021   INSULIN  13.8 11/06/2020    Plan: Continue Topiramate  25-50  mg nightly Continue working on nutrition plan to decrease simple carbohydrates, increase lean proteins and exercise to promote weight loss, improve glycemic control and prevent progression to Type 2 diabetes.  Recheck fasting labs next visit.   Polyphagia Currently this is moderately controlled. Medication(s): Topiramate  25-50 mg nightly .  Reported side effects:  Some numbness of right foot but not persistent and isolated to 1 extremity, so doubtful is side effect of topiramate   Plan: Continue and refill Topiramate  25-50 mg nightly Monitor response back on topiramate  consistently.   Knee Pain Intermittent knee pain, managed with Voltaren  gel. Uses an ace wrap for support. Advised to increase Voltaren  application to twice daily, up to four times daily if needed. Discussed that Voltaren  gel is safer than oral NSAIDs as it does not significantly affect liver or kidneys. - Increase Voltaren  gel application to twice daily, up to four times daily if needed  Numbness in Right Foot Intermittent numbness in the right foot, possibly related to previous knee replacement or back issues. No signs of weakness or persistent symptoms. B12 level was normal in April 2024. Plan to recheck B12 and A1c levels. Discussed that numbness could be due to local issues in the foot or ankle, or related to back issues. Can follow up with her PCP or orthopedics.  - Check B12 and A1c levels at next visit - Consider referral to orthopedic doctor if symptoms persist  Vitamin D  Deficiency Vitamin D  is at goal of 50.  Most recent vitamin D  level was 86.4- supra-therapeutic. She is off vitamin D  supplementation for now. Lab Results  Component Value Date   VD25OH 86.4 08/11/2023   VD25OH 60.0 02/05/2023   VD25OH 79.9 11/06/2021    Plan:  Plan to recheck vitamin D  level next visit.    General Health Maintenance Actively engaged in physical activity and dietary management. - Encourage continued physical activity -  Maintain sodium intake below 2400 mg/day  Follow-up - Follow-up appointment on February 10th at 8:30 AM - Perform fasting labs including B12/vitamin D  and A1c levels, CMET and. Fasting lipids and insulin  levels.  Vitals Temp: 98.8 F (37.1 C) BP: 102/64 Pulse Rate: 79 SpO2: 92 %   Anthropometric Measurements Height: 5' (1.524 m) Weight: 231 lb (104.8 kg) BMI (Calculated): 45.11 Starting Weight: 256 lb Peak Weight: 268 lb   No data recorded Other Clinical Data Fasting: No Labs: No Today's Visit #: 105 Starting Date: 06/13/19     ASSESSMENT AND PLAN:  Diet: Clovia is currently in the action stage of change. As such, her goal is to continue with weight loss efforts. She has agreed to Category 2 Plan and keeping a food journal and adhering to recommended goals of 1100-1200 calories and 85 grams of  protein.  Exercise: Chantavia has been instructed to work up to a goal of 150 minutes of combined cardio and strengthening exercise per week for weight loss and overall health benefits.   Behavior Modification:  We discussed the following Behavioral Modification Strategies today: increasing lean protein intake, decreasing simple carbohydrates, increasing vegetables, increase H2O intake, meal planning and cooking  strategies, better snacking choices, emotional eating strategies , planning for success, and keep a strict food journal. We discussed various medication options to help Mayley with her weight loss efforts and we both agreed to continue topiramate  for emotional eating/cravings.  Return in about 4 weeks (around 11/15/2023).SABRA She was informed of the importance of frequent follow up visits to maximize her success with intensive lifestyle modifications for her multiple health conditions.  Attestation Statements:   Reviewed by clinician on day of visit: allergies, medications, problem list, medical history, surgical history, family history, social history, and previous  encounter notes.   Time spent on visit including pre-visit chart review and post-visit care and charting was 48 minutes.    Rayleen Wyrick, PA-C

## 2023-10-22 ENCOUNTER — Ambulatory Visit (INDEPENDENT_AMBULATORY_CARE_PROVIDER_SITE_OTHER): Payer: Medicaid Other | Admitting: Student in an Organized Health Care Education/Training Program

## 2023-10-22 VITALS — BP 113/75 | Wt 230.0 lb

## 2023-10-22 DIAGNOSIS — F22 Delusional disorders: Secondary | ICD-10-CM

## 2023-10-22 DIAGNOSIS — F19959 Other psychoactive substance use, unspecified with psychoactive substance-induced psychotic disorder, unspecified: Secondary | ICD-10-CM

## 2023-10-22 DIAGNOSIS — F1220 Cannabis dependence, uncomplicated: Secondary | ICD-10-CM | POA: Diagnosis not present

## 2023-10-22 MED ORDER — BENZTROPINE MESYLATE 1 MG PO TABS
1.0000 mg | ORAL_TABLET | Freq: Every day | ORAL | 2 refills | Status: DC
Start: 2023-10-22 — End: 2024-01-21

## 2023-10-22 MED ORDER — HALOPERIDOL 1 MG PO TABS
1.0000 mg | ORAL_TABLET | ORAL | 2 refills | Status: DC
Start: 2023-10-22 — End: 2024-01-21
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
## 2023-10-23 DIAGNOSIS — F22 Delusional disorders: Secondary | ICD-10-CM | POA: Diagnosis not present

## 2023-10-27 ENCOUNTER — Other Ambulatory Visit: Payer: Self-pay | Admitting: Student

## 2023-10-28 DIAGNOSIS — F22 Delusional disorders: Secondary | ICD-10-CM | POA: Diagnosis not present

## 2023-10-29 ENCOUNTER — Ambulatory Visit
Admission: RE | Admit: 2023-10-29 | Discharge: 2023-10-29 | Disposition: A | Discharge status: Home / Self Care | Payer: Medicaid Other | Source: Ambulatory Visit | Attending: Family Medicine | Admitting: Family Medicine

## 2023-10-29 DIAGNOSIS — Z1231 Encounter for screening mammogram for malignant neoplasm of breast: Secondary | ICD-10-CM | POA: Diagnosis not present

## 2023-10-30 DIAGNOSIS — F22 Delusional disorders: Secondary | ICD-10-CM | POA: Diagnosis not present

## 2023-11-02 ENCOUNTER — Other Ambulatory Visit: Payer: Self-pay | Admitting: Family Medicine

## 2023-11-02 DIAGNOSIS — R928 Other abnormal and inconclusive findings on diagnostic imaging of breast: Secondary | ICD-10-CM

## 2023-11-06 ENCOUNTER — Ambulatory Visit
Admission: RE | Admit: 2023-11-06 | Discharge: 2023-11-06 | Disposition: A | Discharge status: Home / Self Care | Payer: Medicaid Other | Source: Ambulatory Visit | Attending: Family Medicine | Admitting: Family Medicine

## 2023-11-06 ENCOUNTER — Ambulatory Visit: Payer: Medicaid Other

## 2023-11-06 DIAGNOSIS — R928 Other abnormal and inconclusive findings on diagnostic imaging of breast: Secondary | ICD-10-CM | POA: Diagnosis not present

## 2023-11-06 DIAGNOSIS — F22 Delusional disorders: Secondary | ICD-10-CM | POA: Diagnosis not present

## 2023-11-16 ENCOUNTER — Other Ambulatory Visit: Payer: Medicaid Other

## 2023-11-19 ENCOUNTER — Ambulatory Visit: Payer: Medicaid Other | Admitting: Student

## 2023-11-22 ENCOUNTER — Encounter (INDEPENDENT_AMBULATORY_CARE_PROVIDER_SITE_OTHER): Payer: Self-pay | Admitting: Physician Assistant

## 2023-11-22 ENCOUNTER — Ambulatory Visit (INDEPENDENT_AMBULATORY_CARE_PROVIDER_SITE_OTHER): Payer: Medicaid Other | Admitting: Physician Assistant

## 2023-11-22 ENCOUNTER — Other Ambulatory Visit (INDEPENDENT_AMBULATORY_CARE_PROVIDER_SITE_OTHER): Payer: Self-pay | Admitting: Physician Assistant

## 2023-11-22 VITALS — BP 121/71 | HR 58 | Temp 98.1°F | Ht 60.0 in | Wt 232.0 lb

## 2023-11-22 DIAGNOSIS — E559 Vitamin D deficiency, unspecified: Secondary | ICD-10-CM | POA: Diagnosis not present

## 2023-11-22 DIAGNOSIS — R7303 Prediabetes: Secondary | ICD-10-CM

## 2023-11-22 DIAGNOSIS — Z6841 Body Mass Index (BMI) 40.0 and over, adult: Secondary | ICD-10-CM | POA: Diagnosis not present

## 2023-11-22 DIAGNOSIS — F4321 Adjustment disorder with depressed mood: Secondary | ICD-10-CM

## 2023-11-22 DIAGNOSIS — R632 Polyphagia: Secondary | ICD-10-CM

## 2023-11-22 DIAGNOSIS — E669 Obesity, unspecified: Secondary | ICD-10-CM | POA: Diagnosis not present

## 2023-11-22 MED ORDER — TOPIRAMATE 25 MG PO TABS: 50.0000 mg | ORAL_TABLET | Freq: Every evening | ORAL | 0 refills | Status: DC

## 2023-11-22 NOTE — Progress Notes (Signed)
 [address] [place], [place], [place] [address] Office: [phone]  /  Fax: [phone]   WEIGHT SUMMARY AND BIOMETRICS  Vitals Temp: 98.1 F (36.7 C) BP: 121/71 Pulse Rate: (!) 58 SpO2: 98 %   Anthropometric Measurements Height: 5' (1.524 m) Weight: 232 lb (105.2 kg) BMI (Calculated): 45.31 Weight at Last Visit: 231 lb Weight Lost Since Last Visit: 0 Weight Gained Since Last Visit: 1 lb Starting Weight: 256 lb Total Weight Loss (lbs): 34 lb (15.4 kg) Peak Weight: 268 lb   Body Composition  Body Fat %: 51.3 % Fat Mass (lbs): 119.2 lbs Muscle Mass (lbs): 107.4 lbs Total Body Water  (lbs): 78.8 lbs Visceral Fat Rating : 18     No data recorded Today's Visit #: 70   Starting Date: 06/13/19    Chief Complaint: OBESITY  HPI  Since last office visit she has gained 1 pounds. She reports excellent adherence to reduced calorie nutritional plan. She has been working on increasing protein intake at every meal, eating more fruits, eating more vegetables, drinking more water , avoiding or reducing simple and processed carbohydrates, making healthier choices, continues to exercise, reducing portion sizes, increased physical activity levels, and incorporating more whole foods   Discussed the use of AI scribe software for clinical note transcription with the patient, who gave verbal consent to proceed.  History of Present Illness        April [name] is a 63 year old female who presents for follow-up of her obesity treatment plan.  She has been increasing her physical activity by using public transportation and attending appointments. She has had a slight increase in muscle mass by 1.2 pounds, with no change in adipose tissue and a slight decrease in water  weight.   She continues to take Topamax  (topiramate ) at bedtime, which she believes helps with cravings, although she occasionally consumes snacks like 'Valentine chocolate candy.' Her calorie intake varies between  1,000 and 1,400 calories daily, and she is mindful of her protein intake, Protein intake generally 55-90 grams daily.  She experiences numbness in her foot, which is slightly bothersome. She has not sought additional medical evaluation for this symptom but plans to see her family medicine doctor on Thursday.  She is considering joining a grief support group and is interested in finding a new therapist as her current therapy is concluding. She has not yet discussed this with her current therapist.  Her past medical history includes prediabetes, vitamin D  deficiency, and polyphagia. She is currently fasting and plans to have labs done at her next visit.     Barriers identified: moderate to high levels of stress and limited supports .   Pharmacotherapy for weight loss: She is currently taking Topiramate  (off label use, single agent) with adequate clinical response  and without side effects..    ASSESSMENT AND PLAN  TREATMENT PLAN FOR OBESITY:  Recommended Dietary Goals  April [name] is currently in the action stage of change. As such, her goal is to continue weight management plan. She has agreed to: follow the Category 2 plan - 1200 kcal per day and continue current plan  Behavioral Intervention  We discussed the following Behavioral Modification Strategies today: work on managing stress, creating time for self-care and relaxation and continue to work on maintaining a reduced calorie state, getting the recommended amount of protein, incorporating whole foods, making healthy choices, staying well hydrated and practicing mindfulness when eating..  Additional resources provided today: None and hand out for local grief therapy groups  Recommended Physical Activity Goals  April [name] has been advised to work up to 150 minutes of moderate intensity aerobic activity a week and strengthening exercises 2-3 times per week for cardiovascular health, weight loss maintenance and preservation of muscle  mass.   She has agreed to :  Think about enjoyable ways to increase daily physical activity and overcoming barriers to exercise and Increase physical activity in their day and reduce sedentary time (increase NEAT).  Pharmacotherapy We discussed various medication options to help April [name] with her weight loss efforts and we both agreed to : continue with nutritional and behavioral strategies and continue topiramate  for emotional eating behaviors/cravings  ASSOCIATED CONDITIONS ADDRESSED TODAY  Prediabetes  Polyphagia -     Topiramate ; Take 2 tablets (50 mg total) by mouth at bedtime.  Dispense: 60 tablet; Refill: 0  Vitamin D  deficiency  Grief  Obesity (HCC)- Start BMI 50.0  BMI 45.0-49.9, adult (HCC) Current BMI 45.4    Assessment and Plan         Obesity with polyphagia Maintained weight with no significant gain or loss in adipose tissue but gained 1.2 pounds of muscle mass. Currently on topiramate , which helps with cravings. Limited physical activity due to winter months. Discussed using 'Walking with Gurney Lefort' videos to increase activity. - Continue/refill topiramate   50 mg at bedtime - Encourage increased physical activity, including using 'Walking with Gurney Lefort' videos - Follow up in four weeks for fasting labs  Polyphagia Topiramate  helping with cravings. Occasionally indulges in snacks but mindful of calorie intake. Discussed importance of healthy snacking and maintaining a balanced diet. - Continue/refill topiramate  50 mg  at bedtime - Monitor dietary intake and encourage healthy snacking  Prediabetes A1c and insulin  levels need monitoring to ensure she remains on track. Discussed importance of regular monitoring and maintaining a balanced diet. Lab Results  Component Value Date   HGBA1C 5.8 (H) 02/05/2023   HGBA1C 5.5 03/13/2022   HGBA1C 5.6 11/06/2021   Lab Results  Component Value Date   LDLCALC 150 (H) 08/11/2023   CREATININE 0.67 08/11/2023    Continue  working on nutrition plan to decrease simple carbohydrates, increase lean proteins and exercise to promote weight loss, improve glycemic control and prevent progression to Type 2 diabetes.   - Order fasting labs including A1c and insulin  levels at next visit  Vitamin D  Deficiency She is currently off of vitamin D  supplementation as level was above goal of 50-70  Last vitamin D  Lab Results  Component Value Date   VD25OH 86.4 08/11/2023    Rechecking vitamin D  levels necessary. Discussed importance of maintaining adequate vitamin D  levels for overall health. - Order fasting labs including vitamin D  levels at next visit  Grief and Emotional Support Expressed interest in joining a grief support group. Currently seeing a therapist but considering finding a new one. Discussed benefits of support groups and therapy for emotional well-being. - Provide a list of grief support groups, including online options - Discuss with family doctor about finding a new therapist  General Health Maintenance Due for routine labs including cholesterol, B12, and complete blood count. Prefers to do labs at next visit. Discussed importance of regular health maintenance and monitoring. - Order fasting labs including cholesterol, B12, and complete blood count at next visit  Follow-up - Follow up in four weeks on December 20, 2023, at 9 AM for fasting labs - Refill topiramate  prescription at [facility].      PHYSICAL EXAM:  Blood pressure 121/71, pulse Aaron Aas)  58, temperature 98.1 F (36.7 C), height 5' (1.524 m), weight 232 lb (105.2 kg), SpO2 98%. Body mass index is 45.31 kg/m.  General: She is overweight, cooperative, alert, well developed, and in no acute distress. PSYCH: Has normal mood, affect and thought process.   HEENT: EOMI, sclerae are anicteric. Lungs: Normal breathing effort, no conversational dyspnea. Extremities: No edema.  Neurologic: No gross sensory or motor deficits. No tremors or  fasciculations noted.    DIAGNOSTIC DATA REVIEWED:  BMET    Component Value Date/Time   NA 141 08/11/2023 1111   K 4.4 08/11/2023 1111   CL 104 08/11/2023 1111   CO2 22 08/11/2023 1111   GLUCOSE 79 08/11/2023 1111   GLUCOSE 98 03/19/2023 0346   BUN 16 08/11/2023 1111   CREATININE 0.67 08/11/2023 1111   CREATININE 0.87 03/02/2016 1137   CALCIUM 9.3 08/11/2023 1111   GFRNONAA >60 03/19/2023 0346   GFRNONAA 76 03/02/2016 1137   GFRAA 111 11/06/2020 0907   GFRAA 88 03/02/2016 1137   Lab Results  Component Value Date   HGBA1C 5.8 (H) 02/05/2023   HGBA1C 5.5 03/13/2022   HGBA1C 5.6 11/06/2021   HGBA1C 5.5 04/29/2021   HGBA1C 7.3 (H) 04/07/2021   Lab Results  Component Value Date   INSULIN  6.2 08/11/2023   INSULIN  9.1 02/05/2023   INSULIN  7.3 11/06/2021   INSULIN  3.6 04/07/2021   INSULIN  13.8 11/06/2020   Lab Results  Component Value Date   TSH 0.934 03/18/2023   CBC    Component Value Date/Time   WBC 7.0 03/19/2023 0346   RBC 4.48 03/19/2023 0346   HGB 12.6 03/19/2023 0346   HGB 13.7 02/05/2023 1004   HCT 38.7 03/19/2023 0346   HCT 41.8 02/05/2023 1004   PLT 306 03/19/2023 0346   PLT 341 02/05/2023 1004   MCV 86.4 03/19/2023 0346   MCV 86 02/05/2023 1004   MCH 28.1 03/19/2023 0346   MCHC 32.6 03/19/2023 0346   RDW 15.6 (H) 03/19/2023 0346   RDW 14.1 02/05/2023 1004   Iron Studies    Component Value Date/Time   IRON 36 (L) 12/11/2009 1848   TIBC 318 12/11/2009 1848   FERRITIN 19 12/11/2009 1848   IRONPCTSAT 11 (L) 12/11/2009 1848   Lipid Panel     Component Value Date/Time   CHOL 221 (H) 08/11/2023 1111   TRIG 61 08/11/2023 1111   HDL 60 08/11/2023 1111   CHOLHDL 3.8 03/13/2022 1548   VLDL 17 03/13/2022 1548   LDLCALC 150 (H) 08/11/2023 1111   LDLDIRECT 153 (H) 08/16/2020 1007   LDLDIRECT 108 (H) 12/01/2012 1506   Lab Results  Component Value Date   CHOL 221 (H) 08/11/2023   HDL 60 08/11/2023   LDLCALC 150 (H) 08/11/2023   LDLDIRECT 153  (H) 08/16/2020   TRIG 61 08/11/2023   Hepatic Function Panel     Component Value Date/Time   PROT 7.7 08/11/2023 1111   ALBUMIN 4.2 08/11/2023 1111   AST 17 08/11/2023 1111   ALT 10 08/11/2023 1111   ALKPHOS 144 (H) 08/11/2023 1111   BILITOT 0.4 08/11/2023 1111      Component Value Date/Time   TSH 0.934 03/18/2023 1900   TSH 1.410 02/05/2023 1004   Nutritional Lab Results  Component Value Date   VD25OH 86.4 08/11/2023   VD25OH 60.0 02/05/2023   VD25OH 79.9 11/06/2021     Return in about 4 weeks (around 12/20/2023) for Fasting Lab.Aaron Aas She was informed of the importance of frequent  follow up visits to maximize her success with intensive lifestyle modifications for her multiple health conditions.   ATTESTASTION STATEMENTS:  Reviewed by clinician on day of visit: allergies, medications, problem list, medical history, surgical history, family history, social history, and previous encounter notes.    Rett Stehlik,PA-C

## 2023-11-25 ENCOUNTER — Encounter: Payer: Self-pay | Admitting: Student

## 2023-11-25 ENCOUNTER — Ambulatory Visit: Payer: Medicaid Other | Admitting: Student

## 2023-11-25 VITALS — BP 125/87 | HR 59 | Ht 60.0 in | Wt 234.0 lb

## 2023-11-25 DIAGNOSIS — M1712 Unilateral primary osteoarthritis, left knee: Secondary | ICD-10-CM

## 2023-11-25 DIAGNOSIS — Z1211 Encounter for screening for malignant neoplasm of colon: Secondary | ICD-10-CM | POA: Diagnosis not present

## 2023-11-25 MED ORDER — TRAMADOL HCL 50 MG PO TABS
50.0000 mg | ORAL_TABLET | Freq: Every day | ORAL | 0 refills | Status: DC
Start: 2023-11-25 — End: 2024-06-07

## 2023-11-25 NOTE — Assessment & Plan Note (Signed)
Suspect chronic knee pain related to arthritis, could have component of meniscal tear particularly if patient has arthritis. - Tramadol 50 mg as needed (20 tablet supply) - Follow-up with orthopedic surgery next week

## 2023-11-25 NOTE — Progress Notes (Signed)
    SUBJECTIVE:   CHIEF COMPLAINT / HPI:   Left knee arthritis Patient is followed by orthopedic clinic.  She receives corticosteroid injections.  She takes tramadol 50 mg tablets as needed.  20 pills last her for the month, and this significantly helps improve her quality of life by allowing her to be mobile without pain.  She was previously receiving this prescription through Dr. Robyne Peers in our clinic.  Health maitenance Colonoscopy in 2014, per report repeat in 10 years.  Patient agreeable to repeat colonoscopy.  OBJECTIVE:   BP 125/87   Pulse (!) 59   Ht 5' (1.524 m)   Wt 234 lb (106.1 kg)   SpO2 100%   BMI 45.70 kg/m    General: NAD, pleasant Cardio: RRR, no MRG. Cap Refill <2s. Respiratory: CTAB, normal wob on RA Left knee: No gross swelling, no ecchymosis, small effusion present on exam.  TTP over lateral aspect of knee joint.  Full range of motion.  5/5 strength normal sensation.   ASSESSMENT/PLAN:   Assessment & Plan Primary osteoarthritis of left knee Suspect chronic knee pain related to arthritis, could have component of meniscal tear particularly if patient has arthritis. - Tramadol 50 mg as needed (20 tablet supply) - Follow-up with orthopedic surgery next week Colon cancer screening -Referral to GI for colonoscopy   Tiffany Kocher, DO [facility]

## 2023-11-25 NOTE — Patient Instructions (Signed)
It was great to see you! Thank you for allowing me to participate in your care!   I recommend that you always bring your medications to each appointment as this makes it easy to ensure we are on the correct medications and helps [place] not miss when refills are needed.  Our plans for today:  - I have refilled your Tramadol - See your orhopedic surgeon next week - I have attached some resources for therapy  Take care and seek immediate care sooner if you develop any concerns. Please remember to show up 15 minutes before your scheduled appointment time!  Tiffany Kocher, DO [facility]    Therapy and Counseling Resources Most providers on this list will take Medicaid. Patients with commercial insurance or Medicare should contact their insurance company to get a list of in network providers.  [facility] (spanish speaking therapist available)(habla espanol)(take medicare and medicaid)  [address], [place], [place] [address], [email or web address] [phone]  [facility] [address] [place], [place] [address] [phone]  [facility]   [address] [place], [place] [address]      [phone]  [facility] (spanish available) [address]  [place], [place] [address] [phone]  [facility] (take [facility] and medicare) [address], [address]  [place], [place] [address]       [phone]     [facility] (virtual only) [phone]  Jovita Kussmaul [facility] [address] [place], [place] [phone]  [facility]:  [address] [place] [phone]  [facility]:  [address] [place] [phone]  [facility] (under & uninsured) [address]   [place] [phone]    [email or web address]    [facility] [address]  [place]     [phone]  [facility] -[address]     Phone:  [phone]     [facility]-  [address]  [phone]   [facility] [address]      [place], [place] [phone]  [facility]: [address] [place], [place], [place]  [phone]   [facility] (Spanish therapist) [email or web address]  [address] [place]  [address]   [place] [address]    [phone]    [facility]   [address],  [place], [place]  [phone]   [facility]  [address] [place]  [phone]  [facility]  [address] [place], [place]        [phone]  Open Access/Walk In Clinic under & uninsured  [facility]  [address] [place], [place] [phone] Crisis [phone]  [facility] [place],  (Spanish)   [address], [place]: [phone]) 8:30 - 12; 1 - 2:30  [facility] [employer],  [address] [place], [place]    [phone]):8:30 - 12; 2 - 3PM  [facility] [employer],  [address]  [place]; [phone]):   Mon - Fri 8 AM - 5 PM  [facility] [address] [place]  MWF 12:30 to 3:00 or call to schedule an appointment  [phone]  Specific Provider options Psychology Today  [email or web address] click on find a therapist  enter your zip  code left side and select or tailor a therapist for your specific need.   [facility] Provider Directory [email or web address]  (Medicaid)   Follow all drop down to find a provider  Social Support program [facility] [place] [phone] or [employer]pl [address], [place], [place] Recovery support and educational   24- Hour Availability:   [facility]  [address] [place], [place]  [phone] Crisis [phone]  [facility] [phone]  [place] [facility]  [phone]   [facility]  [phone] (after hours)  Therapeutic Alternative/Mobile Crisis   [phone]  [place] National Suicide Hotline  [phone] Len Childs)  Call 911 or go to emergency room  [facility]  [phone]);  [place] and [employer]  [phone]); [place], [place], [place], [place], [place], [place], [place]

## 2023-12-02 DIAGNOSIS — M1712 Unilateral primary osteoarthritis, left knee: Secondary | ICD-10-CM | POA: Diagnosis not present

## 2023-12-06 ENCOUNTER — Other Ambulatory Visit: Payer: Self-pay | Admitting: Obstetrics and Gynecology

## 2023-12-06 NOTE — Patient Outreach (Signed)
  Medicaid Managed Care   Unsuccessful Attempt Note   12/06/2023 Name: April [name] MRN: [id number] DOB: [birth year]  Referred by: Tiffany Kocher, DO Reason for referral : High Risk Managed Medicaid (Unsuccessful telephone outreach)  An unsuccessful telephone outreach was attempted today. The patient was referred to the case management team for assistance with care management and care coordination.    Follow Up Plan: The Managed Medicaid care management team will reach out to the patient again over the next 30 business  days. and The  Patient has been provided with contact information for the Managed Medicaid care management team and has been advised to call with any health related questions or concerns.   Kathi Der RN, BSN, [facility] RN Care Manager Direct Dial [phone]/WGN [phone] Website: [email or web address]

## 2023-12-06 NOTE — Patient Instructions (Signed)
 Visit Information  Ms. Maanvi R Mincy  - as a part of your Medicaid benefit, you are eligible for care management and care coordination services at no cost or copay. I was unable to reach you by phone today but would be happy to help you with your health related needs. Please feel free to call me at [phone].   A member of the Managed Medicaid care management team will reach out to you again over the next 30 business  days.   Kathi Der RN, BSN, [facility] Health RN Care Manager Direct Dial [phone] [phone] Website: [email or web address]

## 2023-12-15 ENCOUNTER — Other Ambulatory Visit: Payer: Self-pay | Admitting: Obstetrics and Gynecology

## 2023-12-15 NOTE — Patient Outreach (Signed)
  Medicaid Managed Care   Unsuccessful Attempt Note   12/15/2023 Name: April [name] MRN: [id number] DOB: Jun 27, 1961  Referred by: Tiffany Kocher, DO Reason for referral : High Risk Managed Medicaid (Unsuccessful telephone outreach)  A second unsuccessful telephone outreach was attempted today. The patient was referred to the case management team for assistance with care management and care coordination.    Follow Up Plan: The Managed Medicaid care management team will reach out to the patient again over the next 30 business  days. and The  Patient has been provided with contact information for the Managed Medicaid care management team and has been advised to call with any health related questions or concerns.   Kathi Der RN, BSN, [facility] RN Care Manager Direct Dial [phone]/WGN [phone] Website: [email or web address]

## 2023-12-15 NOTE — Patient Instructions (Signed)
 Visit Information  Ms. Arshia R Schoff  - as a part of your Medicaid benefit, you are eligible for care management and care coordination services at no cost or copay. I was unable to reach you by phone today but would be happy to help you with your health related needs. Please feel free to call me at [phone].  A member of the Managed Medicaid care management team will reach out to you again over the next 30 business  days.   Kathi Der RNC, BSN, [employer] [facility] RN Care Manager Direct Dial [phone] Website: [email or web address]

## 2023-12-20 ENCOUNTER — Ambulatory Visit: Admitting: Student

## 2023-12-20 ENCOUNTER — Ambulatory Visit (INDEPENDENT_AMBULATORY_CARE_PROVIDER_SITE_OTHER): Payer: Medicaid Other | Admitting: Physician Assistant

## 2023-12-20 NOTE — Progress Notes (Deleted)

## 2023-12-23 ENCOUNTER — Ambulatory Visit: Admitting: Student

## 2023-12-23 VITALS — BP 137/72 | HR 64 | Ht 60.0 in | Wt 233.8 lb

## 2023-12-23 DIAGNOSIS — I10 Essential (primary) hypertension: Secondary | ICD-10-CM

## 2023-12-23 DIAGNOSIS — R0789 Other chest pain: Secondary | ICD-10-CM

## 2023-12-23 HISTORY — DX: Other chest pain: R07.89

## 2023-12-23 NOTE — Progress Notes (Signed)
    SUBJECTIVE:   CHIEF COMPLAINT / HPI:    History of Present Illness   The patient, with a history of hypertension, hyperlipidemia, and prediabetes, presents for follow-up after a recent episode of chest pain. The chest pain, which lasted for a few seconds, occurred on two consecutive days and prompted the patient to call EMS. The patient denies any further episodes of chest pain since the EMS visit. The patient also has a history of anxiety and takes medication for it. The patient denies any current tobacco use but has a 17-year history of half a pack per day smoking. The patient also reports consumption of energy drinks.      PERTINENT  PMH / PSH:  Cannabis use disorder Delusional disorder Psychoactive substance-induced psychosis  HLD HTN Obesity  OBJECTIVE:   BP 137/72   Pulse 64   Ht 5' (1.524 m)   Wt 233 lb 12.8 oz (106.1 kg)   SpO2 100%   BMI 45.66 kg/m   General: NAD, well appearing Cardiac: RRR, no murmurs, rubs or gallops. No JVD. Neuro: A&O Respiratory: normal WOB on RA. No wheezing or crackles on auscultation, good lung sounds throughout Extremities: Moving all 4 extremities equally, no peripheral edema   ASSESSMENT/PLAN:   Essential hypertension Well-controlled, not requiring medication at this time.  Chest discomfort  2 episodes of chest pain last week. Normal EKG and vital signs. Does have cardiac risk factors including hyperlipidemia, hypertension, prediabetes, obesity. Could have underlying anxiety component, but emphasize importance of not dismissing symptoms that could be indicative of cardiac disease. -Coronary CT calcium score ordered to assess for CAD. -Consider referral to cardiology, stress test if persistent episodes of pain indicative of angina. -Advised to call 911 if chest pain recurs with symptoms like nausea, vomiting or sweating -Encourage daily exercise, stress management, avoidance of caffeine and energy drinks which patient was  drinking today     Darral Dash, DO [facility]

## 2023-12-23 NOTE — Patient Instructions (Addendum)
 It was great seeing you today.  As we discussed, -I ordered a CT calcium score, you will be called to schedule -If you have chest pain again, with or without nausea, vomiting, feeling faint or sweaty then you should call 911 immediately. -It is important to manage stress, avoid caffeine and energy drinks as we discussed -Depending on results of your CT, and your symptoms, we may refer you to a cardiologist -Checking blood work today I will call you if anything is abnormal  If you have any questions or concerns, please feel free to call the clinic.   Have a wonderful day,  Dr. Darral Dash [facility] [phone]

## 2023-12-23 NOTE — Assessment & Plan Note (Signed)
 Well-controlled, not requiring medication at this time.

## 2023-12-23 NOTE — Assessment & Plan Note (Signed)
 2 episodes of chest pain last week. Normal EKG and vital signs. Does have cardiac risk factors including hyperlipidemia, hypertension, prediabetes, obesity. Could have underlying anxiety component, but emphasize importance of not dismissing symptoms that could be indicative of cardiac disease. -Coronary CT calcium score ordered to assess for CAD. -Consider referral to cardiology, stress test if persistent episodes of pain indicative of angina. -Advised to call 911 if chest pain recurs with symptoms like nausea, vomiting or sweating -Encourage daily exercise, stress management, avoidance of caffeine and energy drinks which patient was drinking today

## 2023-12-30 ENCOUNTER — Encounter (INDEPENDENT_AMBULATORY_CARE_PROVIDER_SITE_OTHER): Payer: Self-pay | Admitting: Physician Assistant

## 2023-12-30 ENCOUNTER — Telehealth: Payer: Self-pay

## 2023-12-30 ENCOUNTER — Ambulatory Visit (INDEPENDENT_AMBULATORY_CARE_PROVIDER_SITE_OTHER): Admitting: Physician Assistant

## 2023-12-30 VITALS — BP 112/73 | HR 65 | Temp 97.8°F | Ht 60.0 in | Wt 230.0 lb

## 2023-12-30 DIAGNOSIS — E669 Obesity, unspecified: Secondary | ICD-10-CM | POA: Diagnosis not present

## 2023-12-30 DIAGNOSIS — R632 Polyphagia: Secondary | ICD-10-CM

## 2023-12-30 DIAGNOSIS — R7303 Prediabetes: Secondary | ICD-10-CM

## 2023-12-30 DIAGNOSIS — Z6841 Body Mass Index (BMI) 40.0 and over, adult: Secondary | ICD-10-CM

## 2023-12-30 DIAGNOSIS — R0789 Other chest pain: Secondary | ICD-10-CM

## 2023-12-30 DIAGNOSIS — E559 Vitamin D deficiency, unspecified: Secondary | ICD-10-CM | POA: Diagnosis not present

## 2023-12-30 NOTE — Progress Notes (Addendum)
 SUBJECTIVE: Discussed the use of AI scribe software for clinical note transcription with the patient, who gave verbal consent to proceed.  Chief Complaint: Obesity  Interim History: She is down 2 lbs since last visit. Down 36 lbs overall TBW loss of 14.1%  April [name] is here to discuss her progress with her obesity treatment plan. She is on the Category 2 Plan and keeping a food journal and adhering to recommended goals of 1200-1300 calories and 85 grams of protein and states she is following her eating plan approximately 80 % of the time. She states she is exercising walking and going to Y 40 minutes 2 times per week.  April [name] is a 63 year old female who presents for follow-up of her obesity treatment plan.  She has achieved a total weight loss of thirty-six pounds, with a recent reduction of two pounds since her last visit.   She adheres to a category two nutrition plan and journals intake daily approximately eighty percent of the time and engages in physical activity by walking and attending the [facility], exercising for forty minutes two days per week.   She is currently taking Topamax 25 mg, two tablets daily, which she believes is helping with her cravings. She has reduced her intake of energy drinks, previously consumed in higher quantities, and occasionally consumes small bags of chips and Cheetos, attributing this to stress. She incorporates more filling foods like beans into her diet to help manage hunger, particularly in the evenings.  She experienced a recent episode of chest pain, for which she called paramedics. They checked her blood pressure, diabetes, and heart, but did not transport her. She saw her PCP and there were no concerning findings, but she is awaiting a coronary calcium score CT scan. She has been drinking energy drinks and they discussed some of chest pain may have been anxiety related due to excessive caffeine intake and recommended to decrease/ stop energy  drinks which we also discussed.  Her EKG was noted to be stable.  She is aware of her sodium intake and aims to keep it around 2300 mg per day. OBJECTIVE: Visit Diagnoses: Problem List Items Addressed This Visit     Vitamin D deficiency   Polyphagia   Obesity (HCC)- Start BMI 50.0   Prediabetes   Other Visit Diagnoses       Other chest pain    -  Primary     BMI 45.0-49.9, adult (HCC) Current BMI 45.0          Obesity with polyphagia She has lost 36 pounds overall and 2 pounds since the last visit. She adheres to a Category Two nutrition plan 80% of the time and exercises 40 minutes twice weekly. She takes Teplimax 50 mg daily, aiding in craving control. She has reduced energy drink intake and is improving her diet by eliminating high-saturated fat snacks and incorporating more filling foods like beans. She is taking topamax 50 mg nightly and does feel this is helping to decrease cravings . Reports no refill needed this visit.    She is encouraged to use ankle weights for arm strengthening and explore online exercise resources. - Continue Category Two nutrition plan - Continue Teplimax 50 mg daily - Encourage exercise, including using ankle weights for arm strengthening - Monitor weight and dietary habits - Provide support for stress management   Chest pain Missed her last appointment with [facility] due to recent chest pain led to paramedic intervention and then evaluation by her  PCP. Initial evaluations, including blood pressure and cardiac assessments, were conducted. An EKG was unchanged and without concerning findings in PCP office. A coronary calcium score is recommended but not yet scheduled due to insurance issues. The chest pain may be related to anxiety or energy drink consumption.  - Schedule coronary calcium score - Follow up on lab work - Avoid energy drinks - Consider anxiety management strategies  Prediabetes Last A1c was 5.8- not at goal  Medication(s):  None Polyphagia:No Lab Results  Component Value Date   HGBA1C 5.8 (H) 02/05/2023   HGBA1C 5.5 03/13/2022   HGBA1C 5.6 11/06/2021   HGBA1C 5.5 04/29/2021   HGBA1C 7.3 (H) 04/07/2021   Lab Results  Component Value Date   INSULIN 6.2 08/11/2023   INSULIN 9.1 02/05/2023   INSULIN 7.3 11/06/2021   INSULIN 3.6 04/07/2021   INSULIN 13.8 11/06/2020    Plan:  Continue working on nutrition plan to decrease simple carbohydrates, increase lean proteins and exercise to promote weight loss, improve glycemic control and prevent progression to Type 2 diabetes.   Vitamin D Deficiency Vitamin D is at goal of 50.  Most recent vitamin D level was 86.4- much higher than previous/supra-therapeutic . She is on no vitamin D currently. Lab Results  Component Value Date   VD25OH 86.4 08/11/2023   VD25OH 60.0 02/05/2023   VD25OH 79.9 11/06/2021    Plan:  Recheck vitamin D next visit and likely start OTC vitamin D supplementation at that time.  Low vitamin D levels can be associated with adiposity and may result in leptin resistance and weight gain. Also associated with fatigue.  Currently on vitamin D supplementation without any adverse effects such as nausea, vomiting or muscle weakness.     Vitals Temp: 97.8 F (36.6 C) BP: 112/73 Pulse Rate: 65 SpO2: 96 %   Anthropometric Measurements Height: 5' (1.524 m) Weight: 230 lb (104.3 kg) BMI (Calculated): 44.92 Weight at Last Visit: 232lb Weight Lost Since Last Visit: 2lb Weight Gained Since Last Visit: 0 Starting Weight: 256lb Total Weight Loss (lbs): 36 lb (16.3 kg) Peak Weight: 268lb   Body Composition  Body Fat %: 51.5 % Fat Mass (lbs): 118.6 lbs Muscle Mass (lbs): 106 lbs Total Body Water (lbs): 79.2 lbs Visceral Fat Rating : 18   Other Clinical Data Fasting: no Labs: no Today's Visit #: 65 Starting Date: 06/13/19     ASSESSMENT AND PLAN:  Diet: April [name] is currently in the action stage of change. As such, her  goal is to continue with weight loss efforts. She has agreed to Category 2 Plan and keeping a food journal and adhering to recommended goals of 1200-1300 calories and 85+ grams of protein.  Exercise: April [name] has been instructed to work up to a goal of 150 minutes of combined cardio and strengthening exercise per week for weight loss and overall health benefits.   Behavior Modification:  We discussed the following Behavioral Modification Strategies today: increasing lean protein intake, decreasing simple carbohydrates, increasing vegetables, increase H2O intake, increase high fiber foods, meal planning and cooking strategies, better snacking choices, emotional eating strategies , avoiding temptations, and planning for success. We discussed various medication options to help Kaly with her weight loss efforts and we both agreed to continue topamax for emotional eating/cravings and continue to work on nutritional and behavioral strategies to promote weight loss.  .  Return in about 4 weeks (around 01/27/2024).[name] Kitchen She was informed of the importance of frequent follow up visits to maximize her success  with intensive lifestyle modifications for her multiple health conditions.  Attestation Statements:   Reviewed by clinician on day of visit: allergies, medications, problem list, medical history, surgical history, family history, social history, and previous encounter notes.   Time spent on visit including pre-visit chart review and post-visit care and charting was 26 minutes.    Tylena Prisk, PA-C

## 2023-12-30 NOTE — Telephone Encounter (Signed)
 Patient calls nurse line regarding cost of CT cardiac scoring. She reports that with her insurance cost will be $150. She is unable to afford this. She is asking if there is any other location that she could be sent to that would have a cheaper copayment.   She also reports that she has been having issues with getting her lab work. She was sent across the street to [facility] and was told that they did not have small enough butterfly needles.   Unsure how she is to proceed with labs, patient also has no future labs pending.   Will forward to Dr. Melissa Noon for further advisement.   Melvenia Beam- It looks like this test can only be performed at [facility], are you aware of anywhere else that this could be done at?   Veronda Prude, RN

## 2023-12-31 NOTE — Telephone Encounter (Signed)
 Called patient and advised of update.   Advised her to call back if she has not heard anything from Cardiology within one month.   Veronda Prude, RN

## 2024-01-21 ENCOUNTER — Ambulatory Visit (INDEPENDENT_AMBULATORY_CARE_PROVIDER_SITE_OTHER): Payer: Medicaid Other | Admitting: Student in an Organized Health Care Education/Training Program

## 2024-01-21 VITALS — BP 148/98 | HR 64 | Wt 237.0 lb

## 2024-01-21 DIAGNOSIS — F22 Delusional disorders: Secondary | ICD-10-CM | POA: Diagnosis not present

## 2024-01-21 DIAGNOSIS — F1220 Cannabis dependence, uncomplicated: Secondary | ICD-10-CM | POA: Diagnosis not present

## 2024-01-21 MED ORDER — HYDROXYZINE HCL 25 MG PO TABS: 25.0000 mg | ORAL_TABLET | Freq: Two times a day (BID) | ORAL | 0 refills | Status: DC | PRN

## 2024-01-21 NOTE — Patient Instructions (Signed)
 Medication changes  Discontinue haldol and benztropine.   2. Start Hydroxyzine 25mg  2 times per day as needed for anxiety.
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
## 2024-01-31 ENCOUNTER — Ambulatory Visit (INDEPENDENT_AMBULATORY_CARE_PROVIDER_SITE_OTHER): Admitting: Physician Assistant

## 2024-01-31 ENCOUNTER — Encounter (INDEPENDENT_AMBULATORY_CARE_PROVIDER_SITE_OTHER): Payer: Self-pay | Admitting: Physician Assistant

## 2024-01-31 VITALS — BP 102/71 | HR 59 | Temp 98.2°F | Ht 60.0 in | Wt 229.0 lb

## 2024-01-31 DIAGNOSIS — Z6841 Body Mass Index (BMI) 40.0 and over, adult: Secondary | ICD-10-CM

## 2024-01-31 DIAGNOSIS — R632 Polyphagia: Secondary | ICD-10-CM

## 2024-01-31 DIAGNOSIS — R7303 Prediabetes: Secondary | ICD-10-CM | POA: Diagnosis not present

## 2024-01-31 DIAGNOSIS — E559 Vitamin D deficiency, unspecified: Secondary | ICD-10-CM | POA: Diagnosis not present

## 2024-01-31 DIAGNOSIS — E669 Obesity, unspecified: Secondary | ICD-10-CM

## 2024-01-31 MED ORDER — TOPIRAMATE 25 MG PO TABS: 50.0000 mg | ORAL_TABLET | Freq: Every evening | ORAL | 0 refills | Status: DC

## 2024-01-31 NOTE — Progress Notes (Signed)
 SUBJECTIVE: Discussed the use of AI scribe software for clinical note transcription with the patient, who gave verbal consent to proceed.  Chief Complaint: Obesity  Interim History: She is down 1 lb since last visit.  Down 27 lbs overall  TBW loss of 10.55% April [name] is here to discuss her progress with her obesity treatment plan. She is on the Category 2 Plan and states she is following her eating plan approximately 80 % of the time. She states she is exercising going to the [facility], walking her neighborhood, 30-40 minutes 3-4 times per week.  April [name] is a 63 year old female who presents for follow-up of her obesity treatment plan.   She is currently taking Topiramate , 25 to 50 mg nightly, to help with cravings, although she sometimes forgets to take it. She believes it helps with her cravings and possibly with sleep.  She has a history of prediabetes and vitamin D  deficiency.   She has been trying to cut back on energy drinks, which she believes may contribute to weight gain due to her sugar substitutes like sucralose and aspartame. She is focusing on cleaner eating and drinking habits.  She is actively journaling her food intake and calories, which helps her make better choices, especially when dining out.   She is involved in activities such as volunteering at [facility] and attending grief counseling sessions at a [employer]. She is also planning to attend a dermatology appointment and a cardiology appointment in the near future.  Plans to go to [facility] to have labs repeated prior to her next visit.   OBJECTIVE: Visit Diagnoses: Problem List Items Addressed This Visit     Vitamin D  deficiency   Relevant Orders   Vitamin B12   VITAMIN D  25 Hydroxy (Vit-D Deficiency, Fractures)   Polyphagia   Relevant Medications   topiramate  (TOPAMAX ) 25 MG tablet   Obesity (HCC)- Start BMI 50.0   BMI 40.0-44.9, adult (HCC) Current BMI 43.4   Prediabetes - Primary   Relevant Orders    CMP14+EGFR   Hemoglobin A1c   Insulin , random   Lipid Panel With LDL/HDL Ratio  Obesity with polyphagia.  She is being managed for obesity and is currently on topiramate  25 to 50 mg nightly for cravings, which she sometimes forgets to take. She asked about other options to help with cravings and appetite.   Discussed the option of Wegovy, an injectable medication stronger than topiramate  for appetite suppression, including administration method and potential side effects such as stomach discomfort, nausea, and vomiting. Emphasized the need for long-term commitment for successful use. She decided to continue with topiramate  for now. - Continue topiramate  25 to 50 mg nightly as tolerated. - Consider Wegovy in the future if needed. - Provide a pill organizer to aid medication adherence. Meds ordered this encounter  Medications   topiramate  (TOPAMAX ) 25 MG tablet    Sig: Take 2 tablets (50 mg total) by mouth at bedtime.    Dispense:  60 tablet    Refill:  0    **Patient requests 90 days supply**    Prediabetes She has prediabetes. Emphasized the importance of dietary choices, particularly avoiding artificial sweeteners like sucralose and aspartame, which may lead to cravings. Discussed the impact of these sweeteners on cravings and the importance of clean eating. Lab Results  Component Value Date   HGBA1C 5.8 (H) 02/05/2023   HGBA1C 5.5 03/13/2022   HGBA1C 5.6 11/06/2021   Lab Results  Component Value Date   LDLCALC  150 (H) 08/11/2023   CREATININE 0.67 08/11/2023   INSULIN   Date Value Ref Range Status  08/11/2023 6.2 2.6 - 24.9 uIU/mL Final  ] - Encourage dietary modifications to reduce artificial sweetener intake. - Continue journaling dietary intake to monitor and manage caloric intake. Continue working on nutrition plan to decrease simple carbohydrates, increase lean proteins and exercise to promote weight loss, improve glycemic control and prevent progression to Type 2  diabetes.  Recheck fasting labs at [facility] prior to next visit.   Vitamin D  deficiency She has a history of vitamin D  deficiency. She is not currently on vitamin D  supplementation.  Last vitamin D  Lab Results  Component Value Date   VD25OH 86.4 08/11/2023  Low vitamin D  levels can be associated with adiposity and may result in leptin resistance and weight gain. Also associated with fatigue.  Currently on vitamin D  supplementation without any adverse effects such as nausea, vomiting or muscle weakness.  Plans to monitor vitamin D  levels as part of routine lab work. - Order vitamin D  level as part of lab work.  General Health Maintenance Encouraged to maintain a healthy lifestyle, including adequate hydration and protein intake. Discussed the importance of journaling dietary intake to make better food choices, especially when dining out. Advised to stay active as much as possible, considering her arthritis. - Encourage adequate hydration and protein intake. - Continue journaling dietary intake. - Encourage physical activity as tolerated.  Follow-up She has upcoming appointments with dermatology and a cardiologist. Scheduled a follow-up appointment and discussed lab work to be done before the next visit. - Attend dermatology appointment on May 13. - Attend cardiology appointment with Dr. [place]. - Schedule follow-up appointment on June 4 at 11 AM. - Order lab work including B12, CMAP, A1c, and vitamin D  levels to be done at [facility] before the next visit.  Vitals Temp: 98.2 F (36.8 C) BP: 102/71 Pulse Rate: (!) 59 SpO2: 99 %   Anthropometric Measurements Height: 5' (1.524 m) Weight: 229 lb (103.9 kg) BMI (Calculated): 44.72 Weight at Last Visit: 230 lb Weight Lost Since Last Visit: 1 lb Weight Gained Since Last Visit: 0 Starting Weight: 256 lb Total Weight Loss (lbs): 27 lb (12.2 kg) Peak Weight: 268 lb   Body Composition  Body Fat %: 44.8 % Fat Mass (lbs): 102.8  lbs Muscle Mass (lbs): 120.4 lbs Total Body Water  (lbs): 81.8 lbs Visceral Fat Rating : 16   Other Clinical Data Fasting: yes Labs: no Today's Visit #: 72 Starting Date: 06/13/19     ASSESSMENT AND PLAN:  Diet: Asusena is currently in the action stage of change. As such, her goal is to continue with weight loss efforts. She has agreed to Category 2 Plan.  Exercise: Abraham has been instructed to work up to a goal of 150 minutes of combined cardio and strengthening exercise per week and to continue exercising as is for weight loss and overall health benefits.   Behavior Modification:  We discussed the following Behavioral Modification Strategies today: increasing lean protein intake, decreasing simple carbohydrates, increasing vegetables, increase H2O intake, increase high fiber foods, no skipping meals, avoiding temptations, planning for success, and keep a strict food journal. We discussed various medication options to help Jaren with her weight loss efforts and we both agreed to continue to work on nutritional and behavioral strategies to promote weight loss.  .  Return in about 5 weeks (around 03/06/2024).Aaron Aas She was informed of the importance of frequent follow up visits to maximize  her success with intensive lifestyle modifications for her multiple health conditions.  Attestation Statements:   Reviewed by clinician on day of visit: allergies, medications, problem list, medical history, surgical history, family history, social history, and previous encounter notes.   Time spent on visit including pre-visit chart review and post-visit care and charting was 29 minutes.    Dynasia Kercheval, PA-C

## 2024-02-04 ENCOUNTER — Encounter: Payer: Self-pay | Admitting: Internal Medicine

## 2024-02-10 ENCOUNTER — Telehealth (HOSPITAL_COMMUNITY): Payer: Self-pay

## 2024-02-10 NOTE — Telephone Encounter (Signed)
 Medication problem - Patient called to request this nurse inform Dr. Faylene Hoots she felt she was having some problems with newly prescribed Hydroxyzine . Patient stated she is currently taking this once a day in the evenings but still feels it's effects the following morning. Patient stated the medication was causing her to have some headaches and feelings of being "hung over" each morning so questioned if Dr. Faylene Hoots might want to lower or adjust the medication.  Patient denied any dry mouth, issues with urinating, or feeling dried out with Hydroxyzine  but just some headaches and still tired each morning.  Agreed to send information to Dr. Faylene Hoots with a request for her to call patient back to discuss and patient agreed with plan.

## 2024-02-11 NOTE — Telephone Encounter (Signed)
 Provider called pt.   Pt confirms that she feels a bit hungover in the AM and having headaches after taking a at bedtime dose of hydroxyzine , but endorses she was not having issues the first week. Pt reports that she does feel like it helps with her anxiety. Pt reports that she would prefer to stop the medication, but does feel like she needs something to keep the feeling controlled. Pt endorsed she will stop but if she needs something, she will cut in half at night over the next week and will discuss with provider at f/u next week a potential new medications.   PGY-4 Maddi Collar,MD

## 2024-02-16 DIAGNOSIS — F331 Major depressive disorder, recurrent, moderate: Secondary | ICD-10-CM | POA: Diagnosis not present

## 2024-02-16 DIAGNOSIS — F413 Other mixed anxiety disorders: Secondary | ICD-10-CM | POA: Diagnosis not present

## 2024-02-16 DIAGNOSIS — F4310 Post-traumatic stress disorder, unspecified: Secondary | ICD-10-CM | POA: Diagnosis not present

## 2024-02-18 ENCOUNTER — Ambulatory Visit (INDEPENDENT_AMBULATORY_CARE_PROVIDER_SITE_OTHER): Admitting: Student in an Organized Health Care Education/Training Program

## 2024-02-18 VITALS — BP 128/73 | HR 72 | Wt 238.0 lb

## 2024-02-18 DIAGNOSIS — F22 Delusional disorders: Secondary | ICD-10-CM

## 2024-02-18 DIAGNOSIS — F1220 Cannabis dependence, uncomplicated: Secondary | ICD-10-CM

## 2024-02-18 NOTE — Patient Instructions (Addendum)
 Regimen  Currently: No medications  Historical Medications Haldol  1mg   Cogentin  0.5mg 
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
## 2024-02-22 ENCOUNTER — Encounter: Payer: Self-pay | Admitting: Dermatology

## 2024-02-22 ENCOUNTER — Ambulatory Visit: Payer: Medicaid Other | Admitting: Dermatology

## 2024-02-22 VITALS — BP 110/71

## 2024-02-22 DIAGNOSIS — L811 Chloasma: Secondary | ICD-10-CM

## 2024-02-22 DIAGNOSIS — N898 Other specified noninflammatory disorders of vagina: Secondary | ICD-10-CM | POA: Diagnosis not present

## 2024-02-22 DIAGNOSIS — L80 Vitiligo: Secondary | ICD-10-CM | POA: Diagnosis not present

## 2024-02-22 NOTE — Patient Instructions (Addendum)
 Date: Feb 22, 2024  Hello Jaylenne,  Thank you for visiting today. Here is a summary of the key instructions:  - Medications:   - Apply StrataMGT ointment to vaginal area twice a day, morning and night   - Use Eucerin Radiant Tone Dual serum on cheeks twice a day, morning and night   - Apply Eucerin Radiant Tone Sunscreen on cheeks in the morning   - Skin Care:   - If vitiligo spreads to visible areas like arms, face, or chest, contact the office right away  - Follow-up:   - Return for a follow-up appointment in 4 months  Please reach out if you have any questions or concerns.  Warm regards,  Dr. Louana Roup, Dermatology Important Information  Due to recent changes in healthcare laws, you may see results of your pathology and/or laboratory studies on MyChart before the doctors have had a chance to review them. We understand that in some cases there may be results that are confusing or concerning to you. Please understand that not all results are received at the same time and often the doctors may need to interpret multiple results in order to provide you with the best plan of care or course of treatment. Therefore, we ask that you please give us  2 business days to thoroughly review all your results before contacting the office for clarification. Should we see a critical lab result, you will be contacted sooner.   If You Need Anything After Your Visit  If you have any questions or concerns for your doctor, please call our main line at [phone] If no one answers, please leave a voicemail as directed and we will return your call as soon as possible. Messages left after 4 pm will be answered the following business day.   You may also send us  a message via MyChart. We typically respond to MyChart messages within 1-2 business days.  For prescription refills, please ask your pharmacy to contact our office. Our fax number is [phone].  If you have an urgent issue when the clinic  is closed that cannot wait until the next business day, you can page your doctor at the number below.    Please note that while we do our best to be available for urgent issues outside of office hours, we are not available 24/7.   If you have an urgent issue and are unable to reach us , you may choose to seek medical care at your doctor's office, retail clinic, urgent care center, or emergency room.  If you have a medical emergency, please immediately call 911 or go to the emergency department. In the event of inclement weather, please call our main line at [phone] for an update on the status of any delays or closures.  Dermatology Medication Tips: Please keep the boxes that topical medications come in in order to help keep track of the instructions about where and how to use these. Pharmacies typically print the medication instructions only on the boxes and not directly on the medication tubes.   If your medication is too expensive, please contact our office at [phone] or send us  a message through MyChart.   We are unable to tell what your co-pay for medications will be in advance as this is different depending on your insurance coverage. However, we may be able to find a substitute medication at lower cost or fill out paperwork to get insurance to cover a needed medication.   If a prior authorization is required to  get your medication covered by your insurance company, please allow us  1-2 business days to complete this process.  Drug prices often vary depending on where the prescription is filled and some pharmacies may offer cheaper prices.  The website www.goodrx.com contains coupons for medications through different pharmacies. The prices here do not account for what the cost may be with help from insurance (it may be cheaper with your insurance), but the website can give you the price if you did not use any insurance.  - You can print the associated coupon and take it with your  prescription to the pharmacy.  - You may also stop by our office during regular business hours and pick up a GoodRx coupon card.  - If you need your prescription sent electronically to a different pharmacy, notify our office through [facility] or by phone at [phone]

## 2024-02-22 NOTE — Progress Notes (Signed)
   New Patient Visit   Subjective  April [name] is a 63 y.o. female who presents for the following: New Pt - Vitiligo  Patient states she has vitiligo located at the buttocks & possibly vagina that she would like to have examined. Patient reports the areas have been there for 10 years. She reports the areas are not bothersome. Patient rates irritation 0 out of 10. She states that the areas have not spread. Patient reports she has previously been treated for these areas by PCP and received referral to derm. Patient denied Hx of bx. Patient denied family history of skin cancer(s).   The following portions of the chart were reviewed this encounter and updated as appropriate: medications, allergies, medical history  Review of Systems:  No other skin or systemic complaints except as noted in HPI or Assessment and Plan.  Objective  Well appearing patient in no apparent distress; mood and affect are within normal limits.   A focused examination was performed of the following areas: buttocks & groin   Relevant exam findings are noted in the Assessment and Plan.    Assessment & Plan    1. Vitiligo - Assessment: 10-year history of vitiligo affecting the buttocks and genitalia, specifically the inner cheek, labia majora, and perirectal area. Condition appears stabilized and localized. Depigmented patches not associated with pruritus. Etiology likely related to stress or autoimmune attacks on melanocytes. Frequent micro-traumas may exacerbate the condition. - Plan:    Defer treatment unless new areas of depigmentation appear    Patient to monitor for spread to visible areas (arms, face, chest) and report immediately if noted    Follow up in 4 months to reassess  2. Vaginal dryness - Assessment: Patient reports vaginal dryness, likely secondary to post-menopausal estrogen loss. Symptom is distinct from vitiligo. - Plan:    Prescribe StrataMGT lubricating ointment for mucosal surfaces      - Apply twice daily (morning and night)    Reassess efficacy at 8-month follow-up  3. Melasma - Assessment: Melasma noted on bilateral cheeks, likely due to hormonal changes. Hyperpigmentation condition distinct from vitiligo. - Plan:    Recommend over-the-counter Excedrin Radiant Tone Thiamidol     - Apply twice daily (morning and night)    Recommend Excedrin Radiant Tone Sunscreen     - Apply in the morning after the dual serum    Provide samples of the sunscreen    Reassess efficacy at 33-month follow-up   Return in about 4 months (around 06/24/2024) for vitiligo & melasma.    Documentation: I have reviewed the above documentation for accuracy and completeness, and I agree with the above.  I, Shirron Louanne Roussel, CMA, am acting as scribe for [employer], DO.   Louana Roup, DO

## 2024-02-24 DIAGNOSIS — M1712 Unilateral primary osteoarthritis, left knee: Secondary | ICD-10-CM | POA: Diagnosis not present

## 2024-02-26 LAB — LIPID PANEL WITH LDL/HDL RATIO
Cholesterol, Total: 214 mg/dL — ABNORMAL HIGH (ref 100–199)
HDL: 53 mg/dL (ref >39)
LDL Chol Calc (NIH): 140 mg/dL — ABNORMAL HIGH (ref 0–99)
LDL/HDL Ratio: 2.6 ratio (ref 0.0–3.2)
Triglycerides: 120 mg/dL (ref 0–149)
VLDL Cholesterol Cal: 21 mg/dL (ref 5–40)

## 2024-02-26 LAB — HEMOGLOBIN A1C
Est. average glucose Bld gHb Est-mCnc: 108 mg/dL
Hgb A1c MFr Bld: 5.4 % (ref 4.8–5.6)

## 2024-02-26 LAB — CMP14+EGFR
ALT: 10 IU/L (ref 0–32)
AST: 19 IU/L (ref 0–40)
Albumin: 4.1 g/dL (ref 3.9–4.9)
Alkaline Phosphatase: 143 IU/L — ABNORMAL HIGH (ref 44–121)
BUN/Creatinine Ratio: 33 — ABNORMAL HIGH (ref 12–28)
BUN: 20 mg/dL (ref 8–27)
Bilirubin Total: 0.3 mg/dL (ref 0.0–1.2)
CO2: 19 mmol/L — ABNORMAL LOW (ref 20–29)
Calcium: 9.3 mg/dL (ref 8.7–10.3)
Chloride: 105 mmol/L (ref 96–106)
Creatinine, Ser: 0.61 mg/dL (ref 0.57–1.00)
Globulin, Total: 3.3 g/dL (ref 1.5–4.5)
Glucose: 63 mg/dL — ABNORMAL LOW (ref 70–99)
Potassium: 4.4 mmol/L (ref 3.5–5.2)
Sodium: 140 mmol/L (ref 134–144)
Total Protein: 7.4 g/dL (ref 6.0–8.5)
eGFR: 102 mL/min/{1.73_m2} (ref >59)

## 2024-02-26 LAB — INSULIN, RANDOM: INSULIN: 15.5 u[IU]/mL (ref 2.6–24.9)

## 2024-02-26 LAB — VITAMIN B12: Vitamin B-12: 1970 pg/mL — ABNORMAL HIGH (ref 232–1245)

## 2024-02-26 LAB — VITAMIN D 25 HYDROXY (VIT D DEFICIENCY, FRACTURES): Vit D, 25-Hydroxy: 40.1 ng/mL (ref 30.0–100.0)

## 2024-03-03 ENCOUNTER — Ambulatory Visit (AMBULATORY_SURGERY_CENTER): Admitting: *Deleted

## 2024-03-03 ENCOUNTER — Telehealth: Payer: Self-pay | Admitting: *Deleted

## 2024-03-03 VITALS — Ht 60.0 in | Wt 229.0 lb

## 2024-03-03 DIAGNOSIS — Z1211 Encounter for screening for malignant neoplasm of colon: Secondary | ICD-10-CM

## 2024-03-03 MED ORDER — NA SULFATE-K SULFATE-MG SULF 17.5-3.13-1.6 GM/177ML PO SOLN: 1.0000 | Freq: Once | ORAL | 0 refills | Status: AC

## 2024-03-03 NOTE — Progress Notes (Signed)
 Pt's name and DOB verified at the beginning of the pre-visit wit 2 identifiers  Pt denies any difficulty with ambulating,sitting, laying down or rolling side to side  Pt has no issues moving head neck or swallowing  No egg or soy allergy known to patient   No issues known to pt with past sedation with any surgeries or procedures  Patient denies ever being intubated  No FH of Malignant Hyperthermia  Pt is not on home 02   Pt is not on blood thinners   Pt denies issues with constipation   Pt is not on dialysis  Pt denise any abnormal heart rhythms   Pt denies any upcoming cardiac testing  Patient's chart reviewed by Rogena Class CNRA prior to pre-visit and patient appropriate for the [facility].  Pre-visit completed and red dot placed by patient's name on their procedure day (on provider's schedule).    Visit by phone  Pt states weight is 229 lb  IInstructions reviewed. Pt given  both [facility] main # and MD on call # prior to instructions.  Pt states understanding of instructions. Instructed pt to review instructions again prior to procedure and call main # given if has questions.. Pt states they will.   Instructed pt on where to find instructions on My Chart.

## 2024-03-03 NOTE — Telephone Encounter (Signed)
 Attempt to reach pt for pre-visit.Mailbox full per message.  Will attempt to reach again in 5 min due to no other # listed in profile  Second attempt to reached pt

## 2024-03-07 ENCOUNTER — Telehealth: Payer: Self-pay | Admitting: *Deleted

## 2024-03-07 NOTE — Telephone Encounter (Signed)
 Patient is requesting to go over her medication of her colonoscopy procedure scheduled for June the 6 th. Patient is requesting a call back. Please advise.

## 2024-03-07 NOTE — Telephone Encounter (Signed)
 Reviewed prep instructions with pt. Verbally and had ptr get on My chart to review them together while pt was on My  Chart. Pt states she understood instructions and has no other questions at this time.

## 2024-03-08 DIAGNOSIS — F413 Other mixed anxiety disorders: Secondary | ICD-10-CM | POA: Diagnosis not present

## 2024-03-08 DIAGNOSIS — F4310 Post-traumatic stress disorder, unspecified: Secondary | ICD-10-CM | POA: Diagnosis not present

## 2024-03-08 DIAGNOSIS — F331 Major depressive disorder, recurrent, moderate: Secondary | ICD-10-CM | POA: Diagnosis not present

## 2024-03-13 ENCOUNTER — Other Ambulatory Visit: Payer: Self-pay | Admitting: Student

## 2024-03-14 NOTE — Progress Notes (Signed)
 SUBJECTIVE: Discussed the use of AI scribe software for clinical note transcription with the patient, who gave verbal consent to proceed.  Chief Complaint: Obesity  Interim History: She is up 1 pound since her last visit Down 26 pounds overall TBW loss of 10.2% April [name] is here to discuss her progress with her obesity treatment plan. She is on the Category 2 Plan and states she is following her eating plan approximately 80 % of the time. She states she is exercising 40-60 minutes 2 times per week.  April April [name] is a 63 year old female with obesity and prediabetes who presents for follow-up of her obesity treatment plan.  She has experienced a weight gain of one pound since her last visit, with a current weight of 230 pounds and a BMI of 45.0. Her total weight loss to date is 26 pounds. She is currently taking topiramate  25 mg, typically at bedtime, and we discussed she can increase the dose to 50 mg on days when cravings are more intense.  She has a history of prediabetes and emotional eating. Recent lab results show an A1c of 5.4, down from 5.8. Her insulin  level was slightly elevated; she possibly had a smoothie before the test. She is mindful of her diet, focusing on protein intake and avoiding under-eating, as she sometimes consumes as low as 900 calories a day. She is consistently journaling her intake as always, and does well overall with protein intake, but calories may be low as noted at times.   She is preparing for a colonoscopy .She is currently on a restricted diet in preparation for the procedure, avoiding nuts, beans, and red foods. She will be using Suprep for the bowel preparation.  Her social history includes regular journaling of her diet and exercise, and she is working on increasing her physical activity. She is also focused on maintaining her mental health and is actively engaged in activities such as visiting the [employer].  OBJECTIVE: Visit  Diagnoses: Problem List Items Addressed This Visit     Vitamin D  deficiency   Polyphagia   Relevant Medications   topiramate  (TOPAMAX ) 25 MG tablet   Obesity (HCC)- Start BMI 50.0   Prediabetes - Primary   Other Visit Diagnoses       BMI 45.0-49.9, adult (HCC) Current BMI 450         Obesity with polyphagia April [name] is being managed for current obesity with a BMI of 45.0 and current weight of 230 pounds. She has lost 26 pounds but gained one pound since her last visit. She is on topiramate  25 mg at bedtime, with potential increase to 50 mg during intense cravings. Emphasis on maintaining muscle mass, avoiding under-eating, and ensuring adequate protein intake to prevent metabolic slowdown was discussed. - Refill topiramate  prescription at [facility]. - Encourage consistent dietary habits and adequate protein intake. - Advise increasing topiramate  to 50 mg on days with intense cravings. - Aim for at least 1200 calories per day to prevent metabolic slowdown. Meds ordered this encounter  Medications   topiramate  (TOPAMAX ) 25 MG tablet    Sig: Take 2 tablets (50 mg total) by mouth at bedtime.    Dispense:  60 tablet    Refill:  0    **Patient requests 90 days supply**    Prediabetes Her A1c improved to 5.4 from 5.8, indicating good control. Insulin  levels were slightly elevated, possibly due to non-fasting before the test. Monitoring sugar intake and dietary control to prevent diabetes  progression was advised. Lab Results  Component Value Date   HGBA1C 5.4 02/24/2024   HGBA1C 5.8 (H) 02/05/2023   HGBA1C 5.5 03/13/2022   Lab Results  Component Value Date   LDLCALC 140 (H) 02/24/2024   CREATININE 0.61 02/24/2024   INSULIN   Date Value Ref Range Status  02/24/2024 15.5 2.6 - 24.9 uIU/mL Final  ]Continue working on nutrition plan to decrease simple carbohydrates, increase lean proteins and exercise to promote weight loss, improve glycemic control and prevent progression to  Type 2 diabetes.  - Monitor sugar intake and maintain dietary control. - Recheck insulin  levels, A1c and other labs in a few months.  Hyperlipidemia LDL cholesterol is 140, with a goal of less than 100- not at goal. Monitor saturated fat intake. HDL cholesterol is 53, within the desired range. Improvement noted since weight loss journey began. Advised to monitor saturated fat intake to manage cholesterol levels. Lab Results  Component Value Date   CHOL 214 (H) 02/24/2024   CHOL 221 (H) 08/11/2023   CHOL 200 (H) 02/05/2023   Lab Results  Component Value Date   HDL 53 02/24/2024   HDL 60 08/11/2023   HDL 58 02/05/2023   Lab Results  Component Value Date   LDLCALC 140 (H) 02/24/2024   LDLCALC 150 (H) 08/11/2023   LDLCALC 130 (H) 02/05/2023   Lab Results  Component Value Date   TRIG 120 02/24/2024   TRIG 61 08/11/2023   TRIG 68 02/05/2023   Lab Results  Component Value Date   CHOLHDL 3.8 03/13/2022   CHOLHDL 3.7 11/06/2021   CHOLHDL 3.0 04/07/2021   Lab Results  Component Value Date   LDLDIRECT 153 (H) 08/16/2020   LDLDIRECT 108 (H) 12/01/2012   - Continue monitoring saturated fat intake. - Maintain current dietary habits to manage cholesterol levels.  General Health Maintenance Preparing for a colonoscopy. Compliant with dietary restrictions. Advised on regular screenings for early detection and treatment.   Vitamin D  Deficiency Vitamin D  is not at goal of 50.  Most recent vitamin D  level was 40.1. She is on no vitamin D  currently as levels increased dramatically while on Ergo q 14 days. Lab Results  Component Value Date   VD25OH 40.1 02/24/2024   VD25OH 86.4 08/11/2023   VD25OH 60.0 02/05/2023    Plan:  Recheck vitamin D  levels over the next 3-4 months and likely resume with OTC vitamin D  next visit.  Low vitamin D  levels can be associated with adiposity and may result in leptin resistance and weight gain. Also associated with fatigue.  Currently on vitamin D   supplementation without any adverse effects such as nausea, vomiting or muscle weakness.    History of low B 12 B12 vitamin intake adjustment recommended due to high levels. - Proceed with colonoscopy preparation and procedure. - Adjust B12 vitamin intake to a couple of times a week.  Follow-up Follow-up appointments scheduled with heart specialist and dermatology. Emphasized importance of regular follow-ups for health monitoring. - Follow up with heart specialist on July 11. - Attend dermatology follow-up in September. - Schedule follow-up appointments on July 14 at 2:30 PM and August 13 at 11:00 AM.  Vitals Temp: 97.8 F (36.6 C) BP: 105/66 Pulse Rate: 60 SpO2: 96 %   Anthropometric Measurements Height: 5' (1.524 m) Weight: 230 lb (104.3 kg) BMI (Calculated): 44.92 Weight at Last Visit: 229lb Weight Lost Since Last Visit: 0 Weight Gained Since Last Visit: 1lb Starting Weight: 256lb Total Weight Loss (lbs): 26 lb (11.8  kg) Peak Weight: 268lb   Body Composition  Body Fat %: 45.6 % Fat Mass (lbs): 105.2 lbs Muscle Mass (lbs): 119 lbs Total Body Water  (lbs): 82.6 lbs Visceral Fat Rating : 16   Other Clinical Data Fasting: no Labs: no Today's Visit #: 66 Starting Date: 06/13/19     ASSESSMENT AND PLAN:  Diet: Essie is currently in the action stage of change. As such, her goal is to continue with weight loss efforts. She has agreed to Category 2 Plan.  Exercise: Wrenley has been instructed to work up to a goal of 150 minutes of combined cardio and strengthening exercise per week and to continue exercising as is for weight loss and overall health benefits.   Behavior Modification:  We discussed the following Behavioral Modification Strategies today: increasing lean protein intake, decreasing simple carbohydrates, increasing vegetables, increase H2O intake, increase high fiber foods, no skipping meals, meal planning and cooking strategies, emotional eating  strategies , avoiding temptations, and planning for success. We discussed various medication options to help Byanka with her weight loss efforts and we both agreed to continue current treatment plan, continue to work on nutritional and behavioral strategies to promote weight loss.  .  Follow up in 4-6 weeks. She was informed of the importance of frequent follow up visits to maximize her success with intensive lifestyle modifications for her multiple health conditions.  Attestation Statements:   Reviewed by clinician on day of visit: allergies, medications, problem list, medical history, surgical history, family history, social history, and previous encounter notes.   Time spent on visit including pre-visit chart review and post-visit care and charting was 31 minutes.    Sunny Gains, PA-C

## 2024-03-15 ENCOUNTER — Ambulatory Visit (INDEPENDENT_AMBULATORY_CARE_PROVIDER_SITE_OTHER): Admitting: Physician Assistant

## 2024-03-15 ENCOUNTER — Encounter (INDEPENDENT_AMBULATORY_CARE_PROVIDER_SITE_OTHER): Payer: Self-pay | Admitting: Physician Assistant

## 2024-03-15 ENCOUNTER — Other Ambulatory Visit (INDEPENDENT_AMBULATORY_CARE_PROVIDER_SITE_OTHER): Payer: Self-pay | Admitting: Physician Assistant

## 2024-03-15 VITALS — BP 105/66 | HR 60 | Temp 97.8°F | Ht 60.0 in | Wt 230.0 lb

## 2024-03-15 DIAGNOSIS — Z6841 Body Mass Index (BMI) 40.0 and over, adult: Secondary | ICD-10-CM

## 2024-03-15 DIAGNOSIS — R7303 Prediabetes: Secondary | ICD-10-CM | POA: Diagnosis not present

## 2024-03-15 DIAGNOSIS — E559 Vitamin D deficiency, unspecified: Secondary | ICD-10-CM | POA: Diagnosis not present

## 2024-03-15 DIAGNOSIS — E669 Obesity, unspecified: Secondary | ICD-10-CM | POA: Diagnosis not present

## 2024-03-15 DIAGNOSIS — R632 Polyphagia: Secondary | ICD-10-CM | POA: Diagnosis not present

## 2024-03-15 MED ORDER — TOPIRAMATE 25 MG PO TABS: 50.0000 mg | ORAL_TABLET | Freq: Every evening | ORAL | 0 refills | Status: DC

## 2024-03-16 NOTE — Telephone Encounter (Signed)
 RN returned patient's phone call asking about medications that need to be held. Patient wanted to confirm which medications needed to be held and why. RN explained that patient's meloxicam  was to be held d/t its blood thinning qualities. Patient stated that she has not taken meloxicam  recently and stated appreciation for the explanation. RN asked if patient had any further questions; patient stated, "No."  RN concluded conversation by confirming the date and time of patient's procedure.

## 2024-03-16 NOTE — Telephone Encounter (Signed)
 Inbound call from patient, would like to speak to a nurse in regards to medications hold prior to procedure

## 2024-03-17 ENCOUNTER — Telehealth: Payer: Self-pay

## 2024-03-17 ENCOUNTER — Other Ambulatory Visit: Payer: Self-pay

## 2024-03-17 ENCOUNTER — Ambulatory Visit: Admitting: Internal Medicine

## 2024-03-17 ENCOUNTER — Encounter: Payer: Self-pay | Admitting: Internal Medicine

## 2024-03-17 VITALS — BP 129/69 | HR 55 | Temp 98.1°F | Resp 14 | Ht 60.0 in | Wt 229.0 lb

## 2024-03-17 DIAGNOSIS — F32A Depression, unspecified: Secondary | ICD-10-CM | POA: Diagnosis not present

## 2024-03-17 DIAGNOSIS — K5730 Diverticulosis of large intestine without perforation or abscess without bleeding: Secondary | ICD-10-CM

## 2024-03-17 DIAGNOSIS — Z1211 Encounter for screening for malignant neoplasm of colon: Secondary | ICD-10-CM | POA: Diagnosis not present

## 2024-03-17 DIAGNOSIS — K648 Other hemorrhoids: Secondary | ICD-10-CM | POA: Diagnosis not present

## 2024-03-17 DIAGNOSIS — E785 Hyperlipidemia, unspecified: Secondary | ICD-10-CM | POA: Diagnosis not present

## 2024-03-17 DIAGNOSIS — E669 Obesity, unspecified: Secondary | ICD-10-CM | POA: Diagnosis not present

## 2024-03-17 DIAGNOSIS — F419 Anxiety disorder, unspecified: Secondary | ICD-10-CM | POA: Diagnosis not present

## 2024-03-17 MED ORDER — SODIUM CHLORIDE 0.9 % IV SOLN: 500.0000 mL | Freq: Once | INTRAVENOUS | Status: DC
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
## 2024-03-17 NOTE — Patient Instructions (Signed)
-  Handout on diverticulosis and hemorrhoids provided. -Will plan to get a CT colonography test. Office will call to schedule.   -Continue present medications.  YOU HAD AN ENDOSCOPIC PROCEDURE TODAY AT THE [facility]:   Refer to the procedure report that was given to you for any specific questions about what was found during the examination.  If the procedure report does not answer your questions, please call your gastroenterologist to clarify.  If you requested that your care partner not be given the details of your procedure findings, then the procedure report has been included in a sealed envelope for you to review at your convenience later.  YOU SHOULD EXPECT: Some feelings of bloating in the abdomen. Passage of more gas than usual.  Walking can help get rid of the air that was put into your GI tract during the procedure and reduce the bloating. If you had a lower endoscopy (such as a colonoscopy or flexible sigmoidoscopy) you may notice spotting of blood in your stool or on the toilet paper. If you underwent a bowel prep for your procedure, you may not have a normal bowel movement for a few days.  Please Note:  You might notice some irritation and congestion in your nose or some drainage.  This is from the oxygen used during your procedure.  There is no need for concern and it should clear up in a day or so.  SYMPTOMS TO REPORT IMMEDIATELY:  Following lower endoscopy (colonoscopy or flexible sigmoidoscopy):  Excessive amounts of blood in the stool  Significant tenderness or worsening of abdominal pains  Swelling of the abdomen that is new, acute  Fever of 100F or higher  For urgent or emergent issues, a gastroenterologist can be reached at any hour by calling [phone]. Do not use MyChart messaging for urgent concerns.    DIET:  We do recommend a small meal at first, but then you may proceed to your regular diet.  Drink plenty of fluids but you should avoid alcoholic  beverages for 24 hours.  ACTIVITY:  You should plan to take it easy for the rest of today and you should NOT DRIVE or use heavy machinery until tomorrow (because of the sedation medicines used during the test).    FOLLOW UP: Our staff will call the number listed on your records the next business day following your procedure.  We will call around 7:15- 8:00 am to check on you and address any questions or concerns that you may have regarding the information given to you following your procedure. If we do not reach you, we will leave a message.     If any biopsies were taken you will be contacted by phone or by letter within the next 1-3 weeks.  Please call us  at [phone] if you have not heard about the biopsies in 3 weeks.    SIGNATURES/CONFIDENTIALITY: You and/or your care partner have signed paperwork which will be entered into your electronic medical record.  These signatures attest to the fact that that the information above on your After Visit Summary has been reviewed and is understood.  Full responsibility of the confidentiality of this discharge information lies with you and/or your care-partner.

## 2024-03-17 NOTE — Progress Notes (Signed)
 Sedate, gd SR, tolerated procedure well, VSS, report to RN

## 2024-03-17 NOTE — Progress Notes (Signed)
 Pt's states no medical or surgical changes since previsit or office visit.

## 2024-03-17 NOTE — Telephone Encounter (Signed)
-----   Message from Daina Drum sent at 03/17/2024  2:29 PM EDT ----- Pod B, let's this patient scheduled for a CT colonography please due to incomplete colonoscopy.

## 2024-03-17 NOTE — Telephone Encounter (Signed)
 Pt was made aware of Dr. Rosaline Coma recommendations  Pt was ordered a CT virtual Colonoscopy to be done at [facility]. Pt was made aware. Pt notified that they will contact her and if she has not heard anything form their office in 1-2 weeks then to call our office back.  Pt verbalized understanding with all questions answered.

## 2024-03-17 NOTE — Op Note (Addendum)
 [facility] Patient Name: April [name] Procedure Date: 03/17/2024 1:38 PM MRN: [id number] Endoscopist: Pedro Bourgeois , , [id number] Age: 63 Referring MD:  Date of Birth: Aug 25, 1961 Gender: Female Account #: [id number] Procedure:                Colonoscopy Indications:              Screening for colorectal malignant neoplasm Medicines:                Monitored Anesthesia Care Procedure:                Pre-Anesthesia Assessment:                           - Prior to the procedure, a History and Physical                            was performed, and patient medications and                            allergies were reviewed. The patient's tolerance of                            previous anesthesia was also reviewed. The risks                            and benefits of the procedure and the sedation                            options and risks were discussed with the patient.                            All questions were answered, and informed consent                            was obtained. Prior Anticoagulants: The patient has                            taken no anticoagulant or antiplatelet agents. ASA                            Grade Assessment: III - A patient with severe                            systemic disease. After reviewing the risks and                            benefits, the patient was deemed in satisfactory                            condition to undergo the procedure.                           After obtaining informed consent, the colonoscope  was passed under direct vision. Throughout the                            procedure, the patient's blood pressure, pulse, and                            oxygen saturations were monitored continuously. The                            Colonoscope was introduced through the anus and                            advanced to the the descending colon. The patient                             tolerated the procedure well. The quality of the                            bowel preparation was excellent. The rectum was                            photographed. The colonoscopy was performed with                            difficulty due to restricted mobility of the colon.                            Unfortunately despite multiple position changes,                            applications of abdominal pressure, and changing to                            a pediatric colonoscope, there was an area of sharp                            angulation in the descending colon that was not                            able to be bypassed. Scope In: 1:50:49 PM Scope Out: 2:20:24 PM Total Procedure Duration: 0 hours 29 minutes 35 seconds  Findings:                 Multiple diverticula were found in the sigmoid                            colon and descending colon. It is suspected that                            diverticula caused the development of sharp                            angulation in the descending colon that was not  able to be traversed by the colonoscope despite                            position changes, application of abdominal                            pressure, and changing to a pediatric colonoscope.                           Non-bleeding internal hemorrhoids were found during                            retroflexion. Complications:            No immediate complications. Estimated Blood Loss:     Estimated blood loss: none. Impression:               - Diverticulosis in the sigmoid colon and in the                            descending colon.                           - Non-bleeding internal hemorrhoids.                           - No specimens collected. Recommendation:           - Discharge patient to home (with escort).                           - We will plan to get the patient set up for a CT                            colonography test.                            - The findings and recommendations were discussed                            with the patient. Dr Pedro Bourgeois "[address]" [place],  03/17/2024 2:28:17 PM

## 2024-03-20 ENCOUNTER — Telehealth: Payer: Self-pay | Admitting: *Deleted

## 2024-03-20 NOTE — Telephone Encounter (Signed)
  Follow up Call-     03/17/2024   12:52 PM  Call back number  Post procedure Call Back phone  # [phone]  Permission to leave phone message Yes     Patient questions:  Do you have a fever, pain , or abdominal swelling? No. Pain Score  0 *  Have you tolerated food without any problems? Yes.    Have you been able to return to your normal activities? Yes.    Do you have any questions about your discharge instructions: Diet   No. Medications  No. Follow up visit  No.  Do you have questions or concerns about your Care? No.  Actions: * If pain score is 4 or above: No action needed, pain <4.

## 2024-03-29 ENCOUNTER — Telehealth: Payer: Self-pay | Admitting: Internal Medicine

## 2024-03-29 NOTE — Telephone Encounter (Signed)
 CT Virtual Colonoscopy  Case went to further clinical review, it doesn't meet medical necessity criteria  A provider or a physician assistant can call to provider further info on this exam  Peer to peer [phone]  Case# [id number]

## 2024-03-29 NOTE — Telephone Encounter (Signed)
 I called the peer-to-peer number and was able to get approval for the CT virtual colonoscopy.  Authorization # [id number] Valid today 03/29/24 until 05/27/24

## 2024-03-29 NOTE — Telephone Encounter (Signed)
 Please see note below and review and advise

## 2024-04-05 ENCOUNTER — Ambulatory Visit: Payer: Self-pay | Admitting: Internal Medicine

## 2024-04-05 ENCOUNTER — Ambulatory Visit
Admission: RE | Admit: 2024-04-05 | Discharge: 2024-04-05 | Discharge status: Home / Self Care | Source: Ambulatory Visit | Attending: Internal Medicine | Admitting: Internal Medicine

## 2024-04-05 DIAGNOSIS — Z1211 Encounter for screening for malignant neoplasm of colon: Secondary | ICD-10-CM

## 2024-04-05 DIAGNOSIS — K439 Ventral hernia without obstruction or gangrene: Secondary | ICD-10-CM | POA: Diagnosis not present
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
## 2024-04-05 NOTE — Progress Notes (Signed)
 Pod B triage, I sent the patient a MyChart message about the results of her CT colonography. 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 If she is amenable, I would like to refer her to general surgery to discuss whether or not surgical repair would be recommended for her. Since her CT colonography was not able to be completed due to this hernia, we can offer her a Cologuard for colon cancer screening instead (patient should understand that if the test was positive, not clear how we would be able to evaluate this further unless she were to have her hernia surgically repaired). Alternatively we could just hold off on doing any further colon cancer screening tests if she opts to not have her hernia repaired.

## 2024-04-06 ENCOUNTER — Other Ambulatory Visit: Payer: Self-pay

## 2024-04-06 DIAGNOSIS — Z1211 Encounter for screening for malignant neoplasm of colon: Secondary | ICD-10-CM
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
## 2024-04-13 ENCOUNTER — Ambulatory Visit (INDEPENDENT_AMBULATORY_CARE_PROVIDER_SITE_OTHER): Admitting: Psychiatry

## 2024-04-13 VITALS — BP 137/76 | HR 77 | Wt 231.2 lb

## 2024-04-13 DIAGNOSIS — F1220 Cannabis dependence, uncomplicated: Secondary | ICD-10-CM | POA: Diagnosis not present

## 2024-04-13 DIAGNOSIS — F19959 Other psychoactive substance use, unspecified with psychoactive substance-induced psychotic disorder, unspecified: Secondary | ICD-10-CM | POA: Diagnosis not present

## 2024-04-13 DIAGNOSIS — F1290 Cannabis use, unspecified, uncomplicated: Secondary | ICD-10-CM

## 2024-04-13 HISTORY — DX: Cannabis use, unspecified, uncomplicated: F12.90

## 2024-04-13 MED ORDER — TRAZODONE HCL 50 MG PO TABS: 50.0000 mg | ORAL_TABLET | Freq: Every evening | ORAL | 2 refills | Status: DC | PRN

## 2024-04-15 DIAGNOSIS — Z1211 Encounter for screening for malignant neoplasm of colon: Secondary | ICD-10-CM | POA: Diagnosis not present
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
## 2024-04-19 DIAGNOSIS — F413 Other mixed anxiety disorders: Secondary | ICD-10-CM | POA: Diagnosis not present

## 2024-04-19 DIAGNOSIS — F331 Major depressive disorder, recurrent, moderate: Secondary | ICD-10-CM | POA: Diagnosis not present

## 2024-04-19 DIAGNOSIS — F4310 Post-traumatic stress disorder, unspecified: Secondary | ICD-10-CM | POA: Diagnosis not present

## 2024-04-21 ENCOUNTER — Ambulatory Visit: Attending: Cardiology | Admitting: Cardiology

## 2024-04-21 ENCOUNTER — Encounter: Payer: Self-pay | Admitting: Cardiology

## 2024-04-21 VITALS — BP 126/64 | HR 62 | Ht 60.0 in | Wt 232.8 lb

## 2024-04-21 DIAGNOSIS — R7303 Prediabetes: Secondary | ICD-10-CM | POA: Diagnosis not present

## 2024-04-21 DIAGNOSIS — I10 Essential (primary) hypertension: Secondary | ICD-10-CM | POA: Insufficient documentation

## 2024-04-21 DIAGNOSIS — R0789 Other chest pain: Secondary | ICD-10-CM | POA: Insufficient documentation

## 2024-04-21 DIAGNOSIS — E7800 Pure hypercholesterolemia, unspecified: Secondary | ICD-10-CM | POA: Diagnosis not present

## 2024-04-21 MED ORDER — METOPROLOL TARTRATE 50 MG PO TABS: ORAL_TABLET | ORAL | 0 refills | Status: DC

## 2024-04-21 NOTE — Patient Instructions (Addendum)
 Medication Instructions:  Continue same medications  Lab Work: Bmet  today  Testing/Procedures: Coronary CT  will be scheduled after approved by insurance   Follow instructions below   Follow-Up: At [facility], you and your health needs are our priority.  As part of our continuing mission to provide you with exceptional heart care, our providers are all part of one team.  This team includes your primary Cardiologist (physician) and Advanced Practice Providers or APPs (Physician Assistants and Nurse Practitioners) who all work together to provide you with the care you need, when you need it.  Your next appointment:  To Be Determined after test    Provider:  Dr.Jordan      Your cardiac CT will be scheduled at one of the below locations:   [facility] [address] [place], [place] [address] [phone]  OR  [facility] [address] [place], [place] [address] [phone]  OR   [facility] [address] [place], [place] [address] [phone]  OR   [facility] [address] [place], [place] [address] [phone]  OR   [facility] [address]  [place], [place] [address]  If scheduled at [facility], please arrive at the [facility] and Children's Entrance (Entrance C2) of [facility] 30 minutes prior to test start time. You can use the FREE valet parking offered at entrance C (encouraged to control the heart rate for the test)  Proceed to the [facility] Radiology Department (first floor) to check-in and test prep.   All radiology patients and guests should use entrance C2 at [facility], accessed from [facility], even though the hospital's physical address listed is [address].    If scheduled at the Heart and Vascular Tower at [address], please enter the parking  lot using the [address] entrance and use the FREE valet service at the patient drop-off area. Enter the buidling and check-in with registration on the main floor.  If scheduled at [facility] or [facility], please arrive 15 mins early for check-in and test prep.  There is spacious parking and easy access to the radiology department from the [facility] V[facility] entrance. Please enter here and check-in with the desk attendant.   If scheduled at [facility], please arrive 30 minutes early for check-in and test prep.  Please follow these instructions carefully (unless otherwise directed):  An IV will be required for this test and Nitroglycerin  will be given.    On the Night Before the Test: Be sure to Drink plenty of water . Do not consume any caffeinated/decaffeinated beverages or chocolate 12 hours prior to your test. Do not take any antihistamines 12 hours prior to your test.   On the Day of the Test: Drink plenty of water  until 1 hour prior to the test. Do not eat any food 1 hour prior to test. You may take your regular medications prior to the test.  Take metoprolol  50 mg two hours prior to test. If you take Furosemide/Hydrochlorothiazide /Spironolactone/Chlorthalidone, please HOLD on the morning of the test. FEMALES- please wear underwire-free bra if available, avoid dresses & tight clothing       After the Test: Drink plenty of water . After receiving IV contrast, you may experience a mild flushed feeling. This is normal. On occasion, you may experience a mild  rash up to 24 hours after the test. This is not dangerous. If this occurs, you can take Benadryl  25 mg, Zyrtec , Claritin, or Allegra and increase your fluid intake. (Patients taking Tikosyn should avoid Benadryl , and may take Zyrtec , Claritin, or Allegra) If you experience trouble breathing, this can be serious. If it is severe call 911 IMMEDIATELY. If it is  mild, please call our office.  We will call to schedule your test 2-4 weeks out understanding that some insurance companies will need an authorization prior to the service being performed.   For more information and frequently asked questions, please visit our website : [email or web address]  For non-scheduling related questions, please contact the cardiac imaging nurse navigator should you have any questions/concerns: Cardiac Imaging Nurse Navigators Direct Office Dial: [phone]   For scheduling needs, including cancellations and rescheduling, please call [place], [phone].   We recommend signing up for the patient portal called MyChart.  Sign up information is provided on this After Visit Summary.  MyChart is used to connect with patients for Virtual Visits (Telemedicine).  Patients are able to view lab/test results, encounter notes, upcoming appointments, etc.  Non-urgent messages can be sent to your provider as well.   To learn more about what you can do with MyChart, go to [email or web address].

## 2024-04-22 ENCOUNTER — Ambulatory Visit: Payer: Self-pay | Admitting: Cardiology

## 2024-04-22 LAB — BASIC METABOLIC PANEL WITH GFR
BUN/Creatinine Ratio: 32 — ABNORMAL HIGH (ref 12–28)
BUN: 20 mg/dL (ref 8–27)
CO2: 19 mmol/L — ABNORMAL LOW (ref 20–29)
Calcium: 9.3 mg/dL (ref 8.7–10.3)
Chloride: 104 mmol/L (ref 96–106)
Creatinine, Ser: 0.62 mg/dL (ref 0.57–1.00)
Glucose: 75 mg/dL (ref 70–99)
Potassium: 4.4 mmol/L (ref 3.5–5.2)
Sodium: 145 mmol/L — ABNORMAL HIGH (ref 134–144)
eGFR: 101 mL/min/1.73 (ref >59)

## 2024-04-23 NOTE — Progress Notes (Signed)
 SUBJECTIVE: Discussed the use of AI scribe software for clinical note transcription with the patient, who gave verbal consent to proceed.  Chief Complaint: Obesity  Interim History: She is up 2 lbs since last visit.  Down 24 lbs overall TBW loss of 9.4% April [name] is here to discuss her progress with her obesity treatment plan. She is on the Category 2 Plan and states she is following her eating plan approximately 85 % of the time. She states she is exercising 90+ minutes 2 times per week. April [name] is a 63 year old female with prediabetes, hyperlipidemia, and obesity who presents for follow-up of her treatment plan.  She experiences intermittent chest pain located in the mid anterior chest. An echocardiogram in June 2024 showed normal left ventricular function and trivial mitral valve regurgitation. A coronary CT scan is scheduled for later this week.  Regarding obesity management, she has gained two pounds since her last visit but increased her muscle mass from 119 pounds to 123.2 pounds. Her adipose mass decreased from 105.2 pounds to 102.8 pounds, and her overall adipose percentage decreased from 45.6% to 44.2%. She attributes the muscle gain to regular workouts at the [facility] and is considering dietary adjustments, focusing on protein intake. She enjoys smoothies with strawberry, banana, and peanut butter, and is mindful of sugar content, opting to remove added sugars when possible.  She recently underwent a colonoscopy, which was unable to visualize the entire colon due to a hernia. 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 She is not experiencing any abdominal pain currently and is awaiting further consultation regarding the hernia.  She has a history of diverticulosis, noted during her colonoscopy.  OBJECTIVE: Visit Diagnoses: Problem List Items Addressed This Visit     Vitamin D  deficiency   Polyphagia   Osteoarthritis of left knee    Obesity (HCC)- Start BMI 50.0   Hyperlipidemia, pure   Prediabetes - Primary   Other Visit Diagnoses       BMI 45.0-49.9, adult (HCC) Current BMI 45.3         Chest Pain/Work up for CAD underway Intermittent chest pain in the mid anterior chest. Echocardiogram in June 2024 showed normal left ventricular function and trivial mitral valve regurgitation. Given risk factors of hyperlipidemia, obesity, and prediabetes, a coronary CT scan is scheduled for later this week to assess coronary artery disease risk. - Perform coronary CT scan later this week  Hernia Moderate to large left lower quadrant sphenoidal hernia identified during CT colonography. No abdominal pain reported. Potential risk of bowel twisting and obstruction if symptoms develop. Advised on the importance of seeking immediate medical attention if experiencing abdominal pain due to the risk of life-threatening complications. - Discuss hernia management with specialists next week - Advise to seek immediate medical attention if experiencing abdominal pain  Diverticulosis Presence of multiple diverticula noted during colonoscopy, which made it difficult to advance the scope. No current symptoms reported. Discussed dietary modifications to prevent complications. - Educate on dietary modifications to prevent complications  Obesity with polyphagia Weight increased by 2 pounds since last visit, but muscle mass increased from 119 pounds to 123.2 pounds, and adipose mass decreased from 105.2 pounds to 102.8 pounds. Adipose percentage decreased to 44.2% from 45.6%. Engaging in physical activity and making dietary adjustments, including reducing sugar intake in smoothies and considering more vegetarian options. Discussed the benefits of muscle gain in increasing metabolic rate and aiding in fat loss. - Continue physical  activity and focus on protein intake - Reduce sugar intake in smoothies by requesting no added sugar or using alternatives  like Splenda - Consider incorporating more vegetarian options into diet, such as beans, tofu, and fish - Continue monitoring weight and body composition  General Health Maintenance Engaged in health maintenance activities, including weight management and dietary adjustments. Colonoscopy was performed, and a coronary CT scan is scheduled. - Continue regular health screenings and follow-up appointments - Maintain a balanced diet and regular exercise regimen  Follow-up Scheduled follow-up appointments to monitor health conditions and progress. - Follow-up appointment on May 24, 2024, at 11 AM - Schedule follow-up appointment for June 21, 2024, at 11 AM   Vitals Temp: 98.2 F (36.8 C) BP: 105/68 Pulse Rate: 70 SpO2: 96 %   Anthropometric Measurements Height: 5' (1.524 m) Weight: 232 lb (105.2 kg) BMI (Calculated): 45.31 Weight at Last Visit: 230lb Weight Lost Since Last Visit: 0 Weight Gained Since Last Visit: 2lb Starting Weight: 256lb Total Weight Loss (lbs): 24 lb (10.9 kg) Peak Weight: 268lb   Body Composition  Body Fat %: 44.2 % Fat Mass (lbs): 102.8 lbs Muscle Mass (lbs): 123.2 lbs Total Body Water  (lbs): 83.4 lbs Visceral Fat Rating : 16   Other Clinical Data Fasting: no Labs: no Today's Visit #: 6 Starting Date: 06/24/19     ASSESSMENT AND PLAN:  Diet: April [name] is currently in the action stage of change. As such, her goal is to continue with weight loss efforts. She has agreed to Category 2 Plan.  Exercise: April [name] has been instructed to work up to a goal of 150 minutes of combined cardio and strengthening exercise per week and to continue exercising as is for weight loss and overall health benefits.   Behavior Modification:  We discussed the following Behavioral Modification Strategies today: increasing lean protein intake, decreasing simple carbohydrates, increasing vegetables, increase H2O intake, increase high fiber foods, no skipping  meals, meal planning and cooking strategies, avoiding temptations, and planning for success. We discussed various medication options to help April [name] with her weight loss efforts and we both agreed to continue current treatment plan.  Return in about 4 weeks (around 05/22/2024).April [name] She was informed of the importance of frequent follow up visits to maximize her success with intensive lifestyle modifications for her multiple health conditions.  Attestation Statements:   Reviewed by clinician on day of visit: allergies, medications, problem list, medical history, surgical history, family history, social history, and previous encounter notes.   Time spent on visit including pre-visit chart review and post-visit care and charting was 35 minutes.    Christabell Loseke, PA-C

## 2024-04-24 ENCOUNTER — Encounter (INDEPENDENT_AMBULATORY_CARE_PROVIDER_SITE_OTHER): Payer: Self-pay | Admitting: Physician Assistant

## 2024-04-24 ENCOUNTER — Ambulatory Visit (INDEPENDENT_AMBULATORY_CARE_PROVIDER_SITE_OTHER): Admitting: Physician Assistant

## 2024-04-24 VITALS — BP 105/68 | HR 70 | Temp 98.2°F | Ht 60.0 in | Wt 232.0 lb

## 2024-04-24 DIAGNOSIS — K5790 Diverticulosis of intestine, part unspecified, without perforation or abscess without bleeding: Secondary | ICD-10-CM | POA: Diagnosis not present

## 2024-04-24 DIAGNOSIS — Z6841 Body Mass Index (BMI) 40.0 and over, adult: Secondary | ICD-10-CM

## 2024-04-24 DIAGNOSIS — K469 Unspecified abdominal hernia without obstruction or gangrene: Secondary | ICD-10-CM | POA: Diagnosis not present

## 2024-04-24 DIAGNOSIS — E669 Obesity, unspecified: Secondary | ICD-10-CM | POA: Diagnosis not present

## 2024-04-24 DIAGNOSIS — K458 Other specified abdominal hernia without obstruction or gangrene: Secondary | ICD-10-CM

## 2024-04-24 DIAGNOSIS — E781 Pure hyperglyceridemia: Secondary | ICD-10-CM | POA: Diagnosis not present

## 2024-04-24 DIAGNOSIS — E559 Vitamin D deficiency, unspecified: Secondary | ICD-10-CM

## 2024-04-24 DIAGNOSIS — R0789 Other chest pain: Secondary | ICD-10-CM | POA: Diagnosis not present

## 2024-04-24 DIAGNOSIS — E7849 Other hyperlipidemia: Secondary | ICD-10-CM

## 2024-04-24 DIAGNOSIS — R7303 Prediabetes: Secondary | ICD-10-CM

## 2024-04-24 DIAGNOSIS — R632 Polyphagia: Secondary | ICD-10-CM

## 2024-04-24 DIAGNOSIS — M1712 Unilateral primary osteoarthritis, left knee: Secondary | ICD-10-CM

## 2024-04-25 LAB — COLOGUARD: COLOGUARD: NEGATIVE

## 2024-04-26 ENCOUNTER — Ambulatory Visit: Payer: Self-pay | Admitting: Internal Medicine

## 2024-04-27 ENCOUNTER — Telehealth (HOSPITAL_COMMUNITY): Payer: Self-pay | Admitting: *Deleted

## 2024-04-27 NOTE — Telephone Encounter (Signed)
 Reaching out to patient to offer assistance regarding upcoming cardiac imaging study; pt verbalizes understanding of appt date/time, parking situation and where to check in, pre-test NPO status and medications ordered, and verified current allergies; name and call back number provided for further questions should they arise Johney Frame RN Navigator Cardiac Imaging [facility] [phone] office [phone] cell

## 2024-04-28 ENCOUNTER — Ambulatory Visit (HOSPITAL_COMMUNITY)
Admission: RE | Admit: 2024-04-28 | Discharge: 2024-04-28 | Disposition: A | Discharge status: Home / Self Care | Source: Ambulatory Visit | Attending: Cardiology | Admitting: Cardiology

## 2024-04-28 ENCOUNTER — Encounter: Payer: Self-pay | Admitting: Advanced Practice Midwife

## 2024-04-28 DIAGNOSIS — R0789 Other chest pain: Secondary | ICD-10-CM

## 2024-04-28 DIAGNOSIS — E7800 Pure hypercholesterolemia, unspecified: Secondary | ICD-10-CM | POA: Diagnosis not present

## 2024-04-28 DIAGNOSIS — R7303 Prediabetes: Secondary | ICD-10-CM | POA: Insufficient documentation

## 2024-04-28 DIAGNOSIS — I2510 Atherosclerotic heart disease of native coronary artery without angina pectoris: Secondary | ICD-10-CM | POA: Diagnosis not present

## 2024-04-28 DIAGNOSIS — I10 Essential (primary) hypertension: Secondary | ICD-10-CM | POA: Insufficient documentation

## 2024-04-28 MED ORDER — NITROGLYCERIN 0.4 MG SL SUBL
0.8000 mg | SUBLINGUAL_TABLET | Freq: Once | SUBLINGUAL | Status: AC
  Administered 2024-04-28: 0.8 mg via SUBLINGUAL

## 2024-04-28 MED ORDER — IOHEXOL 350 MG/ML SOLN
125.0000 mL | Freq: Once | INTRAVENOUS | Status: AC | PRN
  Administered 2024-04-28: 125 mL via INTRAVENOUS

## 2024-05-01 ENCOUNTER — Ambulatory Visit: Payer: Self-pay | Admitting: Surgery

## 2024-05-01 DIAGNOSIS — K436 Other and unspecified ventral hernia with obstruction, without gangrene: Secondary | ICD-10-CM | POA: Diagnosis not present

## 2024-05-01 DIAGNOSIS — Z6841 Body Mass Index (BMI) 40.0 and over, adult: Secondary | ICD-10-CM | POA: Diagnosis not present

## 2024-05-04 ENCOUNTER — Encounter: Payer: Self-pay | Admitting: Student

## 2024-05-04 ENCOUNTER — Ambulatory Visit: Admitting: Student

## 2024-05-04 VITALS — BP 134/83 | HR 70 | Ht 60.0 in | Wt 234.0 lb

## 2024-05-04 DIAGNOSIS — G5731 Lesion of lateral popliteal nerve, right lower limb: Secondary | ICD-10-CM | POA: Diagnosis not present

## 2024-05-04 MED ORDER — GABAPENTIN 100 MG PO CAPS: 100.0000 mg | ORAL_CAPSULE | Freq: Three times a day (TID) | ORAL | 1 refills | Status: AC

## 2024-05-04 NOTE — Progress Notes (Signed)
    SUBJECTIVE:   CHIEF COMPLAINT / HPI:   Right leg neuropathy Numbness, tingling. After standing and walking, not when waking up. Eventually goes away after seconds when sitting. Knee replacement on that side. No rashes. No daily alcohol. 3 times a week, 1-2 glasses of wine. Intermittent canabis use 2-3 times a week. Numbness on lateral side of foot. No incontinence, no gait disturbance, no back pain with radiation. No trobuel with flexion/ extension of back.    OBJECTIVE:   BP 134/83   Pulse 70   Ht 5' (1.524 m)   Wt 234 lb (106.1 kg)   SpO2 100%   BMI 45.70 kg/m    General: NAD, well-appearing, well-nourished Respiratory: No respiratory distress, breathing comfortably, able to speak in full sentences Skin: warm and dry, no rashes noted on exposed skin Psych: Appropriate affect and mood R foot: No gross deformity, no ecchymosis, no swelling.  No rash.  No TTP.  Tinel test negative over tarsal tunnel.  Unable to elicit symptoms with dorsiflexion/inversion of ankle with extension of knee.  5/5 strength.  Normal sensation.  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  Lower concern for radiculopathy from lower back pathology. - Referral to PT - 100 mg gabapentin  3 times daily - Follow-up in 2-3 months after initiating PT   Gladis Church, DO [name]

## 2024-05-04 NOTE — Patient Instructions (Signed)
 It was great to see you! Thank you for allowing me to participate in your care!   I recommend that you always bring your medications to each appointment as this makes it easy to ensure we are on the correct medications and helps us  not miss when refills are needed.  Our plans for today:  - Please take 100 mg of Gabapentin  three times daily as needed for numbness and tingling. Do not operate machinery or drive while taking this medication, until you know that it does not sedate you. - I have sent referral to Physical therapy. Expect a call in 2-4 weeks to schedule. - follow-up with Dr. Howell in 2-3 months after you have started physical therapy to see how you are doing  Take care and seek immediate care sooner if you develop any concerns. Please remember to show up 15 minutes before your scheduled appointment time!  Gladis Howell, DO [facility]

## 2024-05-15 ENCOUNTER — Encounter (INDEPENDENT_AMBULATORY_CARE_PROVIDER_SITE_OTHER): Admitting: Adult Health

## 2024-05-18 DIAGNOSIS — M1712 Unilateral primary osteoarthritis, left knee: Secondary | ICD-10-CM | POA: Diagnosis not present

## 2024-05-22 ENCOUNTER — Other Ambulatory Visit: Payer: Self-pay

## 2024-05-22 DIAGNOSIS — E7800 Pure hypercholesterolemia, unspecified: Secondary | ICD-10-CM

## 2024-05-22 MED ORDER — ROSUVASTATIN CALCIUM 10 MG PO TABS
10.0000 mg | ORAL_TABLET | Freq: Every day | ORAL | 6 refills | Status: DC
Start: 2024-05-22 — End: 2024-08-20

## 2024-05-23 ENCOUNTER — Telehealth: Payer: Self-pay

## 2024-05-23 NOTE — Telephone Encounter (Signed)
 Spoke to patient Dr.Jordan advised follow up visit in 1 year 05/2025.Advised to call in April to schedule a August appointment.Advised to call sooner if needed.

## 2024-05-23 NOTE — Progress Notes (Signed)
 SUBJECTIVE: Discussed the use of AI scribe software for clinical note transcription with the patient, who gave verbal consent to proceed.  Chief Complaint: Obesity  Interim History: She is down 3 lbs since her last visit Down 27 lbs overall TBW Loss of 10.5%  April [name] is here to discuss her progress with her obesity treatment plan. She is on the Category 2 Plan and keeping a food journal and adhering to recommended goals of 1200 calories and 85+ grams of protein and states she is following her eating plan approximately 80 % of the time. She states she is exercising-walking/[employer] class 40/120 minutes 2/2 times per week. April [name] is a 63 year old female with obesity, prediabetes, and hypertension who presents for follow-up on her obesity treatment plan.  She has been actively working on her diet, incorporating high-protein options such as ground malawi, chicken bone broth, and eggs, resulting in a recent weight loss of three pounds. She is considering different high-protein cereals and is cautious about fiber intake due to her upcoming surgery.  She recently attempted a colonoscopy, which was not completed due to concerns for an internal hernia. A subsequent abdominal CT scan identified a left lower quadrant abdominal hernia, likely a Spigellian hernia. She is scheduled for hernia repair surgery in September.  She is currently taking Topamax  (topiramate ) and is unsure of its effectiveness in controlling cravings.  She recently underwent a coronary CT scan which showed some plaque but no blockage. She is starting Crestor  to manage her cholesterol levels. Her sleep is improving, and she is preparing for her upcoming surgery with plans to have her daughter assist her post-operatively. No excessive stress about the surgery.  OBJECTIVE: Visit Diagnoses: Problem List Items Addressed This Visit     Polyphagia   Obesity (HCC)- Start BMI 50.0   BMI 40.0-44.9, adult (HCC) Current BMI 43.4    Prediabetes   Other Visit Diagnoses       Spigelian hernia    -  Primary     CAD due to lipid rich plaque-mild non obstructive plaque of LAD on CCScore         Left lower quadrant abdominal hernia (probable Spigelian) Identified on abdominal CT scan. Scheduled for surgical repair with Dr. Elspeth Schultze using robotic technique. Surgery expected to be minimally invasive.  - anticipates hernia repair surgery on June 23, 2024 - Pre-operative blood work and workup scheduled for June 13, 2024 - Discuss post-operative dietary needs with surgeon, including potential use of protein shakes if needed    Obesity with polyphagia Ongoing management with dietary modifications. Weight loss of three pounds noted, indicating progress.  - Continue high-protein diet with lean meats and low saturated fat options - Consider increasing Topamax  to two tablets (50 mg) if cravings persist -Continue to monitor calories and protein intake.   Prediabetes Last A1c was 5.4- at goal. Insulin  15.5- not at goal of < 5 and slightly increased  Medication(s): Topiramate  25 mg nightly Polyphagia:No- not when eating on plan  She is hesitant to consider new medications.  Does not want additional medication like metformin.  Lab Results  Component Value Date   HGBA1C 5.4 02/24/2024   HGBA1C 5.8 (H) 02/05/2023   HGBA1C 5.5 03/13/2022   HGBA1C 5.6 11/06/2021   HGBA1C 5.5 04/29/2021   Lab Results  Component Value Date   INSULIN  15.5 02/24/2024   INSULIN  6.2 08/11/2023   INSULIN  9.1 02/05/2023   INSULIN  7.3 11/06/2021   INSULIN  3.6 04/07/2021  Plan: Continue and increase dose Topiramate  50 mg nightly Reports no refill needed as has been hesitant to increase to 50 mg, but does endorse frequent chocolate cravings.  Continue working on nutrition plan to decrease simple carbohydrates, increase lean proteins and exercise to promote weight loss, improve glycemic control and prevent progression to Type 2  diabetes.    Coronary artery disease with mild non-obstructive plaque Identified via coronary calcium  score. No obstructive disease noted. Initiation of Crestor  recommended to manage cholesterol and reduce risk of heart attack or stroke. Long-term medication use may be necessary. - Start Crestor  10 mg daily as prescribed - Monitor cholesterol levels and repeat coronary calcium  score next year  Vitals Temp: 97.9 F (36.6 C) BP: (!) 92/55 Pulse Rate: 64 SpO2: 100 %   Anthropometric Measurements Height: 5' (1.524 m) Weight: 229 lb (103.9 kg) BMI (Calculated): 44.72 Weight at Last Visit: 232 lb Weight Lost Since Last Visit: 3 lb Weight Gained Since Last Visit: 0 Starting Weight: 256 lb Total Weight Loss (lbs): 27 lb (12.2 kg) Peak Weight: 268 lb   Body Composition  Body Fat %: 45.3 % Fat Mass (lbs): 104 lbs Muscle Mass (lbs): 119.4 lbs Total Body Water  (lbs): 79.6 lbs Visceral Fat Rating : 16   Other Clinical Data Fasting: no Labs: no Today's Visit #: 75 Starting Date: 06/24/19     ASSESSMENT AND PLAN:  Diet: April [name] is currently in the action stage of change. As such, her goal is to continue with weight loss efforts. She has agreed to keeping a food journal and adhering to recommended goals of 1200 calories and 85+ grams of protein.  Exercise: April [name] has been instructed to work up to a goal of 150 minutes of combined cardio and strengthening exercise per week and to continue exercising as is for weight loss and overall health benefits.   Behavior Modification:  We discussed the following Behavioral Modification Strategies today: increasing lean protein intake, decreasing simple carbohydrates, increasing vegetables, increase H2O intake, no skipping meals, meal planning and cooking strategies, avoiding temptations, and planning for success. We discussed various medication options to help April [name] with her weight loss efforts and we both agreed to continue current  treatment plans.  Return in about 4 weeks (around 06/21/2024).April [name] She was informed of the importance of frequent follow up visits to maximize her success with intensive lifestyle modifications for her multiple health conditions.  Attestation Statements:   Reviewed by clinician on day of visit: allergies, medications, problem list, medical history, surgical history, family history, social history, and previous encounter notes.   Time spent on visit including pre-visit chart review and post-visit care and charting was 36 minutes.    Scarlet Abad, PA-C

## 2024-05-24 ENCOUNTER — Ambulatory Visit (INDEPENDENT_AMBULATORY_CARE_PROVIDER_SITE_OTHER): Admitting: Physician Assistant

## 2024-05-24 ENCOUNTER — Encounter (INDEPENDENT_AMBULATORY_CARE_PROVIDER_SITE_OTHER): Payer: Self-pay | Admitting: Physician Assistant

## 2024-05-24 VITALS — BP 92/55 | HR 64 | Temp 97.9°F | Ht 60.0 in | Wt 229.0 lb

## 2024-05-24 DIAGNOSIS — E559 Vitamin D deficiency, unspecified: Secondary | ICD-10-CM

## 2024-05-24 DIAGNOSIS — E669 Obesity, unspecified: Secondary | ICD-10-CM

## 2024-05-24 DIAGNOSIS — R7303 Prediabetes: Secondary | ICD-10-CM

## 2024-05-24 DIAGNOSIS — K439 Ventral hernia without obstruction or gangrene: Secondary | ICD-10-CM

## 2024-05-24 DIAGNOSIS — R632 Polyphagia: Secondary | ICD-10-CM | POA: Diagnosis not present

## 2024-05-24 DIAGNOSIS — I2583 Coronary atherosclerosis due to lipid rich plaque: Secondary | ICD-10-CM

## 2024-05-24 DIAGNOSIS — Z6841 Body Mass Index (BMI) 40.0 and over, adult: Secondary | ICD-10-CM

## 2024-05-24 DIAGNOSIS — I2510 Atherosclerotic heart disease of native coronary artery without angina pectoris: Secondary | ICD-10-CM

## 2024-05-24 DIAGNOSIS — I10 Essential (primary) hypertension: Secondary | ICD-10-CM
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
## 2024-05-30 ENCOUNTER — Telehealth: Payer: Self-pay | Admitting: Cardiology

## 2024-05-30 NOTE — Telephone Encounter (Signed)
 Spoke with pt and advised for her to stop taking Rosuvastatin  (Crestor ) since she is having those symptoms. Explained that Dr. [place] is not in the office this week and that we will send this information to him and his nurse to review for when he is back in the office. Explained that as soon as we get recommendations on next steps, we will call her to let her know. Pt verbalized understanding of plan and had no further questions at this time.

## 2024-05-30 NOTE — Telephone Encounter (Signed)
 Pt c/o medication issue:  1. Name of Medication:  rosuvastatin  (CRESTOR ) 10 MG tablet   2. How are you currently taking this medication (dosage and times per day)? 1 tablet per day  3. Are you having a reaction (difficulty breathing--STAT)?   4. What is your medication issue? Patient states she is having headaches and achy in her body.

## 2024-05-31 MED ORDER — ROSUVASTATIN CALCIUM 10 MG PO TABS: ORAL_TABLET | ORAL | 3 refills | Status: DC

## 2024-05-31 NOTE — Telephone Encounter (Addendum)
 Spoke to patient Dr.Jordan's advice given.She will take Crestor  10 mg 1/2 tablet  ( 5 mg ) every other day.Advised to call back if she has any symptoms.

## 2024-05-31 NOTE — Addendum Note (Signed)
 Addended by: CHRISTIANNE CHANNING PARAS on: 05/31/2024 10:10 AM   Modules accepted: Orders

## 2024-06-01 ENCOUNTER — Ambulatory Visit (INDEPENDENT_AMBULATORY_CARE_PROVIDER_SITE_OTHER): Admitting: Psychiatry

## 2024-06-01 VITALS — BP 124/72 | HR 68 | Wt 233.0 lb

## 2024-06-01 DIAGNOSIS — F29 Unspecified psychosis not due to a substance or known physiological condition: Secondary | ICD-10-CM

## 2024-06-01 DIAGNOSIS — F1220 Cannabis dependence, uncomplicated: Secondary | ICD-10-CM | POA: Diagnosis not present

## 2024-06-02 NOTE — Telephone Encounter (Signed)
 Pt c/o medication issue:  1. Name of Medication: rosuvastatin  (CRESTOR ) 10 MG tablet   2. How are you currently taking this medication (dosage and times per day)? Take 1/2 tablet ( 5 mg ) every other day   3. Are you having a reaction (difficulty breathing--STAT)? No  4. What is your medication issue? Patient is calling because she told to stop taking this medication therefore she threw it away. We sent a new prescription to the pharmacy, but the patient stated her insurance company will not cover the medication until October. Patient would like to know if we could potentially help with this. Patient has been out of the medication since 05/30/24. Please advise.

## 2024-06-07 MED ORDER — ROSUVASTATIN CALCIUM 5 MG PO TABS: ORAL_TABLET | ORAL | 3 refills | Status: DC

## 2024-06-07 NOTE — Addendum Note (Signed)
 Addended by: CHRISTIANNE CHANNING PARAS on: 06/07/2024 03:56 PM   Modules accepted: Orders

## 2024-06-07 NOTE — Telephone Encounter (Signed)
 Spoke to patient she stated she threw away Rosuvastatin  10 mg tablets and pharmacy will not refill.Spoke to [employer] D at [facility] [facility] she advised send in a new prescription for 5 mg since patient is suppose to take 5 mg every other day.Rosuvastatin  5 mg prescription sent in electronically.

## 2024-06-07 NOTE — Telephone Encounter (Signed)
 Pt calling in about what she can do to get this medication

## 2024-06-09 NOTE — Progress Notes (Addendum)
 COVID Vaccine received:  [x]  No []  Yes Date of any COVID positive Test in last 90 days: no PCP - Dr. Howell @ [facility] Cardiologist - Dr. Peter [place]  Chest x-ray -  EKG -  04/21/24 EPIC Stress Test -  ECHO - 03/19/23 Epic Cardiac Cath -   Bowel Prep - [x]  No  []   Yes ______  Pacemaker / ICD device [x]  No []  Yes   Spinal Cord Stimulator:[x]  No []  Yes       History of Sleep Apnea? [x]  No []  Yes   CPAP used?- [x]  No []  Yes    Does the patient monitor blood sugar?          [x]  No []  Yes  []  N/A  Patient has: []  NO Hx DM   [x]  Pre-DM                 []  DM1  []   DM2 Does patient have a [employer] or Dexacom? [x]  No []  Yes   Fasting Blood Sugar Ranges-  Checks Blood Sugar _____ times a day  GLP1 agonist / usual dose - no GLP1 instructions:  SGLT-2 inhibitors / usual dose - no SGLT-2 instructions:   Blood Thinner / Instructions:no Aspirin Instructions:no  Comments:   Activity level: Patient is able to climb a flight of stairs without difficulty; [x]  No CP  [x]  No SOB,    Patient can perform ADLs without assistance.   Anesthesia review:   Patient denies shortness of breath, fever, cough and chest pain at PAT appointment.  Patient verbalized understanding and agreement to the Pre-Surgical Instructions that were given to them at this PAT appointment. Patient was also educated of the need to review these PAT instructions again prior to his/her surgery.I reviewed the appropriate phone numbers to call if they have any and questions or concerns.

## 2024-06-09 NOTE — Patient Instructions (Addendum)
 SURGICAL WAITING ROOM VISITATION  Patients having surgery or a procedure may have no more than 2 support people in the waiting area - these visitors may rotate.    Children under the age of 17 must have an adult with them who is not the patient.  Visitors with respiratory illnesses are discouraged from visiting and should remain at home.  If the patient needs to stay at the hospital during part of their recovery, the visitor guidelines for inpatient rooms apply. Pre-op nurse will coordinate an appropriate time for 1 support person to accompany patient in pre-op.  This support person may not rotate.    Please refer to the [facility] website for the visitor guidelines for Inpatients (after your surgery is over and you are in a regular room).       Your procedure is scheduled on: 06/23/24   Report to [facility] Main Entrance    Report to admitting at 5:15 AM   Call this number if you have problems the morning of surgery [phone]   Do not eat food :After Midnight.   After Midnight you may have the following liquids until 4:30 AM DAY OF SURGERY  Water  Non-Citrus Juices (without pulp, NO RED-Apple, White grape, White cranberry) Black Coffee (NO MILK/CREAM OR CREAMERS, sugar ok)  Clear Tea (NO MILK/CREAM OR CREAMERS, sugar ok) regular and decaf                             Plain Jell-O (NO RED)                                           Fruit ices (not with fruit pulp, NO RED)                                     Popsicles (NO RED)                                                               Sports drinks like Gatorade (NO RED)              Drink 2 Ensure/G2 drinks AT 10:00 PM the night before surgery.        The day of surgery:  Drink ONE (1) Pre-Surgery Clear Ensure at 4:30 AM the morning of surgery. Drink in one sitting. Do not sip.  This drink was given to you during your hospital  pre-op appointment visit. Nothing else to drink after completing the  Pre-Surgery  Clear Ensure.          If you have questions, please contact your surgeon's office.   FOLLOW BOWEL PREP AND ANY ADDITIONAL PRE OP INSTRUCTIONS YOU RECEIVED FROM YOUR SURGEON'S OFFICE!!!     Oral Hygiene is also important to reduce your risk of infection.                                    Remember - BRUSH YOUR TEETH THE MORNING OF SURGERY WITH  YOUR REGULAR TOOTHPASTE   Stop all vitamins and herbal supplements 7 days before surgery.   Take these medicines the morning of surgery with A SIP OF WATER : Cetirizine (zyrtec ), Gabapentin , Rosuvastatin .             You may not have any metal on your body including hair pins, jewelry, and body piercing             Do not wear make-up, lotions, powders, perfumes/cologne, or deodorant  Do not wear nail polish including gel and S&S, artificial/acrylic nails, or any other type of covering on natural nails including finger and toenails. If you have artificial nails, gel coating, etc. that needs to be removed by a nail salon please have this removed prior to surgery or surgery may need to be canceled/ delayed if the surgeon/ anesthesia feels like they are unable to be safely monitored.   Do not shave  48 hours prior to surgery.    Do not bring valuables to the hospital. [facility] IS NOT             RESPONSIBLE   FOR VALUABLES.   Contacts, glasses, dentures or bridgework may not be worn into surgery.   Bring small overnight bag day of surgery.   DO NOT BRING YOUR HOME MEDICATIONS TO THE HOSPITAL. PHARMACY WILL DISPENSE MEDICATIONS LISTED ON YOUR MEDICATION LIST TO YOU DURING YOUR ADMISSION IN THE HOSPITAL!    Patients discharged on the day of surgery will not be allowed to drive home.  Someone NEEDS to stay with you for the first 24 hours after anesthesia.   Special Instructions: Bring a copy of your healthcare power of attorney and living will documents the day of surgery if you haven't scanned them before.              Please read over the  following fact sheets you were given: IF YOU HAVE QUESTIONS ABOUT YOUR PRE-OP INSTRUCTIONS PLEASE CALL [phone] [place]   If you received a COVID test during your pre-op visit  it is requested that you wear a mask when out in public, stay away from anyone that may not be feeling well and notify your surgeon if you develop symptoms. If you test positive for Covid or have been in contact with anyone that has tested positive in the last 10 days please notify you surgeon.    [place] - Preparing for Surgery Before surgery, you can play an important role.  Because skin is not sterile, your skin needs to be as free of germs as possible.  You can reduce the number of germs on your skin by washing with CHG (chlorahexidine gluconate) soap before surgery.  CHG is an antiseptic cleaner which kills germs and bonds with the skin to continue killing germs even after washing. Please DO NOT use if you have an allergy to CHG or antibacterial soaps.  If your skin becomes reddened/irritated stop using the CHG and inform your nurse when you arrive at Short Stay. Do not shave (including legs and underarms) for at least 48 hours prior to the first CHG shower.  You may shave your face/neck.  Please follow these instructions carefully:  1.  Shower with CHG Soap the night before surgery and the  morning of surgery.  2.  If you choose to wash your hair, wash your hair first as usual with your normal  shampoo.  3.  After you shampoo, rinse your hair and body thoroughly to remove the shampoo.  4.  Use CHG as you would any other liquid soap.  You can apply chg directly to the skin and wash.  Gently with a scrungie or clean washcloth.  5.  Apply the CHG Soap to your body ONLY FROM THE NECK DOWN.   Do   not use on face/ open                           Wound or open sores. Avoid contact with eyes, ears mouth and   genitals (private parts).                       Wash face,  Genitals (private parts) with  your normal soap.             6.  Wash thoroughly, paying special attention to the area where your    surgery  will be performed.  7.  Thoroughly rinse your body with warm water  from the neck down.  8.  DO NOT shower/wash with your normal soap after using and rinsing off the CHG Soap.                9.  Pat yourself dry with a clean towel.            10.  Wear clean pajamas.            11.  Place clean sheets on your bed the night of your first shower and do not  sleep with pets. Day of Surgery : Do not apply any lotions/deodorants the morning of surgery.  Please wear clean clothes to the hospital/surgery center.  FAILURE TO FOLLOW THESE INSTRUCTIONS MAY RESULT IN THE CANCELLATION OF YOUR SURGERY  PATIENT SIGNATURE_________________________________  NURSE SIGNATURE__________________________________  ________________________________________________________________________

## 2024-06-13 ENCOUNTER — Other Ambulatory Visit: Payer: Self-pay

## 2024-06-13 ENCOUNTER — Encounter (HOSPITAL_COMMUNITY)
Admission: RE | Admit: 2024-06-13 | Discharge: 2024-06-13 | Disposition: A | Discharge status: Home / Self Care | Source: Ambulatory Visit | Attending: Surgery | Admitting: Surgery

## 2024-06-13 VITALS — BP 126/81 | HR 77 | Temp 98.1°F | Resp 16 | Ht 60.0 in | Wt 233.0 lb

## 2024-06-13 DIAGNOSIS — I10 Essential (primary) hypertension: Secondary | ICD-10-CM | POA: Insufficient documentation

## 2024-06-13 DIAGNOSIS — Z01812 Encounter for preprocedural laboratory examination: Secondary | ICD-10-CM | POA: Insufficient documentation

## 2024-06-13 DIAGNOSIS — Z01818 Encounter for other preprocedural examination: Secondary | ICD-10-CM

## 2024-06-13 LAB — BASIC METABOLIC PANEL WITH GFR
Anion gap: 11 (ref 5–15)
BUN: 12 mg/dL (ref 8–23)
CO2: 24 mmol/L (ref 22–32)
Calcium: 9.3 mg/dL (ref 8.9–10.3)
Chloride: 105 mmol/L (ref 98–111)
Creatinine, Ser: 0.52 mg/dL (ref 0.44–1.00)
GFR, Estimated: >60 mL/min (ref >60)
Glucose, Bld: 82 mg/dL (ref 70–99)
Potassium: 4.5 mmol/L (ref 3.5–5.1)
Sodium: 140 mmol/L (ref 135–145)

## 2024-06-13 LAB — CBC
HCT: 43.4 % (ref 36.0–46.0)
Hemoglobin: 13.4 g/dL (ref 12.0–15.0)
MCH: 27.4 pg (ref 26.0–34.0)
MCHC: 30.9 g/dL (ref 30.0–36.0)
MCV: 88.8 fL (ref 80.0–100.0)
Platelets: 328 K/uL (ref 150–400)
RBC: 4.89 MIL/uL (ref 3.87–5.11)
RDW: 14.6 % (ref 11.5–15.5)
WBC: 5.6 K/uL (ref 4.0–10.5)
nRBC: 0 % (ref 0.0–0.2)

## 2024-06-16 ENCOUNTER — Ambulatory Visit: Admitting: Student

## 2024-06-21 ENCOUNTER — Ambulatory Visit (INDEPENDENT_AMBULATORY_CARE_PROVIDER_SITE_OTHER): Admitting: Physician Assistant

## 2024-06-21 ENCOUNTER — Encounter (INDEPENDENT_AMBULATORY_CARE_PROVIDER_SITE_OTHER): Payer: Self-pay | Admitting: Physician Assistant

## 2024-06-21 VITALS — BP 144/88 | HR 66 | Temp 98.3°F | Ht 60.0 in | Wt 231.0 lb

## 2024-06-21 DIAGNOSIS — Z6841 Body Mass Index (BMI) 40.0 and over, adult: Secondary | ICD-10-CM | POA: Diagnosis not present

## 2024-06-21 DIAGNOSIS — I2510 Atherosclerotic heart disease of native coronary artery without angina pectoris: Secondary | ICD-10-CM

## 2024-06-21 DIAGNOSIS — I2583 Coronary atherosclerosis due to lipid rich plaque: Secondary | ICD-10-CM | POA: Diagnosis not present

## 2024-06-21 DIAGNOSIS — R7303 Prediabetes: Secondary | ICD-10-CM

## 2024-06-21 DIAGNOSIS — K439 Ventral hernia without obstruction or gangrene: Secondary | ICD-10-CM | POA: Diagnosis not present

## 2024-06-21 DIAGNOSIS — R632 Polyphagia: Secondary | ICD-10-CM

## 2024-06-21 DIAGNOSIS — G4700 Insomnia, unspecified: Secondary | ICD-10-CM

## 2024-06-21 DIAGNOSIS — E669 Obesity, unspecified: Secondary | ICD-10-CM

## 2024-06-21 NOTE — Progress Notes (Signed)
 SUBJECTIVE: Discussed the use of AI scribe software for clinical note transcription with the patient, who gave verbal consent to proceed.  Chief Complaint: Obesity  Interim History: She is up 2 lbs since her last visit.  Down   April [name] is here to discuss her progress with her obesity treatment plan. She is on the Category 2 Plan and keeping a food journal and adhering to recommended goals of 1200 calories and 85 grams of protein and states she is following her eating plan approximately 85 % of the time. She states she is exercising [employer] 120 minutes 2 times per week.  April [name] is a 63 year old female with obesity, prediabetes, and coronary artery disease who presents for follow-up of her obesity treatment plan.  She is scheduled for surgical repair of a Spigellian hernia later this week and is experiencing significant anxiety about the procedure. Stress related to the surgery has been considerable.  There has been a slight increase in weight, attributed to stress eating in anticipation of the surgery. She is aware of her snack calorie intake and has been using Topamax  intermittently to manage her appetite, though a 25 mg dose has not led to significant changes. She also consumes Celsius drinks, which she feels may contribute to snack cravings. To maintain her diet post-surgery, she is preparing meals in advance, such as chicken bone broth soup and stuffed pepper soup. She is attempting to maintain a 1200 calorie diet and reports success on most days. Her protein intake has increased, and she is focusing on hydration and not skipping meals.  She has a history of coronary artery disease with non-obstructive plaque in the LAD, prediabetes, and vitamin D  deficiency. She is currently taking rosuvastatin  every other day to manage cholesterol levels.  Sleep disturbances have been an issue, particularly the night before the visit, attributed to stress. She uses trazodone  for sleep but  dislikes the grogginess it causes upon waking and is trying to manage sleep without medication when possible.  She remains physically active, attending the [facility] and participating in activities at [facility], which she finds beneficial for her physical and mental well-being. Upcoming: Surgery for repair of Spigelian Hernia 06/23/24 with Dr. Sheldon OBJECTIVE: Visit Diagnoses: Problem List Items Addressed This Visit     Insomnia   Polyphagia - Primary   Obesity (HCC)- Start BMI 50.0   Prediabetes   Other Visit Diagnoses       Spigelian hernia         CAD due to lipid rich plaque-mild non obstructive plaque of LAD on CCScore         BMI 45.0-49.9, adult (HCC) Current BMI 45.1          Spigelian hernia Scheduled for surgical repair on Friday per Dr. Sheldon.   Experiencing anxiety related to the procedure. She has prepared meals for post-operative recovery with support from her daughter who will be staying with her post op. -  scheduled surgical repair on 9/12. - Ensure post-operative support and meal preparation. - Follow pre-operative instructions including dietary protocols. Experiencing stress and anxiety contributing to increased snack cravings and insomnia. Reassured about the surgical procedure and the expertise of the surgical team. - Provided reassurance regarding the surgical procedure and outcomes.  Obesity with polyphagia  Weight increased slightly, possibly due to stress eating related to upcoming surgery. Topiramate  was previously used to manage snack cravings but was discontinued to assess its impact. She is not sure it helps that much with cravings  and will consider resuming Topiramate  post-surgery. Current dietary intake includes regular food with a focus on protein and calorie control. Maintaining a 1200 calorie diet and regular physical activity. - Monitor weight and dietary intake post-surgery. Encouraged to maintain adequate protein intake post op  - Consider resuming  Topiramate  post-surgery to manage snack cravings. - Continue regular physical activity and dietary management.   Prediabetes Last A1c was 5.4- at goal/ Insulin  15.5- not at goal  Medication(s): Topiramate  25 mg at bedtime   Does not take topiramate  regularly as feels fatigue the following day.     She is not sure if it helps with cravings, but does make her sleep.  Polyphagia:Yes- Some increased snacks lately with increased stress Lab Results  Component Value Date   HGBA1C 5.4 02/24/2024   HGBA1C 5.8 (H) 02/05/2023   HGBA1C 5.5 03/13/2022   HGBA1C 5.6 11/06/2021   HGBA1C 5.5 04/29/2021   Lab Results  Component Value Date   INSULIN  15.5 02/24/2024   INSULIN  6.2 08/11/2023   INSULIN  9.1 02/05/2023   INSULIN  7.3 11/06/2021   INSULIN  3.6 04/07/2021    Plan:  Continue working on nutrition plan to decrease simple carbohydrates, increase lean proteins and exercise to promote weight loss, improve glycemic control and prevent progression to Type 2 diabetes.  We discussed use of topiramate  prn for times when she is feeling more stressed to help with both cravings and sleep.  Will monitor following her upcoming surgery.   Coronary artery disease with non-obstructive plaque of LAD on CCS On rosuvastatin , adjusted to every other day dosing.  - Continue rosuvastatin  5 mg every other day.   Insomnia Possibly exacerbated by stress related to upcoming surgery. Using trazodone  for sleep but experiences grogginess upon waking. Attempting to manage sleep without medication when possible. - Monitor sleep patterns post-surgery. Vitals Temp: 98.3 F (36.8 C) BP: (!) 144/88 Pulse Rate: 66 SpO2: 99 %   Anthropometric Measurements Height: 5' (1.524 m) Weight: 231 lb (104.8 kg) BMI (Calculated): 45.11 Weight at Last Visit: 229 lb Weight Lost Since Last Visit: 0 Weight Gained Since Last Visit: 2 lb Starting Weight: 256 lb Total Weight Loss (lbs): 25 lb (11.3 kg) Peak Weight: 268  lb   Body Composition  Body Fat %: 45.1 % Fat Mass (lbs): 104.6 lbs Muscle Mass (lbs): 120.8 lbs Total Body Water  (lbs): 81.8 lbs Visceral Fat Rating : 16   Other Clinical Data Fasting: No Labs: No Today's Visit #: 57 Starting Date: 06/24/19     ASSESSMENT AND PLAN:  Diet: Maricsa is currently in the action stage of change. As such, her goal is to continue with weight loss efforts. She has agreed to Category 2 Plan and keeping a food journal and adhering to recommended goals of 1200 calories and 85+grams of protein.  Exercise: Erna has been instructed to continue exercising as is and follow surgeon's instructions post op for weight loss and overall health benefits.   Behavior Modification:  We discussed the following Behavioral Modification Strategies today: increasing lean protein intake, decreasing simple carbohydrates, increasing vegetables, increase H2O intake, increase high fiber foods, meal planning and cooking strategies, avoiding temptations, and planning for success. We discussed various medication options to help Assunta with her weight loss efforts and we both agreed to continue current treatment plan.  Return in about 4 weeks (around 07/19/2024).SABRA She was informed of the importance of frequent follow up visits to maximize her success with intensive lifestyle modifications for her multiple health conditions.  Attestation  Statements:   Reviewed by clinician on day of visit: allergies, medications, problem list, medical history, surgical history, family history, social history, and previous encounter notes.   Time spent on visit including pre-visit chart review and post-visit care and charting was 35 minutes.    Yanil Dawe, PA-C

## 2024-06-22 ENCOUNTER — Ambulatory Visit (INDEPENDENT_AMBULATORY_CARE_PROVIDER_SITE_OTHER): Admitting: Student

## 2024-06-22 ENCOUNTER — Encounter: Payer: Self-pay | Admitting: Student

## 2024-06-22 VITALS — BP 131/83 | HR 69 | Temp 97.8°F | Ht 60.0 in | Wt 235.2 lb

## 2024-06-22 DIAGNOSIS — J3489 Other specified disorders of nose and nasal sinuses: Secondary | ICD-10-CM

## 2024-06-22 DIAGNOSIS — Z23 Encounter for immunization: Secondary | ICD-10-CM | POA: Diagnosis not present

## 2024-06-22 NOTE — Patient Instructions (Addendum)
 It was great to see you! Thank you for allowing me to participate in your care!   I recommend that you always bring your medications to each appointment as this makes it easy to ensure we are on the correct medications and helps us  not miss when refills are needed.  Our plans for today:  - If you experience congestion again, you can try Flonase over the counter nasal spray  Take care and seek immediate care sooner if you develop any concerns. Please remember to show up 15 minutes before your scheduled appointment time!  Gladis Church, DO [facility]

## 2024-06-22 NOTE — Progress Notes (Signed)
    SUBJECTIVE:   CHIEF COMPLAINT / HPI:   Sinus pressure Sinus pressure last week, now resolved.  Wanted to be checked out to make sure there is no infection.  She is not currently having any pain, congestion or systemic symptoms.  She has a hernia repair scheduled for tomorrow, and is overall feeling well. She reports some allergic symptoms around this time of year.   OBJECTIVE:   BP 131/83   Pulse 69   Temp 97.8 F (36.6 C)   Ht 5' (1.524 m)   Wt 235 lb 3.2 oz (106.7 kg)   SpO2 97%   BMI 45.93 kg/m    General: NAD, pleasant HEENT: Normocephalic, atraumatic head. Normal external ear, canal, TM bilaterally. EOM intact and normal conjunctiva BL. Normal external nose. Throat not erythematous, no exudate, no deviation. Cardio: RRR, no MRG. Respiratory: CTAB, normal wob on RA Skin: Warm and dry  ASSESSMENT/PLAN:   Assessment & Plan Sinus pressure Resolved.  Some symptoms of allergic rhinitis. - OTC Flonase as needed - Continue OTC Zyrtec  - Follow-up if symptoms return Encounter for immunization -Flu shot today   Gladis Church, DO [facility]

## 2024-06-22 NOTE — Anesthesia Preprocedure Evaluation (Signed)
 Anesthesia Evaluation  Patient identified by MRN, date of birth, ID band Patient awake    Reviewed: Allergy & Precautions, NPO status , Patient's Chart, lab work & pertinent test results  History of Anesthesia Complications Negative for: history of anesthetic complications  Airway Mallampati: III  TM Distance: >3 FB Neck ROM: Full    Dental  (+) Dental Advisory Given   Pulmonary Current Smoker and Patient abstained from smoking.   Pulmonary exam normal        Cardiovascular hypertension, Normal cardiovascular exam     Neuro/Psych  Headaches PSYCHIATRIC DISORDERS Anxiety Depression       GI/Hepatic ,GERD  Controlled,,(+)     substance abuse  marijuana use  Endo/Other    Class 3 obesity  Renal/GU negative Renal ROS     Musculoskeletal  (+) Arthritis ,    Abdominal  (+) + obese  Peds  Hematology negative hematology ROS (+)   Anesthesia Other Findings Chronic pain   Reproductive/Obstetrics                              Anesthesia Physical Anesthesia Plan  ASA: 3  Anesthesia Plan: General   Post-op Pain Management: Tylenol  PO (pre-op)*, Ketamine  IV* and Precedex    Induction: Intravenous  PONV Risk Score and Plan: 2 and Treatment may vary due to age or medical condition, Ondansetron , Dexamethasone , Midazolam  and Scopolamine  patch - Pre-op  Airway Management Planned: Oral ETT  Additional Equipment: None  Intra-op Plan:   Post-operative Plan: Extubation in OR  Informed Consent: I have reviewed the patients History and Physical, chart, labs and discussed the procedure including the risks, benefits and alternatives for the proposed anesthesia with the patient or authorized representative who has indicated his/her understanding and acceptance.     Dental advisory given  Plan Discussed with: CRNA and Anesthesiologist  Anesthesia Plan Comments:          Anesthesia Quick  Evaluation

## 2024-06-23 ENCOUNTER — Ambulatory Visit (HOSPITAL_COMMUNITY)
Admission: RE | Admit: 2024-06-23 | Discharge: 2024-06-23 | Disposition: A | Discharge status: Home / Self Care | Attending: Surgery | Admitting: Surgery

## 2024-06-23 ENCOUNTER — Other Ambulatory Visit: Payer: Self-pay

## 2024-06-23 ENCOUNTER — Encounter (HOSPITAL_COMMUNITY): Payer: Self-pay | Admitting: Surgery

## 2024-06-23 ENCOUNTER — Ambulatory Visit (HOSPITAL_BASED_OUTPATIENT_CLINIC_OR_DEPARTMENT_OTHER): Payer: Self-pay | Admitting: Anesthesiology

## 2024-06-23 ENCOUNTER — Encounter (HOSPITAL_COMMUNITY): Payer: Self-pay | Admitting: Anesthesiology

## 2024-06-23 ENCOUNTER — Encounter (HOSPITAL_COMMUNITY)
Admission: RE | Disposition: A | Discharge status: Home / Self Care | Payer: Self-pay | Source: Home / Self Care | Attending: Surgery

## 2024-06-23 DIAGNOSIS — K4190 Unilateral femoral hernia, without obstruction or gangrene, not specified as recurrent: Secondary | ICD-10-CM | POA: Insufficient documentation

## 2024-06-23 DIAGNOSIS — Z6841 Body Mass Index (BMI) 40.0 and over, adult: Secondary | ICD-10-CM

## 2024-06-23 DIAGNOSIS — Z87891 Personal history of nicotine dependence: Secondary | ICD-10-CM | POA: Diagnosis not present

## 2024-06-23 DIAGNOSIS — I10 Essential (primary) hypertension: Secondary | ICD-10-CM

## 2024-06-23 DIAGNOSIS — E66813 Obesity, class 3: Secondary | ICD-10-CM | POA: Diagnosis not present

## 2024-06-23 DIAGNOSIS — K219 Gastro-esophageal reflux disease without esophagitis: Secondary | ICD-10-CM | POA: Insufficient documentation

## 2024-06-23 DIAGNOSIS — K436 Other and unspecified ventral hernia with obstruction, without gangrene: Secondary | ICD-10-CM | POA: Diagnosis not present

## 2024-06-23 DIAGNOSIS — E785 Hyperlipidemia, unspecified: Secondary | ICD-10-CM | POA: Insufficient documentation

## 2024-06-23 DIAGNOSIS — K460 Unspecified abdominal hernia with obstruction, without gangrene: Secondary | ICD-10-CM | POA: Diagnosis not present

## 2024-06-23 DIAGNOSIS — F1290 Cannabis use, unspecified, uncomplicated: Secondary | ICD-10-CM | POA: Diagnosis not present

## 2024-06-23 HISTORY — PX: XI ROBOTIC ASSISTED VENTRAL HERNIA: SHX6789

## 2024-06-23 SURGERY — REPAIR, HERNIA, VENTRAL, ROBOT-ASSISTED
Anesthesia: General | Site: Abdomen

## 2024-06-23 MED ORDER — FENTANYL CITRATE PF 50 MCG/ML IJ SOSY
PREFILLED_SYRINGE | INTRAMUSCULAR | Status: AC
  Filled 2024-06-23: qty 1

## 2024-06-23 MED ORDER — BUPIVACAINE LIPOSOME 1.3 % IJ SUSP: 20.0000 mL | Freq: Once | INTRAMUSCULAR | Status: DC

## 2024-06-23 MED ORDER — CHLORHEXIDINE GLUCONATE 0.12 % MT SOLN
15.0000 mL | Freq: Once | OROMUCOSAL | Status: AC
  Administered 2024-06-23: 15 mL via OROMUCOSAL

## 2024-06-23 MED ORDER — ACETAMINOPHEN 500 MG PO TABS
1000.0000 mg | ORAL_TABLET | ORAL | Status: DC
Start: 2024-06-23 — End: 2024-06-23

## 2024-06-23 MED ORDER — DEXMEDETOMIDINE HCL IN NACL 80 MCG/20ML IV SOLN
INTRAVENOUS | Status: AC
  Filled 2024-06-23: qty 20

## 2024-06-23 MED ORDER — FENTANYL CITRATE PF 50 MCG/ML IJ SOSY
50.0000 ug | PREFILLED_SYRINGE | Freq: Once | INTRAMUSCULAR | Status: AC
  Administered 2024-06-23: 50 ug via INTRAVENOUS

## 2024-06-23 MED ORDER — CEFAZOLIN SODIUM-DEXTROSE 2-4 GM/100ML-% IV SOLN
2.0000 g | INTRAVENOUS | Status: AC
  Administered 2024-06-23: 2 g via INTRAVENOUS
  Filled 2024-06-23: qty 100

## 2024-06-23 MED ORDER — FENTANYL CITRATE PF 50 MCG/ML IJ SOSY
25.0000 ug | PREFILLED_SYRINGE | INTRAMUSCULAR | Status: DC | PRN
  Administered 2024-06-23: 25 ug via INTRAVENOUS

## 2024-06-23 MED ORDER — SUGAMMADEX SODIUM 200 MG/2ML IV SOLN
INTRAVENOUS | Status: AC
  Filled 2024-06-23: qty 2

## 2024-06-23 MED ORDER — PHENYLEPHRINE HCL (PRESSORS) 10 MG/ML IV SOLN
INTRAVENOUS | Status: DC | PRN
  Administered 2024-06-23: 200 ug via INTRAVENOUS
  Administered 2024-06-23: 80 ug via INTRAVENOUS
  Administered 2024-06-23: 200 ug via INTRAVENOUS

## 2024-06-23 MED ORDER — ENSURE PRE-SURGERY PO LIQD: 296.0000 mL | Freq: Once | ORAL | Status: DC

## 2024-06-23 MED ORDER — PHENYLEPHRINE HCL-NACL 20-0.9 MG/250ML-% IV SOLN
INTRAVENOUS | Status: AC
  Filled 2024-06-23: qty 500

## 2024-06-23 MED ORDER — ENSURE PRE-SURGERY PO LIQD: 592.0000 mL | Freq: Once | ORAL | Status: DC

## 2024-06-23 MED ORDER — SODIUM CHLORIDE (PF) 0.9 % IJ SOLN
INTRAMUSCULAR | Status: AC
  Filled 2024-06-23: qty 20

## 2024-06-23 MED ORDER — KETAMINE HCL 10 MG/ML IJ SOLN
INTRAMUSCULAR | Status: DC | PRN
Start: 2024-06-23 — End: 2024-06-23
  Administered 2024-06-23: 10 mg via INTRAVENOUS
  Administered 2024-06-23: 20 mg via INTRAVENOUS
  Administered 2024-06-23 (×2): 10 mg via INTRAVENOUS

## 2024-06-23 MED ORDER — BUPIVACAINE-EPINEPHRINE (PF) 0.25% -1:200000 IJ SOLN
INTRAMUSCULAR | Status: DC | PRN
  Administered 2024-06-23: 80 mL

## 2024-06-23 MED ORDER — DEXAMETHASONE SODIUM PHOSPHATE 10 MG/ML IJ SOLN
INTRAMUSCULAR | Status: DC | PRN
  Administered 2024-06-23: 10 mg via INTRAVENOUS

## 2024-06-23 MED ORDER — PROPOFOL 10 MG/ML IV BOLUS
INTRAVENOUS | Status: AC
  Filled 2024-06-23: qty 20

## 2024-06-23 MED ORDER — MIDAZOLAM HCL 2 MG/2ML IJ SOLN
INTRAMUSCULAR | Status: AC
  Filled 2024-06-23: qty 2

## 2024-06-23 MED ORDER — ROCURONIUM BROMIDE 10 MG/ML (PF) SYRINGE
PREFILLED_SYRINGE | INTRAVENOUS | Status: AC
  Filled 2024-06-23: qty 10

## 2024-06-23 MED ORDER — GLYCOPYRROLATE 0.2 MG/ML IJ SOLN
INTRAMUSCULAR | Status: DC | PRN
Start: 2024-06-23 — End: 2024-06-23
  Administered 2024-06-23: .1 mg via INTRAVENOUS

## 2024-06-23 MED ORDER — KETAMINE HCL 50 MG/5ML IJ SOSY
PREFILLED_SYRINGE | INTRAMUSCULAR | Status: AC
Start: 2024-06-23 — End: 2024-06-23
  Filled 2024-06-23: qty 5

## 2024-06-23 MED ORDER — ROCURONIUM 10MG/ML (10ML) SYRINGE FOR MEDFUSION PUMP - OPTIME
INTRAVENOUS | Status: DC | PRN
  Administered 2024-06-23 (×3): 50 mg via INTRAVENOUS

## 2024-06-23 MED ORDER — ONDANSETRON HCL 4 MG/2ML IJ SOLN
INTRAMUSCULAR | Status: DC | PRN
  Administered 2024-06-23: 4 mg via INTRAVENOUS

## 2024-06-23 MED ORDER — GABAPENTIN 300 MG PO CAPS
300.0000 mg | ORAL_CAPSULE | ORAL | Status: DC
Start: 2024-06-23 — End: 2024-06-23
  Filled 2024-06-23: qty 1

## 2024-06-23 MED ORDER — CHLORHEXIDINE GLUCONATE CLOTH 2 % EX PADS
6.0000 | MEDICATED_PAD | Freq: Once | CUTANEOUS | Status: DC
Start: 2024-06-23 — End: 2024-06-23

## 2024-06-23 MED ORDER — BUPIVACAINE LIPOSOME 1.3 % IJ SUSP
INTRAMUSCULAR | Status: AC
  Filled 2024-06-23: qty 20

## 2024-06-23 MED ORDER — GLYCOPYRROLATE 0.2 MG/ML IJ SOLN
INTRAMUSCULAR | Status: AC
  Filled 2024-06-23: qty 1

## 2024-06-23 MED ORDER — 0.9 % SODIUM CHLORIDE (POUR BTL) OPTIME
TOPICAL | Status: DC | PRN
  Administered 2024-06-23: 1000 mL

## 2024-06-23 MED ORDER — AMISULPRIDE (ANTIEMETIC) 5 MG/2ML IV SOLN: 10.0000 mg | Freq: Once | INTRAVENOUS | Status: DC | PRN

## 2024-06-23 MED ORDER — ACETAMINOPHEN 500 MG PO TABS
1000.0000 mg | ORAL_TABLET | Freq: Once | ORAL | Status: AC
  Administered 2024-06-23: 1000 mg via ORAL
  Filled 2024-06-23: qty 2

## 2024-06-23 MED ORDER — ONDANSETRON HCL 4 MG/2ML IJ SOLN
INTRAMUSCULAR | Status: AC
Start: 2024-06-23 — End: 2024-06-23
  Filled 2024-06-23: qty 2

## 2024-06-23 MED ORDER — SODIUM CHLORIDE 0.9 % IV SOLN: 12.5000 mg | INTRAVENOUS | Status: DC | PRN

## 2024-06-23 MED ORDER — EPHEDRINE SULFATE (PRESSORS) 50 MG/ML IJ SOLN
INTRAMUSCULAR | Status: DC | PRN
  Administered 2024-06-23 (×2): 10 mg via INTRAVENOUS
  Administered 2024-06-23: 5 mg via INTRAVENOUS

## 2024-06-23 MED ORDER — DEXAMETHASONE SODIUM PHOSPHATE 10 MG/ML IJ SOLN
INTRAMUSCULAR | Status: AC
  Filled 2024-06-23: qty 1

## 2024-06-23 MED ORDER — BUPIVACAINE-EPINEPHRINE (PF) 0.25% -1:200000 IJ SOLN
INTRAMUSCULAR | Status: AC
  Filled 2024-06-23: qty 60

## 2024-06-23 MED ORDER — PHENYLEPHRINE HCL-NACL 20-0.9 MG/250ML-% IV SOLN
INTRAVENOUS | Status: DC | PRN
  Administered 2024-06-23: 50 ug/min via INTRAVENOUS

## 2024-06-23 MED ORDER — LIDOCAINE 2% (20 MG/ML) 5 ML SYRINGE
INTRAMUSCULAR | Status: DC | PRN
  Administered 2024-06-23: 60 mg via INTRAVENOUS

## 2024-06-23 MED ORDER — EPHEDRINE 5 MG/ML INJ
INTRAVENOUS | Status: AC
  Filled 2024-06-23: qty 5

## 2024-06-23 MED ORDER — FENTANYL CITRATE (PF) 250 MCG/5ML IJ SOLN
INTRAMUSCULAR | Status: DC | PRN
  Administered 2024-06-23 (×5): 50 ug via INTRAVENOUS

## 2024-06-23 MED ORDER — PROPOFOL 10 MG/ML IV BOLUS
INTRAVENOUS | Status: DC | PRN
  Administered 2024-06-23: 140 mg via INTRAVENOUS

## 2024-06-23 MED ORDER — MIDAZOLAM HCL 2 MG/2ML IJ SOLN
INTRAMUSCULAR | Status: DC | PRN
  Administered 2024-06-23: 2 mg via INTRAVENOUS

## 2024-06-23 MED ORDER — CYCLOBENZAPRINE HCL 5 MG PO TABS: 5.0000 mg | ORAL_TABLET | Freq: Three times a day (TID) | ORAL | 2 refills | Status: DC | PRN

## 2024-06-23 MED ORDER — SCOPOLAMINE 1 MG/3DAYS TD PT72
1.0000 | MEDICATED_PATCH | TRANSDERMAL | Status: DC
  Administered 2024-06-23: 1 mg via TRANSDERMAL
  Filled 2024-06-23: qty 1

## 2024-06-23 MED ORDER — FENTANYL CITRATE (PF) 250 MCG/5ML IJ SOLN
INTRAMUSCULAR | Status: AC
  Filled 2024-06-23: qty 5

## 2024-06-23 MED ORDER — BUPIVACAINE-EPINEPHRINE (PF) 0.25% -1:200000 IJ SOLN: INTRAMUSCULAR | Status: DC | PRN

## 2024-06-23 MED ORDER — HYDROMORPHONE HCL 2 MG/ML IJ SOLN
INTRAMUSCULAR | Status: AC
  Filled 2024-06-23: qty 1

## 2024-06-23 MED ORDER — LIDOCAINE HCL (PF) 2 % IJ SOLN
INTRAMUSCULAR | Status: AC
  Filled 2024-06-23: qty 5

## 2024-06-23 MED ORDER — DEXMEDETOMIDINE HCL IN NACL 80 MCG/20ML IV SOLN
INTRAVENOUS | Status: DC | PRN
Start: 2024-06-23 — End: 2024-06-23
  Administered 2024-06-23 (×2): 10 ug via INTRAVENOUS

## 2024-06-23 MED ORDER — HYDROCODONE-ACETAMINOPHEN 5-325 MG PO TABS: 1.0000 | ORAL_TABLET | Freq: Four times a day (QID) | ORAL | 0 refills | Status: DC | PRN

## 2024-06-23 MED ORDER — ORAL CARE MOUTH RINSE: 15.0000 mL | Freq: Once | OROMUCOSAL | Status: AC

## 2024-06-23 MED ORDER — VASOPRESSIN 20 UNIT/ML IV SOLN
INTRAVENOUS | Status: AC
  Filled 2024-06-23: qty 1

## 2024-06-23 MED ORDER — ACETAMINOPHEN 10 MG/ML IV SOLN
INTRAVENOUS | Status: AC
  Filled 2024-06-23: qty 100

## 2024-06-23 MED ORDER — SUGAMMADEX SODIUM 200 MG/2ML IV SOLN
INTRAVENOUS | Status: DC | PRN
  Administered 2024-06-23: 220 mg via INTRAVENOUS

## 2024-06-23 MED ORDER — LACTATED RINGERS IV SOLN
INTRAVENOUS | Status: DC
Start: 2024-06-23 — End: 2024-06-23

## 2024-06-23 MED ORDER — HYDROMORPHONE HCL 1 MG/ML IJ SOLN
INTRAMUSCULAR | Status: DC | PRN
  Administered 2024-06-23: 1 mg via INTRAVENOUS

## 2024-06-23 SURGICAL SUPPLY — 60 items
APPLICATOR COTTON TIP 6 STRL (MISCELLANEOUS) ×2 IMPLANT
BAG COUNTER SPONGE SURGICOUNT (BAG) ×2 IMPLANT
BLADE SURG SZ11 CARB STEEL (BLADE) ×2 IMPLANT
CHLORAPREP W/TINT 26 (MISCELLANEOUS) ×2 IMPLANT
CLIP APPLIE 5 13 M/L LIGAMAX5 (MISCELLANEOUS) IMPLANT
COVER SURGICAL LIGHT HANDLE (MISCELLANEOUS) ×2 IMPLANT
COVER TIP SHEARS 8 DVNC (MISCELLANEOUS) IMPLANT
DEFOGGER SCOPE WARM SEASHARP (MISCELLANEOUS) IMPLANT
DRAIN CHANNEL 19F RND (DRAIN) IMPLANT
DRAPE ARM DVNC X/XI (DISPOSABLE) ×8 IMPLANT
DRAPE COLUMN DVNC XI (DISPOSABLE) ×2 IMPLANT
DRIVER NDL LRG 8 DVNC XI (INSTRUMENTS) IMPLANT
DRIVER NDL MEGA SUTCUT DVNCXI (INSTRUMENTS) IMPLANT
DRIVER NDLE LRG 8 DVNC XI (INSTRUMENTS) IMPLANT
DRIVER NDLE MEGA SUTCUT DVNCXI (INSTRUMENTS) ×1 IMPLANT
DRSG TEGADERM 2-3/8X2-3/4 SM (GAUZE/BANDAGES/DRESSINGS) ×10 IMPLANT
ELECT REM PT RETURN 15FT ADLT (MISCELLANEOUS) ×2 IMPLANT
EVACUATOR DRAINAGE 10X20 100CC (DRAIN) IMPLANT
EVACUATOR SILICONE 100CC (DRAIN) IMPLANT
FORCEPS PROGRASP DVNC XI (FORCEP) IMPLANT
GAUZE SPONGE 2X2 8PLY STRL LF (GAUZE/BANDAGES/DRESSINGS) ×2 IMPLANT
GLOVE ECLIPSE 8.0 STRL XLNG CF (GLOVE) ×4 IMPLANT
GLOVE INDICATOR 8.0 STRL GRN (GLOVE) ×4 IMPLANT
GOWN STRL REUS W/ TWL XL LVL3 (GOWN DISPOSABLE) ×4 IMPLANT
GRASPER SUT TROCAR 14GX15 (MISCELLANEOUS) IMPLANT
GRASPER TIP-UP FEN DVNC XI (INSTRUMENTS) IMPLANT
IRRIGATION SUCT STRKRFLW 2 WTP (MISCELLANEOUS) IMPLANT
KIT BASIN OR (CUSTOM PROCEDURE TRAY) ×2 IMPLANT
KIT TURNOVER KIT A (KITS) ×2 IMPLANT
MESH HERNIA 6X6 BARD (Mesh General) IMPLANT
MESH VENTRALIGHT ST 8X10 (Mesh General) IMPLANT
NDL HYPO 22X1.5 SAFETY MO (MISCELLANEOUS) ×2 IMPLANT
NDL SPNL 22GX3.5 QUINCKE BK (NEEDLE) IMPLANT
NEEDLE HYPO 22X1.5 SAFETY MO (MISCELLANEOUS) ×1 IMPLANT
NEEDLE SPNL 22GX3.5 QUINCKE BK (NEEDLE) ×1 IMPLANT
PACK CARDIOVASCULAR III (CUSTOM PROCEDURE TRAY) ×2 IMPLANT
PAD POSITIONING PINK XL (MISCELLANEOUS) ×2 IMPLANT
PENCIL SMOKE EVACUATOR (MISCELLANEOUS) IMPLANT
SCISSORS LAP 5X45 EPIX DISP (ENDOMECHANICALS) IMPLANT
SCISSORS MNPLR CVD DVNC XI (INSTRUMENTS) IMPLANT
SEAL UNIV 5-12 XI (MISCELLANEOUS) ×8 IMPLANT
SEALER VESSEL EXT DVNC XI (MISCELLANEOUS) ×2 IMPLANT
SOLUTION ELECTROSURG ANTI STCK (MISCELLANEOUS) ×2 IMPLANT
SPIKE FLUID TRANSFER (MISCELLANEOUS) ×2 IMPLANT
SUT ETHIBOND 0 36 GRN (SUTURE) IMPLANT
SUT ETHIBOND NAB CT1 #1 30IN (SUTURE) IMPLANT
SUT MNCRL AB 4-0 PS2 18 (SUTURE) ×2 IMPLANT
SUT PDS AB 1 CT1 27 (SUTURE) IMPLANT
SUT PROLENE 2 0 SH DA (SUTURE) IMPLANT
SUT STRATA PDS 2-0 23 CT-1 (SUTURE) IMPLANT
SUT VIC AB 2-0 SH 27X BRD (SUTURE) IMPLANT
SUT VICRYL 0 UR6 27IN ABS (SUTURE) IMPLANT
SUTURE STRTFX SPRL PDS+ 2-0 23 (SUTURE) IMPLANT
SYR 10ML LL (SYRINGE) ×2 IMPLANT
SYR 20ML LL LF (SYRINGE) ×2 IMPLANT
TOWEL OR 17X26 10 PK STRL BLUE (TOWEL DISPOSABLE) ×2 IMPLANT
TRAY FOLEY MTR SLVR 14FR STAT (SET/KITS/TRAYS/PACK) IMPLANT
TRAY FOLEY MTR SLVR 16FR STAT (SET/KITS/TRAYS/PACK) IMPLANT
TROCAR ADV FIXATION 5X100MM (TROCAR) IMPLANT
TUBING INSUFFLATION 10FT LAP (TUBING) ×2 IMPLANT

## 2024-06-23 NOTE — Anesthesia Procedure Notes (Signed)
 Procedure Name: Intubation Date/Time: 06/23/2024 7:38 AM  Performed by: Nanci Riis, CRNAPre-anesthesia Checklist: Patient identified, Emergency Drugs available, Suction available, Patient being monitored and Timeout performed Patient Re-evaluated:Patient Re-evaluated prior to induction Oxygen Delivery Method: Circle system utilized Preoxygenation: Pre-oxygenation with 100% oxygen Induction Type: IV induction Ventilation: Mask ventilation without difficulty Laryngoscope Size: Miller and 3 Grade View: Grade II Tube type: Oral Tube size: 7.0 mm Number of attempts: 1 Airway Equipment and Method: Stylet Placement Confirmation: ETT inserted through vocal cords under direct vision, positive ETCO2 and breath sounds checked- equal and bilateral Secured at: 22 cm Tube secured with: Tape Dental Injury: Teeth and Oropharynx as per pre-operative assessment

## 2024-06-23 NOTE — Anesthesia Postprocedure Evaluation (Signed)
 Anesthesia Post Note  Patient: April [name]  Procedure(s) Performed: REPAIR, HERNIA, VENTRAL, ROBOT-ASSISTED (Abdomen)     Patient location during evaluation: PACU Anesthesia Type: General Level of consciousness: awake and alert Pain management: pain level controlled Vital Signs Assessment: post-procedure vital signs reviewed and stable Respiratory status: spontaneous breathing, nonlabored ventilation and respiratory function stable Cardiovascular status: stable and blood pressure returned to baseline Anesthetic complications: no   No notable events documented.  Last Vitals:  Vitals:   06/23/24 1145 06/23/24 1200  BP: (!) 119/56 (!) 125/54  Pulse: 85 94  Resp: 20 16  Temp:    SpO2: 99% 93%                  Debby FORBES Like
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
## 2024-06-23 NOTE — Op Note (Signed)
 06/23/2024  PATIENT:  April [name]  63 y.o. female  Patient Care Team: Howell Lunger, DO as PCP - General (Family Medicine) Ward, Sharlet MATSU, [facility] as Pharmacist (Pharmacist) Sheldon Standing, MD as Consulting Physician (General Surgery) Federico Rosario BROCKS, MD as Consulting Physician (Gastroenterology)  PRE-OPERATIVE DIAGNOSIS:  VENTRAL SPIGELIAN INCARCERATED ABOMINAL WALL HERNIA  POST-OPERATIVE DIAGNOSIS:   VENTRAL SPIGELIAN INCARCERATED ABOMINAL WALL HERNIA RIGHT FEMORAL HERNIA  PROCEDURE:   ROBOTIC REPAIR OF  INCARCERATED ABDOMINAL WALL HERNIA WITH MESH ROBOTIC RIGHT FEMORAL HERNIA REPAIR WITH MESH TAP BLOCK - BILATERAL  SURGEON:  Standing KYM Sheldon, MD  ASSISTANT:  (n/a)    ANESTHESIA:  General endotracheal intubation anesthesia (GETA) and Regional TRANSVERSUS ABDOMINIS PLANE (TAP) nerve block -BILATERAL for perioperative & postoperative pain control at the level of the transverse abdominis & preperitoneal spaces along the flank at the anterior axillary line, from subcostal ridge to iliac crest under laparoscopic guidance provided with liposomal bupivacaine  (Experel) 20mL mixed with 60 mL of bupivicaine 0.25% with epinephrine   Estimated Blood Loss (EBL):   Total I/O In: 1300 [I.V.:1300] Out: 50 [Blood:50].   (See anesthesia record)  Delay start of Pharmacological VTE agent (>24hrs) due to concerns of significant anemia, surgical blood loss, or risk of bleeding?:  no  DRAINS: (None)  SPECIMEN:  (no specimen)  DISPOSITION OF SPECIMEN:  (not applicable)  COUNTS:  Sponge, needle, & instrument counts CORRECT at the conclusion of the case.      PLAN OF CARE: Discharge to home after PACU  PATIENT DISPOSITION:  PACU - hemodynamically stable.  INDICATION: Pleasant patient has developed a ventral wall abdominal hernia.  Left lower quadrant suspicious for spigelian type hernia containing colon with worsening pain.  Recommendation was made for surgical repair  The anatomy &  physiology of the abdominal wall was discussed. The pathophysiology of hernias was discussed. Natural history risks without surgery including progeressive enlargement, pain, incarceration & strangulation was discussed. Contributors to complications such as smoking, obesity, diabetes, prior surgery, etc were discussed.  I feel the risks of no intervention will lead to serious problems that outweigh the operative risks; therefore, I recommended surgery to reduce and repair the hernia. I explained laparoscopic techniques with possible need for an open approach. I noted the probable use of mesh to patch and/or buttress the hernia repair.  Risks such as bleeding, infection, abscess, need for further treatment, heart attack, death, and other risks were discussed. I noted a good likelihood this will help address the problem. Goals of post-operative recovery were discussed as well. Possibility that this will not correct all symptoms was explained. I stressed the importance of low-impact activity, aggressive pain control, avoiding constipation, & not pushing through pain to minimize risk of post-operative chronic pain or injury. Possibility of reherniation especially with smoking, obesity, diabetes, immunosuppression, and other health conditions was discussed. We will work to minimize complications.   An educational handout further explaining the pathology & treatment options was given as well. Questions were answered. The patient expresses understanding & wishes to proceed with surgery.   OR FINDINGS: Left lower quadrant spigelian type hernia 7 x 6 cm region chronically incarcerated with about a foot sigmoid colon and omentum and a very dense hernia sac in the left panniculus.  Mild laxity in left direct space and femoral canal but no definite hernia.  No indirect inguinal hernia or obturator hernia.  On the right side small but definite femoral hernia.  No obvious direct space inguinal spigelian obturator  hernias on  the right.  Type of ventral wall repair:  Robotic underlay repair .with Primary repair of largest hernia Placement of mesh: Retrorectus / Preperitoneal underlay repair Name of mesh: Bard Ventralight dual sided (polypropylene / Seprafilm) Size of mesh: 25x20cm Orientation: Transverse Mesh overlap:  7cm    DESCRIPTION:   Informed consent was confirmed.  The patient underwent general anaesthesia without difficulty.  The patient was positioned appropriately.  VTE prevention in place.  The patient's abdomen was clipped, prepped, & draped in a sterile fashion.  Surgical timeout confirmed our plan.  Peritoneal entry with a laparoscopic port was obtained using Varess spring needle entry technique in the left upper abdomen as the patient was positioned in reverse Trendelenburg.  I induced carbon dioxide insufflation.  No change in end tidal CO2 measurements.  Full symmetrical abdominal distention.  Initial port was carefully placed.  Camera inspection revealed no injury.  Extra ports were carefully placed under direct laparoscopic visualization.  I focused attention on the lower abdomen.  Could see swath omentum and colon going up in the left lower quadrant hernia chronically incarcerated that seems suspicious for spigelian type hernia since there was no incision nearby.  I used focused sharp dissection and cautery to try and free the peritoneal covering circumferentially around the hernia sac but was clearly very incarcerated.  Decided to do a supraumbilical Tapp like approach to try and get the hernia sac down from the peritoneal side.  Came through the retrorectus and preperitoneal space slightly supraumbilically from the right anterior axillary line into the left.  Came in peritoneum and freed that off.  She had a mild diastases recti.  Focused on the left side.  Worked to free that down.  Came around the hernia sac to try and mobilize and reduce it down but it was quite apparent that it was  severely incarcerated with omentum and sigmoid colon.  Freed the peritoneum around the edges of the hernia sac circumferentially.  I worked to meticulously reduce out initially omentum and eventually sigmoid colon off of a giant hernia sac in the left panniculus.  Patient had very dense weblike adhesions and a spherical like fashion.  This took quite some time.  90 minutes.  Eventually got greater omentum out and finally got the colon out.  Inspection saw 1 area of thinned out serosa that I repaired on the mid sigmoid colon with a 2-0 Vicryl suture in a figure-of-eight fashion.  Used that to tack a epiploic appendage of the bowel to kind of patch the area.  Inspection rest of the colon looked clear.  I used blunt & focused sharp dissection to free the peritoneum off the flank and down to the pubic rim.  I freed the anteriolateral bladder wall off the anteriolateral pelvic wall, sparing midline attachments.  I freed the peritoneum off the round ligament.  I freed peritoneum off the retroperitoneum along the psoas muscle.  Spermatic cord lipoma was dissected away & removed.  I checked & assured hemostasis.  I turned attention on the opposite  RIGHT pelvis.  I did dissection in a similar, mirror-image fashion. The patient had a femoral hernia.SABRA   Spermatic cord lipoma was dissected away & removed.    I checked & assured hemostasis.     I chose sheets of medium-weight polypropylene Bard Marlex 15x15cm, one for each side.  I cut a single sigmoid-shaped slit ~6cm from a corner of each mesh.  I placed the meshes into the preperitoneal space & laid them as  overlapping diamonds such that at the inferior points, a 6x6 cm corner flap rested in the true anterolateral pelvis, covering the obturator & femoral foramina.   I allowed the bladder to return to the pubis, this helping tuck the corners of the mesh in the anteriolateral pelvis.  The medial corners overlapped each other across midline cephalad to the pubic rim.       I then returned attention to the ventral wall hernia.  I mapped out the region using a needle passer.    The primarily closed the spigelian type hernia defect with 2 oh STRATAFIX initially running and had to transition to interrupted type fashion to get better opposition.  Use the stitches to help tack the left superior Marlex mesh as well to help buttress the closure.  Given her severe morbid obesity with a larger defect, fair tissue quality, inadequate overlap, and to ensure that I would have at least 7 cm radial coverage outside of the hernia defect, I chose a 25x20cm dual sided mesh.  We unrolled the mesh and positioned it appropriately.  Made sure for the inferior edge overlapped with the inguinal region.  Left lateral corner rested in the posterior axillary line in the preperitoneal/retrorectus space to have extra overlap laterally given her severe morbid obesity.  Mesh came across midline well for good overlap.  Then closed the peritoneum using 2-0 STRATAFIX starting the right lateral corner.  I did do 9 inch x 2 sutures.  I used the suture to help secure the Ventralight mesh in the center and left mid abdomen to the anterior abdominal wall and the peritoneal closure as well to have good closure and extra securing of the mesh.  I primarily closed the left lower quadrant peritoneal defect where the spigelian type hernia been using another STRATAFIX suture in a running fashion to good result such that all peritoneal defects were closed and no exposed mesh.  I reinspected the greater omentum and colon and saw no injury or other concerns.  Brought omentum down to cover the peritoneum.  We evacuated CO2 & desufflated the abdomen.  Capnoperitoneum was evacuated. Ports were removed.  The larger 12mm port where the mesh was replaced was on the right lateral subcostal ridge on placement and that fascial defect rolled up above the rib.  Therefore felt there was no need for any more aggressive closure.  The skin  was closed with Monocryl at the port sites and Steri-Strips on the fascial stitch puncture sites.  Patient is being extubated to go to the recovery room.  I discussed operative findings, updated the patient's status, discussed probable steps to recovery, and gave postoperative recommendations to the patient's daughter, Jaquay Morneault.  Recommendations were made.  Questions were answered.  She expressed understanding & appreciation.  Elspeth KYM Schultze, M.D., F.A.C.S. Gastrointestinal and Minimally Invasive Surgery [facility] [address], [address] [place], [place] [address] [phone] Main / Paging  06/23/2024 11:50 AM

## 2024-06-23 NOTE — H&P (Signed)
 06/23/2024    PATIENT NAME: April [name] MRN: [id number] DOB: [birth year] PHYSICIANS:  REFERRING PHYSICIAN: Federico Rosario Stagger, MD  CARE TEAM:  Patient Care Team: Federico Rosario Stagger, MD as PCP - General (Gastroenterology)  CONSULTING PROVIDER: ELSPETH JUDAH SCHULTZE, MD  SUBJECTIVE   Chief Complaint: Hernia   April [name] is a 63 y.o. female  who is seen today as an office consultation  at the request of DrRONITA Federico  for evaluation of hernia.  History of Present Illness:  63 year old man comes today by herself. Was undergoing colonoscopy. Gastrologist cannot get too high up in due to severe twirling and kinking. Came in. She had an abdominal wall mass that raise concern of incarcerated hernia. Patient had postprocedure CAT scan which noted: Going up into a left lower quadrant lower abdominal hernia. 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 Surgical consultation requested.  Patient notes not much abdominal discomfort in the region. She usually moves her bowels on a daily basis. Does not smoke. She does have some right foot issues that limit her ability to walk but does not get any chest pain or shortness of breath after walking 20 minutes. No diabetes or sleep apnea. No cardiac or pulmonary issues that she is aware of. Takes meloxicam  for her chronic pain issues. She had a partial hysterectomy but no other abdominal surgery. No history of UTIs or incontinence or skin infections. Maybe has some glass of wine every other night.  Medical History:  Past Medical History:  Diagnosis Date  Anxiety  Arthritis   Patient Active Problem List  Diagnosis  Incarcerated ventral hernia  BMI 45.0-49.9, adult (CMS/HHS-HCC)   Past Surgical History:  Procedure Laterality Date  HYSTERECTOMY  JOINT REPLACEMENT    Allergies  Allergen Reactions  Oxycodone -Acetaminophen  Swelling  Naproxen  Palpitations   Current Outpatient Medications on File Prior to Visit  Medication Sig Dispense Refill   cetirizine  (ZYRTEC ) 10 MG tablet  diclofenac  (VOLTAREN ) 1 % topical gel APPLY 2 GRAMS TOPICALLY TO THE AFFECTED AREA FOUR TIMES DAILY AS NEEDED FOR PAIN  meloxicam  (MOBIC ) 15 MG tablet Take 15 mg by mouth once daily  topiramate  (TOPAMAX ) 25 MG tablet Take 50 mg by mouth   No current facility-administered medications on file prior to visit.   Family History  Problem Relation Age of Onset  Heart valve disease Mother  Diabetes Mother  Hyperlipidemia (Elevated cholesterol) Father  Diabetes Father    Social History   Tobacco Use  Smoking Status Former  Types: Cigarettes  Smokeless Tobacco Not on file    Social History   Socioeconomic History  Marital status: Single  Tobacco Use  Smoking status: Former  Types: Cigarettes  Substance and Sexual Activity  Alcohol use: Yes  Alcohol/week: 2.0 - 6.0 standard drinks of alcohol  Types: 2 - 6 Standard drinks or equivalent per week  Drug use: Never   Social Drivers of Corporate investment banker Strain: Low Risk (11/13/2022)  Received from [facility]  Overall Financial Resource Strain (CARDIA)  Difficulty of Paying Living Expenses: Not very hard  Food Insecurity: No Food Insecurity (11/13/2022)  Received from [facility]  Hunger Vital Sign  Within the past 12 months, you worried that your food would run out before you got the money to buy more.: Never true  Within the past 12 months, the food you bought just didn't last and you didn't have money to get more.: Never true  Transportation Needs: No Transportation Needs (10/19/2022)  Received from [facility] - Transportation  Lack of Transportation (Medical): No  Lack of Transportation (Non-Medical): No  Physical Activity: Insufficiently Active (10/19/2022)  Received from [facility]  Exercise Vital Sign  On average, how many days per week do you engage in moderate to strenuous exercise (like a brisk walk)?: 2 days  On average, how many minutes do you engage in exercise at  this level?: 60 min  Stress: Stress Concern Present (04/12/2023)  Received from [facility] - Occupational Stress Questionnaire  Feeling of Stress : To some extent  Social Connections: Moderately Isolated (01/11/2023)  Received from [facility]  Social Connection and Isolation Panel  In a typical week, how many times do you talk on the phone with family, friends, or neighbors?: More than three times a week  How often do you get together with friends or relatives?: More than three times a week  How often do you attend church or religious services?: More than 4 times per year  Do you belong to any clubs or organizations such as church groups, unions, fraternal or athletic groups, or school groups?: No  How often do you attend meetings of the clubs or organizations you belong to?: Never  Are you married, widowed, divorced, separated, never married, or living with a partner?: Never married   ############################################################  Review of Systems: A complete review of systems (ROS) was obtained from the patient.  We have reviewed this information and discussed as appropriate with the patient.  See HPI as well for other pertinent ROS.  Constitutional: No fevers, chills, sweats. Weight stable Eyes: No vision changes, No discharge HENT: No sore throats, nasal drainage Lymph: No neck swelling, No bruising easily Pulmonary: No cough, productive sputum CV: No orthopnea, PND . No exertional chest/neck/shoulder/arm pain. Patient can walk 20 minutes gradually.   GI: No personal nor family history of GI/colon cancer, inflammatory bowel disease, irritable bowel syndrome, allergy such as Celiac Sprue, dietary/dairy problems, colitis, ulcers nor gastritis. No recent sick contacts/gastroenteritis. No travel outside the country. No changes in diet.  Renal: No UTIs, No hematuria Genital: No drainage, bleeding, masses Musculoskeletal: No  severe joint pain. Good ROM major joints Skin: No sores or lesions Heme/Lymph: No easy bleeding. No swollen lymph nodes Neuro: No active seizures. No facial droop Psych: No hallucinations. No agitation  OBJECTIVE   Vitals:  05/01/24 0912  BP: (!) 142/82  Pulse: 87  Weight: (!) 105.7 kg (233 lb)  Height: 152.4 cm (5')   Body mass index is 45.5 kg/m.  PHYSICAL EXAM:  Constitutional: Not cachectic. Hygeine adequate. Vitals signs as above.  Eyes: No glasses. Vision adequate,Pupils reactive, normal extraocular movements. Sclera nonicteric Neuro: CN II-XII intact. No major focal sensory defects. No major motor deficits. Lymph: No head/neck/groin lymphadenopathy Psych: No severe agitation. No severe anxiety. Judgment & insight Adequate, Oriented x4, HENT: Normocephalic, Mucus membranes moist. No thrush. Hearing: adequate Neck: Supple, No tracheal deviation. No obvious thyromegaly Chest: No pain to chest wall compression. Good respiratory excursion. No audible wheezing CV: Pulses intact. regular. No major extremity edema Ext: No obvious deformity or contracture. Edema: Not present. No cyanosis Skin: No major subcutaneous nodules. Warm and dry Musculoskeletal: Severe joint rigidity not present. No obvious clubbing. No digital petechiae. Mobility: no assist device moving easily without restrictions  Abdomen: Obese with panniculus Soft. Nondistended. Has asymmetrical swelling in her left lower quadrant and a small panniculus. I can feel a 8 x 8 cm subcutaneous mass that is deep and not reducible but  correlates with her abdominal wall hernia.. . Diastasis recti: NONE. No hepatomegaly. No splenomegaly.  Genital/Pelvic: Inguinal hernia: Not present. Inguinal lymph nodes: without lymphadenopathy nor hidradenitis.   Rectal: (Deferred)    ###################################################################  Labs, Imaging and Diagnostic Testing:  Located in 'Care Everywhere' section of Epic  EMR chart  PRIOR [facility] CLINIC NOTES:  Not applicable  SURGERY NOTES:  Not applicable  PATHOLOGY:  Not applicable  Assessment and Plan:  DIAGNOSES:  Diagnoses and all orders for this visit:  Incarcerated ventral hernia  BMI 45.0-49.9, adult (CMS/HHS-HCC)    ASSESSMENT/PLAN  Pleasant morbidly obese woman with left lower quadrant abdominal wall swelling into panniculus by exam limiting colonoscopy. CAT scan shows sigmoid colon incarcerated and ventral hernia paramedian. Most likely spigelian. A little too high for a inguinal hernia. She has some sensitivity there but is not obstructed at this time. I recommended being aggressive and electively for can just before it becomes a worse problem and is likely in the next few decades. Minimal invasive approach. Given her morbid obesity will try and do a robotic TAPP type approach. This would allow me to make sure there are no inguinal or femoral issues and have good overlap to avoid those from happening as well.  The anatomy & physiology of the abdominal wall and pelvic floor was discussed. The pathophysiology of hernias in the inguinal and pelvic region was discussed. Natural history risks such as progressive enlargement, pain, incarceration, and strangulation was discussed. Contributors to complications such as smoking, obesity, diabetes, prior surgery, etc were discussed.   I feel the risks of no intervention will lead to serious problems that outweigh the operative risks; therefore, I recommended surgery to reduce and repair the hernia. I explained MIS robotic/ laparoscopic techniques with possible need for an open approach. I noted usual use of mesh to patch and/or buttress hernia repair  Risks such as bleeding, infection, abscess, need for further treatment, injury to other organs, need for repair of tissues / organs, stroke, heart attack, death, and other risks were discussed. I noted a good likelihood this will help address the problem.  Goals of post-operative recovery were discussed as well. Possibility that this will not correct all symptoms was explained. I stressed the importance of low-impact activity, aggressive pain control, avoiding constipation, & not pushing through pain to minimize risk of post-operative chronic pain or injury. Possibility of reherniation was discussed. We will work to minimize complications.   An educational handout further explaining the pathology & treatment options was given as well. Questions were answered. The patient expresses understanding & wishes to proceed with surgery.  She did have an episode of chest pain and saw cardiology for this. Dr. [place] evaluated last week. Seem to have decent performance status. CT angiogram showed a low calcium  score which was mostly reassuring. Recommended adding Crestor  hypercholesterol medication and follow-up cholesterol levels in 3 months.  Elspeth KYM Schultze, MD, FACS, MASCRS Esophageal, Gastrointestinal & Colorectal Surgery Robotic and Minimally Invasive Surgery  [facility] [address], [address] [place], [place] [address] [phone] Fax [phone] Main  CONTACT INFORMATION: Weekday (9AM-5PM): Call [facility] main office at [phone] Weeknight (5PM-9AM) or Weekend/Holiday: Check EPIC Web Links tab & use AMION (password  [email or web address]) for General Surgery [facility] coverage  Please, DO NOT use SecureChat  (it is not reliable communication to reach operating surgeons & will lead to a delay in care).   Epic staff messaging available for outptient concerns needing  1-2 business day response.     06/23/2024

## 2024-06-23 NOTE — Transfer of Care (Signed)
 Immediate Anesthesia Transfer of Care Note  Patient: April [name]  Procedure(s) Performed: REPAIR, HERNIA, VENTRAL, ROBOT-ASSISTED (Abdomen)  Patient Location: PACU  Anesthesia Type:General  Level of Consciousness: drowsy and patient cooperative  Airway & Oxygen Therapy: Patient Spontanous Breathing and Patient connected to face mask oxygen  Post-op Assessment: Report given to RN and Post -op Vital signs reviewed and stable  Post vital signs: Reviewed and stable  Last Vitals:  Vitals Value Taken Time  BP 119/57 06/23/24 11:42  Temp    Pulse 83 06/23/24 11:43  Resp 14 06/23/24 11:43  SpO2 99 % 06/23/24 11:43  Vitals shown include unfiled device data.  Last Pain:  Vitals:   06/23/24 0537  TempSrc: Oral         Complications: No notable events documented.

## 2024-06-26 ENCOUNTER — Ambulatory Visit: Admitting: Dermatology

## 2024-06-26 ENCOUNTER — Encounter (HOSPITAL_COMMUNITY): Payer: Self-pay | Admitting: Surgery

## 2024-06-29 ENCOUNTER — Other Ambulatory Visit (HOSPITAL_BASED_OUTPATIENT_CLINIC_OR_DEPARTMENT_OTHER): Payer: Self-pay | Admitting: General Surgery

## 2024-06-29 DIAGNOSIS — R1030 Lower abdominal pain, unspecified: Secondary | ICD-10-CM

## 2024-06-29 DIAGNOSIS — Z8719 Personal history of other diseases of the digestive system: Secondary | ICD-10-CM

## 2024-06-30 ENCOUNTER — Ambulatory Visit (HOSPITAL_BASED_OUTPATIENT_CLINIC_OR_DEPARTMENT_OTHER)
Admission: RE | Admit: 2024-06-30 | Discharge: 2024-06-30 | Disposition: A | Discharge status: Home / Self Care | Source: Ambulatory Visit | Attending: General Surgery | Admitting: General Surgery

## 2024-06-30 ENCOUNTER — Ambulatory Visit: Payer: Self-pay | Admitting: Surgery

## 2024-06-30 DIAGNOSIS — Z9889 Other specified postprocedural states: Secondary | ICD-10-CM | POA: Insufficient documentation

## 2024-06-30 DIAGNOSIS — Z90710 Acquired absence of both cervix and uterus: Secondary | ICD-10-CM | POA: Diagnosis not present

## 2024-06-30 DIAGNOSIS — R1030 Lower abdominal pain, unspecified: Secondary | ICD-10-CM | POA: Diagnosis not present

## 2024-06-30 DIAGNOSIS — Z8719 Personal history of other diseases of the digestive system: Secondary | ICD-10-CM | POA: Insufficient documentation

## 2024-06-30 MED ORDER — IOHEXOL 300 MG/ML  SOLN
100.0000 mL | Freq: Once | INTRAMUSCULAR | Status: AC | PRN
  Administered 2024-06-30: 100 mL via INTRAVENOUS

## 2024-07-04 NOTE — Progress Notes (Signed)
 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  Retrorectus mesh repairs on 06/23/2024  Pathology: Not applicable  Patient returns for early follow-up.  She complains of bloating and came to the office.  Office ordered a CAT scan that showed fluid collections in her abdominal panniculus most likely consistent with seromas.  No evidence of any hernia recurrence.  She was placed on antibiotics as a hedge.  She denies any pain.  She is occasionally using some hydrocodone  just in case.  She is worried she had a rash so we wrote for some nystatin and that is cleared up.  She was given prescription for Augmentin for 10 days.  She is nearly done with that.  No fevers or chills.  She feels like she gets a little bit of bloating.  Usually after she eats.  She has been taking MiraLAX  to avoid getting constipated.      Labs, Imaging and Diagnostic Testing:  Located in 'Care Everywhere' section of Epic EMR chart   PRIOR [facility] CLINIC NOTES:  Located in 'Care Everywhere' section of Epic EMR chart   SURGERY NOTES:  Located in 'Care Everywhere' section of Epic EMR chart   PATHOLOGY:  Located in 'Care Everywhere' section of Epic EMR chart     Physical Examination:   There is no height or weight on file to calculate BMI.   Constitutional: Not cachectic.  Hygeine adequate.  Nontoxic or sickly.  Bright and alert. Eyes: Normal extraocular movements. Sclera nonicteric Neuro: No major focal sensory defects.  No major motor deficits. Psych:  No severe agitation.  No severe  anxiety.  Judgment & insight Adequate, Oriented x4, HENT: Normocephalic, Mucus membranes moist.  No thrush.   Neck: Supple, No tracheal deviation.   Chest: Good respiratory excursion.  No audible wheezing CV:  No major extremity edema Ext: No obvious deformity or contracture.  Edema: not present.  No cyanosis Skin:  Warm and dry Musculoskeletal:   Mobility: no assist device moving easily without restrictions.  No splinting or guarding.  Bleeding completely normally.  Abdomen: Incisions Clean & dry with normal healing ridge Nontender.  Soft.   Nondistended.   She has a narrow belt line minor in her upper abdomen.  Her panniculus has swelling on the left side with subcutaneous mass consistent with postoperative seroma.  It is not red or tender.  No peau d'orange.  No pain or guarding.  No evidence of any infection.  Gen:  Inguinal hernia: Not present.  Inguinal lymph nodes: without lymphadenopathy.  No panniculitis.  No red rash and groins or under panniculus.  Rectal: (Deferred)  April [name], CMA, was included in the room as chaperone for sensitive portions of the exam      Assessment and Plan:   April [name] is a 63 y.o. female recovering s/p robotic reduction of colon out of left-sided spigelian and ventral wall hernia.  Right femoral hernia.  Broad mesh underlay repair  There are no diagnoses linked to this encounter.   Considering she is only a week and a half out, she looks great.  She does  have some swelling in her left panniculus and the hernia sac from her moderately large spigelian type ventral hernia.  However there is no evidence of any infection pain or discomfort.  CAT scan consistent with seromas.  That is what this looks like.  She is not infected.  Stop antibiotics.  They are concerned that she was getting a red rash in her groins underneath and that is dried up with the nystatin powders.  She can continue to use that for the next few weeks to keep things at  bay.  Encouraged her to wear the abdominal binder like a lower belt to help hold up her panniculus for comfort.  That might help the seroma shrink a little bit faster as well.  Usually people find it helpful the first month or so that if needed.  I think she wants to follow-up next month/make sure she is doing okay.  That is not unreasonable.  Should she feel a lot worse with pain fevers or failure to thrive, then she may get an infected seroma that needs to drain.  I think that is highly unlikely given this was all done robotically and she looks well so far.  She feels reassured.  No follow-ups on file.   The plan was discussed in detail with the patient today, who expressed understanding & appreciation.  The patient has my contact information, and understands to call me with any additional questions or concerns in the interval.  I would be happy to see the patient back sooner if the need arises.  Of note, portions of this report may have been transcribed using voice recognition software. Every effort was made to ensure accuracy; however, inadvertent computerized transcription errors may be present.   Any transcriptional errors that result from this process are unintentional.   April KYM Schultze, MD, FACS, MASCRS Esophageal, Gastrointestinal & Colorectal Surgery Robotic and Minimally Invasive Surgery  [facility]  [address], [address] [place], [place] [address] [phone] Fax [phone] Main              07/04/2024
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
## 2024-07-19 ENCOUNTER — Ambulatory Visit (INDEPENDENT_AMBULATORY_CARE_PROVIDER_SITE_OTHER): Admitting: Physician Assistant

## 2024-07-27 ENCOUNTER — Encounter (HOSPITAL_COMMUNITY): Admitting: Psychiatry

## 2024-07-28 ENCOUNTER — Ambulatory Visit (INDEPENDENT_AMBULATORY_CARE_PROVIDER_SITE_OTHER): Admitting: Psychiatry

## 2024-07-28 VITALS — BP 137/96 | HR 79 | Wt 232.8 lb

## 2024-07-28 DIAGNOSIS — F1220 Cannabis dependence, uncomplicated: Secondary | ICD-10-CM | POA: Diagnosis not present

## 2024-07-28 DIAGNOSIS — Z8659 Personal history of other mental and behavioral disorders: Secondary | ICD-10-CM

## 2024-07-28 DIAGNOSIS — F22 Delusional disorders: Secondary | ICD-10-CM

## 2024-07-28 MED ORDER — ARIPIPRAZOLE 2 MG PO TABS: 2.0000 mg | ORAL_TABLET | Freq: Every day | ORAL | 0 refills | Status: DC

## 2024-07-31 ENCOUNTER — Telehealth (HOSPITAL_COMMUNITY): Payer: Self-pay | Admitting: Psychiatry

## 2024-07-31 DIAGNOSIS — Z4889 Encounter for other specified surgical aftercare: Secondary | ICD-10-CM | POA: Diagnosis not present

## 2024-07-31 DIAGNOSIS — Z6841 Body Mass Index (BMI) 40.0 and over, adult: Secondary | ICD-10-CM | POA: Diagnosis not present

## 2024-07-31 DIAGNOSIS — K4190 Unilateral femoral hernia, without obstruction or gangrene, not specified as recurrent: Secondary | ICD-10-CM | POA: Diagnosis not present

## 2024-07-31 DIAGNOSIS — K436 Other and unspecified ventral hernia with obstruction, without gangrene: Secondary | ICD-10-CM | POA: Diagnosis not present

## 2024-08-06 NOTE — Progress Notes (Unsigned)
 SUBJECTIVE: Discussed the use of AI scribe software for clinical note transcription with the patient, who gave verbal consent to proceed.  Chief Complaint: Obesity  Interim History: She maintained her weight since her last visit.  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 Retrorectus mesh repairs on 06/23/2024 - Dr. Elspeth Sheldon [name] is here to discuss her progress with her obesity treatment plan. She is on the Category 2 Plan and states she is following her eating plan approximately 85 % of the time. She states she is exercising walking 30 minutes 5 times per week.  April [name] is a 63 year old female who presents for follow-up of her obesity treatment plan.  She is on a category two obesity treatment plan and has maintained her weight since the last visit, currently weighing 231 pounds, down from 242 pounds at home post-surgery.  She feels she is consuming whole foods adequately, getting sufficient protein, and staying hydrated, although she sometimes skips meals. She engages in walking for thirty minutes five days a week.  She is recovering from a hernia repair performed several weeks ago. She finds the abdominal binder uncomfortable but has worn it as instructed. She experiences difficulty with physical movements, particularly involving core muscles, and sometimes feels a stinging sensation in her abdomen, especially when removing the binder. She is gradually increasing her activity level. Her incisions are healing well.   She has discontinued Topiramate  (Topamax ) at bedtime due to lack of noticeable change while she was taking it. Post-surgery, she was on hydrocodone  and a muscle spasm medication. She has resumed taking Abilify. Her sleep is inconsistent, averaging five to six hours, and she finds it difficult to turn over in  bed due to post-surgical discomfort.  She adheres to a high fiber diet, prepares soups at home, and ensures she has prepared meals to prevent constipation. She reports no current issues with constipation and prefers making smoothies at home with high fiber ingredients to avoid added sugars.  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 She recently celebrated her birthday with her grandchild. Reports occasional stinging pain at the incision site, difficulty with physical movements, and disrupted sleep. OBJECTIVE: Visit Diagnoses: Problem List Items Addressed This Visit     Polyphagia - Primary   Obesity (HCC)- Start BMI 50.0   Prediabetes   Other Visit Diagnoses       S/P repair of ventral hernia         Spigelian hernia         BMI 45.0-49.9, adult (HCC) Current BMI 45.1         Obesity with polyphagia April [name] is on a category two plan, maintaining her weight since the last visit. She reports consuming whole foods, adequate protein, and sufficient water  intake. She is walking 30 minutes five days per week. She has lost weight post-surgery, currently weighing 231 pounds, down from 242 pounds, resulting in maintenance of previous weight loss. Bio impedence scale results are significantly different from previous values and question accuracy of today's reading other than overall weight being the same. Will compare with next visits values.  Polyphagia with fair to moderate control. Not currently taking topiramate  and can revisit resumption if significant cravings recur .  - Can resume topiramate  25 mg nightly  if craving sugary snacks.  - Continue current  dietary plan focusing on whole foods and adequate protein intake. - Increase walking duration by 10 minutes the first week after the next visit, and by 15 minutes the second week, aiming for 45 minutes five days a week. - Avoid smoothies from commercial outlets due to high sugar content; continue making  homemade smoothies with high fiber content. - Bring a copy of smoothie ingredients to the next visit for review.  Prediabetes Focus remains on weight management and dietary habits, which are crucial for prediabetes control. Lab Results  Component Value Date   HGBA1C 5.4 02/24/2024   HGBA1C 5.8 (H) 02/05/2023   HGBA1C 5.5 03/13/2022   Lab Results  Component Value Date   LDLCALC 140 (H) 02/24/2024   CREATININE 0.52 06/13/2024   INSULIN   Date Value Ref Range Status  02/24/2024 15.5 2.6 - 24.9 uIU/mL Final  ]Continue working on nutrition plan to decrease simple carbohydrates, increase lean proteins and exercise to promote weight loss, improve glycemic control and prevent progression to Type 2 diabetes.  - Continue current dietary plan focusing on whole foods and adequate protein intake.  Postoperative state, ventral hernia repair Recovering well from ventral hernia/spigelian repair performed by Dr. Elspeth [name]. No infections or complications reported. She is wearing an abdominal binder and walking as advised. Experiencing occasional discomfort at incision sites, especially when removing the binder. - Continue wearing the abdominal binder as needed for comfort. - Continue walking 30 minutes daily, increasing duration as tolerated. - Contact Dr. Schultze if any complications arise or for further guidance on lifting restrictions. COntinue to work on nutrition plan and exercise to promote healthy weight loss and decrease risks of complications with healing/recurrence.  Vitals Temp: 98.4 F (36.9 C) BP: 124/78 Pulse Rate: (!) 6 SpO2: 99 %   Anthropometric Measurements Height: 5' (1.524 m) Weight: 231 lb (104.8 kg) BMI (Calculated): 45.11 Weight at Last Visit: 231 lb Weight Lost Since Last Visit: 0 Weight Gained Since Last Visit: 0 Starting Weight: 256 lb Total Weight Loss (lbs): 25 lb (11.3 kg) Peak Weight: 268 lb   Body Composition  Body Fat %: 51.5 % Fat Mass (lbs): 119  lbs Muscle Mass (lbs): 106.4 lbs Total Body Water  (lbs): 79.6 lbs Visceral Fat Rating : 18   Other Clinical Data Fasting: no Labs: no Today's Visit #: 56 Starting Date: 06/24/19     ASSESSMENT AND PLAN:  Diet: April [name] is currently in the action stage of change. As such, her goal is to continue with weight loss efforts. She has agreed to Category 2 Plan.  Exercise: April [name] has been instructed to try a geriatric exercise plan and that some exercise is better than none for weight loss and overall health benefits.   Behavior Modification:  We discussed the following Behavioral Modification Strategies today: increasing lean protein intake, decreasing simple carbohydrates, increasing vegetables, increase H2O intake, increase high fiber foods, no skipping meals, meal planning and cooking strategies, avoiding temptations, planning for success, and keep a strict food journal. We discussed various medication options to help April [name] with her weight loss efforts and we both agreed to continue current treatment plans.  Return in about 4 weeks (around 09/04/2024).April [name] She was informed of the importance of frequent follow up visits to maximize her success with intensive lifestyle modifications for her multiple health conditions.  Attestation Statements:   Reviewed by clinician on day of visit: allergies, medications, problem list, medical history, surgical history, family history, social history, and previous encounter notes.   Time spent on visit  including pre-visit chart review and post-visit care and charting was 36 minutes.    Trooper Olander, PA-C

## 2024-08-07 ENCOUNTER — Encounter (INDEPENDENT_AMBULATORY_CARE_PROVIDER_SITE_OTHER): Payer: Self-pay | Admitting: Physician Assistant

## 2024-08-07 ENCOUNTER — Ambulatory Visit (INDEPENDENT_AMBULATORY_CARE_PROVIDER_SITE_OTHER): Admitting: Physician Assistant

## 2024-08-07 VITALS — BP 124/78 | HR 6 | Temp 98.4°F | Ht 60.0 in | Wt 231.0 lb

## 2024-08-07 DIAGNOSIS — R7303 Prediabetes: Secondary | ICD-10-CM | POA: Diagnosis not present

## 2024-08-07 DIAGNOSIS — R632 Polyphagia: Secondary | ICD-10-CM | POA: Diagnosis not present

## 2024-08-07 DIAGNOSIS — E669 Obesity, unspecified: Secondary | ICD-10-CM | POA: Diagnosis not present

## 2024-08-07 DIAGNOSIS — Z9889 Other specified postprocedural states: Secondary | ICD-10-CM

## 2024-08-07 DIAGNOSIS — K439 Ventral hernia without obstruction or gangrene: Secondary | ICD-10-CM

## 2024-08-07 DIAGNOSIS — Z6841 Body Mass Index (BMI) 40.0 and over, adult: Secondary | ICD-10-CM | POA: Diagnosis not present

## 2024-08-07 DIAGNOSIS — E559 Vitamin D deficiency, unspecified: Secondary | ICD-10-CM

## 2024-08-07 DIAGNOSIS — I2510 Atherosclerotic heart disease of native coronary artery without angina pectoris: Secondary | ICD-10-CM

## 2024-08-07 DIAGNOSIS — Z8719 Personal history of other diseases of the digestive system: Secondary | ICD-10-CM | POA: Diagnosis not present

## 2024-08-22 DIAGNOSIS — E7800 Pure hypercholesterolemia, unspecified: Secondary | ICD-10-CM | POA: Diagnosis not present

## 2024-08-23 ENCOUNTER — Ambulatory Visit: Payer: Self-pay | Admitting: Cardiology

## 2024-08-23 LAB — HEPATIC FUNCTION PANEL
ALT: 8 [IU]/L (ref 0–32)
AST: 18 [IU]/L (ref 0–40)
Albumin: 4.5 g/dL (ref 3.9–4.9)
Alkaline Phosphatase: 125 [IU]/L (ref 49–135)
Bilirubin Total: 0.3 mg/dL (ref 0.0–1.2)
Bilirubin, Direct: 0.08 mg/dL (ref 0.00–0.40)
Total Protein: 8 g/dL (ref 6.0–8.5)

## 2024-08-23 LAB — LIPID PANEL
Chol/HDL Ratio: 3.2 ratio (ref 0.0–4.4)
Cholesterol, Total: 180 mg/dL (ref 100–199)
HDL: 56 mg/dL
LDL Chol Calc (NIH): 109 mg/dL — ABNORMAL HIGH (ref 0–99)
Triglycerides: 81 mg/dL (ref 0–149)
VLDL Cholesterol Cal: 15 mg/dL (ref 5–40)

## 2024-08-25 ENCOUNTER — Encounter (HOSPITAL_COMMUNITY): Admitting: Psychiatry

## 2024-08-27 ENCOUNTER — Other Ambulatory Visit (HOSPITAL_COMMUNITY): Payer: Self-pay | Admitting: Psychiatry

## 2024-08-27 DIAGNOSIS — F22 Delusional disorders: Secondary | ICD-10-CM

## 2024-08-27 NOTE — Progress Notes (Unsigned)
 SUBJECTIVE: Discussed the use of AI scribe software for clinical note transcription with the patient, who gave verbal consent to proceed.  Chief Complaint: Obesity  Interim History: She has maintained her weight since her last visit.  Down 25 lbs overall TBW loss of 9.8%  Cythia is here to discuss her progress with her obesity treatment plan. She is on the Category 2 Plan and states she is following her eating plan approximately 80 % of the time. She states she is exercising walking 30 minutes 7 times per week.  Maalle LAKENDRA HELLING is a 63 year old female with obesity who presents for follow-up of her obesity treatment plan.  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 Retrorectus mesh repairs on 06/23/2024 - Dr. Elspeth Schultze   Her weight has remained stable since the last visit, maintaining a total weight loss of 25 pounds. She follows a category two diet plan approximately 80% of the time and is incorporating more vegetables and fruits into her diet. Her meals include soups, refried beans, salmon, and 200-calorie snacks. She makes smoothies with ingredients like frozen raspberries, peanut butter protein powder, chocolate protein powder, banana, and nonfat Fage yogurt. Additionally, she prepares chicken bone broth and stuffed pepper soup with vegetables such as celery, onions, garlic, and carrots. We discussed incorporating more protein into soups and fewer bananas in her protein smoothies to promote weight loss and muscle gain currently.   She is recovering from a recent ventral hernia repair and still feels sore. She is able to walk for 30 minutes a day and is trying to increase her physical activity. She has noticed changes in her activity level and is trying to increase her physical activity since her surgery. She wears a binder post-surgery and reports improved bowel movements  with dietary changes.  She took topiramate  25 to 50 mg at bedtime as needed for polyphagia but does not use it consistently and does not want to resume regularly currently. She is on rosuvastatin  5 mg every other day for hyperlipidemia. Her medical history includes prediabetes, vitamin D  deficiency, elevated coronary calcium  score, and significant mental health issues. She has been on Abilify and trazodone  in the past.  She sleeps about five hours a night, sometimes waking up at 4 AM, and occasionally takes naps during the day. She lives alone and manages her daily activities independently. She is trying to drink enough water  and not skip meals.  She mentions a change in her food preferences post-surgery, with less desire for certain foods like sirloin and beef.  She experienced a craving for ice cream once and has had cravings for chocolate and cake in the past, but these have subsided. She is cautious about her cravings and tries to avoid them by not keeping tempting foods in sight. OBJECTIVE: Visit Diagnoses: Problem List Items Addressed This Visit     Polyphagia - Primary   Obesity (HCC)- Start BMI 50.0   Hyperlipidemia, pure   Prediabetes   Other Visit Diagnoses       S/P repair of ventral hernia         Mental health disorder         BMI 45.0-49.9, adult (HCC) Current BMI 45.1         Obesity with polyphagia Management is ongoing with a total weight loss of 25 pounds. Current weight is stable. Muscle mass has decreased from 120.8 pounds to 104.8  pounds since September, likely due to reduced activity post-surgery. Adipose tissue has increased.  Dietary changes include increased intake of fruits, vegetables, and lean proteins. Exercise is limited due to post-surgical recovery, but walking for 30 minutes daily is maintained. Cravings for ice cream and chocolate have decreased.  Protein intake needs optimization to support muscle mass recovery. Polyphagia - fair to moderate control  currently. Had some increased cravings, but seems better with refocusing on plan- Does not want to take medication- topiramate  regularly.  - Continue current dietary plan with emphasis on protein intake. - Increase protein intake by adding more chicken to soups and continue using protein powder or yogurt in smoothies. - Encouraged gradual increase in physical activity as recovery progresses. - Continue journaling dietary intake and physical activity.  Status post ventral and Spigelian hernia repair Recovery is ongoing with soreness and still somewhat limited activity. Bowel movements have improved with dietary changes. Muscle mass has decreased, likely due to reduced activity post-surgery. Recovery is expected to take several months. - Continue wearing abdominal binder as needed/instructed by Dr. Sheldon. - Encouraged gradual increase in physical activity as tolerated.  Hyperlipidemia Managed with rosuvastatin  5 mg every other day. No SE reported.  Last lipids Lab Results  Component Value Date   CHOL 180 08/22/2024   HDL 56 08/22/2024   LDLCALC 109 (H) 08/22/2024   LDLDIRECT 153 (H) 08/16/2020   TRIG 81 08/22/2024   CHOLHDL 3.2 08/22/2024   LDL not at goal, HDL at goal, trig at goal.  Continue to work on nutrition plan -decreasing simple carbohydrates, increasing lean proteins, decreasing saturated fats and cholesterol , avoiding trans fats and exercise as able to promote weight loss, improve lipids and decrease cardiovascular risks. - Continue rosuvastatin  5 mg every other day.  Prediabetes Managed with dietary modifications and weight management. Current dietary changes include increased intake of fruits, vegetables, and lean proteins. On no medication for prediabetes as prefers not to add medication.  Lab Results  Component Value Date   HGBA1C 5.4 02/24/2024   HGBA1C 5.8 (H) 02/05/2023   HGBA1C 5.5 03/13/2022   Lab Results  Component Value Date   LDLCALC 109 (H) 08/22/2024    CREATININE 0.52 06/13/2024  A1c improved.  She wishes to continue to work on weight loss and exercise and not start medication for weight loss.  Plan: Continue working on nutrition plan to decrease simple carbohydrates, increase lean proteins and exercise to promote weight loss, improve glycemic control and prevent progression to Type 2 diabetes.    Mental health  Managed with Abilify and trazodone . Sleep is disrupted post-surgery, with occasional daytime naps. Stress and recovery from surgery may impact mental health. - Continue current medications: Abilify and trazodone . Continue to follow up with mental health providers as instructed.  - Encouraged gradual increase in physical activity to improve sleep quality.  Vitals Temp: (!) 97.4 F (36.3 C) BP: 122/81 Pulse Rate: 60 SpO2: 97 %   Anthropometric Measurements Height: 5' (1.524 m) Weight: 231 lb (104.8 kg) BMI (Calculated): 45.11 Weight at Last Visit: 231 lb Weight Lost Since Last Visit: 0 Weight Gained Since Last Visit: 0 Starting Weight: 256 lb Total Weight Loss (lbs): 25 lb (11.3 kg) Peak Weight: 268 lb   Body Composition  Body Fat %: 52.2 % Fat Mass (lbs): 120.6 lbs Muscle Mass (lbs): 104.8 lbs Total Body Water  (lbs): 80.2 lbs Visceral Fat Rating : 19   Other Clinical Data Fasting: No Labs: No Today's Visit #: 78 Starting Date:  06/24/19     ASSESSMENT AND PLAN:  Diet: Shawntelle is currently in the action stage of change. As such, her goal is to continue with weight loss efforts. She has agreed to Category 2 Plan.  Exercise: Mercia has been instructed to work up to a goal of 150 minutes of combined cardio and strengthening exercise per week for weight loss and overall health benefits.   Behavior Modification:  We discussed the following Behavioral Modification Strategies today: increasing lean protein intake, decreasing simple carbohydrates, increasing vegetables, increase H2O intake, increase high  fiber foods, meal planning and cooking strategies, holiday eating strategies, avoiding temptations, and planning for success. We discussed various medication options to help Faithann with her weight loss efforts and we both agreed to continue current treatment plan.  Return in about 5 weeks (around 10/02/2024).SABRA She was informed of the importance of frequent follow up visits to maximize her success with intensive lifestyle modifications for her multiple health conditions.  Attestation Statements:   Reviewed by clinician on day of visit: allergies, medications, problem list, medical history, surgical history, family history, social history, and previous encounter notes.   Time spent on visit including pre-visit chart review and post-visit care and charting was 41 minutes.    Jc Veron, PA-C

## 2024-08-28 ENCOUNTER — Other Ambulatory Visit (HOSPITAL_COMMUNITY): Payer: Self-pay | Admitting: Psychiatry

## 2024-08-28 ENCOUNTER — Encounter (INDEPENDENT_AMBULATORY_CARE_PROVIDER_SITE_OTHER): Payer: Self-pay | Admitting: Physician Assistant

## 2024-08-28 ENCOUNTER — Ambulatory Visit (INDEPENDENT_AMBULATORY_CARE_PROVIDER_SITE_OTHER): Payer: Self-pay | Admitting: Physician Assistant

## 2024-08-28 VITALS — BP 122/81 | HR 60 | Temp 97.4°F | Ht 60.0 in | Wt 231.0 lb

## 2024-08-28 DIAGNOSIS — F22 Delusional disorders: Secondary | ICD-10-CM

## 2024-08-28 DIAGNOSIS — R7303 Prediabetes: Secondary | ICD-10-CM | POA: Diagnosis not present

## 2024-08-28 DIAGNOSIS — F99 Mental disorder, not otherwise specified: Secondary | ICD-10-CM

## 2024-08-28 DIAGNOSIS — Z9889 Other specified postprocedural states: Secondary | ICD-10-CM

## 2024-08-28 DIAGNOSIS — Z6841 Body Mass Index (BMI) 40.0 and over, adult: Secondary | ICD-10-CM | POA: Diagnosis not present

## 2024-08-28 DIAGNOSIS — E785 Hyperlipidemia, unspecified: Secondary | ICD-10-CM | POA: Diagnosis not present

## 2024-08-28 DIAGNOSIS — E7849 Other hyperlipidemia: Secondary | ICD-10-CM

## 2024-08-28 DIAGNOSIS — Z8719 Personal history of other diseases of the digestive system: Secondary | ICD-10-CM

## 2024-08-28 DIAGNOSIS — R632 Polyphagia: Secondary | ICD-10-CM

## 2024-08-28 DIAGNOSIS — E559 Vitamin D deficiency, unspecified: Secondary | ICD-10-CM

## 2024-08-28 DIAGNOSIS — I2510 Atherosclerotic heart disease of native coronary artery without angina pectoris: Secondary | ICD-10-CM

## 2024-08-28 MED ORDER — ARIPIPRAZOLE 2 MG PO TABS: 2.0000 mg | ORAL_TABLET | Freq: Every day | ORAL | 0 refills | Status: DC

## 2024-08-28 NOTE — Progress Notes (Signed)
 Sent in refill of abilify from pharmacy request.

## 2024-09-04 ENCOUNTER — Other Ambulatory Visit: Payer: Self-pay

## 2024-09-04 DIAGNOSIS — E7800 Pure hypercholesterolemia, unspecified: Secondary | ICD-10-CM

## 2024-09-04 MED ORDER — ROSUVASTATIN CALCIUM 10 MG PO TABS: 10.0000 mg | ORAL_TABLET | Freq: Every day | ORAL | 3 refills | Status: DC
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
## 2024-09-14 ENCOUNTER — Encounter (HOSPITAL_COMMUNITY): Admitting: Psychiatry

## 2024-09-19 ENCOUNTER — Other Ambulatory Visit (HOSPITAL_COMMUNITY): Payer: Self-pay | Admitting: Psychiatry

## 2024-09-19 DIAGNOSIS — F22 Delusional disorders: Secondary | ICD-10-CM

## 2024-09-20 ENCOUNTER — Other Ambulatory Visit (HOSPITAL_COMMUNITY): Payer: Self-pay | Admitting: Psychiatry

## 2024-09-20 DIAGNOSIS — F22 Delusional disorders: Secondary | ICD-10-CM

## 2024-09-20 MED ORDER — ARIPIPRAZOLE 2 MG PO TABS: 2.0000 mg | ORAL_TABLET | Freq: Every day | ORAL | 0 refills | Status: DC

## 2024-09-20 NOTE — Progress Notes (Signed)
 Sent in refill of abilify  per pharmacy request, next appt 10/26/24

## 2024-09-25 ENCOUNTER — Ambulatory Visit: Admitting: Student

## 2024-09-25 VITALS — BP 132/81 | HR 71 | Ht 60.0 in | Wt 238.1 lb

## 2024-09-25 DIAGNOSIS — Z23 Encounter for immunization: Secondary | ICD-10-CM

## 2024-09-25 DIAGNOSIS — R4189 Other symptoms and signs involving cognitive functions and awareness: Secondary | ICD-10-CM | POA: Diagnosis not present

## 2024-09-25 DIAGNOSIS — F22 Delusional disorders: Secondary | ICD-10-CM | POA: Diagnosis not present

## 2024-09-25 NOTE — Progress Notes (Cosign Needed Addendum)
° ° °  SUBJECTIVE:   CHIEF COMPLAINT / HPI:   Discussed the use of AI scribe software for clinical note transcription with the patient, who gave verbal consent to proceed.  History of Present Illness April [name] is a 63 year old female who presents with concerns about dementia and personal care services.  Cognitive function concerns  Delusional Disorder - Concerned about possible dementia after receiving dementia-related materials and mail.  On observation of package from this provider, there was return to sender stamped onto package-with original sticker covered.  This was suggestive of patient sending this package herself, and then receiving it again in the mail. - At times patient became tangential, often referring to her childhood and  strong desire to be successful and  family holding her back and believes this may be a sign of having dementia in childhood-clarified dementia almost always acquired in older age, outside of a few rare conditions. - She mentions that she thinks she was born with a dementia identification bracelet at birth - Requests information about dementia testing - No memory loss affecting conversations, appointments, or daily function - Manages finances, medications, and activities of daily living independently  Personal care needs and social support - Lives alone - Experiences burnout and fatigue from managing all daily tasks herself - Interested in companionship and assistance with daily activities - Seeks guidance on eligibility and process for personal care services - Counseled at this time she is not have a clear indication for personal care services to be covered under her insurance. - Since her primary concern is companionship, I recommend she get involved in her local senior center/[employer] or look up other senior activities online with the help of her daughter  Vaginal dryness - Requests information on how to obtain additional lubricant for vaginal  dryness  Dermatologic findings - Has vitiligo, thought this was related to vaginal dryness.  Clarified it does not.  OBJECTIVE:   BP 132/81   Pulse 71   Ht 5' (1.524 m)   Wt 238 lb 2 oz (108 kg)   SpO2 100%   BMI 46.51 kg/m    General: NAD, pleasant Cardio: RRR, no MRG. Respiratory: CTAB, normal wob on RA Psych: Normal mood/affect, tangential at times Neuro: Alert oriented x 4  ASSESSMENT/PLAN:   Assessment & Plan Delusional disorder (HCC) Difficult to distinguish impaired cognitive ability versus delusions.  She is alert and oriented x 4, and memory does not appear to be a component of her concerns.  She reports taking her topiramate , trazodone  and Abilify .  Last seen in person by her psychiatrist Dr. Stephanie Chien in August.  At this time she is not a harm to herself or others. - Will send staff message to her primary psychiatrist to provide quick update on her delusional disorder, and see if they would like to schedule an appointment with her for follow-up. Impaired cognitive ability -Referral for cognitive testing placed, patient requesting daughter attend Encounter for immunization -COVID/flu shot today.    Gladis Church, DO [facility]

## 2024-09-25 NOTE — Patient Instructions (Signed)
 It was great to see you! Thank you for allowing me to participate in your care!   I recommend that you always bring your medications to each appointment as this makes it easy to ensure we are on the correct medications and helps us  not miss when refills are needed.  Our plans for today:  - You will receive a call to schedule your cognitive testing in our clinic - You can continue to use water -based lubricants, they do not need to be brand-name  Take care and seek immediate care sooner if you develop any concerns. Please remember to show up 15 minutes before your scheduled appointment time!  Gladis Church, DO [facility]

## 2024-10-02 ENCOUNTER — Telehealth: Payer: Self-pay

## 2024-10-02 NOTE — Telephone Encounter (Signed)
 Spoke with patient. She made Geri appt for Jan 8th at 1:30. Her Daughter Nestora Ada [phone]. She will be attending the appt. Nelson Land, CMA

## 2024-10-03 ENCOUNTER — Ambulatory Visit (INDEPENDENT_AMBULATORY_CARE_PROVIDER_SITE_OTHER): Admitting: Physician Assistant

## 2024-10-17 NOTE — Telephone Encounter (Signed)
 Spoke with patient to confirm that she is still coming to the Geri appt on 1/8 at 1:30. Patient stated that she is still plans on coming to this appt.I reminded patient to bring all medications and the Geri Assessment form is she still has it. .Vannah Nadal, CMA

## 2024-10-19 ENCOUNTER — Ambulatory Visit: Admitting: Family Medicine

## 2024-10-19 VITALS — BP 135/88 | HR 73 | Ht 60.0 in | Wt 233.5 lb

## 2024-10-19 DIAGNOSIS — Z9189 Other specified personal risk factors, not elsewhere classified: Secondary | ICD-10-CM | POA: Insufficient documentation

## 2024-10-19 DIAGNOSIS — R7989 Other specified abnormal findings of blood chemistry: Secondary | ICD-10-CM | POA: Diagnosis not present

## 2024-10-19 DIAGNOSIS — Z5941 Food insecurity: Secondary | ICD-10-CM | POA: Diagnosis not present

## 2024-10-19 DIAGNOSIS — F22 Delusional disorders: Secondary | ICD-10-CM | POA: Diagnosis not present

## 2024-10-19 DIAGNOSIS — R4189 Other symptoms and signs involving cognitive functions and awareness: Secondary | ICD-10-CM
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
## 2024-10-19 NOTE — Progress Notes (Signed)
 Entered in error

## 2024-10-19 NOTE — Progress Notes (Signed)
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 look for structural explanation for the late onset of delusions. Order placed.   - Discussed role of exercise, diet and social activity in slowing memory decline.  We did not discuss role of alcohol and marijuana in cognitive impairment.   - I agree with letting the patient's psychiatrist know of the daughter's observation of her mother erotomanic behaviors with a question of further adjustment of psychotropic medications.   - Recommend periodic assessment of patient's iADL abilities to look for evidence of progressive cognitive decline.   - Referral sent to Complex Community care to help patient with her concern for food insecurity. Community care RN was involved with Ms Daino's case last year.    35  minutes face to face were spent in total with precharting, history & physical gathering, nterdisciplinary discussion, patient and caretaker counseling and coordination of care and documentation. The Geriatric medical team meet to discuss the patient's Greater than 50% of this time was spent in counseling, explanation of diagnosis, planning of further management, and coordination of care.     "

## 2024-10-19 NOTE — Patient Instructions (Signed)
 April [name] - Thank you so very much for coming in today.  It was a pleasure meeting you.   I have included information about Mild Memory Impairment.    Remember that walking, eating healthy (Mediterranean Diet) and staying socially active helps keep your memory strong.   We are ordering a Brain MRI to make sure there is not anything causing the slight decrease in your memory.   I will ask the Complex Care Nurse to contact you to see if there are food resources you may wish to avail yourself of.

## 2024-10-20 ENCOUNTER — Encounter: Payer: Self-pay | Admitting: Family Medicine

## 2024-10-20 DIAGNOSIS — R7989 Other specified abnormal findings of blood chemistry: Secondary | ICD-10-CM | POA: Insufficient documentation

## 2024-10-20 DIAGNOSIS — F1090 Alcohol use, unspecified, uncomplicated: Secondary | ICD-10-CM | POA: Insufficient documentation

## 2024-10-20 DIAGNOSIS — R4189 Other symptoms and signs involving cognitive functions and awareness: Secondary | ICD-10-CM | POA: Insufficient documentation

## 2024-10-20 NOTE — Assessment & Plan Note (Signed)
 New issue Vitamin B12 1,970 (H) (02/24/24) Patient reports taking Vitamin B12 supplement. This is the most likely cause.  Other causes: liver disease, alcohol use disorder with hepatocellular damage- Comprehensive Metabolic Panel 06/13/24.   Encouraged to take no more than 1000 mg daily of Vitmin B12 supplement.  May want to recheck serum level again in 3 to 6 months.

## 2024-10-20 NOTE — Assessment & Plan Note (Signed)
 Question of Delusional Disorder-erotomanic type, persecutory type not under adequate symptomatic control to prevent impairment of interpersonal functioning.   April [name] reports taking Abilify  prescribed by psychiatry at [facility].   Makayla, daughter, will reach out to [facility] to provide observations of her mother's behaviors.

## 2024-10-20 NOTE — Assessment & Plan Note (Addendum)
 Ms Cadet presented with personal concern of having dementia. She acknolwedges difficulty with her memory that does not impair her ability to perform the most complex of her iADLs.  Her ability to successfully complete her iADLs was confirmed by her daughter.   Ms Freels's MoCA score was low with 19 out of 30 points.  Her evaluation is complicated by her concurrent alcohol and marijuana use, and active delusion.    Assessment and Plan  - My working understanding of Ms Glauser's condition is that she has cognitive impairment with potentially reversible conditions that can impair cognition.  Whether or not she has a neurodegenerative condition is uncertain.    -Recommend a brain MRI w/o CM to look for structural explanation for the late onset of delusions. Order placed.   - Discussed role of exercise, diet and social activity in slowing memory decline.  We did not discuss role of alcohol and marijuana in cognitive impairment.   - I agree with letting the patient's psychiatrist know of the daughter's observation of her mother erotomanic behaviors with a question of further adjustment of psychotropic medications.   - Recommend periodic assessment of patient's iADL abilities to look for evidence of progressive cognitive decline.   - Referral sent to Complex Community care to help patient with her concern for food insecurity. Community care RN was involved with Ms Lahmann's case last year.
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
## 2024-10-25 ENCOUNTER — Telehealth: Payer: Self-pay | Admitting: *Deleted

## 2024-10-25 NOTE — Progress Notes (Signed)
 Complex Care Management Note  Care Guide Note 10/25/2024 Name: ZHOEY BLACKSTOCK MRN: [id number] DOB: 1961/09/28  Sinda JONELLE Mccumbers is a 64 y.o. year old female who sees Howell Lunger, [place] for primary care. I reached out to Sinda JONELLE Babe by phone today to offer complex care management services.  Ms. Freet was given information about Complex Care Management services today including:   The Complex Care Management services include support from the care team which includes your Nurse Care Manager, Clinical Social Worker, or Pharmacist.  The Complex Care Management team is here to help remove barriers to the health concerns and goals most important to you. Complex Care Management services are voluntary, and the patient may decline or stop services at any time by request to their care team member.   Complex Care Management Consent Status: Patient agreed to services and verbal consent obtained.   Follow up plan:  Telephone appointment with complex care management team member scheduled for:  1/16  Encounter Outcome:  Patient Scheduled  Harlene Satterfield  [facility]  [facility], [facility] Guide  Direct Dial: [phone]  Fax [phone]

## 2024-10-26 ENCOUNTER — Telehealth (HOSPITAL_COMMUNITY): Payer: Self-pay

## 2024-10-26 ENCOUNTER — Ambulatory Visit (INDEPENDENT_AMBULATORY_CARE_PROVIDER_SITE_OTHER): Admitting: Psychiatry

## 2024-10-26 DIAGNOSIS — Z8659 Personal history of other mental and behavioral disorders: Secondary | ICD-10-CM

## 2024-10-26 DIAGNOSIS — F1220 Cannabis dependence, uncomplicated: Secondary | ICD-10-CM

## 2024-10-26 DIAGNOSIS — F22 Delusional disorders: Secondary | ICD-10-CM

## 2024-10-26 MED ORDER — ARIPIPRAZOLE 5 MG PO TABS: 5.0000 mg | ORAL_TABLET | Freq: Every day | ORAL | 0 refills | Status: AC

## 2024-10-27 ENCOUNTER — Other Ambulatory Visit: Payer: Self-pay

## 2024-10-27 DIAGNOSIS — Z139 Encounter for screening, unspecified: Secondary | ICD-10-CM
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
## 2024-10-27 NOTE — Patient Instructions (Signed)
 Visit Information  April [name] was given information about Medicaid Managed Care team care coordination services as a part of their Healthy [facility] Medicaid benefit. [name]   If you would like to schedule transportation through your Healthy [facility] plan, please call the following number at least 2 days in advance of your appointment: [phone]  For information about your ride after you set it up, call Ride Assist at [phone]. Use this number to activate a Will Call pickup, or if your transportation is late for a scheduled pickup. Use this number, too, if you need to make a change or cancel a previously scheduled reservation.  If you need transportation services right away, call [phone]. The after-hours call center is staffed 24 hours to handle ride assistance and urgent reservation requests (including discharges) 365 days a year. Urgent trips include sick visits, hospital discharge requests and life-sustaining treatment.  Call the [facility] Line at [phone], at any time, 24 hours a day, 7 days a week. If you are in danger or need immediate medical attention call 911.    Care plan and visit instructions communicated with the patient verbally today. Patient agrees to receive a copy in MyChart. Active MyChart status and patient understanding of how to access instructions and care plan via MyChart confirmed with patient.     Telephone follow up appointment with Managed Medicaid care management team member scheduled for: Friday, January 30th at 10:00am.   Santana Stamp BSN, CCM [place]  [facility] Population Health RN Care Manager Direct Dial: [phone]  Fax: [phone]   Following is a copy of your plan of care:   Goals Addressed             This Visit's Progress    VBCI RN Care Plan       Problems:  Care Coordination needs related to Food Insecurity   Goal: Over the next 30 days the Patient will work with community resource care  guide to address needs related to Limited access to food as evidenced by patient and/or community resource care guide support     Interventions:   Evaluation of current treatment plan related to food insecurity, Limited access to food self-management and patient's adherence to plan as established by provider. Discussed plans with patient for ongoing care management follow up and provided patient with direct contact information for care management team Care Guide referral for food pantries, senior centers offering lunches.  Patient Self-Care Activities:  Work with Care Guide for list of food pantries, senior centers offering lunches.  Plan:  Telephone follow up appointment with care management team member scheduled for:  two weeks.

## 2024-10-28 ENCOUNTER — Other Ambulatory Visit: Payer: Self-pay | Admitting: Cardiology

## 2024-10-28 DIAGNOSIS — E7849 Other hyperlipidemia: Secondary | ICD-10-CM

## 2024-10-28 MED ORDER — ROSUVASTATIN CALCIUM 10 MG PO TABS: 10.0000 mg | ORAL_TABLET | Freq: Every day | ORAL | 3 refills | Status: AC

## 2024-10-28 NOTE — Progress Notes (Signed)
" ° °  Patient reached out to outpatient on-call line regarding needing refill for Crestor  10 mg daily.  She tells me that she has misplaced this medication although it was filled recently.  I instructed her that her current medication has refills however she told me that the pharmacy requested a new prescription to be able to fill it properly.  Will place this now.  Confirmed pharmacy with patient.  No further questions.  Waddell DELENA Donath, PA-C 10/28/2024 9:57 AM  "

## 2024-10-30 ENCOUNTER — Telehealth: Payer: Self-pay | Admitting: Cardiology

## 2024-10-30 ENCOUNTER — Telehealth: Payer: Self-pay

## 2024-10-30 ENCOUNTER — Telehealth (HOSPITAL_COMMUNITY): Payer: Self-pay | Admitting: Psychiatry

## 2024-10-30 MED ORDER — ROSUVASTATIN CALCIUM 10 MG PO TABS: 10.0000 mg | ORAL_TABLET | Freq: Every day | ORAL | 0 refills | Status: AC

## 2024-10-30 NOTE — Progress Notes (Signed)
 Complex Care Management Note Care Guide Note  10/30/2024 Name: April [name] MRN: [id number] DOB: 1960/11/09   Complex Care Management Outreach Attempts: An unsuccessful telephone outreach was attempted today to offer the patient information about available complex care management services.  Follow Up Plan:  Additional outreach attempts will be made to offer the patient complex care management information and services.   Encounter Outcome:  No Answer  Pamelia Botto [facility]  [facility] Guide Direct Dial: [phone]  Fax: [phone] Website: [email or web address]

## 2024-10-30 NOTE — Telephone Encounter (Signed)
 Spoke with patient on the phone. States that she lost her medication. She called over the weekend and a provider reordered, pharmacy states insurance still will not cover. Sent a short fill over to [facility] and gave patient GoodRx card number to use to help with the cost. Advised patient they may call back if they have issues and may be transferred to me.

## 2024-10-30 NOTE — Telephone Encounter (Signed)
 Pt called and states her Abilify  was too strong. Pt stated the dosage was increased and since then, Pt muscles and bones are weak and it makes it harder to function and sleeps a lot. Pt states the Abilify  works. However, the dose is too strong and wants to lower it.

## 2024-10-30 NOTE — Telephone Encounter (Signed)
 Pt c/o medication issue:  1. Name of Medication: rosuvastatin  (CRESTOR ) 10 MG tablet   2. How are you currently taking this medication (dosage and times per day)? As written   3. Are you having a reaction (difficulty breathing--STAT)? no  4. What is your medication issue? Pt lost medication and her insurance will not cover it until 2/15. Please advise.      [facility] [phone] - [place], [place] - [address]

## 2024-11-01 ENCOUNTER — Telehealth: Payer: Self-pay

## 2024-11-01 NOTE — Progress Notes (Signed)
 Complex Care Management Note Care Guide Note  11/01/2024 Name: April [name] MRN: [id number] DOB: 01/06/1961  April [name] is a 64 y.o. year old female who is a primary care patient of Howell Lunger, DO . The community resource team was consulted for assistance with Food Insecurity  SDOH screenings and interventions completed:  Yes  Social Drivers of Health From This Encounter   Food Insecurity: Food Insecurity Present (11/01/2024)   Epic    Worried About Programme Researcher, Broadcasting/film/video in the Last Year: Sometimes true    Ran Out of Food in the Last Year: Sometimes true  Housing: Low Risk (11/01/2024)   Epic    Unable to Pay for Housing in the Last Year: No    Number of Times Moved in the Last Year: 0    Homeless in the Last Year: No  Financial Resource Strain: Medium Risk (11/01/2024)   Overall Financial Resource Strain (CARDIA)    Difficulty of Paying Living Expenses: Somewhat hard  Transportation Needs: No Transportation Needs (11/01/2024)   Epic    Lack of Transportation (Medical): No    Lack of Transportation (Non-Medical): No  Utilities: Not At Risk (11/01/2024)   Epic    Threatened with loss of utilities: No    SDOH Interventions Today    Flowsheet Row Most Recent Value  SDOH Interventions   Food Insecurity Interventions Other (Comment)  [Per patient request emailed and mailed [employer] Pantry list, [employer] list and [employer] [place] [employer] Meal list.]     Care guide performed the following interventions: Patient provided with information about care guide support team and interviewed to confirm resource needs.  Follow Up Plan:  No further follow up planned at this time. The patient has been provided with needed resources.  Encounter Outcome:  Patient Visit Completed  Demetry Bendickson [facility]  [facility] Guide Direct Dial: [phone]  Fax: [phone] Website: [email or web address]

## 2024-11-10 ENCOUNTER — Other Ambulatory Visit: Payer: Self-pay

## 2024-11-10 NOTE — Patient Instructions (Signed)
 Visit Information  Ms. Pung was given information about Medicaid Managed Care team care coordination services as a part of their Healthy [facility] Medicaid benefit. Cordie R Witherow   If you would like to schedule transportation through your Healthy [facility] plan, please call the following number at least 2 days in advance of your appointment: [phone]  For information about your ride after you set it up, call Ride Assist at [phone]. Use this number to activate a Will Call pickup, or if your transportation is late for a scheduled pickup. Use this number, too, if you need to make a change or cancel a previously scheduled reservation.  If you need transportation services right away, call [phone]. The after-hours call center is staffed 24 hours to handle ride assistance and urgent reservation requests (including discharges) 365 days a year. Urgent trips include sick visits, hospital discharge requests and life-sustaining treatment.  Call the [facility] Line at [phone], at any time, 24 hours a day, 7 days a week. If you are in danger or need immediate medical attention call 911.  Care plan and visit instructions communicated with the patient verbally today. Patient agrees to receive a copy in MyChart. Active MyChart status and patient understanding of how to access instructions and care plan via MyChart confirmed with patient.     Telephone follow up appointment with Managed Medicaid care management team member scheduled for: Friday, February 13th at 11:00am.   Santana Stamp BSN, CCM   VBCI Population Health RN Care Manager Direct Dial: [phone]  Fax: [phone]   Following is a copy of your plan of care:   Goals Addressed             This Visit's Progress    VBCI RN Care Plan   On track    Problems:  Care Coordination needs related to Food Insecurity   Goal: Over the next 30 days the Patient will work with community resource  care guide to address needs related to Limited access to food as evidenced by patient and/or community resource care guide support     Interventions:   Evaluation of current treatment plan related to food insecurity, Limited access to food self-management and patient's adherence to plan as established by provider. Discussed plans with patient for ongoing care management follow up and provided patient with direct contact information for care management team Care Guide referral for food pantries, senior centers offering lunches. UPDATE 11/10/24: Patient not sure if she received food resources by email.  Collaborated with Care Guide, she send email to this RNCM.  I was able to direct patient to her emails dated 1/21, she was able to find email with attachments.   Patient Self-Care Activities:  Work with Care Guide for list of food pantries, senior centers offering lunches.  Plan:  Telephone follow up appointment with care management team member scheduled for:  two weeks.
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
## 2024-11-12 NOTE — Progress Notes (Unsigned)
" °  TeleHealth Visit:  This visit was completed with telemedicine (audio/video) technology. Maritsa has verbally consented to this TeleHealth visit. The patient is located at home, the provider is located at home. The participants in this visit include the listed provider and patient. The visit was conducted today via MyChart video.  SUBJECTIVE: Discussed the use of AI scribe software for clinical note transcription with the patient, who gave verbal consent to proceed.  Chief Complaint: Obesity  Interim History: ***  Ula is here to discuss her progress with her obesity treatment plan. She is on the {HWW Weight Loss Plan:210964005} and states she {CHL AMB IS/IS NOT:210130109} following her eating plan approximately *** % of the time. She states she {CHL AMB IS/IS NOT:210130109} exercising *** minutes *** times per week.  Current Outpatient Medications  Medication Instructions   ARIPiprazole  (ABILIFY ) 5 mg, Oral, Daily   cetirizine  (ZYRTEC ) 10 MG tablet TAKE 1 TABLET(10 MG) BY MOUTH DAILY   diclofenac  Sodium (VOLTAREN ) 2 g, Topical, 4 times daily PRN   gabapentin  (NEURONTIN ) 100 mg, Oral, 3 times daily   meloxicam  (MOBIC ) 15 mg, Daily   metoprolol  tartrate (LOPRESSOR ) 50 MG tablet Take 50 mg 2 hours before Coronary CT   Nitroglycerin  0.4 % OINT 1 application , Rectal, 2 times daily   rosuvastatin  (CRESTOR ) 10 mg, Oral, Daily   rosuvastatin  (CRESTOR ) 10 mg, Oral, Daily   SHINGRIX injection 0.5 mLs,  Once    OBJECTIVE: Visit Diagnoses: Problem List Items Addressed This Visit     Polyphagia - Primary   Hyperlipidemia, pure   Prediabetes   Other Visit Diagnoses       S/P repair of ventral hernia         Obesity (HCC)- Start BMI 50.0           No data recorded No data recorded No data recorded No data recorded   ASSESSMENT AND PLAN:  Diet: Myliyah {CHL AMB IS/IS NOT:210130109} currently in the action stage of change. As such, her goal is to {HWW Weight Loss  Efforts:210964006}. She {HAS HAS WNU:81165} agreed to {HWW Weight Loss Plan:210964005}.  Exercise: Cheryll has been instructed {HWW Exercise:210964007} for weight loss and overall health benefits.   Behavior Modification:  We discussed the following Behavioral Modification Strategies today: {HWW Behavior Modification:210964008}. We discussed various medication options to help Aoi with her weight loss efforts and we both agreed to ***.  No follow-ups on file.SABRA She was informed of the importance of frequent follow up visits to maximize her success with intensive lifestyle modifications for her multiple health conditions.  Attestation Statements:   Reviewed by clinician on day of visit: allergies, medications, problem list, medical history, surgical history, family history, social history, and previous encounter notes.   Time spent on visit including pre-visit chart review and post-visit care and charting was *** minutes.    Mecca Guitron, PA-C  "

## 2024-11-13 ENCOUNTER — Telehealth (INDEPENDENT_AMBULATORY_CARE_PROVIDER_SITE_OTHER): Admitting: Physician Assistant

## 2024-11-13 ENCOUNTER — Encounter (INDEPENDENT_AMBULATORY_CARE_PROVIDER_SITE_OTHER): Payer: Self-pay | Admitting: Physician Assistant

## 2024-11-13 VITALS — Ht 60.0 in | Wt 231.0 lb

## 2024-11-13 DIAGNOSIS — Z6841 Body Mass Index (BMI) 40.0 and over, adult: Secondary | ICD-10-CM

## 2024-11-13 DIAGNOSIS — E7849 Other hyperlipidemia: Secondary | ICD-10-CM

## 2024-11-13 DIAGNOSIS — Z8719 Personal history of other diseases of the digestive system: Secondary | ICD-10-CM

## 2024-11-13 DIAGNOSIS — G3184 Mild cognitive impairment, so stated: Secondary | ICD-10-CM

## 2024-11-13 DIAGNOSIS — E669 Obesity, unspecified: Secondary | ICD-10-CM

## 2024-11-13 DIAGNOSIS — R7303 Prediabetes: Secondary | ICD-10-CM

## 2024-11-13 DIAGNOSIS — F22 Delusional disorders: Secondary | ICD-10-CM

## 2024-11-13 DIAGNOSIS — R632 Polyphagia: Secondary | ICD-10-CM

## 2024-11-13 DIAGNOSIS — R4189 Other symptoms and signs involving cognitive functions and awareness: Secondary | ICD-10-CM

## 2024-11-21 ENCOUNTER — Ambulatory Visit: Admitting: Dermatology

## 2024-11-24 ENCOUNTER — Telehealth

## 2024-12-11 ENCOUNTER — Ambulatory Visit (INDEPENDENT_AMBULATORY_CARE_PROVIDER_SITE_OTHER): Admitting: Physician Assistant

## 2024-12-15 ENCOUNTER — Encounter (HOSPITAL_COMMUNITY): Admitting: Psychiatry
